# Patient Record
Sex: Female | Born: 1950 | Race: White | Hispanic: No | Marital: Married | Smoking: Former smoker
Health system: Southern US, Community
[De-identification: ages and names within clinical notes are randomized; demographics above are authoritative.]

## PROBLEM LIST (undated history)

## (undated) DIAGNOSIS — K219 Gastro-esophageal reflux disease without esophagitis: Secondary | ICD-10-CM

## (undated) DIAGNOSIS — I2510 Atherosclerotic heart disease of native coronary artery without angina pectoris: Secondary | ICD-10-CM

## (undated) DIAGNOSIS — F32A Depression, unspecified: Secondary | ICD-10-CM

## (undated) DIAGNOSIS — M51369 Other intervertebral disc degeneration, lumbar region without mention of lumbar back pain or lower extremity pain: Secondary | ICD-10-CM

## (undated) DIAGNOSIS — I700 Atherosclerosis of aorta: Secondary | ICD-10-CM

## (undated) DIAGNOSIS — F419 Anxiety disorder, unspecified: Secondary | ICD-10-CM

## (undated) DIAGNOSIS — I10 Essential (primary) hypertension: Secondary | ICD-10-CM

## (undated) DIAGNOSIS — E119 Type 2 diabetes mellitus without complications: Secondary | ICD-10-CM

## (undated) DIAGNOSIS — M5136 Other intervertebral disc degeneration, lumbar region: Secondary | ICD-10-CM

## (undated) DIAGNOSIS — R0600 Dyspnea, unspecified: Secondary | ICD-10-CM

## (undated) DIAGNOSIS — E669 Obesity, unspecified: Secondary | ICD-10-CM

## (undated) DIAGNOSIS — J45909 Unspecified asthma, uncomplicated: Secondary | ICD-10-CM

## (undated) DIAGNOSIS — C801 Malignant (primary) neoplasm, unspecified: Secondary | ICD-10-CM

## (undated) DIAGNOSIS — E785 Hyperlipidemia, unspecified: Secondary | ICD-10-CM

## (undated) HISTORY — PX: TONSILLECTOMY: SUR1361

## (undated) HISTORY — PX: BACK SURGERY: SHX140

---

## 2006-04-11 ENCOUNTER — Emergency Department: Payer: Self-pay | Admitting: Emergency Medicine

## 2006-04-13 ENCOUNTER — Ambulatory Visit: Payer: Self-pay | Admitting: Internal Medicine

## 2008-08-15 ENCOUNTER — Ambulatory Visit: Payer: Self-pay | Admitting: Internal Medicine

## 2008-08-26 ENCOUNTER — Ambulatory Visit: Payer: Self-pay | Admitting: Internal Medicine

## 2008-09-13 ENCOUNTER — Ambulatory Visit: Payer: Self-pay | Admitting: Neurology

## 2008-09-16 ENCOUNTER — Encounter: Admission: RE | Admit: 2008-09-16 | Discharge: 2008-09-16 | Payer: Self-pay | Admitting: Neurological Surgery

## 2008-09-19 ENCOUNTER — Ambulatory Visit (HOSPITAL_COMMUNITY): Admission: RE | Admit: 2008-09-19 | Discharge: 2008-09-20 | Payer: Self-pay | Admitting: Neurological Surgery

## 2008-10-08 ENCOUNTER — Encounter: Admission: RE | Admit: 2008-10-08 | Discharge: 2008-10-08 | Payer: Self-pay | Admitting: Neurological Surgery

## 2008-10-29 ENCOUNTER — Encounter: Admission: RE | Admit: 2008-10-29 | Discharge: 2008-10-29 | Payer: Self-pay | Admitting: Neurological Surgery

## 2008-11-26 ENCOUNTER — Encounter: Admission: RE | Admit: 2008-11-26 | Discharge: 2008-11-26 | Payer: Self-pay | Admitting: Neurological Surgery

## 2009-03-11 ENCOUNTER — Encounter: Admission: RE | Admit: 2009-03-11 | Discharge: 2009-03-11 | Payer: Self-pay | Admitting: Neurological Surgery

## 2009-06-23 ENCOUNTER — Encounter: Admission: RE | Admit: 2009-06-23 | Discharge: 2009-06-23 | Payer: Self-pay | Admitting: Neurological Surgery

## 2009-11-10 ENCOUNTER — Encounter: Admission: RE | Admit: 2009-11-10 | Discharge: 2009-11-10 | Payer: Self-pay | Admitting: Neurological Surgery

## 2011-02-16 NOTE — Op Note (Signed)
Olivia Buchanan, Olivia Buchanan                ACCOUNT NO.:  [id number]   MEDICAL RECORD NO.:  [id number]          PATIENT TYPE:  INP   LOCATION:  3101                         FACILITY:  [facility]   PHYSICIAN:  Tia Alert, MD     DATE OF BIRTH:  1951/04/17   DATE OF PROCEDURE:  09/19/2008  DATE OF DISCHARGE:                               OPERATIVE REPORT   PREOPERATIVE DIAGNOSIS:  Cervical spondylosis with cervical spinal  stenosis at C3-4, C4-5, and C5-6 with cord compression and myelopathy.   POSTOPERATIVE DIAGNOSIS:  Cervical spondylosis with cervical spinal  stenosis at C3-4, C4-5, and C5-6 with cord compression and myelopathy.   PROCEDURE:  1. Decompressive anterior cervical diskectomy at C3-4 for central      canal decompression.  2. C5 corpectomy after C4-5 and C5-6 diskectomies for central canal      decompression.  3. Anterior cervical arthrodesis at C3-4 utilizing a 8-mm Peak      interbody cage packed with local autograft and Actifuse putty.  4. Anterior cervical arthrodesis at C4-C6 utilizing a 24-mm carbon      fiber cage packed with local autograft and Actifuse putty.  5. Anterior cervical plating at C3-C6 utilizing an Atlantis      translational plate with anterior cervical screws in C3, C4, and C6  6. Microdissection during the anterior cervical diskectomy and      corpectomy.   SURGEON:  Tia Alert, MD   ASSISTANT:  Reinaldo Meeker, MD   ANESTHESIA:  General endotracheal.   COMPLICATIONS:  None apparent.   INDICATIONS FOR PROCEDURE:  Ms. Olivia Buchanan is a 60 year old female who was  referred with severe myelopathy.  She had a 79-month history of  progressive weakness in her hands with numbness in her hands, pain in  her neck, and difficulty with gait.  She was found to be hyperreflexic  and severely myelopathic with a severe spastic gait.  She had an MRI  which showed a large midline disk herniation at C3-4 with a large disk  herniation at C4-5 and C5-6 with compression  from the C4-5 disk space  all the way down to the C5-6 disk space behind the body of C5.  She had  some subluxed C3 on C4 and kyphotic angulation at those levels.  I  recommended anterior cervical diskectomy, fusion, and plating at C3-4  followed by corpectomy at C5.  She understood the risks, benefits, and  expected outcome and wished to proceed.   DESCRIPTION OF PROCEDURE:  The patient was taken to the operating room  and after induction of adequate general endotracheal anesthesia, she was  placed in the supine position on the operating room table.  Her right  anterior cervical region was prepped with DuraPrep and draped in the  usual sterile fashion.  A 5 mL of local anesthesia was injected and a  transverse incision was made to the right of midline and carried down to  the platysma which was elevated, opened, and undermined with Metzenbaum  scissors.  I then dissected a plane medial to the sternocleidomastoid  muscle and internal  carotid artery and lateral to the trachea and  esophagus to expose C3-4, C4-5, and C5-6.  Intraoperative fluoroscopy  confirmed my levels and the longus colli muscles were taken down and the  shadow line retractors were placed under this to expose C3-4, C4-5, and  C5-6.  I confirmed my levels once again and then incised the disk space  at C3-4 and did the initial diskectomy with pituitary rongeurs and  curved curettes.  I drilled down to the level of the posterior  longitudinal ligament saving the drill shavings and a mucous trap for  later arthrodesis.  The operating microscope was brought to the field  and the posterior longitudinal ligament was opened.  There was a large  midline disk herniation that was removed with a nerve hook and pituitary  rongeurs.  The posterior longitudinal ligament was opened and removed in  a circumferential fashion undercutting the bodies of C3 and C4.  Once  the dura was no longer pushed away from [place], we could see the cord   pulsatile through the dura.  I palpated it in a circumferential fashion  with a nerve hook to assure adequate decompression of the central canal.  The dura was full and capacious all the way across.  Therefore, I used  an 8-mm Peak interbody cage packed local autograft and Actifuse putty  and tapped this into position at C3-4.  I then turned my attention to  the corpectomy.  The disk spaces were incised at C4-5 and C5-6 and the  initial diskectomies done with care of curettes and pituitary rongeurs.  I then drilled the endplates down to the level of the posterior  longitudinal ligament at each level.  I then removed the anterior part  of the vertebral body with a Leksell rongeur and then used the high-  speed drill to completely drill out the remainder of the vertebral body  saving the drillings and a mucous trap for later arthrodesis.  I was  able to open the posterior longitudinal ligament and remove it while  removing the posterior part of the vertebral body of C5.  I was able to  undercut the endplates of C4 and C6.  There was a large disk herniation  at C5-6 with a moderate large disk herniation at C4-5.  I identified the  nerve roots bilaterally.  Bilateral foraminotomies were performed.  The  central canals were well decompressed.  You could see the cord pulsatile  through the dura and the dura was full and capacious all the way across  from the C4 vertebral body all the way down to C6.  I palpated it in a  circumferential fashion utilizing a nerve hook and therefore with both  visualization and palpation we felt like we had a good decompression of  the central canal at C4-5 and C5-6 and behind where the vertebral body  of C5 had been.  Therefore, I irrigated with saline solution and dried  the surgical bed.  I measured the interspace to be 24 mm and used a 24-  mm carbon cage, packed this with local autograft and Actifuse putty and  tapped it in position at C4-C6.  I then used an  Atlantis translational  plate.  I closed the lower 2 parts of the plate 1 mm so we only achieved  1 mm of translation.  I then placed the plate.  I placed two 30-mm  variable angle screws in the bodies of C6 and then the C3 and then the  C4 and then locked these into position with locking mechanism on the  plate.  I then irrigated with saline solution containing bacitracin,  dried all bleeding points with bipolar cautery and with Surgifoam,  irrigated again and once meticulous hemostasis was achieved, I placed a  #7 flat Blake drain through a separate stab incision and then closed the  platysma with 3-0 Vicryl, closed the subcuticular tissue with 3-0  Vicryl, and closed the skin with benzoin and Steri-Strips.  The drapes  were removed.  Sterile dressing was applied.  The patient was awakened  from general anesthesia and transferred to the recovery room in stable  condition.  At the end of the procedure, all sponge, needle, and  instrument counts were correct.      Tia Alert, MD  Electronically Signed     DSJ/MEDQ  D:  09/19/2008  T:  09/20/2008  Job:  [id number]

## 2011-07-09 LAB — URINALYSIS, ROUTINE W REFLEX MICROSCOPIC
Hgb urine dipstick: NEGATIVE
Nitrite: NEGATIVE
Protein, ur: NEGATIVE mg/dL
Specific Gravity, Urine: 1.027 (ref 1.005–1.030)
pH: 5.5 (ref 5.0–8.0)

## 2011-07-09 LAB — URINE CULTURE: Colony Count: >=100000

## 2011-07-09 LAB — URINE MICROSCOPIC-ADD ON

## 2011-07-09 LAB — CBC
Hemoglobin: 15.3 g/dL — ABNORMAL HIGH (ref 12.0–15.0)
MCV: 96.5 fL (ref 78.0–100.0)
Platelets: 186 10*3/uL (ref 150–400)
RBC: 4.71 MIL/uL (ref 3.87–5.11)
RDW: 13.3 % (ref 11.5–15.5)

## 2011-07-09 LAB — BASIC METABOLIC PANEL
CO2: 26 mEq/L (ref 19–32)
Potassium: 4.7 mEq/L (ref 3.5–5.1)
Sodium: 141 mEq/L (ref 135–145)

## 2011-07-09 LAB — APTT: aPTT: 26 seconds (ref 24–37)

## 2011-07-09 LAB — DIFFERENTIAL
Basophils Relative: 1 % (ref 0–1)
Lymphocytes Relative: 32 % (ref 12–46)
Neutro Abs: 5.4 10*3/uL (ref 1.7–7.7)
Neutrophils Relative %: 58 % (ref 43–77)

## 2011-07-09 LAB — PROTIME-INR
INR: 0.9 (ref 0.00–1.49)
Prothrombin Time: 12.2 seconds (ref 11.6–15.2)

## 2014-12-18 DIAGNOSIS — M549 Dorsalgia, unspecified: Secondary | ICD-10-CM | POA: Diagnosis not present

## 2014-12-18 DIAGNOSIS — F419 Anxiety disorder, unspecified: Secondary | ICD-10-CM | POA: Diagnosis not present

## 2014-12-18 DIAGNOSIS — E119 Type 2 diabetes mellitus without complications: Secondary | ICD-10-CM | POA: Diagnosis not present

## 2014-12-18 DIAGNOSIS — Z79899 Other long term (current) drug therapy: Secondary | ICD-10-CM | POA: Diagnosis not present

## 2014-12-18 DIAGNOSIS — F3341 Major depressive disorder, recurrent, in partial remission: Secondary | ICD-10-CM | POA: Diagnosis not present

## 2014-12-18 DIAGNOSIS — J449 Chronic obstructive pulmonary disease, unspecified: Secondary | ICD-10-CM | POA: Diagnosis not present

## 2014-12-18 DIAGNOSIS — E780 Pure hypercholesterolemia: Secondary | ICD-10-CM | POA: Diagnosis not present

## 2014-12-18 DIAGNOSIS — G8929 Other chronic pain: Secondary | ICD-10-CM | POA: Diagnosis not present

## 2015-02-04 DIAGNOSIS — Z01419 Encounter for gynecological examination (general) (routine) without abnormal findings: Secondary | ICD-10-CM | POA: Diagnosis not present

## 2015-02-11 DIAGNOSIS — N63 Unspecified lump in breast: Secondary | ICD-10-CM | POA: Diagnosis not present

## 2015-03-21 DIAGNOSIS — Z79899 Other long term (current) drug therapy: Secondary | ICD-10-CM | POA: Diagnosis not present

## 2015-03-21 DIAGNOSIS — E119 Type 2 diabetes mellitus without complications: Secondary | ICD-10-CM | POA: Diagnosis not present

## 2015-03-21 DIAGNOSIS — E780 Pure hypercholesterolemia: Secondary | ICD-10-CM | POA: Diagnosis not present

## 2015-03-21 DIAGNOSIS — S30861A Insect bite (nonvenomous) of abdominal wall, initial encounter: Secondary | ICD-10-CM | POA: Diagnosis not present

## 2015-03-21 DIAGNOSIS — J449 Chronic obstructive pulmonary disease, unspecified: Secondary | ICD-10-CM | POA: Diagnosis not present

## 2015-07-03 DIAGNOSIS — J431 Panlobular emphysema: Secondary | ICD-10-CM | POA: Diagnosis not present

## 2015-07-03 DIAGNOSIS — M549 Dorsalgia, unspecified: Secondary | ICD-10-CM | POA: Diagnosis not present

## 2015-07-03 DIAGNOSIS — Z1211 Encounter for screening for malignant neoplasm of colon: Secondary | ICD-10-CM | POA: Diagnosis not present

## 2015-07-03 DIAGNOSIS — K219 Gastro-esophageal reflux disease without esophagitis: Secondary | ICD-10-CM | POA: Diagnosis not present

## 2015-07-03 DIAGNOSIS — J209 Acute bronchitis, unspecified: Secondary | ICD-10-CM | POA: Diagnosis not present

## 2015-07-03 DIAGNOSIS — Z79899 Other long term (current) drug therapy: Secondary | ICD-10-CM | POA: Diagnosis not present

## 2015-07-03 DIAGNOSIS — E119 Type 2 diabetes mellitus without complications: Secondary | ICD-10-CM | POA: Diagnosis not present

## 2015-07-03 DIAGNOSIS — E780 Pure hypercholesterolemia: Secondary | ICD-10-CM | POA: Diagnosis not present

## 2015-10-02 DIAGNOSIS — E119 Type 2 diabetes mellitus without complications: Secondary | ICD-10-CM | POA: Diagnosis not present

## 2015-10-02 DIAGNOSIS — Z79899 Other long term (current) drug therapy: Secondary | ICD-10-CM | POA: Diagnosis not present

## 2015-10-02 DIAGNOSIS — M549 Dorsalgia, unspecified: Secondary | ICD-10-CM | POA: Diagnosis not present

## 2015-10-02 DIAGNOSIS — M545 Low back pain: Secondary | ICD-10-CM | POA: Diagnosis not present

## 2015-10-02 DIAGNOSIS — E7800 Pure hypercholesterolemia, unspecified: Secondary | ICD-10-CM | POA: Diagnosis not present

## 2015-10-02 DIAGNOSIS — G8929 Other chronic pain: Secondary | ICD-10-CM | POA: Diagnosis not present

## 2015-10-02 DIAGNOSIS — J431 Panlobular emphysema: Secondary | ICD-10-CM | POA: Diagnosis not present

## 2015-10-02 DIAGNOSIS — F419 Anxiety disorder, unspecified: Secondary | ICD-10-CM | POA: Diagnosis not present

## 2015-12-31 DIAGNOSIS — F419 Anxiety disorder, unspecified: Secondary | ICD-10-CM | POA: Diagnosis not present

## 2015-12-31 DIAGNOSIS — E7800 Pure hypercholesterolemia, unspecified: Secondary | ICD-10-CM | POA: Diagnosis not present

## 2015-12-31 DIAGNOSIS — E119 Type 2 diabetes mellitus without complications: Secondary | ICD-10-CM | POA: Diagnosis not present

## 2015-12-31 DIAGNOSIS — J431 Panlobular emphysema: Secondary | ICD-10-CM | POA: Diagnosis not present

## 2015-12-31 DIAGNOSIS — M545 Low back pain: Secondary | ICD-10-CM | POA: Diagnosis not present

## 2015-12-31 DIAGNOSIS — I10 Essential (primary) hypertension: Secondary | ICD-10-CM | POA: Diagnosis not present

## 2015-12-31 DIAGNOSIS — M549 Dorsalgia, unspecified: Secondary | ICD-10-CM | POA: Diagnosis not present

## 2015-12-31 DIAGNOSIS — Z79899 Other long term (current) drug therapy: Secondary | ICD-10-CM | POA: Diagnosis not present

## 2015-12-31 DIAGNOSIS — G8929 Other chronic pain: Secondary | ICD-10-CM | POA: Diagnosis not present

## 2016-04-05 DIAGNOSIS — G8929 Other chronic pain: Secondary | ICD-10-CM | POA: Diagnosis not present

## 2016-04-05 DIAGNOSIS — F329 Major depressive disorder, single episode, unspecified: Secondary | ICD-10-CM | POA: Diagnosis not present

## 2016-04-05 DIAGNOSIS — J431 Panlobular emphysema: Secondary | ICD-10-CM | POA: Diagnosis not present

## 2016-04-05 DIAGNOSIS — M549 Dorsalgia, unspecified: Secondary | ICD-10-CM | POA: Diagnosis not present

## 2016-04-05 DIAGNOSIS — M545 Low back pain: Secondary | ICD-10-CM | POA: Diagnosis not present

## 2016-04-05 DIAGNOSIS — E119 Type 2 diabetes mellitus without complications: Secondary | ICD-10-CM | POA: Diagnosis not present

## 2016-04-05 DIAGNOSIS — F419 Anxiety disorder, unspecified: Secondary | ICD-10-CM | POA: Diagnosis not present

## 2016-04-05 DIAGNOSIS — E7800 Pure hypercholesterolemia, unspecified: Secondary | ICD-10-CM | POA: Diagnosis not present

## 2016-07-05 DIAGNOSIS — R8290 Unspecified abnormal findings in urine: Secondary | ICD-10-CM | POA: Diagnosis not present

## 2016-07-05 DIAGNOSIS — E119 Type 2 diabetes mellitus without complications: Secondary | ICD-10-CM | POA: Diagnosis not present

## 2016-07-05 DIAGNOSIS — E7800 Pure hypercholesterolemia, unspecified: Secondary | ICD-10-CM | POA: Diagnosis not present

## 2016-07-05 DIAGNOSIS — Z79899 Other long term (current) drug therapy: Secondary | ICD-10-CM | POA: Diagnosis not present

## 2016-07-08 DIAGNOSIS — Z1231 Encounter for screening mammogram for malignant neoplasm of breast: Secondary | ICD-10-CM | POA: Diagnosis not present

## 2016-07-08 DIAGNOSIS — M545 Low back pain: Secondary | ICD-10-CM | POA: Diagnosis not present

## 2016-07-08 DIAGNOSIS — F419 Anxiety disorder, unspecified: Secondary | ICD-10-CM | POA: Diagnosis not present

## 2016-07-08 DIAGNOSIS — M549 Dorsalgia, unspecified: Secondary | ICD-10-CM | POA: Diagnosis not present

## 2016-07-08 DIAGNOSIS — J449 Chronic obstructive pulmonary disease, unspecified: Secondary | ICD-10-CM | POA: Diagnosis not present

## 2016-07-08 DIAGNOSIS — Z0000 Encounter for general adult medical examination without abnormal findings: Secondary | ICD-10-CM | POA: Diagnosis not present

## 2016-07-08 DIAGNOSIS — E7800 Pure hypercholesterolemia, unspecified: Secondary | ICD-10-CM | POA: Diagnosis not present

## 2016-07-08 DIAGNOSIS — E119 Type 2 diabetes mellitus without complications: Secondary | ICD-10-CM | POA: Diagnosis not present

## 2016-07-08 DIAGNOSIS — J209 Acute bronchitis, unspecified: Secondary | ICD-10-CM | POA: Diagnosis not present

## 2016-07-08 DIAGNOSIS — J431 Panlobular emphysema: Secondary | ICD-10-CM | POA: Diagnosis not present

## 2016-07-19 DIAGNOSIS — Z1211 Encounter for screening for malignant neoplasm of colon: Secondary | ICD-10-CM | POA: Diagnosis not present

## 2016-10-06 DIAGNOSIS — E119 Type 2 diabetes mellitus without complications: Secondary | ICD-10-CM | POA: Diagnosis not present

## 2016-10-06 DIAGNOSIS — F3341 Major depressive disorder, recurrent, in partial remission: Secondary | ICD-10-CM | POA: Diagnosis not present

## 2016-10-06 DIAGNOSIS — F419 Anxiety disorder, unspecified: Secondary | ICD-10-CM | POA: Diagnosis not present

## 2016-10-06 DIAGNOSIS — G8929 Other chronic pain: Secondary | ICD-10-CM | POA: Diagnosis not present

## 2016-10-06 DIAGNOSIS — J431 Panlobular emphysema: Secondary | ICD-10-CM | POA: Diagnosis not present

## 2016-10-06 DIAGNOSIS — E7800 Pure hypercholesterolemia, unspecified: Secondary | ICD-10-CM | POA: Diagnosis not present

## 2016-10-06 DIAGNOSIS — F418 Other specified anxiety disorders: Secondary | ICD-10-CM | POA: Diagnosis not present

## 2016-10-06 DIAGNOSIS — M545 Low back pain: Secondary | ICD-10-CM | POA: Diagnosis not present

## 2016-10-06 DIAGNOSIS — M549 Dorsalgia, unspecified: Secondary | ICD-10-CM | POA: Diagnosis not present

## 2017-01-20 DIAGNOSIS — G8929 Other chronic pain: Secondary | ICD-10-CM | POA: Diagnosis not present

## 2017-01-20 DIAGNOSIS — E7800 Pure hypercholesterolemia, unspecified: Secondary | ICD-10-CM | POA: Diagnosis not present

## 2017-01-20 DIAGNOSIS — M545 Low back pain: Secondary | ICD-10-CM | POA: Diagnosis not present

## 2017-01-20 DIAGNOSIS — J431 Panlobular emphysema: Secondary | ICD-10-CM | POA: Diagnosis not present

## 2017-01-20 DIAGNOSIS — E119 Type 2 diabetes mellitus without complications: Secondary | ICD-10-CM | POA: Diagnosis not present

## 2017-01-20 DIAGNOSIS — M549 Dorsalgia, unspecified: Secondary | ICD-10-CM | POA: Diagnosis not present

## 2017-01-20 DIAGNOSIS — Z79899 Other long term (current) drug therapy: Secondary | ICD-10-CM | POA: Diagnosis not present

## 2017-04-21 DIAGNOSIS — F419 Anxiety disorder, unspecified: Secondary | ICD-10-CM | POA: Diagnosis not present

## 2017-04-21 DIAGNOSIS — J431 Panlobular emphysema: Secondary | ICD-10-CM | POA: Diagnosis not present

## 2017-04-21 DIAGNOSIS — E7800 Pure hypercholesterolemia, unspecified: Secondary | ICD-10-CM | POA: Diagnosis not present

## 2017-04-21 DIAGNOSIS — E119 Type 2 diabetes mellitus without complications: Secondary | ICD-10-CM | POA: Diagnosis not present

## 2017-04-21 DIAGNOSIS — G8929 Other chronic pain: Secondary | ICD-10-CM | POA: Diagnosis not present

## 2017-04-21 DIAGNOSIS — Z0000 Encounter for general adult medical examination without abnormal findings: Secondary | ICD-10-CM | POA: Diagnosis not present

## 2017-04-21 DIAGNOSIS — M545 Low back pain: Secondary | ICD-10-CM | POA: Diagnosis not present

## 2017-04-21 DIAGNOSIS — F3341 Major depressive disorder, recurrent, in partial remission: Secondary | ICD-10-CM | POA: Diagnosis not present

## 2017-05-31 DIAGNOSIS — J431 Panlobular emphysema: Secondary | ICD-10-CM | POA: Diagnosis not present

## 2017-05-31 DIAGNOSIS — G8929 Other chronic pain: Secondary | ICD-10-CM | POA: Diagnosis not present

## 2017-05-31 DIAGNOSIS — F3341 Major depressive disorder, recurrent, in partial remission: Secondary | ICD-10-CM | POA: Diagnosis not present

## 2017-05-31 DIAGNOSIS — E7800 Pure hypercholesterolemia, unspecified: Secondary | ICD-10-CM | POA: Diagnosis not present

## 2017-05-31 DIAGNOSIS — M545 Low back pain: Secondary | ICD-10-CM | POA: Diagnosis not present

## 2017-05-31 DIAGNOSIS — E119 Type 2 diabetes mellitus without complications: Secondary | ICD-10-CM | POA: Diagnosis not present

## 2017-05-31 DIAGNOSIS — Z79899 Other long term (current) drug therapy: Secondary | ICD-10-CM | POA: Diagnosis not present

## 2017-05-31 DIAGNOSIS — Z1231 Encounter for screening mammogram for malignant neoplasm of breast: Secondary | ICD-10-CM | POA: Diagnosis not present

## 2017-05-31 DIAGNOSIS — F419 Anxiety disorder, unspecified: Secondary | ICD-10-CM | POA: Diagnosis not present

## 2017-07-27 DIAGNOSIS — Z1231 Encounter for screening mammogram for malignant neoplasm of breast: Secondary | ICD-10-CM | POA: Diagnosis not present

## 2017-08-29 DIAGNOSIS — E7800 Pure hypercholesterolemia, unspecified: Secondary | ICD-10-CM | POA: Diagnosis not present

## 2017-08-29 DIAGNOSIS — Z79899 Other long term (current) drug therapy: Secondary | ICD-10-CM | POA: Diagnosis not present

## 2017-08-29 DIAGNOSIS — E119 Type 2 diabetes mellitus without complications: Secondary | ICD-10-CM | POA: Diagnosis not present

## 2017-08-31 DIAGNOSIS — M545 Low back pain: Secondary | ICD-10-CM | POA: Diagnosis not present

## 2017-08-31 DIAGNOSIS — E119 Type 2 diabetes mellitus without complications: Secondary | ICD-10-CM | POA: Diagnosis not present

## 2017-08-31 DIAGNOSIS — G8929 Other chronic pain: Secondary | ICD-10-CM | POA: Diagnosis not present

## 2017-08-31 DIAGNOSIS — J431 Panlobular emphysema: Secondary | ICD-10-CM | POA: Diagnosis not present

## 2017-08-31 DIAGNOSIS — E7800 Pure hypercholesterolemia, unspecified: Secondary | ICD-10-CM | POA: Diagnosis not present

## 2017-12-01 DIAGNOSIS — I10 Essential (primary) hypertension: Secondary | ICD-10-CM | POA: Diagnosis not present

## 2017-12-01 DIAGNOSIS — E119 Type 2 diabetes mellitus without complications: Secondary | ICD-10-CM | POA: Diagnosis not present

## 2017-12-01 DIAGNOSIS — M545 Low back pain: Secondary | ICD-10-CM | POA: Diagnosis not present

## 2017-12-01 DIAGNOSIS — K219 Gastro-esophageal reflux disease without esophagitis: Secondary | ICD-10-CM | POA: Diagnosis not present

## 2017-12-01 DIAGNOSIS — F419 Anxiety disorder, unspecified: Secondary | ICD-10-CM | POA: Diagnosis not present

## 2017-12-01 DIAGNOSIS — Z0000 Encounter for general adult medical examination without abnormal findings: Secondary | ICD-10-CM | POA: Diagnosis not present

## 2017-12-01 DIAGNOSIS — M549 Dorsalgia, unspecified: Secondary | ICD-10-CM | POA: Diagnosis not present

## 2017-12-01 DIAGNOSIS — J431 Panlobular emphysema: Secondary | ICD-10-CM | POA: Diagnosis not present

## 2017-12-01 DIAGNOSIS — E7800 Pure hypercholesterolemia, unspecified: Secondary | ICD-10-CM | POA: Diagnosis not present

## 2018-02-28 DIAGNOSIS — M549 Dorsalgia, unspecified: Secondary | ICD-10-CM | POA: Diagnosis not present

## 2018-02-28 DIAGNOSIS — E7800 Pure hypercholesterolemia, unspecified: Secondary | ICD-10-CM | POA: Diagnosis not present

## 2018-02-28 DIAGNOSIS — G8929 Other chronic pain: Secondary | ICD-10-CM | POA: Diagnosis not present

## 2018-02-28 DIAGNOSIS — Z79899 Other long term (current) drug therapy: Secondary | ICD-10-CM | POA: Diagnosis not present

## 2018-02-28 DIAGNOSIS — M545 Low back pain: Secondary | ICD-10-CM | POA: Diagnosis not present

## 2018-02-28 DIAGNOSIS — E119 Type 2 diabetes mellitus without complications: Secondary | ICD-10-CM | POA: Diagnosis not present

## 2018-02-28 DIAGNOSIS — Z1211 Encounter for screening for malignant neoplasm of colon: Secondary | ICD-10-CM | POA: Diagnosis not present

## 2018-02-28 DIAGNOSIS — J449 Chronic obstructive pulmonary disease, unspecified: Secondary | ICD-10-CM | POA: Diagnosis not present

## 2018-02-28 DIAGNOSIS — J431 Panlobular emphysema: Secondary | ICD-10-CM | POA: Diagnosis not present

## 2018-05-31 DIAGNOSIS — J431 Panlobular emphysema: Secondary | ICD-10-CM | POA: Diagnosis not present

## 2018-05-31 DIAGNOSIS — Z79899 Other long term (current) drug therapy: Secondary | ICD-10-CM | POA: Diagnosis not present

## 2018-05-31 DIAGNOSIS — G8929 Other chronic pain: Secondary | ICD-10-CM | POA: Diagnosis not present

## 2018-05-31 DIAGNOSIS — E119 Type 2 diabetes mellitus without complications: Secondary | ICD-10-CM | POA: Diagnosis not present

## 2018-05-31 DIAGNOSIS — M545 Low back pain: Secondary | ICD-10-CM | POA: Diagnosis not present

## 2018-05-31 DIAGNOSIS — E7800 Pure hypercholesterolemia, unspecified: Secondary | ICD-10-CM | POA: Diagnosis not present

## 2018-08-30 DIAGNOSIS — Z79899 Other long term (current) drug therapy: Secondary | ICD-10-CM | POA: Diagnosis not present

## 2018-08-30 DIAGNOSIS — E119 Type 2 diabetes mellitus without complications: Secondary | ICD-10-CM | POA: Diagnosis not present

## 2018-08-30 DIAGNOSIS — G8929 Other chronic pain: Secondary | ICD-10-CM | POA: Diagnosis not present

## 2018-08-30 DIAGNOSIS — E7800 Pure hypercholesterolemia, unspecified: Secondary | ICD-10-CM | POA: Diagnosis not present

## 2018-08-30 DIAGNOSIS — M545 Low back pain: Secondary | ICD-10-CM | POA: Diagnosis not present

## 2018-08-30 DIAGNOSIS — J431 Panlobular emphysema: Secondary | ICD-10-CM | POA: Diagnosis not present

## 2019-03-16 DIAGNOSIS — Z0000 Encounter for general adult medical examination without abnormal findings: Secondary | ICD-10-CM | POA: Diagnosis not present

## 2019-03-16 DIAGNOSIS — I10 Essential (primary) hypertension: Secondary | ICD-10-CM | POA: Diagnosis not present

## 2019-03-16 DIAGNOSIS — E1165 Type 2 diabetes mellitus with hyperglycemia: Secondary | ICD-10-CM | POA: Diagnosis not present

## 2019-03-16 DIAGNOSIS — F419 Anxiety disorder, unspecified: Secondary | ICD-10-CM | POA: Diagnosis not present

## 2019-03-16 DIAGNOSIS — J431 Panlobular emphysema: Secondary | ICD-10-CM | POA: Diagnosis not present

## 2019-03-16 DIAGNOSIS — Z79899 Other long term (current) drug therapy: Secondary | ICD-10-CM | POA: Diagnosis not present

## 2019-03-16 DIAGNOSIS — E7800 Pure hypercholesterolemia, unspecified: Secondary | ICD-10-CM | POA: Diagnosis not present

## 2019-03-16 DIAGNOSIS — M549 Dorsalgia, unspecified: Secondary | ICD-10-CM | POA: Diagnosis not present

## 2019-03-16 DIAGNOSIS — Z1211 Encounter for screening for malignant neoplasm of colon: Secondary | ICD-10-CM | POA: Diagnosis not present

## 2019-07-05 DIAGNOSIS — E119 Type 2 diabetes mellitus without complications: Secondary | ICD-10-CM | POA: Diagnosis not present

## 2019-07-05 DIAGNOSIS — Z135 Encounter for screening for eye and ear disorders: Secondary | ICD-10-CM | POA: Diagnosis not present

## 2019-07-05 DIAGNOSIS — Z79899 Other long term (current) drug therapy: Secondary | ICD-10-CM | POA: Diagnosis not present

## 2019-07-05 DIAGNOSIS — I10 Essential (primary) hypertension: Secondary | ICD-10-CM | POA: Diagnosis not present

## 2019-07-05 DIAGNOSIS — E785 Hyperlipidemia, unspecified: Secondary | ICD-10-CM | POA: Diagnosis not present

## 2019-07-05 DIAGNOSIS — R413 Other amnesia: Secondary | ICD-10-CM | POA: Diagnosis not present

## 2019-07-05 DIAGNOSIS — Z0000 Encounter for general adult medical examination without abnormal findings: Secondary | ICD-10-CM | POA: Diagnosis not present

## 2019-10-19 DIAGNOSIS — J449 Chronic obstructive pulmonary disease, unspecified: Secondary | ICD-10-CM | POA: Diagnosis not present

## 2019-10-19 DIAGNOSIS — I10 Essential (primary) hypertension: Secondary | ICD-10-CM | POA: Diagnosis not present

## 2019-10-19 DIAGNOSIS — Z0000 Encounter for general adult medical examination without abnormal findings: Secondary | ICD-10-CM | POA: Diagnosis not present

## 2019-10-19 DIAGNOSIS — E119 Type 2 diabetes mellitus without complications: Secondary | ICD-10-CM | POA: Diagnosis not present

## 2019-10-19 DIAGNOSIS — E785 Hyperlipidemia, unspecified: Secondary | ICD-10-CM | POA: Diagnosis not present

## 2019-10-19 DIAGNOSIS — Z79899 Other long term (current) drug therapy: Secondary | ICD-10-CM | POA: Diagnosis not present

## 2019-12-07 ENCOUNTER — Other Ambulatory Visit: Payer: Self-pay

## 2019-12-07 ENCOUNTER — Observation Stay
Admission: EM | Admit: 2019-12-07 | Discharge: 2019-12-08 | Disposition: A | Discharge status: Home / Self Care | Payer: Medicare HMO | Attending: Internal Medicine | Admitting: Internal Medicine

## 2019-12-07 ENCOUNTER — Emergency Department: Payer: Medicare HMO

## 2019-12-07 DIAGNOSIS — X58XXXA Exposure to other specified factors, initial encounter: Secondary | ICD-10-CM | POA: Diagnosis not present

## 2019-12-07 DIAGNOSIS — M542 Cervicalgia: Secondary | ICD-10-CM | POA: Diagnosis not present

## 2019-12-07 DIAGNOSIS — Z20822 Contact with and (suspected) exposure to covid-19: Secondary | ICD-10-CM | POA: Insufficient documentation

## 2019-12-07 DIAGNOSIS — J449 Chronic obstructive pulmonary disease, unspecified: Secondary | ICD-10-CM | POA: Insufficient documentation

## 2019-12-07 DIAGNOSIS — Z87891 Personal history of nicotine dependence: Secondary | ICD-10-CM | POA: Insufficient documentation

## 2019-12-07 DIAGNOSIS — T782XXA Anaphylactic shock, unspecified, initial encounter: Secondary | ICD-10-CM | POA: Diagnosis not present

## 2019-12-07 DIAGNOSIS — Z79899 Other long term (current) drug therapy: Secondary | ICD-10-CM | POA: Diagnosis not present

## 2019-12-07 DIAGNOSIS — R0902 Hypoxemia: Secondary | ICD-10-CM | POA: Diagnosis not present

## 2019-12-07 DIAGNOSIS — I10 Essential (primary) hypertension: Secondary | ICD-10-CM | POA: Diagnosis not present

## 2019-12-07 DIAGNOSIS — G8929 Other chronic pain: Secondary | ICD-10-CM | POA: Diagnosis not present

## 2019-12-07 DIAGNOSIS — M5136 Other intervertebral disc degeneration, lumbar region: Secondary | ICD-10-CM | POA: Insufficient documentation

## 2019-12-07 DIAGNOSIS — R609 Edema, unspecified: Secondary | ICD-10-CM | POA: Diagnosis not present

## 2019-12-07 DIAGNOSIS — E119 Type 2 diabetes mellitus without complications: Secondary | ICD-10-CM | POA: Diagnosis not present

## 2019-12-07 DIAGNOSIS — R1310 Dysphagia, unspecified: Secondary | ICD-10-CM | POA: Diagnosis not present

## 2019-12-07 DIAGNOSIS — T783XXA Angioneurotic edema, initial encounter: Secondary | ICD-10-CM | POA: Diagnosis not present

## 2019-12-07 DIAGNOSIS — R519 Headache, unspecified: Secondary | ICD-10-CM | POA: Diagnosis not present

## 2019-12-07 HISTORY — DX: Other intervertebral disc degeneration, lumbar region without mention of lumbar back pain or lower extremity pain: M51.369

## 2019-12-07 HISTORY — DX: Essential (primary) hypertension: I10

## 2019-12-07 HISTORY — DX: Other intervertebral disc degeneration, lumbar region: M51.36

## 2019-12-07 HISTORY — DX: Type 2 diabetes mellitus without complications: E11.9

## 2019-12-07 MED ORDER — EPINEPHRINE 0.3 MG/0.3ML IJ SOAJ
0.3000 mg | Freq: Once | INTRAMUSCULAR | Status: AC
  Administered 2019-12-07: 0.3 mg via INTRAMUSCULAR
  Filled 2019-12-07: qty 0.3

## 2019-12-07 MED ORDER — MORPHINE SULFATE (PF) 2 MG/ML IV SOLN
2.0000 mg | Freq: Once | INTRAVENOUS | Status: AC
  Administered 2019-12-07: 2 mg via INTRAVENOUS
  Filled 2019-12-07: qty 1

## 2019-12-07 NOTE — ED Triage Notes (Signed)
PT to ED via EMS from [place]. While eating pt had an allergic reaction to unknown  Allergen. PT had neck swelling and trouble breathing. PT was given 0.3mg  EPI 50mg  benadryl 125 solumedrol 20mg  famatodine  0.5mg  epi PT states her lips are "still a little bit" swollen, tongue "has gone down", but still a little trouble swallowing. PT talkingin full sentences.

## 2019-12-07 NOTE — ED Provider Notes (Signed)
[facility] Emergency Department Provider Note   ____________________________________________   First MD Initiated Contact with Patient 12/07/19 2116     (approximate)  I have reviewed the triage vital signs and the nursing notes.   HISTORY  Chief Complaint Allergic Reaction    HPI Olivia Buchanan is a 69 y.o. female presents for evaluation for concerning allergic reaction  Patient reports just prior to arrival she was eating at a [place], she suddenly began to experience swelling and difficulty swallowing.  She reports her face and tongue became very swollen.  They called EMS, she was administered 2 doses of epinephrine, IV steroid, IV Benadryl, and IV famotidine  She reports the swelling has improved, her breathing has improved, but she still feels like her tongue is somewhat swollen and it still difficult for her to swallow her face still does feel somewhat swollen as well  She denies trouble breathing at this time.  Reports she had significant trouble breathing when this occurred  She is not aware of any food allergies, but reports the same thing happened to her once before while actually dieting and eating at a [employer]  She does not take any "Prill" medications   Past Medical History:  Diagnosis Date  . DDD (degenerative disc disease), lumbar   . Diabetes mellitus without complication ([place])   . Hypertension     There are no problems to display for this patient.   Past Surgical History:  Procedure Laterality Date  . BACK SURGERY      Prior to Admission medications   Not on File    Allergies Macrobid [nitrofurantoin] and Penicillins  No family history on file.  Social History Social History   Tobacco Use  . Smoking status: Former Smoker    Years: 50.00    Quit date: 2019    Years since quitting: 2.1  . Smokeless tobacco: Never Used  Substance Use Topics  . Alcohol use: Never  . Drug use: Never     Review of Systems Constitutional: No fever/chills Eyes: No visual changes. ENT: See HPI Cardiovascular: Denies chest pain. Respiratory: Denies shortness of breath. Gastrointestinal: No abdominal pain.   Genitourinary: Negative for dysuria. Musculoskeletal: Negative for back pain. Skin: Negative for rash. Neurological: Negative for areas of focal weakness or numbness.  Reports that about the time the swelling started or after she got treatment by EMS, not quite sure she started having a severe throbbing headache.  Reports its not the worst headache she has ever had but it is pretty severe.  Bifrontal throbbing in nature.  No weakness, no visual changes.  ____________________________________________   PHYSICAL EXAM:  VITAL SIGNS: ED Triage Vitals  Enc Vitals Group     BP 12/07/19 2104 136/65     Pulse Rate 12/07/19 2104 83     Resp 12/07/19 2104 17     Temp 12/07/19 2104 97.9 F (36.6 C)     Temp Source 12/07/19 2104 Oral     SpO2 12/07/19 2051 95 %     Weight 12/07/19 2057 188 lb (85.3 kg)     Height 12/07/19 2057 5' (1.524 m)     Head Circumference --      Peak Flow --      Pain Score 12/07/19 2056 5     Pain Loc --      Pain Edu? --      Excl. in [place]? --     Constitutional: Alert and oriented. Well  appearing and in no acute distress. Eyes: Conjunctivae are normal. Head: Atraumatic but she does exhibit mild periorbital and perioral edema, some edema over the cheeks, her tongue appears just slightly edematous, she is handling her secretions and able to swallow but her voice does seem somewhat raspy.  She does not exhibit any obvious edema in the posterior oropharynx.. Nose: No congestion/rhinnorhea. Mouth/Throat: Mucous membranes are moist. Neck: No stridor.  Cardiovascular: Normal rate, regular rhythm. Grossly normal heart sounds.  Good peripheral circulation. Respiratory: Normal respiratory effort.  No retractions. Lungs CTAB. Gastrointestinal: Soft and nontender.  No distention. Musculoskeletal: No lower extremity tenderness nor edema. Neurologic:  Normal speech and language. No gross focal neurologic deficits are appreciated.  She reports a headache. Skin:  Skin is warm, dry and intact. No rash noted. Psychiatric: Mood and affect are normal. Speech and behavior are normal.  ____________________________________________   LABS (all labs ordered are listed, but only abnormal results are displayed)  Labs Reviewed  CBG MONITORING, ED   ____________________________________________  EKG  Reviewed entered by me at 2100 Heart rate 90 QRS 90 QTc 450 Normal sinus rhythm, mild artifact.  No evidence of acute ischemia ____________________________________________  RADIOLOGY  CT Head Wo Contrast  Result Date: 12/07/2019 CLINICAL DATA:  Headache. Headache, tension-type severe headache after epinephrine administration Allergic reaction with neck swelling and difficulty breathing. EXAM: CT HEAD WITHOUT CONTRAST TECHNIQUE: Contiguous axial images were obtained from the base of the skull through the vertex without intravenous contrast. COMPARISON:  None. FINDINGS: Brain: No intracranial hemorrhage, mass effect, or midline shift. Brain volume is normal for age. No hydrocephalus. The basilar cisterns are patent. Mild periventricular and deep white matter hypodensity most consistent with chronic small vessel ischemia. Remote lacunar infarct versus prominent perivascular space in the left basal ganglia. No evidence of territorial infarct or acute ischemia. No extra-axial or intracranial fluid collection. Vascular: Atherosclerosis of skullbase vasculature without hyperdense vessel or abnormal calcification. Skull: No fracture or focal lesion. Sinuses/Orbits: Paranasal sinuses and mastoid air cells are clear. The visualized orbits are unremarkable. Other: None. IMPRESSION: 1. No acute intracranial abnormality. 2. Mild chronic small vessel ischemia. Electronically Signed    By: Keith Rake M.D.   On: 12/07/2019 22:27     Reviewed CT of the head, negative for acute.  No hemorrhage ____________________________________________   PROCEDURES  Procedure(s) performed: None  Procedures  Critical Care performed: Yes, see critical care note(s)  CRITICAL CARE Performed by: Delman Kitten   Total critical care time: 30 minutes  Critical care time was exclusive of separately billable procedures and treating other patients.  Critical care was necessary to treat or prevent imminent or life-threatening deterioration.  Critical care was time spent personally by me on the following activities: development of treatment plan with patient and/or surrogate as well as nursing, discussions with consultants, evaluation of patient's response to treatment, examination of patient, obtaining history from patient or surrogate, ordering and performing treatments and interventions, ordering and review of laboratory studies, ordering and review of radiographic studies, pulse oximetry and re-evaluation of patient's condition.  ____________________________________________   INITIAL IMPRESSION / ASSESSMENT AND PLAN / ED COURSE  Pertinent labs & imaging results that were available during my care of the patient were reviewed by me and considered in my medical decision making (see chart for details).       Patient presents for severe angioedema following what appears to be likely an allergic reaction.  Associated with a fairly severe headache, I suspect somehow related to  treatment with epinephrine though she is not quite sure if the headache preceded EMS arrival or not.  She reports similar symptoms occurring once before having eaten [place] food as well.  She appears to have had a pretty severe case of angioedema, that is now improving but still showing evidence of angioedema of the tongue, some difficulty with raspiness of voice and she reports a feeling of fullness in the throat.   Also swelling over the front of the face is mild at this time.  However, I am concerned about pretty severe ongoing reaction, and will trial additional dose of epinephrine intramuscular as we monitor her very closely.  Regarding her headache will obtain a CT of the head to evaluate for acute cause such as ruptured aneurysm, though I doubt this but air on the side of safety given the report of pretty severe headache as well, likely associated with some of the medications or treatment she was given I suspect but not quite sure  No associated cardiac or pulmonary symptoms at this time.  ----------------------------------------- 11:29 PM on 12/07/2019 -----------------------------------------  Patient does report her swallowing has improved.  Her breathing has improved.  She does feel better, she does remain swollen around her lips and face, but this appears to be stable from her initial exam and she reports less pharyngeal symptoms or tongue swelling.  Given the need for 3 epinephrines, and full treatment with steroids and antihistamines and still ongoing swelling I discussed with the patient we will admit her for observation to the hospitalist service.  ____________________________________________   FINAL CLINICAL IMPRESSION(S) / ED DIAGNOSES  Final diagnoses:  Anaphylaxis, initial encounter        Note:  This document was prepared using Dragon voice recognition software and may include unintentional dictation errors       Delman Kitten, MD 12/07/19 2330

## 2019-12-08 ENCOUNTER — Encounter: Payer: Self-pay | Admitting: Obstetrics and Gynecology

## 2019-12-08 DIAGNOSIS — I10 Essential (primary) hypertension: Secondary | ICD-10-CM | POA: Diagnosis not present

## 2019-12-08 DIAGNOSIS — E119 Type 2 diabetes mellitus without complications: Secondary | ICD-10-CM

## 2019-12-08 DIAGNOSIS — T783XXD Angioneurotic edema, subsequent encounter: Secondary | ICD-10-CM

## 2019-12-08 DIAGNOSIS — J449 Chronic obstructive pulmonary disease, unspecified: Secondary | ICD-10-CM

## 2019-12-08 DIAGNOSIS — T783XXA Angioneurotic edema, initial encounter: Secondary | ICD-10-CM | POA: Diagnosis not present

## 2019-12-08 HISTORY — DX: Essential (primary) hypertension: I10

## 2019-12-08 HISTORY — DX: Angioneurotic edema, initial encounter: T78.3XXA

## 2019-12-08 HISTORY — DX: Type 2 diabetes mellitus without complications: E11.9

## 2019-12-08 HISTORY — DX: Chronic obstructive pulmonary disease, unspecified: J44.9

## 2019-12-08 LAB — GLUCOSE, CAPILLARY: Glucose-Capillary: 235 mg/dL — ABNORMAL HIGH (ref 70–99)

## 2019-12-08 LAB — HIV ANTIBODY (ROUTINE TESTING W REFLEX): HIV Screen 4th Generation wRfx: NONREACTIVE

## 2019-12-08 LAB — SARS CORONAVIRUS 2 (TAT 6-24 HRS): SARS Coronavirus 2: NEGATIVE

## 2019-12-08 MED ORDER — PREDNISONE 20 MG PO TABS
40.0000 mg | ORAL_TABLET | Freq: Every day | ORAL | Status: DC
  Administered 2019-12-08: 40 mg via ORAL
  Filled 2019-12-08: qty 2

## 2019-12-08 MED ORDER — LORATADINE 10 MG PO TABS
10.0000 mg | ORAL_TABLET | Freq: Every day | ORAL | Status: DC
  Administered 2019-12-08: 10 mg via ORAL
  Filled 2019-12-08: qty 1

## 2019-12-08 MED ORDER — GABAPENTIN 300 MG PO CAPS
300.0000 mg | ORAL_CAPSULE | Freq: Three times a day (TID) | ORAL | Status: DC
  Administered 2019-12-08: 300 mg via ORAL
  Filled 2019-12-08: qty 1

## 2019-12-08 MED ORDER — SODIUM CHLORIDE 0.9% FLUSH: 3.0000 mL | INTRAVENOUS | Status: DC | PRN

## 2019-12-08 MED ORDER — ASPIRIN EC 81 MG PO TBEC: 81.0000 mg | DELAYED_RELEASE_TABLET | Freq: Every day | ORAL | Status: DC

## 2019-12-08 MED ORDER — EPINEPHRINE 0.3 MG/0.3ML IJ SOAJ: 0.3000 mg | INTRAMUSCULAR | 0 refills | Status: DC | PRN

## 2019-12-08 MED ORDER — TIZANIDINE HCL 4 MG PO TABS
4.0000 mg | ORAL_TABLET | Freq: Three times a day (TID) | ORAL | Status: DC
  Administered 2019-12-08: 4 mg via ORAL
  Filled 2019-12-08 (×3): qty 1

## 2019-12-08 MED ORDER — SODIUM CHLORIDE 0.9 % IV SOLN: 250.0000 mL | INTRAVENOUS | Status: DC | PRN

## 2019-12-08 MED ORDER — EPINEPHRINE 0.3 MG/0.3ML IJ SOAJ: 0.3000 mg | INTRAMUSCULAR | 0 refills | Status: AC | PRN

## 2019-12-08 MED ORDER — KETOROLAC TROMETHAMINE 30 MG/ML IJ SOLN
15.0000 mg | Freq: Once | INTRAMUSCULAR | Status: AC
  Administered 2019-12-08: 15 mg via INTRAVENOUS
  Filled 2019-12-08: qty 1

## 2019-12-08 MED ORDER — CLONAZEPAM 0.5 MG PO TABS
0.5000 mg | ORAL_TABLET | Freq: Once | ORAL | Status: AC
  Administered 2019-12-08: 0.5 mg via ORAL
  Filled 2019-12-08: qty 1

## 2019-12-08 MED ORDER — GABAPENTIN 300 MG PO CAPS
300.0000 mg | ORAL_CAPSULE | Freq: Once | ORAL | Status: AC
  Administered 2019-12-08: 01:00:00 300 mg via ORAL
  Filled 2019-12-08: qty 1

## 2019-12-08 MED ORDER — VENLAFAXINE HCL ER 75 MG PO CP24
75.0000 mg | ORAL_CAPSULE | Freq: Every day | ORAL | Status: DC
  Administered 2019-12-08: 75 mg via ORAL
  Filled 2019-12-08: qty 1

## 2019-12-08 MED ORDER — PANTOPRAZOLE SODIUM 40 MG PO TBEC
40.0000 mg | DELAYED_RELEASE_TABLET | Freq: Every day | ORAL | Status: DC
  Administered 2019-12-08: 40 mg via ORAL
  Filled 2019-12-08: qty 1

## 2019-12-08 MED ORDER — HYDROCODONE-ACETAMINOPHEN 10-325 MG PO TABS
1.0000 | ORAL_TABLET | Freq: Every day | ORAL | Status: DC
  Administered 2019-12-08 (×2): 1 via ORAL
  Filled 2019-12-08 (×2): qty 1

## 2019-12-08 MED ORDER — BUPROPION HCL ER (SR) 150 MG PO TB12
150.0000 mg | ORAL_TABLET | Freq: Two times a day (BID) | ORAL | Status: DC
  Administered 2019-12-08: 150 mg via ORAL
  Filled 2019-12-08 (×2): qty 1

## 2019-12-08 MED ORDER — MELATONIN 5 MG PO TABS: 10.0000 mg | ORAL_TABLET | Freq: Every day | ORAL | Status: DC

## 2019-12-08 MED ORDER — EPINEPHRINE 1 MG/10ML IJ SOSY: 0.3000 mg | PREFILLED_SYRINGE | Freq: Once | INTRAMUSCULAR | Status: DC | PRN

## 2019-12-08 MED ORDER — ALBUTEROL SULFATE HFA 108 (90 BASE) MCG/ACT IN AERS
1.0000 | INHALATION_SPRAY | Freq: Four times a day (QID) | RESPIRATORY_TRACT | Status: DC | PRN
  Filled 2019-12-08: qty 6.7

## 2019-12-08 MED ORDER — CLONAZEPAM 0.5 MG PO TABS
0.5000 mg | ORAL_TABLET | Freq: Three times a day (TID) | ORAL | Status: DC
  Administered 2019-12-08: 0.5 mg via ORAL
  Filled 2019-12-08: qty 1

## 2019-12-08 MED ORDER — SODIUM CHLORIDE 0.9% FLUSH
3.0000 mL | Freq: Two times a day (BID) | INTRAVENOUS | Status: DC
  Administered 2019-12-08: 3 mL via INTRAVENOUS

## 2019-12-08 MED ORDER — FAMOTIDINE IN NACL 20-0.9 MG/50ML-% IV SOLN
20.0000 mg | Freq: Once | INTRAVENOUS | Status: AC
  Administered 2019-12-08: 20 mg via INTRAVENOUS
  Filled 2019-12-08: qty 50

## 2019-12-08 MED ORDER — HYDROCODONE-ACETAMINOPHEN 10-325 MG PO TABS
1.0000 | ORAL_TABLET | Freq: Once | ORAL | Status: AC
  Administered 2019-12-08: 1 via ORAL
  Filled 2019-12-08: qty 1

## 2019-12-08 MED ORDER — MORPHINE SULFATE (PF) 2 MG/ML IV SOLN: 2.0000 mg | Freq: Once | INTRAVENOUS | Status: DC

## 2019-12-08 NOTE — Discharge Instructions (Signed)
Anaphylactic Reaction, Adult An anaphylactic reaction (anaphylaxis) is a sudden, severe allergic reaction by the body's disease-fighting system (immune system). Anaphylaxis can be life-threatening. This condition must be treated right away. Sometimes a person may need to be treated in the hospital. What are the causes? This condition is caused by exposure to a substance that you are allergic to (allergen). In response to this exposure, the body releases proteins (antibodies) and other compounds, such as histamine, into the bloodstream. This causes swelling in certain tissues and loss of blood pressure to important areas, such as the heart and lungs. Common allergens that can cause anaphylaxis include:  Foods, especially peanuts, wheat, shellfish, milk, and eggs.  Medicines.  Insect bites or stings.  Blood or parts of blood received for treatment (transfusions).  Chemicals, such as dyes, latex, and contrast material that is used for medical tests. What increases the risk? This condition is more likely to occur in people who:  Have allergies.  Have had anaphylaxis before.  Have a family history of anaphylaxis.  Have certain medical conditions, including asthma and eczema. What are the signs or symptoms? Symptoms of anaphylaxis may include:  Feeling warm in the face (flushed). This may include redness.  Itchy, red, swollen areas of skin (hives).  Swelling of the eyes, lips, face, mouth, tongue, or throat.  Difficulty breathing, speaking, or swallowing.  Noisy breathing (wheezing).  Dizziness or light-headedness.  Fainting.  Pain or cramping in the abdomen.  Vomiting.  Diarrhea. How is this diagnosed? This condition is diagnosed based on:  Your symptoms.  A physical exam.  Blood tests.  Recent history of exposure to allergens. How is this treated? If you think you are having an anaphylactic reaction, you should do the following right away:  Give yourself an  epinephrine injection using what is commonly called an auto-injector "pen" (pre-filled automatic epinephrine injection device). Your health care provider will teach you how to use an auto-injector pen.  Call for emergency help. If you use a pen, you must still get emergency medical treatment in the hospital. Treatment in the hospital may include: ? Medicines to help:  Tighten your blood vessels (epinephrine).  Relieve itching and hives (antihistamines).  Reduce swelling (corticosteroids). ? Oxygen therapy to help you breathe. ? IV fluids to keep you hydrated. Follow these instructions at home: Safety  Always keep an auto-injector pen near you. This can be lifesaving if you have a severe anaphylactic reaction. Use your auto-injector pen as told by your health care provider.  Do not drive after an anaphylactic reaction until your health care provider approves.  Make sure that you, the members of your household, and your employer know: ? What you are allergic to, so it can be avoided. ? How to use an auto-injector pen to give you an epinephrine injection.  Replace your epinephrine immediately after you use your auto-injector pen. This is important if you have another reaction.  If told by your health care provider, wear a medical alert bracelet or necklace that states your allergy.  Learn the signs of anaphylaxis so that you can recognize and treat it right away.  Work with your health care providers to make an anaphylaxis plan. Preparation is important. General instructions  If you have hives or rash: ? Use an over-the-counter antihistamine as told by your health care provider. ? Apply cold, wet cloths (cold compresses) to your skin or take baths or showers in cool water. Avoid hot water.  Take over-the-counter and prescription medicines only  as told by your health care provider.  Tell all your health care providers that you have an allergy.  Keep all follow-up visits as told by  your health care provider. This is important. How is this prevented?  Avoid allergens that have caused an anaphylactic reaction in the past.  When you are at a restaurant, tell your server that you have an allergy. If you are not sure whether a menu item contains an ingredient that you are allergic to, ask your server. Where to find more information  American Academy of Allergy, Asthma and Immunology: aaaai.org  American Academy of Pediatrics: healthychildren.org Get help right away if:  You develop symptoms of an allergic reaction. You may notice them soon after you are exposed to a substance. Symptoms may include: ? Flushed skin. ? Hives. ? Swelling of the eyes, lips, face, mouth, tongue, or throat. ? Difficulty breathing, speaking, or swallowing. ? Wheezing. ? Dizziness or light-headedness. ? Fainting. ? Pain or cramping in the abdomen. ? Vomiting. ? Diarrhea.  You used epinephrine. You need more medical care even if the medicine seems to be working. This is important because anaphylaxis may happen again within 72 hours (rebound anaphylaxis). You may need more doses of epinephrine. These symptoms may represent a serious problem that is an emergency. Do not wait to see if the symptoms will go away. Do the following right away:  Use the auto-injector pen as you have been instructed.  Get medical help. Call your local emergency services (911 in the [place] Do not drive yourself to the hospital. Summary  An anaphylactic reaction (anaphylaxis) is a sudden, severe allergic reaction by the body's disease-fighting system (immune system).  This condition can be life-threatening. If you have an anaphylactic reaction, get medical help right away.  Your health care provider may teach you how to use an auto-injector "pen" (pre-filled automatic epinephrine injection device) to give yourself a shot.  Always keep an auto-injector pen with you. This could save your life. Use it as told by  your health care provider.  If you use epinephrine, you must still get emergency medical treatment, even if the medicine seems to be working. This information is not intended to replace advice given to you by your health care provider. Make sure you discuss any questions you have with your health care provider. Document Revised: 01/12/2018 Document Reviewed: 01/12/2018 Elsevier Patient Education  [place].

## 2019-12-08 NOTE — Progress Notes (Signed)
Discharge instructions reviewed  With the patient. Dr Mal Misty changed rx to go to [facility] in [place] per the patients request. Patient sent out to her car via wheelchair.

## 2019-12-08 NOTE — ED Notes (Signed)
Sent admitting MD message regarding pt's continued headache. Awaiting response.

## 2019-12-08 NOTE — Discharge Summary (Signed)
Physician Discharge Summary  Olivia Buchanan [id number]:[id number] DOB: Feb 25, 1951 DOA: 12/07/2019  PCP: Olivia Crouch, MD  Admit date: 12/07/2019 Discharge date: 12/08/2019  Discharge disposition: Home   Recommendations for Outpatient Follow-Up:   Outpatient follow-up with allergist as soon as possible    Discharge Diagnosis:   Principal Problem:   Angioedema Active Problems:   HTN (hypertension)   T2DM (type 2 diabetes mellitus) ([place])   COPD (chronic obstructive pulmonary disease) ([place])    Discharge Condition: Stable.  Diet recommendation: Heart healthy and low sugar diet  Code status: Full code.    Hospital Course:   Olivia Buchanan is a 69 year old woman with medical history significant for diabetes, hypertension, COPD, chronic neck pain, who presented to the hospital after she developed facial, tongue and lip swelling after eating at a [employer].  She said when EMS arrived, she was given epinephrine x2, IV Solu-Medrol and IV Benadryl and IV Pepcid.  However, symptoms did not resolve so she was brought to the hospital for further evaluation.  She said she developed similar symptoms in January or February 2020 after she presented the same [place] but at a different location.  She was admitted to the hospital for observation.  She received another dose of epinephrine and a dose of prednisone in the hospital.  All her symptoms have resolved.  She has been advised not to visit that [place] again.  She has been educated on the use of EpiPen when she developed similar symptoms in future.  She has also been advised to see an allergist as soon as possible for further evaluation.      Discharge Exam:   Vitals:   12/08/19 0632 12/08/19 0633  BP: (!) 117/55   Pulse: 85 84  Resp: 20   Temp: 98.4 F (36.9 C)   SpO2: 92% 94%   Vitals:   12/08/19 0330 12/08/19 0558 12/08/19 0632 12/08/19 0633  BP: 123/60  (!) 117/55   Pulse: 93  85 84  Resp: 20   20   Temp:   98.4 F (36.9 C)   TempSrc:   Oral   SpO2: 94%  92% 94%  Weight:  85.1 kg    Height:  5' (1.524 m)       GEN: NAD SKIN: No rash EYES: EOMI ENT: MMM, no facial, tongue or lip swelling CV: RRR PULM: CTA B ABD: soft, ND, NT, +BS CNS: AAO x 3, non focal EXT: No edema or tenderness   The results of significant diagnostics from this hospitalization (including imaging, microbiology, ancillary and laboratory) are listed below for reference.     Procedures and Diagnostic Studies:   CT Head Wo Contrast  Result Date: 12/07/2019 CLINICAL DATA:  Headache. Headache, tension-type severe headache after epinephrine administration Allergic reaction with neck swelling and difficulty breathing. EXAM: CT HEAD WITHOUT CONTRAST TECHNIQUE: Contiguous axial images were obtained from the base of the skull through the vertex without intravenous contrast. COMPARISON:  None. FINDINGS: Brain: No intracranial hemorrhage, mass effect, or midline shift. Brain volume is normal for age. No hydrocephalus. The basilar cisterns are patent. Mild periventricular and deep white matter hypodensity most consistent with chronic small vessel ischemia. Remote lacunar infarct versus prominent perivascular space in the left basal ganglia. No evidence of territorial infarct or acute ischemia. No extra-axial or intracranial fluid collection. Vascular: Atherosclerosis of skullbase vasculature without hyperdense vessel or abnormal calcification. Skull: No fracture or focal lesion. Sinuses/Orbits: Paranasal sinuses and mastoid air cells are  clear. The visualized orbits are unremarkable. Other: None. IMPRESSION: 1. No acute intracranial abnormality. 2. Mild chronic small vessel ischemia. Electronically Signed   By: Olivia Buchanan M.D.   On: 12/07/2019 22:27     Labs:   Basic Metabolic Panel: No results for input(s): NA, K, CL, CO2, GLUCOSE, BUN, CREATININE, CALCIUM, MG, PHOS in the last 168 hours. GFR CrCl cannot be  calculated (Patient's most recent lab result is older than the maximum 21 days allowed.). Liver Function Tests: No results for input(s): AST, ALT, ALKPHOS, BILITOT, PROT, ALBUMIN in the last 168 hours. No results for input(s): LIPASE, AMYLASE in the last 168 hours. No results for input(s): AMMONIA in the last 168 hours. Coagulation profile No results for input(s): INR, PROTIME in the last 168 hours.  CBC: No results for input(s): WBC, NEUTROABS, HGB, HCT, MCV, PLT in the last 168 hours. Cardiac Enzymes: No results for input(s): CKTOTAL, CKMB, CKMBINDEX, TROPONINI in the last 168 hours. BNP: Invalid input(s): POCBNP CBG: Recent Labs  Lab 12/08/19 0113  GLUCAP 235*   D-Dimer No results for input(s): DDIMER in the last 72 hours. Hgb A1c No results for input(s): HGBA1C in the last 72 hours. Lipid Profile No results for input(s): CHOL, HDL, LDLCALC, TRIG, CHOLHDL, LDLDIRECT in the last 72 hours. Thyroid function studies No results for input(s): TSH, T4TOTAL, T3FREE, THYROIDAB in the last 72 hours.  Invalid input(s): FREET3 Anemia work up No results for input(s): VITAMINB12, FOLATE, FERRITIN, TIBC, IRON, RETICCTPCT in the last 72 hours. Microbiology Recent Results (from the past 240 hour(s))  SARS CORONAVIRUS 2 (TAT 6-24 HRS) Nasopharyngeal Nasopharyngeal Swab     Status: None   Collection Time: 12/08/19  1:07 AM   Specimen: Nasopharyngeal Swab  Result Value Ref Range Status   SARS Coronavirus 2 NEGATIVE NEGATIVE Final    Comment: (NOTE) SARS-CoV-2 target nucleic acids are NOT DETECTED. The SARS-CoV-2 RNA is generally detectable in upper and lower respiratory specimens during the acute phase of infection. Negative results do not preclude SARS-CoV-2 infection, do not rule out co-infections with other pathogens, and should not be used as the sole basis for treatment or other patient management decisions. Negative results must be combined with clinical observations, patient  history, and epidemiological information. The expected result is Negative. Fact Sheet for Patients: SugarRoll.be Fact Sheet for Healthcare Providers: https://www.woods-mathews.com/ This test is not yet approved or cleared by the [place] FDA and  has been authorized for detection and/or diagnosis of SARS-CoV-2 by FDA under an Emergency Use Authorization (EUA). This EUA will remain  in effect (meaning this test can be used) for the duration of the COVID-19 declaration under Section 56 4(b)(1) of the Act, 21 U.S.C. section 360bbb-3(b)(1), unless the authorization is terminated or revoked sooner. Performed at [facility], [place] [address] [place], [place] [address]      Discharge Instructions:   Discharge Instructions    Diet - low sodium heart healthy   Complete by: As directed    Increase activity slowly   Complete by: As directed    Schedule appointment   Complete by: As directed    Follow up with an allergist as soon as possible     Allergies as of 12/08/2019      Reactions   Penicillins Hives   Did it involve swelling of the face/tongue/throat, SOB, or low BP? No Did it involve sudden or severe rash/hives, skin peeling, or any reaction on the inside of your mouth or nose? No  Did you need to seek medical attention at a hospital or doctor's office? No When did it last happen?40 years ago If all above answers are "NO", may proceed with cephalosporin use.   Macrobid [nitrofurantoin] Rash      Medication List    TAKE these medications   albuterol 108 (90 Base) MCG/ACT inhaler Commonly known as: VENTOLIN HFA Inhale 1 puff into the lungs every 6 (six) hours as needed for wheezing.   aspirin EC 81 MG tablet Take 81 mg by mouth at bedtime.   buPROPion 150 MG 12 hr tablet Commonly known as: WELLBUTRIN SR Take 150 mg by mouth 2 (two) times daily.   clonazePAM 0.5 MG tablet Commonly known as: KLONOPIN Take 0.5 mg by  mouth 3 (three) times daily.   EPINEPHrine 0.3 mg/0.3 mL Soaj injection Commonly known as: EPI-PEN Inject 0.3 mLs (0.3 mg total) into the muscle as needed for anaphylaxis.   gabapentin 300 MG capsule Commonly known as: NEURONTIN Take 300 mg by mouth 3 (three) times daily.   hydrochlorothiazide 25 MG tablet Commonly known as: HYDRODIURIL Take 25 mg by mouth daily.   HYDROcodone-acetaminophen 10-325 MG tablet Commonly known as: NORCO Take 1 tablet by mouth 5 (five) times daily.   Melatonin 10 MG Tabs Take 10 mg by mouth at bedtime.   Multi-Vitamin tablet Take 1 tablet by mouth daily.   pantoprazole 40 MG tablet Commonly known as: PROTONIX Take 40 mg by mouth at bedtime.   potassium chloride SA 20 MEQ tablet Commonly known as: KLOR-CON Take 20 mEq by mouth 2 (two) times daily.   pravastatin 80 MG tablet Commonly known as: PRAVACHOL Take 80 mg by mouth at bedtime.   tiZANidine 4 MG tablet Commonly known as: ZANAFLEX Take 4 mg by mouth 3 (three) times daily.   venlafaxine XR 75 MG 24 hr capsule Commonly known as: EFFEXOR-XR Take 75 mg by mouth daily.      Follow-up Information    [facility]. Schedule an appointment as soon as possible for a visit in 1 week(s).   Contact information: [address] [place] [address]           Time coordinating discharge: 24 MINUTES  Signed:  Jennye Boroughs  [facility] 12/08/2019, 1:09 PM

## 2019-12-08 NOTE — H&P (Addendum)
History and Physical    Olivia Buchanan [id number]:[id number] DOB: 1951/04/02 DOA: 12/07/2019  PCP: Idelle Crouch, MD  Patient coming from: a restaurant by way of EMS   Chief Complaint: facial swelling  HPI: Olivia Buchanan Olivia Buchanan is a 69 y.o. female with medical history significant for dm, htn, copd, chronic neck pain, who presents with above.  A few months ago, while eating at the same restaurant, the patient abruptly developed swelling of the tongue, lips, face, and tingling in her throat. She immediately purchased and took benadryl, and the symptoms resolved.  Tonight (her birthday), eating at the same restaurant, she abruptly began to again feel lip and tongue swelling. Progressed over course of few minutes to facial swelling and then throat swelling. Felt faint. EMS was called.   She did take an ibuprofen last night, doesn't usually take nsaids.  Known allergies are only to macrobid and penicillins.   Denies dyspnea, denies GI upset. No hives or rash.  ED Course: Via ems received epinephrine x2, solumedrol. In the ED received 3rd dose of epinephrine.   Review of Systems: As per HPI otherwise 10 point review of systems negative.    Past Medical History:  Diagnosis Date  . Angioedema 12/08/2019  . COPD (chronic obstructive pulmonary disease) ([place]) 12/08/2019  . DDD (degenerative disc disease), lumbar   . Diabetes mellitus without complication ([place])   . HTN (hypertension) 12/08/2019  . Hypertension   . T2DM (type 2 diabetes mellitus) ([place]) 12/08/2019    Past Surgical History:  Procedure Laterality Date  . BACK SURGERY       reports that she quit smoking about 2 years ago. She quit after 50.00 years of use. She has never used smokeless tobacco. She reports that she does not drink alcohol or use drugs.  Allergies  Allergen Reactions  . Penicillins Hives    Did it involve swelling of the face/tongue/throat, SOB, or low BP? No Did it involve sudden or severe rash/hives, skin  peeling, or any reaction on the inside of your mouth or nose? No Did you need to seek medical attention at a hospital or doctor's office? No When did it last happen?40 years ago If all above answers are "NO", may proceed with cephalosporin use.   [place] [Nitrofurantoin] Rash    No family history on file.  Prior to Admission medications   Medication Sig Start Date End Date Taking? Authorizing Provider  albuterol (VENTOLIN HFA) 108 (90 Base) MCG/ACT inhaler Inhale 1 puff into the lungs every 6 (six) hours as needed for wheezing. 09/27/19   [provider]  buPROPion (WELLBUTRIN SR) 150 MG 12 hr tablet Take 150 mg by mouth 2 (two) times daily. 11/14/19   [provider]  clonazePAM (KLONOPIN) 0.5 MG tablet Take 0.5 mg by mouth 3 (three) times daily as needed for anxiety. 11/14/19   [provider]  gabapentin (NEURONTIN) 300 MG capsule Take 300 mg by mouth 3 (three) times daily. 11/14/19   [provider]  hydrochlorothiazide (HYDRODIURIL) 25 MG tablet Take 25 mg by mouth daily. 11/14/19   [provider]  HYDROcodone-acetaminophen (NORCO) 10-325 MG tablet Take 1 tablet by mouth 5 (five) times daily as needed for pain. 11/19/19   [provider]  pantoprazole (PROTONIX) 40 MG tablet Take 40 mg by mouth daily. 11/14/19   [provider]  potassium chloride SA (KLOR-CON) 20 MEQ tablet Take 20 mEq by mouth 2 (two) times daily. 11/14/19   [provider]  pravastatin (PRAVACHOL) 80 MG tablet Take 80 mg by mouth at bedtime.  11/14/19   [provider]  tiZANidine (ZANAFLEX) 4 MG tablet Take 4 mg by mouth 3 (three) times daily. 11/14/19   [provider]  venlafaxine XR (EFFEXOR-XR) 75 MG 24 hr capsule Take 75 mg by mouth daily. 11/14/19   [provider]    Physical Exam: Vitals:   12/07/19 2051 12/07/19 2055 12/07/19 2057 12/07/19 2104  BP:    136/65  Pulse:    83  Resp:    17  Temp:    97.9 F (36.6  C)  TempSrc:    Oral  SpO2: 95% 95%  96%  Weight:   85.3 kg   Height:   5' (1.524 m)     Constitutional: No acute distress Head: Atraumatic, some swelling of lower face/neck Eyes: Conjunctiva clear ENM: Moist mucous membranes. Normal dentition. No tongue or palate swelling Neck: Supple Respiratory: Clear to auscultation bilaterally, no wheezing/rales/rhonchi. Normal respiratory effort. No accessory muscle use. . Cardiovascular: Regular rate and rhythm. No murmurs/rubs/gallops. Abdomen: Non-tender, non-distended. No masses. No rebound or guarding. Positive bowel sounds. Musculoskeletal: No joint deformity upper and lower extremities. Normal ROM, no contractures. Normal muscle tone.  Skin: No rashes, lesions, or ulcers.  Extremities: No peripheral edema. Palpable peripheral pulses. Neurologic: Alert, moving all 4 extremities. Psychiatric: Normal insight and judgement.   Labs on Admission: I have personally reviewed following labs and imaging studies  CBC: No results for input(s): WBC, NEUTROABS, HGB, HCT, MCV, PLT in the last 168 hours. Basic Metabolic Panel: No results for input(s): NA, K, CL, CO2, GLUCOSE, BUN, CREATININE, CALCIUM, MG, PHOS in the last 168 hours. GFR: CrCl cannot be calculated (Patient's most recent lab result is older than the maximum 21 days allowed.). Liver Function Tests: No results for input(s): AST, ALT, ALKPHOS, BILITOT, PROT, ALBUMIN in the last 168 hours. No results for input(s): LIPASE, AMYLASE in the last 168 hours. No results for input(s): AMMONIA in the last 168 hours. Coagulation Profile: No results for input(s): INR, PROTIME in the last 168 hours. Cardiac Enzymes: No results for input(s): CKTOTAL, CKMB, CKMBINDEX, TROPONINI in the last 168 hours. BNP (last 3 results) No results for input(s): PROBNP in the last 8760 hours. HbA1C: No results for input(s): HGBA1C in the last 72 hours. CBG: No results for input(s): GLUCAP in the last 168  hours. Lipid Profile: No results for input(s): CHOL, HDL, LDLCALC, TRIG, CHOLHDL, LDLDIRECT in the last 72 hours. Thyroid Function Tests: No results for input(s): TSH, T4TOTAL, FREET4, T3FREE, THYROIDAB in the last 72 hours. Anemia Panel: No results for input(s): VITAMINB12, FOLATE, FERRITIN, TIBC, IRON, RETICCTPCT in the last 72 hours. Urine analysis:    Component Value Date/Time   COLORURINE YELLOW 09/18/2008 1446   APPEARANCEUR HAZY (A) 09/18/2008 1446   LABSPEC 1.027 09/18/2008 1446   PHURINE 5.5 09/18/2008 1446   GLUCOSEU NEGATIVE 09/18/2008 1446   HGBUR NEGATIVE 09/18/2008 1446   BILIRUBINUR SMALL (A) 09/18/2008 1446   KETONESUR NEGATIVE 09/18/2008 1446   PROTEINUR NEGATIVE 09/18/2008 1446   UROBILINOGEN 1.0 09/18/2008 1446   NITRITE NEGATIVE 09/18/2008 1446   LEUKOCYTESUR MODERATE (A) 09/18/2008 1446    Radiological Exams on Admission: CT Head Wo Contrast  Result Date: 12/07/2019 CLINICAL DATA:  Headache. Headache, tension-type severe headache after epinephrine administration Allergic reaction with neck swelling and difficulty breathing. EXAM: CT HEAD WITHOUT CONTRAST TECHNIQUE: Contiguous axial images were obtained from the base of the skull through  the vertex without intravenous contrast. COMPARISON:  None. FINDINGS: Brain: No intracranial hemorrhage, mass effect, or midline shift. Brain volume is normal for age. No hydrocephalus. The basilar cisterns are patent. Mild periventricular and deep white matter hypodensity most consistent with chronic small vessel ischemia. Remote lacunar infarct versus prominent perivascular space in the left basal ganglia. No evidence of territorial infarct or acute ischemia. No extra-axial or intracranial fluid collection. Vascular: Atherosclerosis of skullbase vasculature without hyperdense vessel or abnormal calcification. Skull: No fracture or focal lesion. Sinuses/Orbits: Paranasal sinuses and mastoid air cells are clear. The visualized orbits are  unremarkable. Other: None. IMPRESSION: 1. No acute intracranial abnormality. 2. Mild chronic small vessel ischemia. Electronically Signed   By: Keith Rake M.D.   On: 12/07/2019 22:27     Assessment/Plan Principal Problem:   Angioedema Active Problems:   HTN (hypertension)   T2DM (type 2 diabetes mellitus) ([place])   COPD (chronic obstructive pulmonary disease) ([place])   # Angioedema - of face and oropharynx. Unclear if true anaphylaxis: no documented hypotension or GI upset, no report of flushing or urticaria or pruritus; is hard to tell from history whether had just swelling of airway or actual respiratory compromise. Onset suggests mast cell mediation, as does fact that happened few months ago with similar food trigger. No acei use; did recently use nsaid and says doesn't normally. Otherwise no new medications. Symptoms much improved after epinephrine x3, solumedrol.  - admit for observation - will have epinephrine on hand - prednisone 40 for tomorrow, zyrtec daily - f/u cbc, cmp, c4, crp - likely outpt allergy f/u  # htn - here bp wnl - given concern for possible anaphylaxis, will hold on home hctz for now, and monitor - Cont home asa, pravastatin  # diet-controlled dm - a1c 6.3 10/2019 - outpt f/u  # Chronic pain - cont home gabapentin, norco, xanaflex  # Anxiety - cont home wellbutrin, clonazapam., effexor  # copd - mild, asymptomatic - home advair prn  DVT prophylaxis: SCDs, early ambulation Code Status: full  Family Communication: daniel (daughter) on speakerphone during my eval Disposition Plan: tbd  Consults called: none  Admission status: obs    Desma Maxim MD [facility] Pager [phone]  If 7PM-7AM, please contact night-coverage [email or web address] Password [email or web address]  12/08/2019, 12:40 AM

## 2020-01-01 ENCOUNTER — Ambulatory Visit: Payer: Medicare HMO | Admitting: Allergy & Immunology

## 2020-03-05 DIAGNOSIS — I10 Essential (primary) hypertension: Secondary | ICD-10-CM | POA: Diagnosis not present

## 2020-03-05 DIAGNOSIS — Z79899 Other long term (current) drug therapy: Secondary | ICD-10-CM | POA: Diagnosis not present

## 2020-03-05 DIAGNOSIS — G8929 Other chronic pain: Secondary | ICD-10-CM | POA: Diagnosis not present

## 2020-03-05 DIAGNOSIS — J431 Panlobular emphysema: Secondary | ICD-10-CM | POA: Diagnosis not present

## 2020-03-05 DIAGNOSIS — J449 Chronic obstructive pulmonary disease, unspecified: Secondary | ICD-10-CM | POA: Diagnosis not present

## 2020-03-05 DIAGNOSIS — F329 Major depressive disorder, single episode, unspecified: Secondary | ICD-10-CM | POA: Diagnosis not present

## 2020-03-05 DIAGNOSIS — M545 Low back pain: Secondary | ICD-10-CM | POA: Diagnosis not present

## 2020-03-05 DIAGNOSIS — Z87891 Personal history of nicotine dependence: Secondary | ICD-10-CM | POA: Diagnosis not present

## 2020-03-05 DIAGNOSIS — E7800 Pure hypercholesterolemia, unspecified: Secondary | ICD-10-CM | POA: Diagnosis not present

## 2020-03-05 DIAGNOSIS — E1165 Type 2 diabetes mellitus with hyperglycemia: Secondary | ICD-10-CM | POA: Diagnosis not present

## 2020-06-18 DIAGNOSIS — E7800 Pure hypercholesterolemia, unspecified: Secondary | ICD-10-CM | POA: Diagnosis not present

## 2020-06-18 DIAGNOSIS — Z79899 Other long term (current) drug therapy: Secondary | ICD-10-CM | POA: Diagnosis not present

## 2020-06-18 DIAGNOSIS — Z1211 Encounter for screening for malignant neoplasm of colon: Secondary | ICD-10-CM | POA: Diagnosis not present

## 2020-06-18 DIAGNOSIS — E1165 Type 2 diabetes mellitus with hyperglycemia: Secondary | ICD-10-CM | POA: Diagnosis not present

## 2020-06-18 DIAGNOSIS — Z1231 Encounter for screening mammogram for malignant neoplasm of breast: Secondary | ICD-10-CM | POA: Diagnosis not present

## 2020-06-18 DIAGNOSIS — I10 Essential (primary) hypertension: Secondary | ICD-10-CM | POA: Diagnosis not present

## 2020-06-19 ENCOUNTER — Other Ambulatory Visit: Payer: Self-pay | Admitting: Internal Medicine

## 2020-06-19 DIAGNOSIS — Z1231 Encounter for screening mammogram for malignant neoplasm of breast: Secondary | ICD-10-CM

## 2020-07-22 DIAGNOSIS — Z1211 Encounter for screening for malignant neoplasm of colon: Secondary | ICD-10-CM | POA: Diagnosis not present

## 2020-08-11 DIAGNOSIS — K921 Melena: Secondary | ICD-10-CM | POA: Diagnosis not present

## 2020-10-09 DIAGNOSIS — I10 Essential (primary) hypertension: Secondary | ICD-10-CM | POA: Diagnosis not present

## 2020-10-09 DIAGNOSIS — E7800 Pure hypercholesterolemia, unspecified: Secondary | ICD-10-CM | POA: Diagnosis not present

## 2020-10-09 DIAGNOSIS — Z1231 Encounter for screening mammogram for malignant neoplasm of breast: Secondary | ICD-10-CM | POA: Diagnosis not present

## 2020-10-09 DIAGNOSIS — G8929 Other chronic pain: Secondary | ICD-10-CM | POA: Diagnosis not present

## 2020-10-09 DIAGNOSIS — Z79899 Other long term (current) drug therapy: Secondary | ICD-10-CM | POA: Diagnosis not present

## 2020-10-09 DIAGNOSIS — J431 Panlobular emphysema: Secondary | ICD-10-CM | POA: Diagnosis not present

## 2020-10-09 DIAGNOSIS — E1165 Type 2 diabetes mellitus with hyperglycemia: Secondary | ICD-10-CM | POA: Diagnosis not present

## 2021-03-20 DIAGNOSIS — I10 Essential (primary) hypertension: Secondary | ICD-10-CM | POA: Diagnosis not present

## 2021-03-20 DIAGNOSIS — E119 Type 2 diabetes mellitus without complications: Secondary | ICD-10-CM | POA: Diagnosis not present

## 2021-03-20 DIAGNOSIS — Z0000 Encounter for general adult medical examination without abnormal findings: Secondary | ICD-10-CM | POA: Diagnosis not present

## 2021-03-20 DIAGNOSIS — J449 Chronic obstructive pulmonary disease, unspecified: Secondary | ICD-10-CM | POA: Diagnosis not present

## 2021-03-20 DIAGNOSIS — Z79899 Other long term (current) drug therapy: Secondary | ICD-10-CM | POA: Diagnosis not present

## 2021-03-20 DIAGNOSIS — E785 Hyperlipidemia, unspecified: Secondary | ICD-10-CM | POA: Diagnosis not present

## 2021-03-20 DIAGNOSIS — R0602 Shortness of breath: Secondary | ICD-10-CM | POA: Diagnosis not present

## 2021-03-24 ENCOUNTER — Other Ambulatory Visit: Payer: Self-pay | Admitting: Internal Medicine

## 2021-03-24 DIAGNOSIS — R0602 Shortness of breath: Secondary | ICD-10-CM

## 2021-03-24 DIAGNOSIS — R9389 Abnormal findings on diagnostic imaging of other specified body structures: Secondary | ICD-10-CM

## 2021-03-24 DIAGNOSIS — R911 Solitary pulmonary nodule: Secondary | ICD-10-CM

## 2021-04-02 ENCOUNTER — Ambulatory Visit
Admission: RE | Admit: 2021-04-02 | Discharge: 2021-04-02 | Disposition: A | Discharge status: Home / Self Care | Payer: Medicare HMO | Source: Ambulatory Visit | Attending: Internal Medicine | Admitting: Internal Medicine

## 2021-04-02 ENCOUNTER — Other Ambulatory Visit (HOSPITAL_COMMUNITY): Payer: Self-pay | Admitting: Internal Medicine

## 2021-04-02 ENCOUNTER — Other Ambulatory Visit: Payer: Self-pay

## 2021-04-02 ENCOUNTER — Other Ambulatory Visit: Payer: Self-pay | Admitting: Internal Medicine

## 2021-04-02 DIAGNOSIS — I700 Atherosclerosis of aorta: Secondary | ICD-10-CM | POA: Diagnosis not present

## 2021-04-02 DIAGNOSIS — R911 Solitary pulmonary nodule: Secondary | ICD-10-CM

## 2021-04-02 DIAGNOSIS — J431 Panlobular emphysema: Secondary | ICD-10-CM | POA: Diagnosis not present

## 2021-04-02 DIAGNOSIS — R9389 Abnormal findings on diagnostic imaging of other specified body structures: Secondary | ICD-10-CM | POA: Diagnosis not present

## 2021-04-02 DIAGNOSIS — I10 Essential (primary) hypertension: Secondary | ICD-10-CM | POA: Diagnosis not present

## 2021-04-02 DIAGNOSIS — R0602 Shortness of breath: Secondary | ICD-10-CM

## 2021-04-02 DIAGNOSIS — R918 Other nonspecific abnormal finding of lung field: Secondary | ICD-10-CM

## 2021-04-02 HISTORY — DX: Other nonspecific abnormal finding of lung field: R91.8

## 2021-04-02 MED ORDER — IOHEXOL 300 MG/ML  SOLN
75.0000 mL | Freq: Once | INTRAMUSCULAR | Status: AC | PRN
  Administered 2021-04-02: 75 mL via INTRAVENOUS

## 2021-04-07 DIAGNOSIS — J984 Other disorders of lung: Secondary | ICD-10-CM | POA: Diagnosis not present

## 2021-04-08 ENCOUNTER — Ambulatory Visit: Payer: Medicare HMO

## 2021-04-09 DIAGNOSIS — Z1231 Encounter for screening mammogram for malignant neoplasm of breast: Secondary | ICD-10-CM | POA: Diagnosis not present

## 2021-04-13 ENCOUNTER — Other Ambulatory Visit: Payer: Self-pay

## 2021-04-13 ENCOUNTER — Encounter
Admission: RE | Admit: 2021-04-13 | Discharge: 2021-04-13 | Disposition: A | Discharge status: Home / Self Care | Payer: Medicare HMO | Source: Ambulatory Visit | Attending: Pulmonary Disease | Admitting: Pulmonary Disease

## 2021-04-13 HISTORY — DX: Depression, unspecified: F32.A

## 2021-04-13 HISTORY — DX: Hyperlipidemia, unspecified: E78.5

## 2021-04-13 HISTORY — DX: Anxiety disorder, unspecified: F41.9

## 2021-04-13 HISTORY — DX: Obesity, unspecified: E66.9

## 2021-04-13 HISTORY — DX: Gastro-esophageal reflux disease without esophagitis: K21.9

## 2021-04-13 NOTE — Patient Instructions (Addendum)
Your procedure is scheduled on: Wednesday, July 13 Report to the Registration Desk on the 1st floor of the [facility]. To find out your arrival time, please call [phone] between 1PM - 3PM on: Tuesday, July 12  REMEMBER: Instructions that are not followed completely may result in serious medical risk, up to and including death; or upon the discretion of your surgeon and anesthesiologist your surgery may need to be rescheduled.  Do not eat food after midnight the night before surgery.  No gum chewing, lozengers or hard candies.  You may however, drink water up to 2 hours before you are scheduled to arrive for your surgery. Do not drink anything within 2 hours of your scheduled arrival time.  TAKE THESE MEDICATIONS THE MORNING OF SURGERY WITH A SIP OF WATER:  Albuterol inhaler Gabapentin Hydrocodone if needed for pain Duoneb nebulizer Pantoprazole - (take one the night before and one on the morning of surgery - helps to prevent nausea after surgery.) Advair inhaler  Use inhalers on the day of surgery and bring to the hospital.  Follow recommendations from Cardiologist, Pulmonologist or PCP regarding stopping Aspirin. Stop aspirin 5 days prior to surgery per Dr. Lanney Gins instructions. Resume AFTER surgery per surgeon's instruction.  One week prior to surgery:  Stop Anti-inflammatories (NSAIDS) such as Advil, Aleve, Ibuprofen, Motrin, Naproxen, Naprosyn and Aspirin based products such as Excedrin, Goodys Powder, BC Powder. Stop ANY OVER THE COUNTER supplements until after surgery. You may however, continue to take Tylenol if needed for pain up until the day of surgery.  No Alcohol for 24 hours before or after surgery.  No Smoking including e-cigarettes for 24 hours prior to surgery.  No chewable tobacco products for at least 6 hours prior to surgery.  No nicotine patches on the day of surgery.  Do not use any "recreational" drugs for at least a week prior to your surgery.   Please be advised that the combination of cocaine and anesthesia may have negative outcomes, up to and including death. If you test positive for cocaine, your surgery will be cancelled.  On the morning of surgery brush your teeth with toothpaste and water, you may rinse your mouth with mouthwash if you wish. Do not swallow any toothpaste or mouthwash.  Do not wear jewelry, make-up, hairpins, clips or nail polish.  Do not wear lotions, powders, or perfumes.   Contact lenses, hearing aids and dentures may not be worn into surgery.  Do not bring valuables to the hospital. [facility] is not responsible for any missing/lost belongings or valuables.   Notify your doctor if there is any change in your medical condition (cold, fever, infection).  Wear comfortable clothing (specific to your surgery type) to the hospital.  If you are being discharged the day of surgery, you will not be allowed to drive home. You will need a responsible adult (18 years or older) to drive you home and stay with you that night.   If you are taking public transportation, you will need to have a responsible adult (18 years or older) with you. Please confirm with your physician that it is acceptable to use public transportation.   Please call the [facility] at [phone] if you have any questions about these instructions.  Surgery Visitation Policy:  Patients undergoing a surgery or procedure may have one family member or support person with them as long as that person is not COVID-19 positive or experiencing its symptoms.  That person may remain  in the waiting area during the procedure.

## 2021-04-14 ENCOUNTER — Other Ambulatory Visit: Admission: RE | Admit: 2021-04-14 | Payer: Medicare HMO | Source: Ambulatory Visit

## 2021-04-14 ENCOUNTER — Other Ambulatory Visit: Payer: Medicare HMO

## 2021-04-14 ENCOUNTER — Encounter
Admission: RE | Admit: 2021-04-14 | Discharge: 2021-04-14 | Disposition: A | Discharge status: Home / Self Care | Payer: Medicare HMO | Source: Ambulatory Visit | Attending: Pulmonary Disease | Admitting: Pulmonary Disease

## 2021-04-14 ENCOUNTER — Encounter: Payer: Self-pay | Admitting: Urgent Care

## 2021-04-14 DIAGNOSIS — E119 Type 2 diabetes mellitus without complications: Secondary | ICD-10-CM | POA: Diagnosis not present

## 2021-04-14 DIAGNOSIS — Z01812 Encounter for preprocedural laboratory examination: Secondary | ICD-10-CM | POA: Diagnosis not present

## 2021-04-14 DIAGNOSIS — I10 Essential (primary) hypertension: Secondary | ICD-10-CM | POA: Diagnosis not present

## 2021-04-14 DIAGNOSIS — Z20822 Contact with and (suspected) exposure to covid-19: Secondary | ICD-10-CM | POA: Insufficient documentation

## 2021-04-14 LAB — SARS CORONAVIRUS 2 (TAT 6-24 HRS): SARS Coronavirus 2: NEGATIVE

## 2021-04-14 LAB — BASIC METABOLIC PANEL
Anion gap: 12 (ref 5–15)
BUN: 8 mg/dL (ref 8–23)
CO2: 26 mmol/L (ref 22–32)
Calcium: 8.7 mg/dL — ABNORMAL LOW (ref 8.9–10.3)
Chloride: 99 mmol/L (ref 98–111)
Creatinine, Ser: 0.57 mg/dL (ref 0.44–1.00)
GFR, Estimated: >60 mL/min (ref >60)
Glucose, Bld: 159 mg/dL — ABNORMAL HIGH (ref 70–99)
Potassium: 3.4 mmol/L — ABNORMAL LOW (ref 3.5–5.1)
Sodium: 137 mmol/L (ref 135–145)

## 2021-04-15 ENCOUNTER — Other Ambulatory Visit: Payer: Self-pay

## 2021-04-15 ENCOUNTER — Ambulatory Visit: Payer: Medicare HMO | Admitting: Urgent Care

## 2021-04-15 ENCOUNTER — Ambulatory Visit
Admission: RE | Admit: 2021-04-15 | Discharge: 2021-04-15 | Disposition: A | Discharge status: Home / Self Care | Payer: Medicare HMO | Source: Ambulatory Visit | Attending: Pulmonary Disease | Admitting: Pulmonary Disease

## 2021-04-15 ENCOUNTER — Ambulatory Visit
Admission: RE | Admit: 2021-04-15 | Discharge: 2021-04-15 | Disposition: A | Discharge status: Home / Self Care | Payer: Medicare HMO | Attending: Pulmonary Disease | Admitting: Pulmonary Disease

## 2021-04-15 ENCOUNTER — Ambulatory Visit: Payer: Medicare HMO

## 2021-04-15 ENCOUNTER — Encounter
Admission: RE | Disposition: A | Discharge status: Home / Self Care | Payer: Self-pay | Source: Home / Self Care | Attending: Pulmonary Disease

## 2021-04-15 ENCOUNTER — Other Ambulatory Visit: Payer: Self-pay | Admitting: Pulmonary Disease

## 2021-04-15 ENCOUNTER — Encounter: Payer: Self-pay | Admitting: *Deleted

## 2021-04-15 DIAGNOSIS — J449 Chronic obstructive pulmonary disease, unspecified: Secondary | ICD-10-CM | POA: Insufficient documentation

## 2021-04-15 DIAGNOSIS — R918 Other nonspecific abnormal finding of lung field: Secondary | ICD-10-CM | POA: Diagnosis not present

## 2021-04-15 DIAGNOSIS — R911 Solitary pulmonary nodule: Secondary | ICD-10-CM

## 2021-04-15 DIAGNOSIS — Z881 Allergy status to other antibiotic agents status: Secondary | ICD-10-CM | POA: Diagnosis not present

## 2021-04-15 DIAGNOSIS — Z886 Allergy status to analgesic agent status: Secondary | ICD-10-CM | POA: Insufficient documentation

## 2021-04-15 DIAGNOSIS — E785 Hyperlipidemia, unspecified: Secondary | ICD-10-CM | POA: Diagnosis not present

## 2021-04-15 DIAGNOSIS — I10 Essential (primary) hypertension: Secondary | ICD-10-CM | POA: Diagnosis not present

## 2021-04-15 DIAGNOSIS — R590 Localized enlarged lymph nodes: Secondary | ICD-10-CM | POA: Diagnosis not present

## 2021-04-15 DIAGNOSIS — Z79899 Other long term (current) drug therapy: Secondary | ICD-10-CM | POA: Insufficient documentation

## 2021-04-15 DIAGNOSIS — C3411 Malignant neoplasm of upper lobe, right bronchus or lung: Secondary | ICD-10-CM | POA: Insufficient documentation

## 2021-04-15 DIAGNOSIS — E669 Obesity, unspecified: Secondary | ICD-10-CM | POA: Insufficient documentation

## 2021-04-15 DIAGNOSIS — Z7982 Long term (current) use of aspirin: Secondary | ICD-10-CM | POA: Diagnosis not present

## 2021-04-15 DIAGNOSIS — Z6839 Body mass index (BMI) 39.0-39.9, adult: Secondary | ICD-10-CM | POA: Diagnosis not present

## 2021-04-15 DIAGNOSIS — K219 Gastro-esophageal reflux disease without esophagitis: Secondary | ICD-10-CM | POA: Diagnosis not present

## 2021-04-15 DIAGNOSIS — D381 Neoplasm of uncertain behavior of trachea, bronchus and lung: Secondary | ICD-10-CM

## 2021-04-15 DIAGNOSIS — I700 Atherosclerosis of aorta: Secondary | ICD-10-CM | POA: Diagnosis not present

## 2021-04-15 DIAGNOSIS — J9811 Atelectasis: Secondary | ICD-10-CM | POA: Diagnosis not present

## 2021-04-15 DIAGNOSIS — Z87891 Personal history of nicotine dependence: Secondary | ICD-10-CM | POA: Diagnosis not present

## 2021-04-15 DIAGNOSIS — C3490 Malignant neoplasm of unspecified part of unspecified bronchus or lung: Secondary | ICD-10-CM | POA: Diagnosis not present

## 2021-04-15 HISTORY — PX: VIDEO BRONCHOSCOPY WITH ENDOBRONCHIAL NAVIGATION: SHX6175

## 2021-04-15 HISTORY — DX: Atherosclerosis of aorta: I70.0

## 2021-04-15 HISTORY — DX: Atherosclerotic heart disease of native coronary artery without angina pectoris: I25.10

## 2021-04-15 HISTORY — PX: VIDEO BRONCHOSCOPY WITH ENDOBRONCHIAL ULTRASOUND: SHX6177

## 2021-04-15 LAB — GLUCOSE, CAPILLARY
Glucose-Capillary: 142 mg/dL — ABNORMAL HIGH (ref 70–99)
Glucose-Capillary: 158 mg/dL — ABNORMAL HIGH (ref 70–99)

## 2021-04-15 SURGERY — BRONCHOSCOPY, WITH EBUS
Anesthesia: General

## 2021-04-15 MED ORDER — MIDAZOLAM HCL 2 MG/2ML IJ SOLN
INTRAMUSCULAR | Status: DC | PRN
  Administered 2021-04-15: 2 mg via INTRAVENOUS

## 2021-04-15 MED ORDER — IPRATROPIUM-ALBUTEROL 0.5-2.5 (3) MG/3ML IN SOLN: 3.0000 mL | Freq: Once | RESPIRATORY_TRACT | Status: AC

## 2021-04-15 MED ORDER — ROCURONIUM BROMIDE 10 MG/ML (PF) SYRINGE
PREFILLED_SYRINGE | INTRAVENOUS | Status: AC
  Filled 2021-04-15: qty 10

## 2021-04-15 MED ORDER — MEPERIDINE HCL 25 MG/ML IJ SOLN: 6.2500 mg | INTRAMUSCULAR | Status: DC | PRN

## 2021-04-15 MED ORDER — LIDOCAINE HCL URETHRAL/MUCOSAL 2 % EX GEL
1.0000 "application " | Freq: Once | CUTANEOUS | Status: DC
  Filled 2021-04-15: qty 5

## 2021-04-15 MED ORDER — PROPOFOL 10 MG/ML IV BOLUS
INTRAVENOUS | Status: DC | PRN
  Administered 2021-04-15: 150 mg via INTRAVENOUS

## 2021-04-15 MED ORDER — FENTANYL CITRATE (PF) 100 MCG/2ML IJ SOLN: 25.0000 ug | INTRAMUSCULAR | Status: DC | PRN

## 2021-04-15 MED ORDER — LIDOCAINE HCL (PF) 2 % IJ SOLN
INTRAMUSCULAR | Status: AC
  Filled 2021-04-15: qty 5

## 2021-04-15 MED ORDER — SUCCINYLCHOLINE CHLORIDE 20 MG/ML IJ SOLN
INTRAMUSCULAR | Status: DC | PRN
  Administered 2021-04-15: 100 mg via INTRAVENOUS

## 2021-04-15 MED ORDER — ONDANSETRON HCL 4 MG/2ML IJ SOLN
INTRAMUSCULAR | Status: AC
  Filled 2021-04-15: qty 2

## 2021-04-15 MED ORDER — SUCCINYLCHOLINE CHLORIDE 200 MG/10ML IV SOSY
PREFILLED_SYRINGE | INTRAVENOUS | Status: AC
  Filled 2021-04-15: qty 10

## 2021-04-15 MED ORDER — PHENYLEPHRINE HCL (PRESSORS) 10 MG/ML IV SOLN
INTRAVENOUS | Status: DC | PRN
  Administered 2021-04-15 (×3): 100 ug via INTRAVENOUS

## 2021-04-15 MED ORDER — ROCURONIUM BROMIDE 100 MG/10ML IV SOLN
INTRAVENOUS | Status: DC | PRN
  Administered 2021-04-15: 10 mg via INTRAVENOUS
  Administered 2021-04-15: 30 mg via INTRAVENOUS
  Administered 2021-04-15: 20 mg via INTRAVENOUS

## 2021-04-15 MED ORDER — MIDAZOLAM HCL 2 MG/2ML IJ SOLN
INTRAMUSCULAR | Status: AC
  Filled 2021-04-15: qty 2

## 2021-04-15 MED ORDER — ONDANSETRON HCL 4 MG/2ML IJ SOLN: 4.0000 mg | Freq: Once | INTRAMUSCULAR | Status: DC | PRN

## 2021-04-15 MED ORDER — ONDANSETRON HCL 4 MG/2ML IJ SOLN
INTRAMUSCULAR | Status: DC | PRN
  Administered 2021-04-15: 4 mg via INTRAVENOUS

## 2021-04-15 MED ORDER — BUTAMBEN-TETRACAINE-BENZOCAINE 2-2-14 % EX AERO
1.0000 | INHALATION_SPRAY | Freq: Once | CUTANEOUS | Status: DC
  Filled 2021-04-15: qty 20

## 2021-04-15 MED ORDER — PROPOFOL 10 MG/ML IV BOLUS
INTRAVENOUS | Status: AC
  Filled 2021-04-15: qty 20

## 2021-04-15 MED ORDER — ORAL CARE MOUTH RINSE: 15.0000 mL | Freq: Once | OROMUCOSAL | Status: DC

## 2021-04-15 MED ORDER — PHENYLEPHRINE HCL 0.25 % NA SOLN
1.0000 | Freq: Four times a day (QID) | NASAL | Status: DC | PRN
  Filled 2021-04-15: qty 15

## 2021-04-15 MED ORDER — SEVOFLURANE IN SOLN
RESPIRATORY_TRACT | Status: AC
  Filled 2021-04-15: qty 250

## 2021-04-15 MED ORDER — LIDOCAINE HCL (PF) 1 % IJ SOLN
30.0000 mL | Freq: Once | INTRAMUSCULAR | Status: DC
  Filled 2021-04-15: qty 30

## 2021-04-15 MED ORDER — IPRATROPIUM-ALBUTEROL 0.5-2.5 (3) MG/3ML IN SOLN
RESPIRATORY_TRACT | Status: AC
  Administered 2021-04-15: 3 mL via RESPIRATORY_TRACT
  Filled 2021-04-15: qty 3

## 2021-04-15 MED ORDER — DEXAMETHASONE SODIUM PHOSPHATE 10 MG/ML IJ SOLN
INTRAMUSCULAR | Status: AC
  Filled 2021-04-15: qty 1

## 2021-04-15 MED ORDER — DEXAMETHASONE SODIUM PHOSPHATE 10 MG/ML IJ SOLN
INTRAMUSCULAR | Status: DC | PRN
  Administered 2021-04-15: 8 mg via INTRAVENOUS

## 2021-04-15 MED ORDER — CHLORHEXIDINE GLUCONATE 0.12 % MT SOLN: 15.0000 mL | Freq: Once | OROMUCOSAL | Status: DC

## 2021-04-15 MED ORDER — SUGAMMADEX SODIUM 200 MG/2ML IV SOLN
INTRAVENOUS | Status: DC | PRN
  Administered 2021-04-15: 200 mg via INTRAVENOUS

## 2021-04-15 MED ORDER — SODIUM CHLORIDE 0.9 % IV SOLN: INTRAVENOUS | Status: DC

## 2021-04-15 MED ORDER — LIDOCAINE HCL 4 % MT SOLN
OROMUCOSAL | Status: DC | PRN
  Administered 2021-04-15: 4 mL via TOPICAL

## 2021-04-15 MED ORDER — FENTANYL CITRATE (PF) 100 MCG/2ML IJ SOLN
INTRAMUSCULAR | Status: DC | PRN
  Administered 2021-04-15 (×2): 50 ug via INTRAVENOUS

## 2021-04-15 MED ORDER — DEXMEDETOMIDINE (PRECEDEX) IN NS 20 MCG/5ML (4 MCG/ML) IV SYRINGE
PREFILLED_SYRINGE | INTRAVENOUS | Status: AC
  Filled 2021-04-15: qty 5

## 2021-04-15 MED ORDER — CHLORHEXIDINE GLUCONATE 0.12 % MT SOLN
OROMUCOSAL | Status: AC
  Filled 2021-04-15: qty 15

## 2021-04-15 MED ORDER — LIDOCAINE HCL (CARDIAC) PF 100 MG/5ML IV SOSY
PREFILLED_SYRINGE | INTRAVENOUS | Status: DC | PRN
  Administered 2021-04-15: 100 mg via INTRAVENOUS

## 2021-04-15 MED ORDER — BISOPROLOL FUMARATE 5 MG PO TABS
5.0000 mg | ORAL_TABLET | Freq: Once | ORAL | Status: DC
  Filled 2021-04-15: qty 1

## 2021-04-15 MED ORDER — FENTANYL CITRATE (PF) 100 MCG/2ML IJ SOLN
INTRAMUSCULAR | Status: AC
  Filled 2021-04-15: qty 2

## 2021-04-15 NOTE — Procedures (Signed)
ELECTROMAGNETIC NAVIGATIONAL BRONCHOSCOPY PROCEDURE NOTE  FIBEROPTIC BRONCHOSCOPY WITH ASPIRATION OF TRACHEOBRONCHIAL TREE AND BRONCHOALVEOLAR LAVAGE PROCEDURE NOTE  ENDOBRONCHIAL ULTRASOUND PROCEDURE NOTE    Flexible bronchoscopy was performed  by : Lanney Gins MD  assistance by : 1)Repiratory therapist  and 2)[employer] staff and 3) Anesthesia team and 4) Flouroscopy team and 5) [employer] supporting staff   Indication for the procedure was :  Pre-procedural H&P. The following assessment was performed on the day of the procedure prior to initiating sedation History:  Chest pain n Dyspnea y Hemoptysis n Cough y Fever n Other pertinent items n  Examination Vital signs -reviewed as per nursing documentation today Cardiac    Murmurs: n  Rubs : n  Gallop: n Lungs Wheezing: n Rales : n Rhonchi :y  Other pertinent findings: SOB/hypoxemia due to chronic lung disease   Pre-procedural assessment for Procedural Sedation included: Depth of sedation: As per anesthesia team  ASA Classification:  2 Mallampati airway assessment: 3    Medication list reviewed: y  The patient's interval history was taken and revealed: no new complaints The pre- procedure physical examination revealed: No new findings Refer to prior clinic note for details.  Informed Consent: Informed consent was obtained from:  patient after explanation of procedure and risks, benefits, as well as alternative procedures available.  Explanation of level of sedation and possible transfusion was also provided.    Procedural Preparation: Time out was performed and patient was identified by name and birthdate and procedure to be performed and side for sampling, if any, was specified. Pt was intubated by anesthesia.  The patient was appropriately draped.   Fiberoptic bronchoscopy with airway inspection and BAL Procedure findings:  Bronchoscope was inserted via ETT  without difficulty.  Posterior oropharynx,  epiglottis, arytenoids, false cords and vocal cords were not visualized as these were bypassed by endotracheal tube. The distal trachea was normal in circumference and appearance without mucosal, cartilaginous or branching abnormalities.  The main carina was mildly splayed . All right and left lobar airways were visualized to the Subsegmental level.  Sub- sub segmental carinae were identified in all the distal airways.   Secretions were visible in the following airways and appeared to be clear.  The mucosa was : friable at RUL  Airways were notable for:        exophytic lesions :n       extrinsic compression in the following distributions: n.       Friable mucosa: y       Neurosurgeon /pigmentation: n    Media Information         Document Information  Photos    04/15/2021 13:17  Attached To:  Hospital Encounter on 04/15/21   Media Information         Document Information  Photos    04/15/2021 13:17  Attached To:  Hospital Encounter on 04/15/21   Source Information  Ottie Glazier, MD  [facility]   Source Information  Ottie Glazier, MD  [facility]    Post procedure Diagnosis:     Multiple segments of mucous plugging as noted on CT chest with pictorial documentation included below. Right upper lobe is bifurcated instead of the normal trifurcation there is edema with significant narrowing of both segments in the right upper lobe     Electromagnetic Navigational Bronchoscopy Procedure Findings:  After appropriate CT-guided planning ENB scope was advanced via endotracheal tube and LG was advanced for registration.  Post appropriate planning and registration peripheral navigation was used  to visualize target lesion.    Post procedure diagnosis:   After target lesion was localized and confirmed location with fluoroscopy Cytobrush was done x6 followed by surgical forceps biopsy x5       Endobronchial ultrasound assisted hilar and  mediastinal lymph node biopsies procedure findings: The fiberoptic bronchoscope was removed and the EBUS scope was introduced. Examination began to evaluate for pathologically enlarged lymph nodes starting on the left side progressing to the right side.  All lymph node biopsies performed with 21-gauge needle. Lymph node biopsies were sent in cytolite for all stations.  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   Total dosage of Lidocaine was NONE mg Total fluoroscopy time was 1.6 minutes   Estimated Blood loss: 10cc.  Complications included: None  Preliminary CXR findings :  Performed  Disposition: I reviewed findings with Mr. Olivia Buchanan patient's spouse and patient will have follow-up in [facility] pulmonary clinic within 1 week.  Follow up with Dr. Lanney Gins in 5 days for result discussion.     Ottie Glazier MD  [facility]

## 2021-04-15 NOTE — Transfer of Care (Signed)
Immediate Anesthesia Transfer of Care Note  Patient: Seona D Beam [name]  Procedure(s) Performed: VIDEO BRONCHOSCOPY WITH ENDOBRONCHIAL ULTRASOUND VIDEO BRONCHOSCOPY WITH ENDOBRONCHIAL NAVIGATION  Patient Location: PACU  Anesthesia Type:General  Level of Consciousness: awake, alert  and oriented  Airway & Oxygen Therapy: Patient Spontanous Breathing and Patient connected to face mask oxygen  Post-op Assessment: Report given to RN and Post -op Vital signs reviewed and stable  Post vital signs: Reviewed and stable  Last Vitals:  Vitals Value Taken Time  BP 152/77 04/15/21 1504  Temp    Pulse 94 04/15/21 1504  Resp 16 04/15/21 1504  SpO2 98 % 04/15/21 1504  Vitals shown include unvalidated device data.  Last Pain:  Vitals:   04/15/21 1504  TempSrc:   PainSc: 0-No pain         Complications: No notable events documented.

## 2021-04-15 NOTE — H&P (Signed)
Pulmonary Medicine          Date: 04/15/2021,   MRN# [id number] Olivia Buchanan Olivia Buchanan 1951-06-12     Admission                  Current     Referring physician: Dr. Doy Hutching  CHIEF COMPLAINT:   Right upper lobe mass with hilar and mediastinal lymphadenopathy   HISTORY OF PRESENT ILLNESS   This is a pleasant 70 year old female with a history of advanced COPD, depression, GERD, hypertension, dyslipidemia, obesity, anxiety disorder who was evaluated for shortness of breath which was progressive unresponsive to typical COPD care path.  Additionally had imaging of the chest which showed a nodule of the right upper lobe.  Patient subsequently had CT chest which showed 3 cm right upper lobe mass with hilar and mediastinal lymphadenopathy.  We reviewed this together and planned on obtaining tissue biopsy for definitive diagnosis.  Patient is coming in today for same-day surgery to have bronchoscopic evaluation with navigational bronchoscopy and endobronchial ultrasound assisted lymph node biopsies.   PAST MEDICAL HISTORY   Past Medical History:  Diagnosis Date   Angioedema 12/08/2019   Anxiety    Aortic atherosclerosis (HCC)    CAD (coronary artery disease)    3 vessel   COPD (chronic obstructive pulmonary disease) ([place]) 12/08/2019   DDD (degenerative disc disease), lumbar    Depression    Diabetes mellitus without complication (HCC)    GERD (gastroesophageal reflux disease)    HTN (hypertension) 12/08/2019   Hyperlipidemia    Hypertension    Mass of upper lobe of right lung 04/02/2021   spiculated mass RIGHT posterior pulmonary apex with associated hilar and pretracheal LAD; measured 3.2 x 2.8 cm   Obesity (BMI 30-39.9)    T2DM (type 2 diabetes mellitus) ([place]) 12/08/2019     SURGICAL HISTORY   Past Surgical History:  Procedure Laterality Date   BACK SURGERY     lumbar L5-S1 ruptured disc x 3 surgeries   CERVICAL FUSION  2009   LAPAROSCOPIC TOTAL HYSTERECTOMY   1998   with oophorectomy   TONSILLECTOMY       FAMILY HISTORY   No family history on file.   SOCIAL HISTORY   Social History   Tobacco Use   Smoking status: Former    Years: 50.00    Pack years: 0.00    Types: Cigarettes    Quit date: 2019    Years since quitting: 3.5   Smokeless tobacco: Never  Vaping Use   Vaping Use: Never used  Substance Use Topics   Alcohol use: Not Currently   Drug use: Never     MEDICATIONS    Home Medication:  Current Outpatient Rx   Order #: [id number] Class: Historical Med   Order #: [id number] Class: Historical Med   Order #: [id number] Class: Historical Med   Order #: [id number] Class: Historical Med   Order #: [id number] Class: Historical Med   Order #: [id number] Class: Normal   Order #: [id number] Class: Historical Med   Order #: [id number] Class: Historical Med   Order #: [id number] Class: Historical Med   Order #: [id number] Class: Historical Med   Order #: [id number] Class: Historical Med   Order #: [id number] Class: Historical Med   Order #: [id number] Class: Historical Med   Order #: [id number] Class: Historical Med   Order #: [id number] Class: Historical Med   Order #: [id number] Class: Historical Med   Order #: [id number] Class: Historical Med   Order #: [id number] Class: Historical Med   Order #:  [id number] Class: Historical Med   Order #: [id number] Class: Historical Med   Order #: [id number] Class: Historical Med    Current Medication:  Current Facility-Administered Medications:    0.9 %  sodium chloride infusion, , Intravenous, Continuous, Adams, Alvina Filbert, MD   chlorhexidine (PERIDEX) 0.12 % solution 15 mL, 15 mL, Mouth/Throat, Once **OR** MEDLINE mouth rinse, 15 mL, Mouth Rinse, Once, Molli Barrows, MD  Current Outpatient Medications:    albuterol (VENTOLIN HFA) 108 (90 Base) MCG/ACT inhaler, Inhale 1 puff into the lungs every 6 (six) hours as needed for wheezing., Disp: , Rfl:    Ascorbic Acid (VITAMIN C) 1000 MG tablet, Take 1,000 mg by mouth in  the morning and at bedtime., Disp: , Rfl:    aspirin EC 81 MG tablet, Take 81 mg by mouth at bedtime., Disp: , Rfl:    bisoprolol-hydrochlorothiazide (ZIAC) 5-6.25 MG tablet, Take 1 tablet by mouth daily., Disp: , Rfl:    clonazePAM (KLONOPIN) 0.5 MG tablet, Take 0.5 mg by mouth 3 (three) times daily as needed for anxiety., Disp: , Rfl:    EPINEPHrine 0.3 mg/0.3 mL IJ SOAJ injection, Inject 0.3 mLs (0.3 mg total) into the muscle as needed for anaphylaxis., Disp: 2 each, Rfl: 0   gabapentin (NEURONTIN) 300 MG capsule, Take 300 mg by mouth 3 (three) times daily., Disp: , Rfl:    hydrochlorothiazide (HYDRODIURIL) 25 MG tablet, Take 25 mg by mouth daily., Disp: , Rfl:    HYDROcodone-acetaminophen (NORCO) 10-325 MG tablet, Take 1 tablet by mouth 5 (five) times daily., Disp: , Rfl:    ipratropium-albuterol (DUONEB) 0.5-2.5 (3) MG/3ML SOLN, Take 3 mLs by nebulization in the morning, at noon, in the evening, and at bedtime., Disp: , Rfl:    loratadine-pseudoephedrine (CLARITIN-D 24-HOUR) 10-240 MG 24 hr tablet, Take 1 tablet by mouth daily., Disp: , Rfl:    Melatonin 10 MG TABS, Take 10 mg by mouth at bedtime., Disp: , Rfl:    Multiple Vitamin (MULTI-VITAMIN) tablet, Take 1 tablet by mouth daily., Disp: , Rfl:    nystatin cream (MYCOSTATIN), Apply 1 application topically 2 (two) times daily as needed for dry skin (around lips)., Disp: , Rfl:    pantoprazole (PROTONIX) 40 MG tablet, Take 40 mg by mouth at bedtime. , Disp: , Rfl:    potassium chloride SA (KLOR-CON) 20 MEQ tablet, Take 20 mEq by mouth 2 (two) times daily., Disp: , Rfl:    pravastatin (PRAVACHOL) 80 MG tablet, Take 80 mg by mouth at bedtime. , Disp: , Rfl:    Probiotic Product (PROBIOTIC DAILY PO), Take 1 capsule by mouth daily., Disp: , Rfl:    tiZANidine (ZANAFLEX) 4 MG tablet, Take 4 mg by mouth 3 (three) times daily., Disp: , Rfl:    venlafaxine XR (EFFEXOR-XR) 75 MG 24 hr capsule, Take 150 mg by mouth at bedtime., Disp: , Rfl:     fluticasone-salmeterol (ADVAIR) 250-50 MCG/ACT AEPB, Inhale 1 puff into the lungs in the morning and at bedtime., Disp: , Rfl:     ALLERGIES   Ibuprofen and Macrobid [nitrofurantoin]     REVIEW OF SYSTEMS    Review of Systems:  Gen:  Denies  fever, sweats, chills weigh loss  HEENT: Denies blurred vision, double vision, ear pain, eye pain, hearing loss, nose bleeds, sore throat Cardiac:  No dizziness, chest pain or heaviness, chest tightness,edema Resp:   Denies cough or sputum porduction, shortness of breath,wheezing, hemoptysis,  Gi: Denies swallowing difficulty, stomach pain, nausea or vomiting, diarrhea, constipation,  bowel incontinence Gu:  Denies bladder incontinence, burning urine Ext:   Denies Joint pain, stiffness or swelling Skin: Denies  skin rash, easy bruising or bleeding or hives Endoc:  Denies polyuria, polydipsia , polyphagia or weight change Psych:   Denies depression, insomnia or hallucinations   Other:  All other systems negative   VS: There were no vitals taken for this visit.     PHYSICAL EXAM    GENERAL:NAD, no fevers, chills, no weakness no fatigue HEAD: Normocephalic, atraumatic.  EYES: Pupils equal, round, reactive to light. Extraocular muscles intact. No scleral icterus.  MOUTH: Moist mucosal membrane. Dentition intact. No abscess noted.  EAR, NOSE, THROAT: Clear without exudates. No external lesions.  NECK: Supple. No thyromegaly. No nodules. No JVD.  PULMONARY: decreased breath sounds bilaterally.  CARDIOVASCULAR: S1 and S2. Regular rate and rhythm. No murmurs, rubs, or gallops. No edema. Pedal pulses 2+ bilaterally.  GASTROINTESTINAL: Soft, nontender, nondistended. No masses. Positive bowel sounds. No hepatosplenomegaly.  MUSCULOSKELETAL: No swelling, clubbing, or edema. Range of motion full in all extremities.  NEUROLOGIC: Cranial nerves II through XII are intact. No gross focal neurological deficits. Sensation intact. Reflexes intact.   SKIN: No ulceration, lesions, rashes, or cyanosis. Skin warm and dry. Turgor intact.  PSYCHIATRIC: Mood, affect within normal limits. The patient is awake, alert and oriented x 3. Insight, judgment intact.       IMAGING    CT CHEST W CONTRAST  Result Date: 04/02/2021 CLINICAL DATA:  Right apical pulmonary nodule, shortness of breath EXAM: CT CHEST WITH CONTRAST TECHNIQUE: Multidetector CT imaging of the chest was performed during intravenous contrast administration. CONTRAST:  66mL OMNIPAQUE IOHEXOL 300 MG/ML  SOLN COMPARISON:  None. FINDINGS: Cardiovascular: Aortic atherosclerosis. Normal heart size. Three-vessel coronary artery calcifications. No pericardial effusion. Mediastinum/Nodes: Enlarged right hilar and pretracheal lymph nodes measuring up to 1.6 x 1.5 cm (series 2, image 49). Thyroid gland, trachea, and esophagus demonstrate no significant findings. Lungs/Pleura: There is a spiculated mass of the posterior right pulmonary apex measuring 3.2 x 2.8 cm (series 3, image 26). Background of very fine centrilobular nodularity throughout the lungs, most concentrated at the apices. Scattered bronchiolar plugging. Bandlike scarring and or atelectasis of the bilateral lung bases. No pleural effusion or pneumothorax. Upper Abdomen: No acute abnormality. Coarse, nodular contour of the liver in the included upper abdomen. Musculoskeletal: No chest wall mass or suspicious bone lesions identified. IMPRESSION: 1. Spiculated mass of the posterior right pulmonary apex measuring 3.2 x 2.8 cm, consistent with primary lung malignancy. 2. Enlarged right hilar and pretracheal lymph nodes measuring up to 1.6 x 1.5 cm, suspicious for nodal metastatic disease. 3. Recommend multidisciplinary thoracic referral for consideration of PET-CT metabolic characterization and tissue sampling. 4. Background of very fine centrilobular nodularity throughout the lungs, most concentrated at the apices. Scattered bronchiolar plugging.  Findings are most consistent with smoking-related respiratory bronchiolitis. 5. Coarse, nodular contour of the liver in the included upper abdomen, suggestive of cirrhosis. 6. Coronary artery disease. These results will be called to the ordering clinician or representative by the Radiologist Assistant, and communication documented in the PACS or [employer]. Aortic Atherosclerosis (ICD10-I70.0). Electronically Signed   By: Eddie Candle M.D.   On: 04/02/2021 15:43      ASSESSMENT/PLAN   Right upper lobe lung mass with mediastinal/hilar lymphadenopathy -Patient comes in to have navigational bronchoscopy and endobronchial ultrasound assisted lymph node biopsies for suspicion of bronchogenic carcinoma. -Reviewed procedure in detail patient understands, questions have been answered plan to  proceed with scheduled surgery   -Reviewed risks/complications and benefits with patient, risks include infection, pneumothorax/pneumomediastinum which may require chest tube placement as well as overnight/prolonged hospitalization and possible mechanical ventilation. Other risks include bleeding and very rarely death.  Patient understands risks and wishes to proceed.  Additional questions were answered, and patient is aware that post procedure patient will be going home with family and may experience cough with possible clots on expectoration as well as phlegm which may last few days as well as hoarseness of voice post intubation and mechanical ventilation.     Thank you for allowing me to participate in the care of this patient.   Total face to face encounter time for this patient visit was 45 min. >50% of the time was  spent in counseling and coordination of care.    Patient/Family are satisfied with care plan and all questions have been answered.  This document was prepared using Dragon voice recognition software and may include unintentional dictation errors.     Ottie Glazier, M.D.  Division of  [place]

## 2021-04-15 NOTE — Anesthesia Preprocedure Evaluation (Signed)
Anesthesia Evaluation  Patient identified by MRN, date of birth, ID band Patient awake    Reviewed: Allergy & Precautions, NPO status , Patient's Chart, lab work & pertinent test results  Airway Mallampati: II  TM Distance: >3 FB Neck ROM: Full    Dental  (+) Edentulous Upper, Edentulous Lower   Pulmonary COPD,  COPD inhaler, former smoker,    Pulmonary exam normal        Cardiovascular hypertension, Pt. on medications + CAD  Normal cardiovascular exam     Neuro/Psych PSYCHIATRIC DISORDERS Anxiety Depression negative neurological ROS     GI/Hepatic Neg liver ROS, GERD  Medicated,  Endo/Other  diabetesMorbid obesity  Renal/GU negative Renal ROS  negative genitourinary   Musculoskeletal  (+) Arthritis , Osteoarthritis,    Abdominal   Peds negative pediatric ROS (+)  Hematology negative hematology ROS (+)   Anesthesia Other Findings Angioedema 12/08/2019   Anxiety    COPD (chronic obstructive pulmonary disease) ([place]) 12/08/2019  DDD (degenerative disc disease), lumbar Depression    Diabetes mellitus without complication (HCC)    GERD (gastroesophageal reflux disease) HTN (hypertension) 12/08/2019  Hyperlipidemia    Hypertension    Obesity (BMI 30-39.9)    T2DM (type 2 diabetes mellitus) ([place]) 12/08/2019      Reproductive/Obstetrics negative OB ROS                            Anesthesia Physical Anesthesia Plan  ASA: 3  Anesthesia Plan: General   Post-op Pain Management:    Induction: Intravenous  PONV Risk Score and Plan: 2 and Propofol infusion and Midazolam  Airway Management Planned: Oral ETT  Additional Equipment:   Intra-op Plan:   Post-operative Plan: Extubation in OR  Informed Consent: I have reviewed the patients History and Physical, chart, labs and discussed the procedure including the risks, benefits and alternatives for the proposed anesthesia with the  patient or authorized representative who has indicated his/her understanding and acceptance.       Plan Discussed with: CRNA, Anesthesiologist and Surgeon  Anesthesia Plan Comments:         Anesthesia Quick Evaluation

## 2021-04-15 NOTE — Progress Notes (Signed)
Dr. Bertell Maria and Dr. Lanney Gins notified of sats 87-88% on RA. Per both providers ok to go home, duoneb prior to discharge. Patient awake/alert x4, no c/o's

## 2021-04-15 NOTE — Discharge Instructions (Addendum)
Per Dr. Lanney Gins, post bronch chest film completed, OK to be discharged to home when ready. May eat tonight, restart medications in am, may restart aspirin in am.  AMBULATORY SURGERY  DISCHARGE INSTRUCTIONS   The drugs that you were given will stay in your system until tomorrow so for the next 24 hours you should not:  Drive an automobile Make any legal decisions Drink any alcoholic beverage   You may resume regular meals tomorrow.  Today it is better to start with liquids and gradually work up to solid foods.  You may eat anything you prefer, but it is better to start with liquids, then soup and crackers, and gradually work up to solid foods.   Please notify your doctor immediately if you have any unusual bleeding, trouble breathing, redness and pain at the surgery site, drainage, fever, or pain not relieved by medication.    Additional Instructions: Please contact your physician with any problems or Same Day Surgery at [phone], Monday through Friday 6 am to 4 pm, or [place] at [facility] number at [phone].

## 2021-04-15 NOTE — Anesthesia Procedure Notes (Signed)
Procedure Name: Intubation Date/Time: 04/15/2021 1:07 PM Performed by: Hedda Slade, CRNA Pre-anesthesia Checklist: Patient identified, Patient being monitored, Timeout performed, Emergency Drugs available and Suction available Patient Re-evaluated:Patient Re-evaluated prior to induction Oxygen Delivery Method: Circle system utilized Preoxygenation: Pre-oxygenation with 100% oxygen Induction Type: IV induction Ventilation: Mask ventilation without difficulty and Oral airway inserted - appropriate to patient size Laryngoscope Size: 3 and McGraph Grade View: Grade I Tube type: Oral Tube size: 9.0 mm Number of attempts: 1 Airway Equipment and Method: Stylet and Video-laryngoscopy Placement Confirmation: ETT inserted through vocal cords under direct vision, positive ETCO2 and breath sounds checked- equal and bilateral Secured at: 21 cm Tube secured with: Tape Dental Injury: Teeth and Oropharynx as per pre-operative assessment

## 2021-04-16 ENCOUNTER — Encounter: Payer: Self-pay | Admitting: Pulmonary Disease

## 2021-04-16 NOTE — Anesthesia Postprocedure Evaluation (Signed)
Anesthesia Post Note  Patient: Donna D Beam Jones  Procedure(s) Performed: VIDEO BRONCHOSCOPY WITH ENDOBRONCHIAL ULTRASOUND VIDEO BRONCHOSCOPY WITH ENDOBRONCHIAL NAVIGATION  Patient location during evaluation: PACU Anesthesia Type: General Level of consciousness: awake and alert, awake and oriented Pain management: pain level controlled Vital Signs Assessment: post-procedure vital signs reviewed and stable Respiratory status: spontaneous breathing, nonlabored ventilation and respiratory function stable Cardiovascular status: blood pressure returned to baseline and stable Postop Assessment: no apparent nausea or vomiting Anesthetic complications: no   No notable events documented.   Last Vitals:  Vitals:   04/15/21 1645 04/15/21 1650  BP: (!) 126/41   Pulse: 100   Resp: 18   Temp: (!) 36.1 C   SpO2:  95%    Last Pain:  Vitals:   04/15/21 1645  TempSrc: Temporal  PainSc: 0-No pain                 Phill Mutter

## 2021-04-17 ENCOUNTER — Encounter: Payer: Self-pay | Admitting: *Deleted

## 2021-04-17 ENCOUNTER — Other Ambulatory Visit: Payer: Self-pay | Admitting: Pathology

## 2021-04-17 DIAGNOSIS — C3490 Malignant neoplasm of unspecified part of unspecified bronchus or lung: Secondary | ICD-10-CM

## 2021-04-17 LAB — CYTOLOGY - NON PAP

## 2021-04-17 LAB — SURGICAL PATHOLOGY

## 2021-04-21 ENCOUNTER — Encounter: Payer: Self-pay | Admitting: *Deleted

## 2021-04-21 NOTE — Progress Notes (Signed)
Phone call made to patient to introduce to navigator services and rewview upcoming appts. Pt made aware of PET scan appt on 7/21 at 930am. Instructed to arrive at 9am at the [facility] and remain NPO 6 hours before except water only. Contact info given and instructed to call with any further questions or needs. Pt verbalized understanding. Nothing further needed at this time.

## 2021-04-23 ENCOUNTER — Encounter: Admission: RE | Admit: 2021-04-23 | Payer: Medicare HMO | Source: Ambulatory Visit

## 2021-04-24 ENCOUNTER — Inpatient Hospital Stay: Payer: Medicare HMO

## 2021-04-24 ENCOUNTER — Encounter: Payer: Self-pay | Admitting: Internal Medicine

## 2021-04-24 ENCOUNTER — Inpatient Hospital Stay: Payer: Medicare HMO | Attending: Internal Medicine | Admitting: Internal Medicine

## 2021-04-24 ENCOUNTER — Encounter: Payer: Self-pay | Admitting: *Deleted

## 2021-04-24 ENCOUNTER — Other Ambulatory Visit: Payer: Self-pay

## 2021-04-24 VITALS — BP 136/69 | HR 73 | Temp 98.6°F | Resp 22 | Ht 59.0 in | Wt 186.0 lb

## 2021-04-24 DIAGNOSIS — Z79899 Other long term (current) drug therapy: Secondary | ICD-10-CM | POA: Diagnosis not present

## 2021-04-24 DIAGNOSIS — G8929 Other chronic pain: Secondary | ICD-10-CM | POA: Insufficient documentation

## 2021-04-24 DIAGNOSIS — C3411 Malignant neoplasm of upper lobe, right bronchus or lung: Secondary | ICD-10-CM

## 2021-04-24 DIAGNOSIS — K219 Gastro-esophageal reflux disease without esophagitis: Secondary | ICD-10-CM | POA: Diagnosis not present

## 2021-04-24 DIAGNOSIS — M549 Dorsalgia, unspecified: Secondary | ICD-10-CM | POA: Diagnosis not present

## 2021-04-24 DIAGNOSIS — I2510 Atherosclerotic heart disease of native coronary artery without angina pectoris: Secondary | ICD-10-CM | POA: Diagnosis not present

## 2021-04-24 DIAGNOSIS — R202 Paresthesia of skin: Secondary | ICD-10-CM | POA: Insufficient documentation

## 2021-04-24 DIAGNOSIS — R0602 Shortness of breath: Secondary | ICD-10-CM | POA: Diagnosis not present

## 2021-04-24 DIAGNOSIS — R200 Anesthesia of skin: Secondary | ICD-10-CM | POA: Diagnosis not present

## 2021-04-24 DIAGNOSIS — E669 Obesity, unspecified: Secondary | ICD-10-CM | POA: Insufficient documentation

## 2021-04-24 DIAGNOSIS — M7989 Other specified soft tissue disorders: Secondary | ICD-10-CM | POA: Insufficient documentation

## 2021-04-24 DIAGNOSIS — J449 Chronic obstructive pulmonary disease, unspecified: Secondary | ICD-10-CM | POA: Insufficient documentation

## 2021-04-24 DIAGNOSIS — E119 Type 2 diabetes mellitus without complications: Secondary | ICD-10-CM | POA: Insufficient documentation

## 2021-04-24 DIAGNOSIS — C3490 Malignant neoplasm of unspecified part of unspecified bronchus or lung: Secondary | ICD-10-CM

## 2021-04-24 LAB — CBC WITH DIFFERENTIAL/PLATELET
Abs Immature Granulocytes: 0.04 10*3/uL (ref 0.00–0.07)
Basophils Absolute: 0.1 10*3/uL (ref 0.0–0.1)
Basophils Relative: 1 %
Eosinophils Absolute: 0.3 10*3/uL (ref 0.0–0.5)
Eosinophils Relative: 3 %
HCT: 39.1 % (ref 36.0–46.0)
Hemoglobin: 12.8 g/dL (ref 12.0–15.0)
Immature Granulocytes: 0 %
Lymphocytes Relative: 28 %
Lymphs Abs: 2.6 10*3/uL (ref 0.7–4.0)
MCH: 30 pg (ref 26.0–34.0)
MCHC: 32.7 g/dL (ref 30.0–36.0)
MCV: 91.6 fL (ref 80.0–100.0)
Monocytes Absolute: 0.8 10*3/uL (ref 0.1–1.0)
Monocytes Relative: 8 %
Neutro Abs: 5.6 10*3/uL (ref 1.7–7.7)
Neutrophils Relative %: 60 %
Platelets: 271 10*3/uL (ref 150–400)
RBC: 4.27 MIL/uL (ref 3.87–5.11)
RDW: 12.7 % (ref 11.5–15.5)
WBC: 9.3 10*3/uL (ref 4.0–10.5)
nRBC: 0 % (ref 0.0–0.2)

## 2021-04-24 LAB — COMPREHENSIVE METABOLIC PANEL
ALT: 19 U/L (ref 0–44)
AST: 42 U/L — ABNORMAL HIGH (ref 15–41)
Albumin: 3.7 g/dL (ref 3.5–5.0)
Alkaline Phosphatase: 56 U/L (ref 38–126)
Anion gap: 10 (ref 5–15)
BUN: 8 mg/dL (ref 8–23)
CO2: 28 mmol/L (ref 22–32)
Calcium: 8.6 mg/dL — ABNORMAL LOW (ref 8.9–10.3)
Chloride: 98 mmol/L (ref 98–111)
Creatinine, Ser: 0.59 mg/dL (ref 0.44–1.00)
GFR, Estimated: >60 mL/min (ref >60)
Glucose, Bld: 132 mg/dL — ABNORMAL HIGH (ref 70–99)
Potassium: 3.5 mmol/L (ref 3.5–5.1)
Sodium: 136 mmol/L (ref 135–145)
Total Bilirubin: 0.5 mg/dL (ref 0.3–1.2)
Total Protein: 8.3 g/dL — ABNORMAL HIGH (ref 6.5–8.1)

## 2021-04-24 MED ORDER — ONDANSETRON HCL 8 MG PO TABS: 8.0000 mg | ORAL_TABLET | Freq: Three times a day (TID) | ORAL | 1 refills | Status: DC | PRN

## 2021-04-24 NOTE — Progress Notes (Signed)
[facility] NOTE  Patient Care Team: Idelle Crouch, MD as PCP - General (Internal Medicine) Telford Nab, RN as Oncology Nurse Navigator  CHIEF COMPLAINTS/PURPOSE OF CONSULTATION: lung cancer  #  Oncology History Overview Note   1. Spiculated mass of the posterior right pulmonary apex measuring 3.2 x 2.8 cm, consistent with primary lung malignancy. 2. Enlarged right hilar and pretracheal lymph nodes measuring up to 1.6 x 1.5 cm, suspicious for nodal metastatic disease. 3. Recommend multidisciplinary thoracic referral for consideration of PET-CT metabolic characterization and tissue sampling. 4. Background of very fine centrilobular nodularity throughout the lungs, most concentrated at the apices. Scattered bronchiolar plugging. Findings are most consistent with smoking-related respiratory bronchiolitis. 5. Coarse, nodular contour of the liver in the included upper abdomen, suggestive of cirrhosis. 6. Coronary artery disease.  # COPD-  # DM- diet controlled; chronic back pain- hydrocodone 5 day.   # NGS/MOLECULAR TESTS:    # PALLIATIVE CARE EVALUATION:  # PAIN MANAGEMENT:    DIAGNOSIS:   STAGE:         ;  GOALS:  CURRENT/MOST RECENT THERAPY :     Malignant neoplasm of upper lobe of right lung ([place])  04/24/2021 Initial Diagnosis   Malignant neoplasm of upper lobe of right lung (HCC)      HISTORY OF PRESENTING ILLNESS:  Olivia Buchanan 70 y.o.  female history of smoking [currently quit] is here for further evaluation and recommendations for newly diagnosed lung cancer.  Patient noted to have worsening shortness of breath and cough for the last few months.  Which led to further imaging with a CT scan that showed a right upper lobe lung mass/and mediastinal adenopathy.  This led to a evaluation with pulmonary/bronchoscopy showed positive for malignancy.  She complains of weight loss.  Complains of nausea with intermittent headaches.  No  falls.  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 Negative for chills, diaphoresis and fever.  HENT:  Negative for nosebleeds and sore throat.   Eyes:  Negative for double vision.  Respiratory:  Positive for cough, sputum production and shortness of breath. Negative for hemoptysis and wheezing.   Cardiovascular:  Negative for chest pain, palpitations, orthopnea and leg swelling.  Gastrointestinal:  Positive for nausea. Negative for abdominal pain, blood in stool, constipation, diarrhea, heartburn, melena and vomiting.  Genitourinary:  Negative for dysuria, frequency and urgency.  Musculoskeletal:  Positive for back pain, joint pain and neck pain.  Skin: Negative.  Negative for itching and rash.  Neurological:  Positive for dizziness and headaches. Negative for tingling, focal weakness and weakness.  Endo/Heme/Allergies:  Does not bruise/bleed easily.  Psychiatric/Behavioral:  Negative for depression. The patient is not nervous/anxious and does not have insomnia.     MEDICAL HISTORY:  Past Medical History:  Diagnosis Date   Angioedema 12/08/2019   Anxiety    Aortic atherosclerosis (HCC)    CAD (coronary artery disease)    3 vessel   COPD (chronic obstructive pulmonary disease) ([place]) 12/08/2019   DDD (degenerative disc disease), lumbar    Depression    Diabetes mellitus without complication (HCC)    GERD (gastroesophageal reflux disease)    HTN (hypertension) 12/08/2019   Hyperlipidemia    Hypertension    Mass of upper lobe of right lung 04/02/2021   spiculated  mass RIGHT posterior pulmonary apex with associated hilar and pretracheal LAD; measured 3.2 x 2.8 cm   Obesity (BMI 30-39.9)    T2DM (type 2 diabetes mellitus) ([place]) 12/08/2019    SURGICAL HISTORY: Past Surgical History:  Procedure  Laterality Date   BACK SURGERY     lumbar L5-S1 ruptured disc x 3 surgeries   CERVICAL FUSION  2009   LAPAROSCOPIC TOTAL HYSTERECTOMY  1998   with oophorectomy   TONSILLECTOMY     VIDEO BRONCHOSCOPY WITH ENDOBRONCHIAL NAVIGATION N/A 04/15/2021   Procedure: VIDEO BRONCHOSCOPY WITH ENDOBRONCHIAL NAVIGATION;  Surgeon: Ottie Glazier, MD;  Location: [facility] ORS;  Service: Thoracic;  Laterality: N/A;   VIDEO BRONCHOSCOPY WITH ENDOBRONCHIAL ULTRASOUND N/A 04/15/2021   Procedure: VIDEO BRONCHOSCOPY WITH ENDOBRONCHIAL ULTRASOUND;  Surgeon: Ottie Glazier, MD;  Location: [facility] ORS;  Service: Thoracic;  Laterality: N/A;    SOCIAL HISTORY: Social History   Socioeconomic History   Marital status: Married    Spouse name: Dwight   Number of children: 3   Years of education: Not on file   Highest education level: Not on file  Occupational History   Not on file  Tobacco Use   Smoking status: Former    Years: 50.00    Types: Cigarettes    Quit date: 2019    Years since quitting: 3.5   Smokeless tobacco: Never  Vaping Use   Vaping Use: Never used  Substance and Sexual Activity   Alcohol use: Not Currently   Drug use: Never   Sexual activity: Not on file  Other Topics Concern   Not on file  Social History Narrative   Lives in home; with husband; lives in [place]; never smoking 2019; no alcohol. 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    Social Determinants of Health   Financial Resource Strain: Not on file  Food Insecurity: Not on file  Transportation Needs: Not on file  Physical Activity: Not on file  Stress: Not on file  Social Connections: Not on file  Intimate Partner Violence: Not on file    FAMILY HISTORY: Family History  Problem Relation Age of Onset   Bone cancer Maternal Uncle     ALLERGIES:  is allergic to ibuprofen and macrobid [nitrofurantoin].  MEDICATIONS:  Current Outpatient Medications  Medication Sig Dispense Refill   albuterol  (VENTOLIN HFA) 108 (90 Base) MCG/ACT inhaler Inhale 1 puff into the lungs every 6 (six) hours as needed for wheezing.     Ascorbic Acid (VITAMIN C) 1000 MG tablet Take 1,000 mg by mouth in the morning and at bedtime.     aspirin EC 81 MG tablet Take 81 mg by mouth at bedtime.     bisoprolol-hydrochlorothiazide (ZIAC) 5-6.25 MG tablet Take 1 tablet by mouth daily.     clonazePAM (KLONOPIN) 0.5 MG tablet Take 0.5 mg by mouth 3 (three) times daily as needed for anxiety.     gabapentin (NEURONTIN) 300 MG capsule Take 300 mg by mouth 3 (three) times daily.     hydrochlorothiazide (HYDRODIURIL) 25 MG tablet Take 25 mg by mouth daily.     HYDROcodone-acetaminophen (NORCO) 10-325 MG tablet Take 1 tablet by mouth 5 (five) times daily.     ipratropium-albuterol (DUONEB) 0.5-2.5 (3) MG/3ML SOLN Take 3 mLs by nebulization in the morning, at noon, in the evening, and at bedtime.     loratadine-pseudoephedrine (CLARITIN-D 24-HOUR) 10-240 MG 24 hr tablet Take 1 tablet by mouth daily.     Melatonin 10 MG TABS Take 10  mg by mouth at bedtime.     Multiple Vitamin (MULTI-VITAMIN) tablet Take 1 tablet by mouth daily.     nystatin cream (MYCOSTATIN) Apply 1 application topically 2 (two) times daily as needed for dry skin (around lips).     pantoprazole (PROTONIX) 40 MG tablet Take 40 mg by mouth at bedtime.      potassium chloride SA (KLOR-CON) 20 MEQ tablet Take 20 mEq by mouth 2 (two) times daily.     pravastatin (PRAVACHOL) 80 MG tablet Take 80 mg by mouth at bedtime.      Probiotic Product (PROBIOTIC DAILY PO) Take 1 capsule by mouth daily.     tiZANidine (ZANAFLEX) 4 MG tablet Take 4 mg by mouth 3 (three) times daily.     venlafaxine XR (EFFEXOR-XR) 75 MG 24 hr capsule Take 150 mg by mouth at bedtime.     EPINEPHrine 0.3 mg/0.3 mL IJ SOAJ injection Inject 0.3 mLs (0.3 mg total) into the muscle as needed for anaphylaxis. (Patient not taking: Reported on 04/24/2021) 2 each 0   fluticasone-salmeterol (ADVAIR) 250-50  MCG/ACT AEPB Inhale 1 puff into the lungs in the morning and at bedtime.     No current facility-administered medications for this visit.      Marland Kitchen  PHYSICAL EXAMINATION: ECOG PERFORMANCE STATUS: 1 - Symptomatic but completely ambulatory  Vitals:   04/24/21 1111  BP: 136/69  Pulse: 73  Resp: (!) 22  Temp: 98.6 F (37 C)   Filed Weights   04/24/21 1111  Weight: 186 lb (84.4 kg)    Physical Exam Vitals and nursing note reviewed.  Constitutional:      Comments: Patient is accompanied by daughter.  She is a wheelchair. Marland Kitchen   HENT:     Head: Normocephalic and atraumatic.     Mouth/Throat:     Mouth: Mucous membranes are moist.     Pharynx: Oropharynx is clear. No oropharyngeal exudate.  Eyes:     Extraocular Movements: Extraocular movements intact.     Pupils: Pupils are equal, round, and reactive to light.  Cardiovascular:     Rate and Rhythm: Normal rate and regular rhythm.  Pulmonary:     Effort: No respiratory distress.     Breath sounds: No wheezing.     Comments: Decreased breath sounds bilaterally at bases.  No wheeze or crackles Abdominal:     General: Bowel sounds are normal. There is no distension.     Palpations: Abdomen is soft. There is no mass.     Tenderness: There is no abdominal tenderness. There is no guarding or rebound.  Musculoskeletal:        General: No tenderness. Normal range of motion.     Cervical back: Normal range of motion and neck supple.  Skin:    General: Skin is warm.  Neurological:     General: No focal deficit present.     Mental Status: She is alert and oriented to person, place, and time.     Comments: 4-5 strength in the left lower extremity/chronic.  Psychiatric:        Mood and Affect: Affect normal.        Behavior: Behavior normal.        Judgment: Judgment normal.     LABORATORY DATA:  I have reviewed the data as listed Lab Results  Component Value Date   WBC 9.4 09/18/2008   HGB 15.3 (H) 09/18/2008   HCT 45.5  09/18/2008   MCV 96.5 09/18/2008   PLT  186 09/18/2008   Recent Labs    04/14/21 0910  NA 137  K 3.4*  CL 99  CO2 26  GLUCOSE 159*  BUN 8  CREATININE 0.57  CALCIUM 8.7*  GFRNONAA >60    RADIOGRAPHIC STUDIES: I have personally reviewed the radiological images as listed and agreed with the findings in the report. CT CHEST W CONTRAST  Result Date: 04/02/2021 CLINICAL DATA:  Right apical pulmonary nodule, shortness of breath EXAM: CT CHEST WITH CONTRAST TECHNIQUE: Multidetector CT imaging of the chest was performed during intravenous contrast administration. CONTRAST:  54mL OMNIPAQUE IOHEXOL 300 MG/ML  SOLN COMPARISON:  None. FINDINGS: Cardiovascular: Aortic atherosclerosis. Normal heart size. Three-vessel coronary artery calcifications. No pericardial effusion. Mediastinum/Nodes: Enlarged right hilar and pretracheal lymph nodes measuring up to 1.6 x 1.5 cm (series 2, image 49). Thyroid gland, trachea, and esophagus demonstrate no significant findings. Lungs/Pleura: There is a spiculated mass of the posterior right pulmonary apex measuring 3.2 x 2.8 cm (series 3, image 26). Background of very fine centrilobular nodularity throughout the lungs, most concentrated at the apices. Scattered bronchiolar plugging. Bandlike scarring and or atelectasis of the bilateral lung bases. No pleural effusion or pneumothorax. Upper Abdomen: No acute abnormality. Coarse, nodular contour of the liver in the included upper abdomen. Musculoskeletal: No chest wall mass or suspicious bone lesions identified. IMPRESSION: 1. Spiculated mass of the posterior right pulmonary apex measuring 3.2 x 2.8 cm, consistent with primary lung malignancy. 2. Enlarged right hilar and pretracheal lymph nodes measuring up to 1.6 x 1.5 cm, suspicious for nodal metastatic disease. 3. Recommend multidisciplinary thoracic referral for consideration of PET-CT metabolic characterization and tissue sampling. 4. Background of very fine centrilobular  nodularity throughout the lungs, most concentrated at the apices. Scattered bronchiolar plugging. Findings are most consistent with smoking-related respiratory bronchiolitis. 5. Coarse, nodular contour of the liver in the included upper abdomen, suggestive of cirrhosis. 6. Coronary artery disease. These results will be called to the ordering clinician or representative by the Radiologist Assistant, and communication documented in the PACS or [employer]. Aortic Atherosclerosis (ICD10-I70.0). Electronically Signed   By: Eddie Candle M.D.   On: 04/02/2021 15:43   DG Chest Port 1 View  Result Date: 04/15/2021 CLINICAL DATA:  Status post bronchoscopy.  RIGHT upper lobe mass. EXAM: PORTABLE CHEST 1 VIEW COMPARISON:  CT 04/16/2019 FINDINGS: The RIGHT upper lobe density is increased compared to prior measuring 5.2 x 4.4 cm compared to 2.2 by 2.9 cm on CT. Findings suggest hematoma related to the bronchoscopy. No evidence of pneumothorax. No atelectasis. Normal cardiac silhouette. Mild venous congestion. Anterior cervical fusion IMPRESSION: 1. Expansion of the RIGHT upper lobe nodule/mass following procedure consistent with small volume post procedural hematoma. Recommend follow-up radiograph. 2. No pneumothorax.  Atelectasis. Electronically Signed   By: Suzy Bouchard M.D.   On: 04/15/2021 16:36   DG C-Arm 1-60 Min-No Report  Result Date: 04/15/2021 Fluoroscopy was utilized by the requesting physician.  No radiographic interpretation.   CT Super D Chest Wo Contrast  Result Date: 04/15/2021 CLINICAL DATA:  Lung cancer, preoperative planning for bronchoscopy. EXAM: CT CHEST WITHOUT CONTRAST TECHNIQUE: Multidetector CT imaging of the chest was performed using thin slice collimation for electromagnetic bronchoscopy planning purposes, without intravenous contrast. COMPARISON:  04/02/2021 FINDINGS: Cardiovascular: Coronary, aortic arch, and branch vessel atherosclerotic vascular disease. Mediastinum/Nodes:  Lower paratracheal node anterior to the carina 1.4 cm in short axis on image 22 series 2, previously measured at 1.5 cm. Lungs/Pleura: 2.9 by 3.1 cm right  upper lobe mass with adjacent atelectasis extending to the pleural surface. Several small scattered new ground-glass density lesions in the lungs, favoring the upper lobes, not present on 04/02/2021. Index peripheral ground-glass density lesion 1.3 by 1.0 cm on image 19 series 3. Although similar findings can be seen in the setting of COVID, a SARS coronavirus 2 test on 04/14/2021 was negative. Upper Abdomen: Nodularity of the liver contour suggests cirrhosis. Abdominal aortic atherosclerotic calcification. Musculoskeletal: Lower cervical plate and screw fixator. Mild thoracic spondylosis. IMPRESSION: 1. Scattered new (since 04/02/2021) small ground-glass density nodules in the lungs favoring the upper lobes. Appearance suggests atypical infectious process although a coronavirus test yesterday was negative. 2. Stable 3.1 cm right upper lobe mass. 3. Hepatic cirrhosis. 4.  Aortic Atherosclerosis (ICD10-I70.0).  Coronary atherosclerosis. Electronically Signed   By: Van Clines M.D.   On: 04/15/2021 12:00    ASSESSMENT & PLAN:   Malignant neoplasm of upper lobe of right lung ([place]) # T2 N2- [paratracheal lymph node based on CT scan].  Awaiting staging PET scan.   #Assuming disease stage III disease/based on current imaging-I had Long discussion with the patient regarding the goal of treatment of stage III lung cancer-goal is cure; however ~ 20% of the patients are cured.  Stage III disease/involvement of the mediastinal lymph nodes-not a candidate for surgery.  #Discussed the role of concurrent chemoradiation [weekly carbotaxol with the daily radiation/Monday through Friday ~6 weeks].  Reviewed with the patient the potential side effects of radiation include skin rash; radiation esophagitis/pneumonitis.  Also discussed the potential risk of long-term  malignancies with radiation as per the husband's question for radiation-induced malignancies.  However the risk is quite small; that happens many years later.  Benefits of the treatment outweigh the risk at this time.  # Post chemoradiation-consolidation immunotherapy with durvalumab every 2 weeks is recommended based on data from [facility] clinical trial.  Discussed the potential side effects including but not limited to-increasing fatigue, nausea vomiting, diarrhea, hair loss, sores in the mouth, increase risk of infection etc.   #Nausea/headaches-question include intra-cranial pressure.  Recommend stat CT brain with and without contrast [claustrophobia to MRI]  # chronic back pain/chronic left LE/Bil PN-history of cervical spine injury. unclear if any worsening from underlying metastatic disease.  Continue hydrocodone [for now as per Dr. Doy Hutching.   #Diabetes diet controlled-needs to be monitored on chemotherapy/steroids  # # Chemotherapy education; port placement. Pt WILL Need Antiemetics-Zofran and Compazine; EMLA cream. Order next.    Thank you Dr.Aleskerov for allowing me to participate in the care of your pleasant patient. Please do not hesitate to contact me with questions or concerns in the interim.  Discussed with [facility].  # DISPOSITION: # labs- cbc/cmp/CEA # CT with and without contrast- brain STAT # port referral # chemo education # follow up in 1 week- MD;no labs- Dr.B  Cc: Dr.A/Dr.Sparks    All questions were answered. The patient knows to call the clinic with any problems, questions or concerns.       Cammie Sickle, MD 04/24/2021 12:30 PM
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
## 2021-04-24 NOTE — Assessment & Plan Note (Addendum)
#   T2 N2- [paratracheal lymph node based on CT scan].  Awaiting staging PET scan.   #Assuming disease stage III disease/based on current imaging-I had Long discussion with the patient regarding the goal of treatment of stage III lung cancer-goal is cure; however ~ 20% of the patients are cured.  Stage III disease/involvement of the mediastinal lymph nodes-not a candidate for surgery.  #Discussed the role of concurrent chemoradiation [weekly carbotaxol with the daily radiation/Monday through Friday ~6 weeks].  Reviewed with the patient the potential side effects of radiation include skin rash; radiation esophagitis/pneumonitis.  Also discussed the potential risk of long-term malignancies with radiation as per the husband's question for radiation-induced malignancies.  However the risk is quite small; that happens many years later.  Benefits of the treatment outweigh the risk at this time.  # Post chemoradiation-consolidation immunotherapy with durvalumab every 2 weeks is recommended based on data from [facility] clinical trial.  Discussed the potential side effects including but not limited to-increasing fatigue, nausea vomiting, diarrhea, hair loss, sores in the mouth, increase risk of infection etc.   #Nausea/headaches-question include intra-cranial pressure.  Recommend stat CT brain with and without contrast [claustrophobia to MRI]  # chronic back pain/chronic left LE/Bil PN-history of cervical spine injury. unclear if any worsening from underlying metastatic disease.  Continue hydrocodone [for now as per Dr. Doy Hutching.   #Diabetes diet controlled-needs to be monitored on chemotherapy/steroids  # # Chemotherapy education; port placement. Pt WILL Need Antiemetics-Zofran and Compazine; EMLA cream. Order next.    Thank you Dr.Aleskerov for allowing me to participate in the care of your pleasant patient. Please do not hesitate to contact me with questions or concerns in the interim.  Discussed with [facility].  # DISPOSITION: # labs- cbc/cmp/CEA # CT with and without contrast- brain STAT # port referral # chemo education # follow up in 1 week- MD;no labs- Dr.B  Cc: Dr.A/Dr.Sparks

## 2021-04-24 NOTE — Progress Notes (Signed)
Met with patient and her daughter during initial visit with Dr. Rogue Bussing to discuss biopsy results and treatment options. All questions answered during visit. Reviewed upcoming appts. Contact info given and instructed to call with any questions or needs. Pt verbalized understanding.

## 2021-04-24 NOTE — Addendum Note (Signed)
Addended by: Telford Nab on: 04/24/2021 01:13 PM   Modules accepted: Orders

## 2021-04-24 NOTE — Research (Signed)
[id number] - Customer service manager for the Discovery and Validation of Biomarkers for the Prediction, Diagnosis, and Management of Disease  This Nurse has reviewed this patient's inclusion and exclusion criteria as a second review and confirms Olivia Buchanan is eligible for study participation.  Patient may continue with enrollment.  Wells Guiles '[place], RN, BSN Clinical Research Nurse I 04/24/21 2:40 PM

## 2021-04-24 NOTE — Research (Addendum)
Trial: [id number]  Patient Olivia Buchanan was identified by Jeral Fruit, RN as a potential candidate for the above listed study.  This Clinical Research Nurse met with Dariya Gainer, [id number], on 04/24/21 in a manner and location that ensures patient privacy to discuss participation in the above listed research study.  Patient is Accompanied by her daughter, husband via telephone .  A copy of the informed consent document and separate HIPAA Authorization was provided to the patient.  Patient reads, speaks, and understands [place].    Patient was provided with the business card of this Nurse and encouraged to contact the research team with any questions.  Patient was provided the option of taking informed consent documents home to review and was encouraged to review at their convenience with their support network, including other care providers. Patient is comfortable with making a decision regarding study participation today.  As outlined in the informed consent form, this Nurse and Cotina D Beam Jones discussed the purpose of the research study, the investigational nature of the study, study procedures and requirements for study participation, potential risks and benefits of study participation, as well as alternatives to participation. This study is not blinded. The patient understands participation is voluntary and they may withdraw from study participation at any time.  This study does not involve randomization.  This study does not involve an investigational drug or device. This study does not involve a placebo. Patient understands enrollment is pending full eligibility review.   Confidentiality and how the patient's information will be used as part of study participation were discussed.  Patient was informed there is reimbursement provided for their time and effort spent on trial participation.  The patient is encouraged to discuss research study participation with their insurance  provider to determine what costs they may incur as part of study participation, including research related injury.    All questions were answered to patient's satisfaction.  The informed consent and separate HIPAA Authorization was reviewed page by page.  The patient's mental and emotional status is appropriate to provide informed consent, and the patient verbalizes an understanding of study participation.  Patient has agreed to participate in the above listed research study and has voluntarily signed the informed consent version 1 dated 02/18/2021 and separate HIPAA Authorization, version 5 dated 12/16/ 2020 on 04/24/21 at 1215PM.  The patient was provided with a copy of the signed informed consent form and separate HIPAA Authorization for their reference.  No study specific procedures were obtained prior to the signing of the informed consent document.  Approximately 30 minutes were spent with the patient reviewing the informed consent documents.  After obtaining informed consent patient, voluntarily signed the optional Release of Information form for use throughout trial participation.  Eligibility for participation in the study was reviewed by Ruben Im, RN and this nurse for two person review. The patient was deemed eligible.   The patient was escorted to the lab for her blood draw and study labs by Mauricio Po, [place] and Hildred Alamin, Therapist, sports. Study blood was obtained after other ordered labs for the provider.  The patient was then provided with her $39 [employer] gift card and instructions for it's use by [employer], [place]. The patient signed the confirmation of receipt for her gift card.  The patient was thanked for her participation in clinical trials and encouraged to call the research office if she has any questions or concerns in the future.  Jeral Fruit, RN 04/24/21 12:58 PM

## 2021-04-25 LAB — CEA: CEA: 2.7 ng/mL (ref 0.0–4.7)

## 2021-04-27 ENCOUNTER — Telehealth: Payer: Self-pay | Admitting: *Deleted

## 2021-04-27 ENCOUNTER — Ambulatory Visit
Admission: RE | Admit: 2021-04-27 | Discharge: 2021-04-27 | Disposition: A | Discharge status: Home / Self Care | Payer: Medicare HMO | Source: Ambulatory Visit | Attending: Radiation Oncology | Admitting: Radiation Oncology

## 2021-04-27 ENCOUNTER — Encounter: Payer: Self-pay | Admitting: *Deleted

## 2021-04-27 ENCOUNTER — Other Ambulatory Visit: Payer: Self-pay

## 2021-04-27 ENCOUNTER — Inpatient Hospital Stay: Payer: Medicare HMO

## 2021-04-27 VITALS — BP 125/65 | HR 79 | Temp 98.0°F | Resp 20 | Wt 195.6 lb

## 2021-04-27 DIAGNOSIS — M5136 Other intervertebral disc degeneration, lumbar region: Secondary | ICD-10-CM | POA: Diagnosis not present

## 2021-04-27 DIAGNOSIS — Z79899 Other long term (current) drug therapy: Secondary | ICD-10-CM | POA: Diagnosis not present

## 2021-04-27 DIAGNOSIS — J449 Chronic obstructive pulmonary disease, unspecified: Secondary | ICD-10-CM | POA: Insufficient documentation

## 2021-04-27 DIAGNOSIS — R222 Localized swelling, mass and lump, trunk: Secondary | ICD-10-CM | POA: Insufficient documentation

## 2021-04-27 DIAGNOSIS — C3411 Malignant neoplasm of upper lobe, right bronchus or lung: Secondary | ICD-10-CM | POA: Insufficient documentation

## 2021-04-27 DIAGNOSIS — Z87891 Personal history of nicotine dependence: Secondary | ICD-10-CM | POA: Diagnosis not present

## 2021-04-27 DIAGNOSIS — I2510 Atherosclerotic heart disease of native coronary artery without angina pectoris: Secondary | ICD-10-CM | POA: Insufficient documentation

## 2021-04-27 DIAGNOSIS — I700 Atherosclerosis of aorta: Secondary | ICD-10-CM | POA: Insufficient documentation

## 2021-04-27 DIAGNOSIS — I10 Essential (primary) hypertension: Secondary | ICD-10-CM | POA: Insufficient documentation

## 2021-04-27 DIAGNOSIS — E785 Hyperlipidemia, unspecified: Secondary | ICD-10-CM | POA: Insufficient documentation

## 2021-04-27 DIAGNOSIS — R609 Edema, unspecified: Secondary | ICD-10-CM | POA: Insufficient documentation

## 2021-04-27 DIAGNOSIS — Z7982 Long term (current) use of aspirin: Secondary | ICD-10-CM | POA: Insufficient documentation

## 2021-04-27 DIAGNOSIS — R0602 Shortness of breath: Secondary | ICD-10-CM | POA: Diagnosis not present

## 2021-04-27 DIAGNOSIS — E119 Type 2 diabetes mellitus without complications: Secondary | ICD-10-CM | POA: Diagnosis not present

## 2021-04-27 DIAGNOSIS — K219 Gastro-esophageal reflux disease without esophagitis: Secondary | ICD-10-CM | POA: Diagnosis not present

## 2021-04-27 NOTE — Telephone Encounter (Signed)
Omnise[employer] form faxed to pathology for send out.

## 2021-04-27 NOTE — Progress Notes (Signed)
Met with patient during initial consult with Dr. Baruch Gouty. All questions answered during visit. Reviewed upcoming appts. Pt given resources regarding financial assistance through [employer]. Pt informed that if needs help with transportation to let me know to get her scheduled for pickup when treatments start. No further needed at this time. Instructed to call with any further questions or needs. Pt verbalized understanding.

## 2021-04-27 NOTE — Consult Note (Signed)
NEW PATIENT EVALUATION  Name: Olivia Buchanan  MRN: [id number]  Date:   04/27/2021     DOB: 1951-03-29   This 70 y.o. 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  REFERRING PHYSICIAN: Idelle Crouch, MD  CHIEF COMPLAINT:  Chief Complaint  Patient presents with   Lung Cancer    DIAGNOSIS: The encounter diagnosis was Malignant neoplasm of upper lobe of right lung ([place]).   PREVIOUS INVESTIGATIONS:  CT scans reviewed PET CT scan ordered Pathology report reviewed Clinical notes reviewed  HPI: Patient is a 70 year old female who presented with increasing shortness of breath cough and bilateral lower extremity edema.  Initial chest x-ray showed a right apical mass measuring 3.2 x 2.8 cm.  She had CT scan of the chest again demonstrating 3.2 cm mass consistent with primary lung cancer she also had enlarged right hilar and paratracheal lymph nodes measuring up to 1.6 cm suspicious for nodal metastatic disease.  She underwent bronchoscopy with pathology positive for non-small cell cancer favoring squamous cell carcinoma.  She was scheduled for PET CT scan later this week.  She been seen by medical oncology with recommendation if this is stage III disease for concurrent chemoradiation.  She is doing fairly well still has lower extremity edema.  No cough hemoptysis or chest tightness at this time.  PLANNED TREATMENT REGIMEN: Concurrent chemoradiation  PAST MEDICAL HISTORY:  has a past medical history of Angioedema (12/08/2019), Anxiety, Aortic atherosclerosis ([place]), CAD (coronary artery disease), COPD (chronic obstructive pulmonary disease) ([place]) (12/08/2019), DDD (degenerative disc disease), lumbar, Depression, Diabetes mellitus without complication ([place]), GERD (gastroesophageal reflux disease), HTN (hypertension) (12/08/2019), Hyperlipidemia, Hypertension,  Mass of upper lobe of right lung (04/02/2021), Obesity (BMI 30-39.9), and T2DM (type 2 diabetes mellitus) ([place]) (12/08/2019).    PAST SURGICAL HISTORY:  Past Surgical History:  Procedure Laterality Date   BACK SURGERY     lumbar L5-S1 ruptured disc x 3 surgeries   CERVICAL FUSION  2009   LAPAROSCOPIC TOTAL HYSTERECTOMY  1998   with oophorectomy   TONSILLECTOMY     VIDEO BRONCHOSCOPY WITH ENDOBRONCHIAL NAVIGATION N/A 04/15/2021   Procedure: VIDEO BRONCHOSCOPY WITH ENDOBRONCHIAL NAVIGATION;  Surgeon: Ottie Glazier, MD;  Location: [facility] ORS;  Service: Thoracic;  Laterality: N/A;   VIDEO BRONCHOSCOPY WITH ENDOBRONCHIAL ULTRASOUND N/A 04/15/2021   Procedure: VIDEO BRONCHOSCOPY WITH ENDOBRONCHIAL ULTRASOUND;  Surgeon: Ottie Glazier, MD;  Location: [facility] ORS;  Service: Thoracic;  Laterality: N/A;    FAMILY HISTORY: family history includes Bone cancer in her maternal uncle.  SOCIAL HISTORY:  reports that she quit smoking about 3 years ago. Her smoking use included cigarettes. She has never used smokeless tobacco. She reports previous alcohol use. She reports that she does not use drugs.  ALLERGIES: Ibuprofen and Macrobid [nitrofurantoin]  MEDICATIONS:  Current Outpatient Medications  Medication Sig Dispense Refill   albuterol (VENTOLIN HFA) 108 (90 Base) MCG/ACT inhaler Inhale 1 puff into the lungs every 6 (six) hours as needed for wheezing.     Ascorbic Acid (VITAMIN C) 1000 MG tablet Take 1,000 mg by mouth in the morning and at bedtime.     aspirin EC 81 MG tablet Take 81 mg by mouth at bedtime.     bisoprolol-hydrochlorothiazide (ZIAC) 5-6.25 MG tablet Take 1 tablet by mouth daily.     clonazePAM (KLONOPIN) 0.5 MG tablet Take 0.5 mg by mouth 3 (three)  times daily as needed for anxiety.     fluticasone-salmeterol (ADVAIR) 250-50 MCG/ACT AEPB Inhale 1 puff into the lungs in the morning and at bedtime.     gabapentin (NEURONTIN) 300 MG capsule Take 300 mg by mouth 3 (three) times daily.      hydrochlorothiazide (HYDRODIURIL) 25 MG tablet Take 25 mg by mouth daily.     HYDROcodone-acetaminophen (NORCO) 10-325 MG tablet Take 1 tablet by mouth 5 (five) times daily.     ipratropium-albuterol (DUONEB) 0.5-2.5 (3) MG/3ML SOLN Take 3 mLs by nebulization in the morning, at noon, in the evening, and at bedtime.     loratadine-pseudoephedrine (CLARITIN-D 24-HOUR) 10-240 MG 24 hr tablet Take 1 tablet by mouth daily.     Melatonin 10 MG TABS Take 10 mg by mouth at bedtime.     Multiple Vitamin (MULTI-VITAMIN) tablet Take 1 tablet by mouth daily.     nystatin cream (MYCOSTATIN) Apply 1 application topically 2 (two) times daily as needed for dry skin (around lips).     ondansetron (ZOFRAN) 8 MG tablet Take 1 tablet (8 mg total) by mouth every 8 (eight) hours as needed for nausea or vomiting. 20 tablet 1   pantoprazole (PROTONIX) 40 MG tablet Take 40 mg by mouth at bedtime.      potassium chloride SA (KLOR-CON) 20 MEQ tablet Take 20 mEq by mouth 2 (two) times daily.     pravastatin (PRAVACHOL) 80 MG tablet Take 80 mg by mouth at bedtime.      Probiotic Product (PROBIOTIC DAILY PO) Take 1 capsule by mouth daily.     tiZANidine (ZANAFLEX) 4 MG tablet Take 4 mg by mouth 3 (three) times daily.     venlafaxine XR (EFFEXOR-XR) 75 MG 24 hr capsule Take 150 mg by mouth at bedtime.     EPINEPHrine 0.3 mg/0.3 mL IJ SOAJ injection Inject 0.3 mLs (0.3 mg total) into the muscle as needed for anaphylaxis. (Patient not taking: No sig reported) 2 each 0   No current facility-administered medications for this encounter.    ECOG PERFORMANCE STATUS:  1 - Symptomatic but completely ambulatory  REVIEW OF SYSTEMS: Patient denies any weight loss, fatigue, weakness, fever, chills or night sweats. Patient denies any loss of vision, blurred vision. Patient denies any ringing  of the ears or hearing loss. No irregular heartbeat. Patient denies heart murmur or history of fainting. Patient denies any chest pain or pain  radiating to her upper extremities. Patient denies any shortness of breath, difficulty breathing at night, cough or hemoptysis. Patient denies any swelling in the lower legs. Patient denies any nausea vomiting, vomiting of blood, or coffee ground material in the vomitus. Patient denies any stomach pain. Patient states has had normal bowel movements no significant constipation or diarrhea. Patient denies any dysuria, hematuria or significant nocturia. Patient denies any problems walking, swelling in the joints or loss of balance. Patient denies any skin changes, loss of hair or loss of weight. Patient denies any excessive worrying or anxiety or significant depression. Patient denies any problems with insomnia. Patient denies excessive thirst, polyuria, polydipsia. Patient denies any swollen glands, patient denies easy bruising or easy bleeding. Patient denies any recent infections, allergies or URI. Patient "s visual fields have not changed significantly in recent time.   PHYSICAL EXAM: BP 125/65   Pulse 79   Temp 98 F (36.7 C) (Temporal)   Resp 20   Wt 195 lb 9.6 oz (88.7 kg)   BMI 39.51 kg/m  Patient does  have bilateral lower extremity edema.  Well-developed well-nourished patient in NAD. HEENT reveals PERLA, EOMI, discs not visualized.  Oral cavity is clear. No oral mucosal lesions are identified. Neck is clear without evidence of cervical or supraclavicular adenopathy. Lungs are clear to A&P. Cardiac examination is essentially unremarkable with regular rate and rhythm without murmur rub or thrill. Abdomen is benign with no organomegaly or masses noted. Motor sensory and DTR levels are equal and symmetric in the upper and lower extremities. Cranial nerves II through XII are grossly intact. Proprioception is intact. No peripheral adenopathy or edema is identified. No motor or sensory levels are noted. Crude visual fields are within normal range.  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  Will review PET/CT prior to treatment planning.  Would recommend 46 Gray over 70 weeks with concurrent chemotherapy to her right upper lobe lesion as well as her mediastinal nodes.  Risks and benefits of treatment occluding increased possible shortness of breath dysphagia alteration of blood counts skin reaction all were reviewed in detail with the patient and her husband.  They both seem to comprehend my treatment plan well.  I have set up treatment planning for the afternoon after her PET CT scan so I will have that to review prior to treatment planning.  There will be extra effort by both professional staff as well as technical staff to coordinate and manage concurrent chemoradiation and ensuing side effects during her treatments.  I have personally set up and ordered CT simulation as above.  I would like to take this opportunity to thank you for allowing me to participate in the care of your patient.Noreene Filbert, MD

## 2021-04-28 ENCOUNTER — Telehealth: Payer: Self-pay | Admitting: *Deleted

## 2021-04-28 ENCOUNTER — Ambulatory Visit
Admission: RE | Admit: 2021-04-28 | Discharge: 2021-04-28 | Disposition: A | Discharge status: Home / Self Care | Payer: Medicare HMO | Source: Ambulatory Visit | Attending: Internal Medicine | Admitting: Internal Medicine

## 2021-04-28 ENCOUNTER — Telehealth (INDEPENDENT_AMBULATORY_CARE_PROVIDER_SITE_OTHER): Payer: Self-pay

## 2021-04-28 ENCOUNTER — Other Ambulatory Visit: Payer: Self-pay

## 2021-04-28 ENCOUNTER — Inpatient Hospital Stay: Payer: Medicare HMO

## 2021-04-28 DIAGNOSIS — Z8673 Personal history of transient ischemic attack (TIA), and cerebral infarction without residual deficits: Secondary | ICD-10-CM | POA: Diagnosis not present

## 2021-04-28 DIAGNOSIS — R519 Headache, unspecified: Secondary | ICD-10-CM | POA: Diagnosis not present

## 2021-04-28 DIAGNOSIS — C3490 Malignant neoplasm of unspecified part of unspecified bronchus or lung: Secondary | ICD-10-CM | POA: Diagnosis not present

## 2021-04-28 HISTORY — DX: Unspecified asthma, uncomplicated: J45.909

## 2021-04-28 HISTORY — DX: Malignant (primary) neoplasm, unspecified: C80.1

## 2021-04-28 MED ORDER — IOHEXOL 350 MG/ML SOLN
75.0000 mL | Freq: Once | INTRAVENOUS | Status: AC | PRN
  Administered 2021-04-28: 75 mL via INTRAVENOUS

## 2021-04-28 NOTE — Telephone Encounter (Signed)
Patient returned my call and is scheduled with Dr. Lucky Cowboy for a port placement on 05/04/21 with a 10:45 am arrival time to the MM. Pre-procedure instructions were discussed and will be mailed.

## 2021-04-28 NOTE — Telephone Encounter (Signed)
Called report  CLINICAL DATA:  Non-small cell lung carcinoma. Staging. Left-sided headache.   EXAM: CT HEAD WITHOUT AND WITH CONTRAST   TECHNIQUE: Contiguous axial images were obtained from the base of the skull through the vertex without and with intravenous contrast   CONTRAST:  34mL OMNIPAQUE IOHEXOL 350 MG/ML SOLN   COMPARISON:  12/07/2019   FINDINGS: Brain: Mild age related volume loss. No focal abnormality seen affecting the brainstem or cerebellum. Cerebral hemispheres are normal except for an old lacunar infarction in the left basal ganglia. No sign of mass lesion, hemorrhage, hydrocephalus or extra-axial collection no abnormal contrast enhancement occurs.   Vascular: There is atherosclerotic calcification of the major vessels at the base of the brain.   Skull: Negative   Sinuses/Orbits: Clear/normal   Other: None   IMPRESSION: No evidence of metastatic disease. No acute finding. 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   These results will be called to the ordering clinician or representative by the Radiologist Assistant, and communication documented in the PACS or [employer].     Electronically Signed   By: Nelson Chimes M.D.   On: 04/28/2021 13:32

## 2021-04-28 NOTE — Telephone Encounter (Signed)
I attempted to contact the patient to schedule her for a port placement. A message was left for a return call.

## 2021-04-30 ENCOUNTER — Ambulatory Visit
Admission: RE | Admit: 2021-04-30 | Discharge: 2021-04-30 | Disposition: A | Discharge status: Home / Self Care | Payer: Medicare HMO | Source: Ambulatory Visit | Attending: Internal Medicine | Admitting: Internal Medicine

## 2021-04-30 ENCOUNTER — Ambulatory Visit
Admission: RE | Admit: 2021-04-30 | Discharge: 2021-04-30 | Disposition: A | Discharge status: Home / Self Care | Payer: Medicare HMO | Source: Ambulatory Visit | Attending: Radiation Oncology | Admitting: Radiation Oncology

## 2021-04-30 ENCOUNTER — Other Ambulatory Visit: Payer: Self-pay

## 2021-04-30 DIAGNOSIS — C3411 Malignant neoplasm of upper lobe, right bronchus or lung: Secondary | ICD-10-CM | POA: Diagnosis not present

## 2021-04-30 DIAGNOSIS — I714 Abdominal aortic aneurysm, without rupture: Secondary | ICD-10-CM | POA: Diagnosis not present

## 2021-04-30 DIAGNOSIS — C3490 Malignant neoplasm of unspecified part of unspecified bronchus or lung: Secondary | ICD-10-CM | POA: Diagnosis not present

## 2021-04-30 DIAGNOSIS — Z87891 Personal history of nicotine dependence: Secondary | ICD-10-CM | POA: Diagnosis not present

## 2021-04-30 LAB — GLUCOSE, CAPILLARY: Glucose-Capillary: 149 mg/dL — ABNORMAL HIGH (ref 70–99)

## 2021-04-30 MED ORDER — FLUDEOXYGLUCOSE F - 18 (FDG) INJECTION
10.1000 | Freq: Once | INTRAVENOUS | Status: AC | PRN
  Administered 2021-04-30: 10.52 via INTRAVENOUS

## 2021-05-01 ENCOUNTER — Inpatient Hospital Stay: Payer: Medicare HMO

## 2021-05-01 ENCOUNTER — Encounter: Payer: Self-pay | Admitting: *Deleted

## 2021-05-01 ENCOUNTER — Encounter: Payer: Self-pay | Admitting: Internal Medicine

## 2021-05-01 ENCOUNTER — Inpatient Hospital Stay (HOSPITAL_BASED_OUTPATIENT_CLINIC_OR_DEPARTMENT_OTHER): Payer: Medicare HMO | Admitting: Internal Medicine

## 2021-05-01 VITALS — BP 123/67 | HR 68 | Temp 98.8°F | Resp 16 | Ht 59.0 in | Wt 194.6 lb

## 2021-05-01 DIAGNOSIS — C3411 Malignant neoplasm of upper lobe, right bronchus or lung: Secondary | ICD-10-CM

## 2021-05-01 DIAGNOSIS — R202 Paresthesia of skin: Secondary | ICD-10-CM | POA: Diagnosis not present

## 2021-05-01 DIAGNOSIS — E119 Type 2 diabetes mellitus without complications: Secondary | ICD-10-CM | POA: Diagnosis not present

## 2021-05-01 DIAGNOSIS — Z79899 Other long term (current) drug therapy: Secondary | ICD-10-CM | POA: Diagnosis not present

## 2021-05-01 DIAGNOSIS — R2243 Localized swelling, mass and lump, lower limb, bilateral: Secondary | ICD-10-CM

## 2021-05-01 DIAGNOSIS — M7989 Other specified soft tissue disorders: Secondary | ICD-10-CM | POA: Diagnosis not present

## 2021-05-01 DIAGNOSIS — R0602 Shortness of breath: Secondary | ICD-10-CM | POA: Diagnosis not present

## 2021-05-01 DIAGNOSIS — G8929 Other chronic pain: Secondary | ICD-10-CM | POA: Diagnosis not present

## 2021-05-01 DIAGNOSIS — C3490 Malignant neoplasm of unspecified part of unspecified bronchus or lung: Secondary | ICD-10-CM

## 2021-05-01 DIAGNOSIS — I2510 Atherosclerotic heart disease of native coronary artery without angina pectoris: Secondary | ICD-10-CM | POA: Diagnosis not present

## 2021-05-01 DIAGNOSIS — R200 Anesthesia of skin: Secondary | ICD-10-CM | POA: Diagnosis not present

## 2021-05-01 LAB — BRAIN NATRIURETIC PEPTIDE: B Natriuretic Peptide: 24.8 pg/mL (ref 0.0–100.0)

## 2021-05-01 NOTE — Progress Notes (Signed)
Feeling very tired and has bilateral feet swelling.

## 2021-05-01 NOTE — Progress Notes (Signed)
Met with patient during follow up visit to discuss PET scan results with Dr. Rogue Bussing. All questions answered during visit. Reviewed upcoming appts. Instructed to call with any questions or needs. Pt verbalized understanding.

## 2021-05-01 NOTE — Assessment & Plan Note (Addendum)
#   T2 ? N2- [paratracheal lymph node based on CT scan; however PET scan no evidence of mediastinal uptake; biopsy negative for malignancy].    #I discussed with the patient the surprising results of the PET scan [absence of any metastatic adenopathy]; and also given negative results on the mediastinal lymph node biopsy-this could be downstage to potentially stage I-II.  Milligrams review at the tumor conference.  #Given significant downstaging-question option for surgery.  We will check PFT/2D echo [ See below].  If patient is not a surgical candidate-would recommend SBRT.  We will hold off port placement at this time.  #Leg swelling/currently resolved with diuretics question CHF-check 2D echo stat.  Will check BNP.  # chronic back pain/chronic left LE/Bil PN-history of cervical spine injury-no evidence of metastatic disease.  Continue hydrocodone [for now as per Dr. Doy Hutching.   #History of diabetes-diet controlled.  #Incidental asymptomatic left lacunar stroke noted-July 2022 CT scan-negative for any metastatic disease.  Reviewed imaging findings.  # DISPOSITION: # HOLD port placement # echo STAT- acute CHF # PFT ASAP # lab- BNP # follow up in 1 week; MD; no labs-- Dr.B  # I reviewed the blood work- with the patient in detail; also reviewed the imaging independently [as summarized above]; and with the patient in detail.  Discussed with Trinda Pascal.  Cc: Dr.A/Dr.Sparks

## 2021-05-01 NOTE — Progress Notes (Signed)
Phone call made to patient to inform of PET scan results as well as upcoming appt for ECHO on 8/1 at 11am. PFT's have previously been ordered by Dr. Lanney Gins and copy of those results have been received. Pt made aware that does not need repeat PFTs at this time. Pt verbalized understanding. Nothing further needed at this time.

## 2021-05-02 NOTE — Progress Notes (Signed)
[facility] NOTE  Patient Care Team: Idelle Crouch, MD as PCP - General (Internal Medicine) Telford Nab, RN as Oncology Nurse Navigator  CHIEF COMPLAINTS/PURPOSE OF CONSULTATION: lung cancer  #  Oncology History Overview Note  # RUL-squamous cell carcinoma [s/p bronchoscopy;Dr.A] 1. Spiculated mass of the posterior right pulmonary apex measuring 3.2 x 2.8 cm, consistent with primary lung malignancy. 2. Enlarged right hilar and pretracheal lymph nodes measuring up to 1.6 x 1.5 cm, suspicious for nodal metastatic disease. 3. Recommend multidisciplinary thoracic referral for consideration of PET-CT metabolic characterization and tissue sampling. 4. Background of very fine centrilobular nodularity throughout the lungs, most concentrated at the apices. Scattered bronchiolar plugging. Findings are most consistent with smoking-related respiratory bronchiolitis. 5. Coarse, nodular contour of the liver in the included upper abdomen, suggestive of cirrhosis. 6. Coronary artery disease.  # COPD-  # DM- diet controlled; chronic back pain- hydrocodone 5 day; chronic left lower extremity weakness.  CT brain July 2022-no evidence of metastatic disease; old lacunar stroke on the left side  # NGS/MOLECULAR TESTS:    # PALLIATIVE CARE EVALUATION:  # PAIN MANAGEMENT:    DIAGNOSIS:   STAGE:         ;  GOALS:  CURRENT/MOST RECENT THERAPY :     Malignant neoplasm of upper lobe of right lung ([place])  04/24/2021 Initial Diagnosis   Malignant neoplasm of upper lobe of right lung ([place])   05/02/2021 Cancer Staging   Staging form: Lung, AJCC 8th Edition - Clinical: Stage IB (cT2a, cN0, cM0) - Signed by Cammie Sickle, MD on 05/02/2021  Stage prefix: Initial diagnosis       HISTORY OF PRESENTING ILLNESS:  Emanie D Beam [name] 70 y.o.  female history of smoking [currently quit] [place] diagnosed squamous cell lung cancer is here to review results of her PET  scan/CT scan brain.  Patient continues to have shortness of breath on exertion.  No worsening cough or hemoptysis.  Continues to have intermittent swelling in the legs.  Denies any nausea vomiting abdominal pain.  Chronic tingling and numbness in extremities because of cervical neck surgeries/back surgeries   Review of Systems  Constitutional:  Positive for malaise/fatigue and weight loss. Negative for chills, diaphoresis and fever.  HENT:  Negative for nosebleeds and sore throat.   Eyes:  Negative for double vision.  Respiratory:  Positive for cough, sputum production and shortness of breath. Negative for hemoptysis and wheezing.   Cardiovascular:  Negative for chest pain, palpitations, orthopnea and leg swelling.  Gastrointestinal:  Positive for nausea. Negative for abdominal pain, blood in stool, constipation, diarrhea, heartburn, melena and vomiting.  Genitourinary:  Negative for dysuria, frequency and urgency.  Musculoskeletal:  Positive for back pain, joint pain and neck pain.  Skin: Negative.  Negative for itching and rash.  Neurological:  Positive for dizziness and headaches. Negative for tingling, focal weakness and weakness.  Endo/Heme/Allergies:  Does not bruise/bleed easily.  Psychiatric/Behavioral:  Negative for depression. The patient is not nervous/anxious and does not have insomnia.     MEDICAL HISTORY:  Past Medical History:  Diagnosis Date  . Angioedema 12/08/2019  . Anxiety   . Aortic atherosclerosis ([place])   . Asthma   . CAD (coronary artery disease)    3 vessel  . Cancer ([place])   . COPD (chronic obstructive pulmonary disease) ([place]) 12/08/2019  . DDD (degenerative disc disease), lumbar   . Depression   . Diabetes mellitus without complication ([place])   .  GERD (gastroesophageal reflux disease)   . HTN (hypertension) 12/08/2019  . Hyperlipidemia   . Hypertension   . Mass of upper lobe of right lung 04/02/2021   spiculated mass RIGHT posterior pulmonary apex  with associated hilar and pretracheal LAD; measured 3.2 x 2.8 cm  . Obesity (BMI 30-39.9)   . T2DM (type 2 diabetes mellitus) ([place]) 12/08/2019    SURGICAL HISTORY: Past Surgical History:  Procedure Laterality Date  . BACK SURGERY     lumbar L5-S1 ruptured disc x 3 surgeries  . CERVICAL FUSION  2009  . LAPAROSCOPIC TOTAL HYSTERECTOMY  1998   with oophorectomy  . TONSILLECTOMY    . VIDEO BRONCHOSCOPY WITH ENDOBRONCHIAL NAVIGATION N/A 04/15/2021   Procedure: VIDEO BRONCHOSCOPY WITH ENDOBRONCHIAL NAVIGATION;  Surgeon: Ottie Glazier, MD;  Location: [facility] ORS;  Service: Thoracic;  Laterality: N/A;  . VIDEO BRONCHOSCOPY WITH ENDOBRONCHIAL ULTRASOUND N/A 04/15/2021   Procedure: VIDEO BRONCHOSCOPY WITH ENDOBRONCHIAL ULTRASOUND;  Surgeon: Ottie Glazier, MD;  Location: [facility] ORS;  Service: Thoracic;  Laterality: N/A;    SOCIAL HISTORY: Social History   Socioeconomic History  . Marital status: Married    Spouse name: Orpah Greek  . Number of children: 3  . Years of education: Not on file  . Highest education level: Not on file  Occupational History  . Not on file  Tobacco Use  . Smoking status: Former    Years: 50.00    Types: Cigarettes    Quit date: 2019    Years since quitting: 3.5  . Smokeless tobacco: Never  Vaping Use  . Vaping Use: Never used  Substance and Sexual Activity  . Alcohol use: Not Currently  . Drug use: Never  . Sexual activity: Not on file  Other Topics Concern  . Not on file  Social History Narrative   Lives in home; with husband; lives in [place]; never smoking 2019; no alcohol. 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    Social Determinants of Health   Financial Resource Strain: Not on file  Food Insecurity: Not on file  Transportation Needs: Not on file  Physical Activity: Not on file  Stress: Not on file  Social Connections: Not on file  Intimate Partner Violence: Not on file    FAMILY HISTORY: Family History  Problem  Relation Age of Onset  . Bone cancer Maternal Uncle     ALLERGIES:  is allergic to ibuprofen and macrobid [nitrofurantoin].  MEDICATIONS:  Current Outpatient Medications  Medication Sig Dispense Refill  . albuterol (VENTOLIN HFA) 108 (90 Base) MCG/ACT inhaler Inhale 1 puff into the lungs every 6 (six) hours as needed for wheezing.    . Ascorbic Acid (VITAMIN C) 1000 MG tablet Take 1,000 mg by mouth in the morning and at bedtime.    Marland Kitchen aspirin EC 81 MG tablet Take 81 mg by mouth at bedtime.    . bisoprolol-hydrochlorothiazide (ZIAC) 5-6.25 MG tablet Take 1 tablet by mouth daily.    . clonazePAM (KLONOPIN) 0.5 MG tablet Take 0.5 mg by mouth 3 (three) times daily as needed for anxiety.    . fluticasone-salmeterol (ADVAIR) 250-50 MCG/ACT AEPB Inhale 1 puff into the lungs in the morning and at bedtime.    . gabapentin (NEURONTIN) 300 MG capsule Take 300 mg by mouth 3 (three) times daily.    . hydrochlorothiazide (HYDRODIURIL) 25 MG tablet Take 25 mg by mouth daily.    Marland Kitchen HYDROcodone-acetaminophen (NORCO) 10-325 MG tablet Take 1 tablet by mouth 5 (five) times daily.    Marland Kitchen  ipratropium-albuterol (DUONEB) 0.5-2.5 (3) MG/3ML SOLN Take 3 mLs by nebulization in the morning, at noon, in the evening, and at bedtime.    Marland Kitchen loratadine-pseudoephedrine (CLARITIN-D 24-HOUR) 10-240 MG 24 hr tablet Take 1 tablet by mouth daily.    . Melatonin 10 MG TABS Take 10 mg by mouth at bedtime.    . Multiple Vitamin (MULTI-VITAMIN) tablet Take 1 tablet by mouth daily.    Marland Kitchen nystatin cream (MYCOSTATIN) Apply 1 application topically 2 (two) times daily as needed for dry skin (around lips).    . ondansetron (ZOFRAN) 8 MG tablet Take 1 tablet (8 mg total) by mouth every 8 (eight) hours as needed for nausea or vomiting. 20 tablet 1  . pantoprazole (PROTONIX) 40 MG tablet Take 40 mg by mouth at bedtime.     . potassium chloride SA (KLOR-CON) 20 MEQ tablet Take 20 mEq by mouth 2 (two) times daily.    . pravastatin (PRAVACHOL) 80 MG  tablet Take 80 mg by mouth at bedtime.     . Probiotic Product (PROBIOTIC DAILY PO) Take 1 capsule by mouth daily.    Marland Kitchen tiZANidine (ZANAFLEX) 4 MG tablet Take 4 mg by mouth 3 (three) times daily.    Marland Kitchen venlafaxine XR (EFFEXOR-XR) 75 MG 24 hr capsule Take 150 mg by mouth at bedtime.    Marland Kitchen EPINEPHrine 0.3 mg/0.3 mL IJ SOAJ injection Inject 0.3 mLs (0.3 mg total) into the muscle as needed for anaphylaxis. (Patient not taking: No sig reported) 2 each 0   No current facility-administered medications for this visit.      Marland Kitchen  PHYSICAL EXAMINATION: ECOG PERFORMANCE STATUS: 1 - Symptomatic but completely ambulatory  Vitals:   05/01/21 1123  BP: 123/67  Pulse: 68  Resp: 16  Temp: 98.8 F (37.1 C)  SpO2: 98%   Filed Weights   05/01/21 1123  Weight: 194 lb 9.6 oz (88.3 kg)    Physical Exam Vitals and nursing note reviewed.  Constitutional:      Comments: Patient is accompanied by daughter.  Ambulating independently.  HENT:     Head: Normocephalic and atraumatic.     Mouth/Throat:     Mouth: Mucous membranes are moist.     Pharynx: Oropharynx is clear. No oropharyngeal exudate.  Eyes:     Extraocular Movements: Extraocular movements intact.     Pupils: Pupils are equal, round, and reactive to light.  Cardiovascular:     Rate and Rhythm: Normal rate and regular rhythm.  Pulmonary:     Effort: No respiratory distress.     Breath sounds: No wheezing.     Comments: Decreased breath sounds bilaterally at bases.  No wheeze or crackles Abdominal:     General: Bowel sounds are normal. There is no distension.     Palpations: Abdomen is soft. There is no mass.     Tenderness: There is no abdominal tenderness. There is no guarding or rebound.  Musculoskeletal:        General: No tenderness. Normal range of motion.     Cervical back: Normal range of motion and neck supple.  Skin:    General: Skin is warm.  Neurological:     General: No focal deficit present.     Mental Status: She is  alert and oriented to person, place, and time.     Comments: 4-5 strength in the left lower extremity/chronic.  Psychiatric:        Mood and Affect: Affect normal.  Behavior: Behavior normal.        Judgment: Judgment normal.     LABORATORY DATA:  I have reviewed the data as listed Lab Results  Component Value Date   WBC 9.3 04/24/2021   HGB 12.8 04/24/2021   HCT 39.1 04/24/2021   MCV 91.6 04/24/2021   PLT 271 04/24/2021   Recent Labs    04/14/21 0910 04/24/21 1224  NA 137 136  K 3.4* 3.5  CL 99 98  CO2 26 28  GLUCOSE 159* 132*  BUN 8 8  CREATININE 0.57 0.59  CALCIUM 8.7* 8.6*  GFRNONAA >60 >60  PROT  --  8.3*  ALBUMIN  --  3.7  AST  --  42*  ALT  --  19  ALKPHOS  --  56  BILITOT  --  0.5    RADIOGRAPHIC STUDIES: I have personally reviewed the radiological images as listed and agreed with the findings in the report. CT Head W Wo Contrast  Result Date: 04/28/2021 CLINICAL DATA:  Non-small cell lung carcinoma. Staging. Left-sided headache. EXAM: CT HEAD WITHOUT AND WITH CONTRAST TECHNIQUE: Contiguous axial images were obtained from the base of the skull through the vertex without and with intravenous contrast CONTRAST:  71mL OMNIPAQUE IOHEXOL 350 MG/ML SOLN COMPARISON:  12/07/2019 FINDINGS: Brain: Mild age related volume loss. No focal abnormality seen affecting the brainstem or cerebellum. Cerebral hemispheres are normal except for an old lacunar infarction in the left basal ganglia. No sign of mass lesion, hemorrhage, hydrocephalus or extra-axial collection no abnormal contrast enhancement occurs. Vascular: There is atherosclerotic calcification of the major vessels at the base of the brain. Skull: Negative Sinuses/Orbits: Clear/normal Other: None IMPRESSION: No evidence of metastatic disease. No acute finding. 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 These results will be called to the ordering clinician or representative by the Radiologist Assistant, and  communication documented in the PACS or [employer]. Electronically Signed   By: Nelson Chimes M.D.   On: 04/28/2021 13:32   CT CHEST W CONTRAST  Result Date: 04/02/2021 CLINICAL DATA:  Right apical pulmonary nodule, shortness of breath EXAM: CT CHEST WITH CONTRAST TECHNIQUE: Multidetector CT imaging of the chest was performed during intravenous contrast administration. CONTRAST:  45mL OMNIPAQUE IOHEXOL 300 MG/ML  SOLN COMPARISON:  None. FINDINGS: Cardiovascular: Aortic atherosclerosis. Normal heart size. Three-vessel coronary artery calcifications. No pericardial effusion. Mediastinum/Nodes: Enlarged right hilar and pretracheal lymph nodes measuring up to 1.6 x 1.5 cm (series 2, image 49). Thyroid gland, trachea, and esophagus demonstrate no significant findings. Lungs/Pleura: There is a spiculated mass of the posterior right pulmonary apex measuring 3.2 x 2.8 cm (series 3, image 26). Background of very fine centrilobular nodularity throughout the lungs, most concentrated at the apices. Scattered bronchiolar plugging. Bandlike scarring and or atelectasis of the bilateral lung bases. No pleural effusion or pneumothorax. Upper Abdomen: No acute abnormality. Coarse, nodular contour of the liver in the included upper abdomen. Musculoskeletal: No chest wall mass or suspicious bone lesions identified. IMPRESSION: 1. Spiculated mass of the posterior right pulmonary apex measuring 3.2 x 2.8 cm, consistent with primary lung malignancy. 2. Enlarged right hilar and pretracheal lymph nodes measuring up to 1.6 x 1.5 cm, suspicious for nodal metastatic disease. 3. Recommend multidisciplinary thoracic referral for consideration of PET-CT metabolic characterization and tissue sampling. 4. Background of very fine centrilobular nodularity throughout the lungs, most concentrated at the apices. Scattered bronchiolar plugging. Findings are most consistent with smoking-related respiratory bronchiolitis. 5. Coarse, nodular  contour of the liver  in the included upper abdomen, suggestive of cirrhosis. 6. Coronary artery disease. These results will be called to the ordering clinician or representative by the Radiologist Assistant, and communication documented in the PACS or [employer]. Aortic Atherosclerosis (ICD10-I70.0). Electronically Signed   By: Eddie Candle M.D.   On: 04/02/2021 15:43   NM PET Image Initial (PI) Skull Base To Thigh  Result Date: 05/01/2021 CLINICAL DATA:  Initial treatment strategy for non-small cell lung cancer. EXAM: NUCLEAR MEDICINE PET SKULL BASE TO THIGH TECHNIQUE: 10.52 mCi F-18 FDG was injected intravenously. Full-ring PET imaging was performed from the skull base to thigh after the radiotracer. CT data was obtained and used for attenuation correction and anatomic localization. Fasting blood glucose: 149 mg/dl COMPARISON:  Chest CT 04/15/2021 FINDINGS: Mediastinal blood pool activity: SUV max 2.43 Liver activity: SUV max NA NECK: No hypermetabolic lymph nodes in the neck. Incidental CT findings: none CHEST: The 3 cm right upper lobe lung mass is hypermetabolic with SUV max of 32.44 and consistent with known neoplasm. It abuts and likely involves the pleura. No obvious chest wall invasion. No findings suspicious for bone involvement. No hypermetabolic right hilar or mediastinal nodes to suggest metastatic adenopathy. The 12 mm pre carinal lymph node not show any hypermetabolism. SUV max is 2.45, similar to mediastinal background activity. No other hypermetabolic pulmonary lesions are identified. There are stable small ground-glass nodules in both lungs which will need close follow-up. No breast masses, supraclavicular or axillary adenopathy. Incidental CT findings: Stable atherosclerotic calcifications involving the aorta and coronary arteries. Stable borderline cardiac enlargement. ABDOMEN/PELVIS: No abnormal hypermetabolic activity within the liver, pancreas, adrenal glands, or spleen. No  hypermetabolic lymph nodes in the abdomen or pelvis. Incidental CT findings: Atherosclerotic calcifications involving the aorta and branch vessels. 3.2 cm abdominal aortic aneurysm just above the bifurcation. Recommend follow-up ultrasound every 3 years. This recommendation follows ACR consensus guidelines: White Paper of the ACR Incidental Findings Committee II on Vascular Findings. J Am Coll Radiol 2013; 10:789-794. SKELETON: No findings suspicious for osseous metastatic disease. Incidental CT findings: none IMPRESSION: 1. Hypermetabolic 3 cm right upper lobe lung mass consistent with known primary neoplasm. This lesion abuts the pleura and there is likely pleural involvement but no definite chest wall invasion. 2. No hypermetabolic mediastinal or hilar adenopathy and no findings for metastatic disease involving the neck, abdomen/pelvis or bony structures. 3. Persistent ground-glass nodules in both lungs which will require continued surveillance. 4. 3.2 cm abdominal aortic aneurysm as detailed above. Electronically Signed   By: Marijo Sanes M.D.   On: 05/01/2021 14:43   DG Chest Port 1 View  Result Date: 04/15/2021 CLINICAL DATA:  Status post bronchoscopy.  RIGHT upper lobe mass. EXAM: PORTABLE CHEST 1 VIEW COMPARISON:  CT 04/16/2019 FINDINGS: The RIGHT upper lobe density is increased compared to prior measuring 5.2 x 4.4 cm compared to 2.2 by 2.9 cm on CT. Findings suggest hematoma related to the bronchoscopy. No evidence of pneumothorax. No atelectasis. Normal cardiac silhouette. Mild venous congestion. Anterior cervical fusion IMPRESSION: 1. Expansion of the RIGHT upper lobe nodule/mass following procedure consistent with small volume post procedural hematoma. Recommend follow-up radiograph. 2. No pneumothorax.  Atelectasis. Electronically Signed   By: Suzy Bouchard M.D.   On: 04/15/2021 16:36   DG C-Arm 1-60 Min-No Report  Result Date: 04/15/2021 Fluoroscopy was utilized by the requesting  physician.  No radiographic interpretation.   CT Super D Chest Wo Contrast  Result Date: 04/15/2021 CLINICAL DATA:  Lung cancer, preoperative  planning for bronchoscopy. EXAM: CT CHEST WITHOUT CONTRAST TECHNIQUE: Multidetector CT imaging of the chest was performed using thin slice collimation for electromagnetic bronchoscopy planning purposes, without intravenous contrast. COMPARISON:  04/02/2021 FINDINGS: Cardiovascular: Coronary, aortic arch, and branch vessel atherosclerotic vascular disease. Mediastinum/Nodes: Lower paratracheal node anterior to the carina 1.4 cm in short axis on image 22 series 2, previously measured at 1.5 cm. Lungs/Pleura: 2.9 by 3.1 cm right upper lobe mass with adjacent atelectasis extending to the pleural surface. Several small scattered new ground-glass density lesions in the lungs, favoring the upper lobes, not present on 04/02/2021. Index peripheral ground-glass density lesion 1.3 by 1.0 cm on image 19 series 3. Although similar findings can be seen in the setting of COVID, a SARS coronavirus 2 test on 04/14/2021 was negative. Upper Abdomen: Nodularity of the liver contour suggests cirrhosis. Abdominal aortic atherosclerotic calcification. Musculoskeletal: Lower cervical plate and screw fixator. Mild thoracic spondylosis. IMPRESSION: 1. Scattered new (since 04/02/2021) small ground-glass density nodules in the lungs favoring the upper lobes. Appearance suggests atypical infectious process although a coronavirus test yesterday was negative. 2. Stable 3.1 cm right upper lobe mass. 3. Hepatic cirrhosis. 4.  Aortic Atherosclerosis (ICD10-I70.0).  Coronary atherosclerosis. Electronically Signed   By: Van Clines M.D.   On: 04/15/2021 12:00    ASSESSMENT & PLAN:   Malignant neoplasm of upper lobe of right lung ([place]) # T2 ? N2- [paratracheal lymph node based on CT scan; however PET scan no evidence of mediastinal uptake; biopsy negative for malignancy].    #I discussed with  the patient the surprising results of the PET scan [absence of any metastatic adenopathy]; and also given negative results on the mediastinal lymph node biopsy-this could be downstage to potentially stage I-II.  Milligrams review at the tumor conference.  #Given significant downstaging-question option for surgery.  We will check PFT/2D echo [ See below].  If patient is not a surgical candidate-would recommend SBRT.  We will hold off port placement at this time.  #Leg swelling/currently resolved with diuretics question CHF-check 2D echo stat.  Will check BNP.  # chronic back pain/chronic left LE/Bil PN-history of cervical spine injury-no evidence of metastatic disease.  Continue hydrocodone [for now as per Dr. Doy Hutching.   #History of diabetes-diet controlled.  #Incidental asymptomatic left lacunar stroke noted-July 2022 CT scan-negative for any metastatic disease.  Reviewed imaging findings.  # DISPOSITION: # HOLD port placement # echo STAT- acute CHF # PFT ASAP # lab- BNP # follow up in 1 week; MD; no labs-- Dr.B  # I reviewed the blood work- with the patient in detail; also reviewed the imaging independently [as summarized above]; and with the patient in detail.  Discussed with Trinda Pascal.  Cc: Dr.A/Dr.Sparks     All questions were answered. The patient knows to call the clinic with any problems, questions or concerns.       Cammie Sickle, MD 05/02/2021 11:52 AM

## 2021-05-04 ENCOUNTER — Ambulatory Visit
Admission: RE | Admit: 2021-05-04 | Discharge: 2021-05-04 | Disposition: A | Discharge status: Home / Self Care | Payer: Medicare HMO | Source: Ambulatory Visit | Attending: Internal Medicine | Admitting: Internal Medicine

## 2021-05-04 ENCOUNTER — Ambulatory Visit: Admission: RE | Admit: 2021-05-04 | Payer: Medicare HMO | Source: Home / Self Care | Admitting: Vascular Surgery

## 2021-05-04 ENCOUNTER — Other Ambulatory Visit: Payer: Self-pay

## 2021-05-04 ENCOUNTER — Encounter: Admission: RE | Payer: Self-pay | Source: Home / Self Care

## 2021-05-04 DIAGNOSIS — R2243 Localized swelling, mass and lump, lower limb, bilateral: Secondary | ICD-10-CM | POA: Diagnosis not present

## 2021-05-04 DIAGNOSIS — I10 Essential (primary) hypertension: Secondary | ICD-10-CM | POA: Insufficient documentation

## 2021-05-04 DIAGNOSIS — E119 Type 2 diabetes mellitus without complications: Secondary | ICD-10-CM | POA: Insufficient documentation

## 2021-05-04 DIAGNOSIS — R0602 Shortness of breath: Secondary | ICD-10-CM | POA: Insufficient documentation

## 2021-05-04 DIAGNOSIS — J449 Chronic obstructive pulmonary disease, unspecified: Secondary | ICD-10-CM | POA: Diagnosis not present

## 2021-05-04 DIAGNOSIS — E785 Hyperlipidemia, unspecified: Secondary | ICD-10-CM | POA: Insufficient documentation

## 2021-05-04 DIAGNOSIS — I2510 Atherosclerotic heart disease of native coronary artery without angina pectoris: Secondary | ICD-10-CM | POA: Diagnosis not present

## 2021-05-04 DIAGNOSIS — C3490 Malignant neoplasm of unspecified part of unspecified bronchus or lung: Secondary | ICD-10-CM

## 2021-05-04 LAB — ECHOCARDIOGRAM COMPLETE
AR max vel: 2.12 cm2
AV Area VTI: 2.04 cm2
AV Area mean vel: 2.27 cm2
AV Mean grad: 3 mmHg
AV Peak grad: 6.5 mmHg
Ao pk vel: 1.27 m/s
Area-P 1/2: 3.21 cm2
Calc EF: 67.1 %
MV VTI: 2.05 cm2
S' Lateral: 3.4 cm
Single Plane A2C EF: 65.1 %
Single Plane A4C EF: 68 %

## 2021-05-04 SURGERY — PORTA CATH INSERTION
Anesthesia: Moderate Sedation

## 2021-05-04 NOTE — Progress Notes (Signed)
*  PRELIMINARY RESULTS* Echocardiogram 2D Echocardiogram has been performed.  Olivia Buchanan 05/04/2021, 11:59 AM

## 2021-05-06 ENCOUNTER — Encounter: Payer: Self-pay | Admitting: Pulmonary Disease

## 2021-05-06 ENCOUNTER — Inpatient Hospital Stay: Payer: Medicare HMO | Attending: Internal Medicine | Admitting: *Deleted

## 2021-05-06 ENCOUNTER — Other Ambulatory Visit: Payer: Self-pay

## 2021-05-06 DIAGNOSIS — C3490 Malignant neoplasm of unspecified part of unspecified bronchus or lung: Secondary | ICD-10-CM

## 2021-05-06 LAB — PULMONARY FUNCTION TEST
FEV1/FVC: 81 %
FEV1: 1.86 L
FVC: 2.32 L

## 2021-05-06 NOTE — Progress Notes (Signed)
Multidisciplinary Oncology Council Documentation  [name] Olivia Buchanan was presented by our [facility] on 05/06/2021, which included representatives from:  Palliative Care Dietitian  Physical/Occupational Therapist Nurse Navigator Genetics Speech Therapist Social work Research RN  Viola currently presents with history of lung cancer.  We reviewed previous medical and familial history, history of present illness, and recent lab results along with all available histopathologic and imaging studies. The [place] considered available treatment options and made the following recommendations/referrals:  No recommendations at this time.  The MOC is a meeting of clinicians from various specialty areas who evaluate and discuss patients for whom a multidisciplinary approach is being considered. Final determinations in the plan of care are those of the provider(s).   Today's extended care, comprehensive team conference, [place] was not present for the discussion and was not examined.

## 2021-05-06 NOTE — Addendum Note (Signed)
Addended by: Telford Nab on: 05/06/2021 11:35 AM   Modules accepted: Orders

## 2021-05-08 ENCOUNTER — Inpatient Hospital Stay: Payer: Medicare HMO | Admitting: Internal Medicine

## 2021-05-11 ENCOUNTER — Ambulatory Visit: Payer: Medicare HMO

## 2021-05-11 ENCOUNTER — Ambulatory Visit: Payer: Medicare HMO | Admitting: Internal Medicine

## 2021-05-11 ENCOUNTER — Telehealth: Payer: Self-pay | Admitting: Pharmacy Technician

## 2021-05-11 NOTE — Telephone Encounter (Signed)
I let patient know we were haveing some issues with our gift cards. Requested once she activates if she could let the research department know if it was successful or if she needed replacment card.

## 2021-05-12 ENCOUNTER — Ambulatory Visit: Payer: Medicare HMO

## 2021-05-13 ENCOUNTER — Ambulatory Visit: Payer: Medicare HMO

## 2021-05-14 ENCOUNTER — Ambulatory Visit: Payer: Medicare HMO

## 2021-05-15 ENCOUNTER — Ambulatory Visit: Payer: Medicare HMO

## 2021-05-18 ENCOUNTER — Ambulatory Visit: Payer: Medicare HMO

## 2021-05-18 ENCOUNTER — Encounter: Payer: Self-pay | Admitting: Internal Medicine

## 2021-05-19 ENCOUNTER — Encounter: Payer: Self-pay | Admitting: Internal Medicine

## 2021-05-19 ENCOUNTER — Ambulatory Visit: Payer: Medicare HMO

## 2021-05-20 ENCOUNTER — Ambulatory Visit: Payer: Medicare HMO

## 2021-05-20 ENCOUNTER — Encounter: Payer: Self-pay | Admitting: Internal Medicine

## 2021-05-20 NOTE — Progress Notes (Signed)
[address]       Olivia [place]anan,[place] [address]             [phone]                    Olivia Buchanan Buchanan [facility] Medical Record #[id number] Date of Birth: 10/06/1950  Referring: Cammie Sickle, * Primary Care: Idelle Crouch, MD Primary Cardiologist: None  Chief Complaint:    Chief Complaint  Patient presents with   Lung Lesion    Surgical consult, Pet Scan 04/30/21, Chest CT 04/15/21, PFT's 05/06/21    History of Present Illness:    Olivia Buchanan [name] 70 y.o. female referred for surgical evaluation of a right upper lobe pulmonary mass.  It is biopsy-proven to be a non-small cell lung cancer.  She also has enlarged tracheal, and subcarinal lymph nodes as well as a groundglass opacity with a mixed density in the left upper lobe.  She was originally thought to be a locally advanced lung cancer referred for chemotherapy and radiation but this was all done before the PET/CT was performed.  The PET/CT does show avidity within the right upper lobe mass but no significant avidity within the pretracheal lymph nodes or the left upper lobe groundglass opacity.  She did undergo biopsy of the right upper lobe mass EBUS with lymph node biopsy.  The right upper lobe biopsy was positive, and the lymph node biopsies were negative.    She stopped smoking about 3 years ago.  She has significant anxiety.  She does endorse some headaches.  She was unable to undergo an MRI brain due to claustrophobia, but the CT head that was performed was negative for metastatic disease.       Zubrod Score: At the time of surgery this patient's most appropriate activity status/level should be described as: []     0    Normal activity, no symptoms [x]     1    Restricted in physical strenuous activity but ambulatory, able to do out light work []     2    Ambulatory and capable of self care, unable to do work activities, up and about               >50 % of waking hours                               []     3    Only limited self care, in bed greater than 50% of waking hours []     4    Completely disabled, no self care, confined to bed or chair []     5    Moribund   Past Medical History:  Diagnosis Date   Angioedema 12/08/2019   Anxiety    Aortic atherosclerosis (HCC)    Asthma    CAD (coronary artery disease)    3 vessel   Cancer ([place])    COPD (chronic obstructive pulmonary disease) ([place]) 12/08/2019   DDD (degenerative disc disease), lumbar    Depression    Diabetes mellitus without complication (HCC)    GERD (gastroesophageal reflux disease)    HTN (hypertension) 12/08/2019   Hyperlipidemia    Hypertension    Mass of upper lobe of right lung 04/02/2021   spiculated mass RIGHT posterior pulmonary apex with associated hilar and pretracheal LAD; measured 3.2 x 2.8 cm   Obesity (BMI 30-39.9)  T2DM (type 2 diabetes mellitus) ([place]) 12/08/2019    Past Surgical History:  Procedure Laterality Date   BACK SURGERY     lumbar L5-S1 ruptured disc x 3 surgeries   CERVICAL FUSION  2009   LAPAROSCOPIC TOTAL HYSTERECTOMY  1998   with oophorectomy   TONSILLECTOMY     VIDEO BRONCHOSCOPY WITH ENDOBRONCHIAL NAVIGATION N/A 04/15/2021   Procedure: VIDEO BRONCHOSCOPY WITH ENDOBRONCHIAL NAVIGATION;  Surgeon: Ottie Glazier, MD;  Location: [facility] ORS;  Service: Thoracic;  Laterality: N/A;   VIDEO BRONCHOSCOPY WITH ENDOBRONCHIAL ULTRASOUND N/A 04/15/2021   Procedure: VIDEO BRONCHOSCOPY WITH ENDOBRONCHIAL ULTRASOUND;  Surgeon: Ottie Glazier, MD;  Location: [facility] OR[facility];  Service: Thoracic;  Laterality: N/A;    Family History  Problem Relation Age of Onset   Bone cancer Maternal Uncle      Social History   Tobacco Use  Smoking Status Former   Years: 50.00   Types: Cigarettes   Quit date: 2019   Years since quitting: 3.6  Smokeless Tobacco Never    Social History   Substance and Sexual Activity  Alcohol Use Not Currently     Allergies  Allergen Reactions   Ibuprofen Hives    Macrobid [Nitrofurantoin] Rash    Current Outpatient Medications  Medication Sig Dispense Refill   albuterol (VENTOLIN HFA) 108 (90 Base) MCG/ACT inhaler Inhale 1 puff into the lungs every 6 (six) hours as needed for wheezing.     Ascorbic Acid (VITAMIN C) 1000 MG tablet Take 1,000 mg by mouth in the morning and at bedtime.     aspirin EC 81 MG tablet Take 81 mg by mouth at bedtime.     bisoprolol-hydrochlorothiazide (ZIAC) 5-6.25 MG tablet Take 1 tablet by mouth daily.     clonazePAM (KLONOPIN) 0.5 MG tablet Take 0.5 mg by mouth 3 (three) times daily as needed for anxiety.     EPINEPHrine 0.3 mg/0.3 mL IJ SOAJ injection Inject 0.3 mLs (0.3 mg total) into the muscle as needed for anaphylaxis. 2 each 0   fluticasone-salmeterol (ADVAIR) 250-50 MCG/ACT AEPB Inhale 1 puff into the lungs in the morning and at bedtime.     gabapentin (NEURONTIN) 300 MG capsule Take 300 mg by mouth 3 (three) times daily.     hydrochlorothiazide (HYDRODIURIL) 25 MG tablet Take 25 mg by mouth daily.     HYDROcodone-acetaminophen (NORCO) 10-325 MG tablet Take 1 tablet by mouth 5 (five) times daily.     ipratropium-albuterol (DUONEB) 0.5-2.5 (3) MG/3ML SOLN Take 3 mLs by nebulization in the morning, at noon, in the evening, and at bedtime.     loratadine-pseudoephedrine (CLARITIN-D 24-HOUR) 10-240 MG 24 hr tablet Take 1 tablet by mouth daily.     Melatonin 10 MG TABS Take 10 mg by mouth at bedtime.     Multiple Vitamin (MULTI-VITAMIN) tablet Take 1 tablet by mouth daily.     nystatin cream (MYCOSTATIN) Apply 1 application topically 2 (two) times daily as needed for dry skin (around lips).     ondansetron (ZOFRAN) 8 MG tablet Take 1 tablet (8 mg total) by mouth every 8 (eight) hours as needed for nausea or vomiting. 20 tablet 1   pantoprazole (PROTONIX) 40 MG tablet Take 40 mg by mouth at bedtime.      potassium chloride SA (KLOR-CON) 20 MEQ tablet Take 20 mEq by mouth 2 (two) times daily.     pravastatin (PRAVACHOL) 80  MG tablet Take 80 mg by mouth at bedtime.      Probiotic Product (PROBIOTIC DAILY  PO) Take 1 capsule by mouth daily.     tiZANidine (ZANAFLEX) 4 MG tablet Take 4 mg by mouth 3 (three) times daily.     venlafaxine XR (EFFEXOR-XR) 75 MG 24 hr capsule Take 150 mg by mouth at bedtime.     No current facility-administered medications for this visit.    Review of Systems  Constitutional:  Positive for malaise/fatigue.  Respiratory:  Positive for cough and shortness of breath.   Musculoskeletal:  Positive for myalgias.  Neurological:  Positive for weakness and headaches.  Psychiatric/Behavioral:  The patient is nervous/anxious.     PHYSICAL EXAMINATION: BP 133/74   Pulse 78   Resp 20   Ht 4\' 11"  (1.499 m)   Wt 194 lb (88 kg)   SpO2 92% Comment: RA  BMI 39.18 kg/m  Physical Exam Constitutional:      Appearance: Normal appearance. She is obese.  HENT:     Head: Normocephalic and atraumatic.  Eyes:     Extraocular Movements: Extraocular movements intact.  Cardiovascular:     Rate and Rhythm: Normal rate.  Pulmonary:     Effort: Pulmonary effort is normal. No respiratory distress.  Abdominal:     General: There is no distension.  Musculoskeletal:        General: Normal range of motion.     Cervical back: Normal range of motion.  Skin:    General: Skin is warm and dry.  Neurological:     General: No focal deficit present.     Mental Status: She is alert and oriented to person, place, and time.    Diagnostic Studies & Laboratory data:     Recent Radiology Findings:   CT Head W Wo Contrast  Result Date: 04/28/2021 CLINICAL DATA:  Non-small cell lung carcinoma. Staging. Left-sided headache. EXAM: CT HEAD WITHOUT AND WITH CONTRAST TECHNIQUE: Contiguous axial images were obtained from the base of the skull through the vertex without and with intravenous contrast CONTRAST:  34mL OMNIPAQUE IOHEXOL 350 MG/ML SOLN COMPARISON:  12/07/2019 FINDINGS: Brain: Mild age related volume loss.  No focal abnormality seen affecting the brainstem or cerebellum. Cerebral hemispheres are normal except for an old lacunar infarction in the left basal ganglia. No sign of mass lesion, hemorrhage, hydrocephalus or extra-axial collection no abnormal contrast enhancement occurs. Vascular: There is atherosclerotic calcification of the major vessels at the base of the brain. Skull: Negative Sinuses/Orbits: Clear/normal Other: None IMPRESSION: No evidence of metastatic disease. No acute finding. 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 These results will be called to the ordering clinician or representative by the Radiologist Assistant, and communication documented in the PACS or [employer]. Electronically Signed   By: Nelson Chimes M.D.   On: 04/28/2021 13:32   NM PET Image Initial (PI) Skull Base To Thigh  Result Date: 05/01/2021 CLINICAL DATA:  Initial treatment strategy for non-small cell lung cancer. EXAM: NUCLEAR MEDICINE PET SKULL BASE TO THIGH TECHNIQUE: 10.52 mCi F-18 FDG was injected intravenously. Full-ring PET imaging was performed from the skull base to thigh after the radiotracer. CT data was obtained and used for attenuation correction and anatomic localization. Fasting blood glucose: 149 mg/dl COMPARISON:  Chest CT 04/15/2021 FINDINGS: Mediastinal blood pool activity: SUV max 2.43 Liver activity: SUV max NA NECK: No hypermetabolic lymph nodes in the neck. Incidental CT findings: none CHEST: The 3 cm right upper lobe lung mass is hypermetabolic with SUV max of 32.99 and consistent with known neoplasm. It abuts and likely involves the pleura. No  obvious chest wall invasion. No findings suspicious for bone involvement. No hypermetabolic right hilar or mediastinal nodes to suggest metastatic adenopathy. The 12 mm pre carinal lymph node not show any hypermetabolism. SUV max is 2.45, similar to mediastinal background activity. No other hypermetabolic pulmonary lesions are identified. There are stable  small ground-glass nodules in both lungs which will need close follow-up. No breast masses, supraclavicular or axillary adenopathy. Incidental CT findings: Stable atherosclerotic calcifications involving the aorta and coronary arteries. Stable borderline cardiac enlargement. ABDOMEN/PELVIS: No abnormal hypermetabolic activity within the liver, pancreas, adrenal glands, or spleen. No hypermetabolic lymph nodes in the abdomen or pelvis. Incidental CT findings: Atherosclerotic calcifications involving the aorta and branch vessels. 3.2 cm abdominal aortic aneurysm just above the bifurcation. Recommend follow-up ultrasound every 3 years. This recommendation follows ACR consensus guidelines: White Paper of the ACR Incidental Findings Committee II on Vascular Findings. J Am Coll Radiol 2013; 10:789-794. SKELETON: No findings suspicious for osseous metastatic disease. Incidental CT findings: none IMPRESSION: 1. Hypermetabolic 3 cm right upper lobe lung mass consistent with known primary neoplasm. This lesion abuts the pleura and there is likely pleural involvement but no definite chest wall invasion. 2. No hypermetabolic mediastinal or hilar adenopathy and no findings for metastatic disease involving the neck, abdomen/pelvis or bony structures. 3. Persistent ground-glass nodules in both lungs which will require continued surveillance. 4. 3.2 cm abdominal aortic aneurysm as detailed above. Electronically Signed   By: Marijo Sanes M.D.   On: 05/01/2021 14:43   ECHOCARDIOGRAM COMPLETE  Result Date: 05/04/2021    ECHOCARDIOGRAM REPORT   Patient Name:   DYANN GOODSPEED Buchanan Buchanan Date of Exam: 05/04/2021 Medical Rec #:  [id number]          Height:       59.0 in Accession #:    [id number]         Weight:       194.6 lb Date of Birth:  04/20/1951           BSA:          1.823 m Patient Age:    70 years           BP:           123/67 mmHg Patient Gender: F                  HR:           75 bpm. Exam Location:  [facility] Procedure: 2D Echo,  Color Doppler, Cardiac Doppler and Strain Analysis Indications:     Shortness of breath on exertion R06.02; R22.43 Localized                  swelling of both lower legs  History:         Patient has no prior history of Echocardiogram examinations.                  CAD, COPD; Risk Factors:Diabetes, Dyslipidemia and                  Hypertension. Asthma.  Sonographer:     Charmayne Sheer RDCS (AE) Referring Phys:  [id number] Cammie Sickle Diagnosing Phys: Isaias Cowman MD  Sonographer Comments: Suboptimal parasternal window. Image acquisition challenging due to COPD. Global longitudinal strain was attempted. IMPRESSIONS  1. Left ventricular ejection fraction, by estimation, is 60 to 65%. The left ventricle has normal function. The left ventricle has no regional wall motion abnormalities. Left ventricular diastolic  parameters are consistent with Grade I diastolic dysfunction (impaired relaxation).  2. Right ventricular systolic function is normal. The right ventricular size is normal.  3. The mitral valve is normal in structure. Mild mitral valve regurgitation. No evidence of mitral stenosis.  4. The aortic valve is normal in structure. Aortic valve regurgitation is not visualized. No aortic stenosis is present.  5. The inferior vena cava is normal in size with greater than 50% respiratory variability, suggesting right atrial pressure of 3 mmHg. FINDINGS  Left Ventricle: Left ventricular ejection fraction, by estimation, is 60 to 65%. The left ventricle has normal function. The left ventricle has no regional wall motion abnormalities. The left ventricular internal cavity size was normal in size. There is  no left ventricular hypertrophy. Left ventricular diastolic parameters are consistent with Grade I diastolic dysfunction (impaired relaxation). Right Ventricle: The right ventricular size is normal. No increase in right ventricular wall thickness. Right ventricular systolic function is normal. Left Atrium: Left  atrial size was normal in size. Right Atrium: Right atrial size was normal in size. Pericardium: There is no evidence of pericardial effusion. Mitral Valve: The mitral valve is normal in structure. Mild mitral valve regurgitation. No evidence of mitral valve stenosis. MV peak gradient, 3.7 mmHg. The mean mitral valve gradient is 1.0 mmHg. Tricuspid Valve: The tricuspid valve is normal in structure. Tricuspid valve regurgitation is mild . No evidence of tricuspid stenosis. Aortic Valve: The aortic valve is normal in structure. Aortic valve regurgitation is not visualized. No aortic stenosis is present. Aortic valve mean gradient measures 3.0 mmHg. Aortic valve peak gradient measures 6.5 mmHg. Aortic valve area, by VTI measures 2.04 cm. Pulmonic Valve: The pulmonic valve was normal in structure. Pulmonic valve regurgitation is not visualized. No evidence of pulmonic stenosis. Aorta: The aortic root is normal in size and structure. Venous: The inferior vena cava is normal in size with greater than 50% respiratory variability, suggesting right atrial pressure of 3 mmHg. IAS/Shunts: No atrial level shunt detected by color flow Doppler.  LEFT VENTRICLE PLAX 2D LVIDd:         3.90 cm     Diastology LVIDs:         3.40 cm     LV e' medial:    5.77 cm/s LV PW:         0.90 cm     LV E/e' medial:  11.2 LV IVS:        1.00 cm     LV e' lateral:   7.07 cm/s LVOT diam:     2.00 cm     LV E/e' lateral: 9.2 LV SV:         56 LV SV Index:   31 LVOT Area:     3.14 cm  LV Volumes (MOD) LV vol d, MOD A2C: 39.0 ml LV vol d, MOD A4C: 65.0 ml LV vol s, MOD A2C: 13.6 ml LV vol s, MOD A4C: 20.8 ml LV SV MOD A2C:     25.4 ml LV SV MOD A4C:     65.0 ml LV SV MOD BP:      34.6 ml RIGHT VENTRICLE RV Basal diam:  2.70 cm LEFT ATRIUM           Index       RIGHT ATRIUM           Index LA diam:      3.50 cm 1.92 cm/m  RA Area:     11.10 cm LA  Vol [facility]): 22.0 ml 12.07 ml/m RA Volume:   23.90 ml  13.11 ml/m LA Vol (A4C): 36.4 ml 19.97 ml/m   AORTIC VALVE                   PULMONIC VALVE AV Area (Vmax):    2.12 cm    PV Vmax:       1.03 m/s AV Area (Vmean):   2.27 cm    PV Vmean:      79.200 cm/s AV Area (VTI):     2.04 cm    PV VTI:        0.270 m AV Vmax:           127.00 cm/s PV Peak grad:  4.2 mmHg AV Vmean:          81.500 cm/s PV Mean grad:  3.0 mmHg AV VTI:            0.275 m AV Peak Grad:      6.5 mmHg AV Mean Grad:      3.0 mmHg LVOT Vmax:         85.50 cm/s LVOT Vmean:        58.800 cm/s LVOT VTI:          0.179 m LVOT/AV VTI ratio: 0.65  AORTA Ao Root diam: 2.90 cm MITRAL VALVE               TRICUSPID VALVE MV Area (PHT): 3.21 cm    TR Peak grad:   28.7 mmHg MV Area VTI:   2.05 cm    TR Vmax:        268.00 cm/s MV Peak grad:  3.7 mmHg MV Mean grad:  1.0 mmHg    SHUNTS MV Vmax:       0.97 m/s    Systemic VTI:  0.18 m MV Vmean:      55.2 cm/s   Systemic Diam: 2.00 cm MV Decel Time: 236 msec MV E velocity: 64.70 cm/s MV A velocity: 77.10 cm/s MV E/A ratio:  0.84 Isaias Cowman MD Electronically signed by Isaias Cowman MD Signature Date/Time: 05/04/2021/1:48:55 PM    Final        I have independently reviewed the above radiology studies  and reviewed the findings with the patient.   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  Negative EBUS with FNA Bilateral groundglass opacities.  Assessment / Plan:   70 year old female with biopsy-proven non-small cell lung cancer of the right upper lobe.  She also has  enlarged subcarinal and pretracheal lymph nodes.  As well as bilateral groundglass opacities.  The lymph nodes do not appear avid on PET/CT but I am concerned that this may be metastatic disease.  Additionally she does have a concerning groundglass opacity in the left upper lobe.  I will refer this patient to Dr.  Icard for reevaluation and potential robotic bronchoscopy and EBUS.  I explained to her and her husband that it is key to properly delineate whether or not she has N2 disease, because if she does she would not be a surgical candidate.  There is also concern that she may have synchronous primaries in the left upper lobe versus metastatic disease.  I have ordered pulmonary function testing since a DLCO was not performed and the studies earlier this month.     I  spent 40 minutes with  the patient face to face in counseling and coordination of care.    Lajuana Matte 05/22/2021 9:28 AM

## 2021-05-21 ENCOUNTER — Ambulatory Visit: Payer: Medicare HMO

## 2021-05-22 ENCOUNTER — Institutional Professional Consult (permissible substitution): Payer: Medicare HMO | Admitting: Thoracic Surgery (Cardiothoracic Vascular Surgery)

## 2021-05-22 ENCOUNTER — Other Ambulatory Visit: Payer: Self-pay | Admitting: Thoracic Surgery (Cardiothoracic Vascular Surgery)

## 2021-05-22 ENCOUNTER — Other Ambulatory Visit: Payer: Self-pay

## 2021-05-22 ENCOUNTER — Ambulatory Visit: Payer: Medicare HMO

## 2021-05-22 VITALS — BP 133/74 | HR 78 | Resp 20 | Ht 59.0 in | Wt 194.0 lb

## 2021-05-22 DIAGNOSIS — R911 Solitary pulmonary nodule: Secondary | ICD-10-CM | POA: Diagnosis not present

## 2021-05-25 ENCOUNTER — Ambulatory Visit: Payer: Medicare HMO

## 2021-05-25 DIAGNOSIS — Z20822 Contact with and (suspected) exposure to covid-19: Secondary | ICD-10-CM | POA: Diagnosis not present

## 2021-05-26 ENCOUNTER — Ambulatory Visit: Payer: Medicare HMO

## 2021-05-27 ENCOUNTER — Ambulatory Visit: Payer: Medicare HMO

## 2021-05-28 ENCOUNTER — Other Ambulatory Visit: Payer: Self-pay

## 2021-05-28 ENCOUNTER — Ambulatory Visit: Payer: Medicare HMO

## 2021-05-28 ENCOUNTER — Encounter: Payer: Medicare HMO | Admitting: Thoracic Surgery (Cardiothoracic Vascular Surgery)

## 2021-05-28 ENCOUNTER — Ambulatory Visit (HOSPITAL_COMMUNITY)
Admission: RE | Admit: 2021-05-28 | Discharge: 2021-05-28 | Disposition: A | Discharge status: Home / Self Care | Payer: Medicare HMO | Source: Ambulatory Visit | Attending: Thoracic Surgery (Cardiothoracic Vascular Surgery) | Admitting: Thoracic Surgery (Cardiothoracic Vascular Surgery)

## 2021-05-28 DIAGNOSIS — R911 Solitary pulmonary nodule: Secondary | ICD-10-CM | POA: Diagnosis not present

## 2021-05-28 DIAGNOSIS — R062 Wheezing: Secondary | ICD-10-CM | POA: Diagnosis not present

## 2021-05-28 DIAGNOSIS — R0609 Other forms of dyspnea: Secondary | ICD-10-CM | POA: Diagnosis not present

## 2021-05-28 DIAGNOSIS — Z87891 Personal history of nicotine dependence: Secondary | ICD-10-CM | POA: Insufficient documentation

## 2021-05-28 LAB — PULMONARY FUNCTION TEST
DL/VA % pred: 105 %
DL/VA: 4.54 ml/min/mmHg/L
DLCO unc % pred: 98 %
DLCO unc: 16.25 ml/min/mmHg
FEF 25-75 Post: 1.67 L/sec
FEF 25-75 Pre: 1.47 L/sec
FEF2575-%Change-Post: 13 %
FEF2575-%Pred-Post: 97 %
FEF2575-%Pred-Pre: 85 %
FEV1-%Change-Post: 1 %
FEV1-%Pred-Post: 81 %
FEV1-%Pred-Pre: 79 %
FEV1-Post: 1.52 L
FEV1-Pre: 1.49 L
FEV1FVC-%Change-Post: 1 %
FEV1FVC-%Pred-Pre: 105 %
FEV6-%Change-Post: 0 %
FEV6-%Pred-Post: 78 %
FEV6-%Pred-Pre: 78 %
FEV6-Post: 1.86 L
FEV6-Pre: 1.85 L
FEV6FVC-%Pred-Post: 104 %
FEV6FVC-%Pred-Pre: 104 %
FVC-%Change-Post: 0 %
FVC-%Pred-Post: 75 %
FVC-%Pred-Pre: 74 %
FVC-Post: 1.86 L
FVC-Pre: 1.85 L
Post FEV1/FVC ratio: 82 %
Post FEV6/FVC ratio: 100 %
Pre FEV1/FVC ratio: 81 %
Pre FEV6/FVC Ratio: 100 %
RV % pred: 113 %
RV: 2.15 L
TLC % pred: 97 %
TLC: 4.18 L

## 2021-05-28 MED ORDER — ALBUTEROL SULFATE (2.5 MG/3ML) 0.083% IN NEBU
2.5000 mg | INHALATION_SOLUTION | Freq: Once | RESPIRATORY_TRACT | Status: AC
  Administered 2021-05-28: 2.5 mg via RESPIRATORY_TRACT

## 2021-05-29 ENCOUNTER — Ambulatory Visit: Payer: Medicare HMO

## 2021-06-01 ENCOUNTER — Ambulatory Visit: Payer: Medicare HMO

## 2021-06-02 ENCOUNTER — Ambulatory Visit: Payer: Medicare HMO

## 2021-06-03 ENCOUNTER — Ambulatory Visit: Payer: Medicare HMO

## 2021-06-04 ENCOUNTER — Ambulatory Visit: Payer: Medicare HMO

## 2021-06-04 ENCOUNTER — Telehealth: Payer: Self-pay

## 2021-06-04 NOTE — Telephone Encounter (Signed)
Patient contacted the office requesting PFT results. Per Dr. Kipp Brood, PFTs were great. Advised patient that she would have a referral appointment scheduled with Dr. Valeta Harms, Pulmonology soon. She acknowledged receipt.

## 2021-06-05 ENCOUNTER — Ambulatory Visit: Payer: Medicare HMO

## 2021-06-05 ENCOUNTER — Telehealth: Payer: Self-pay

## 2021-06-05 NOTE — Telephone Encounter (Signed)
-----   Message from Garner Nash, DO sent at 06/03/2021 12:50 PM EDT ----- Regarding: needs appt Please call patient and get scheduled with me on 9/7 at 10:45AM  Thanks Brad   ----- Message ----- From: Lajuana Matte, MD Sent: 05/22/2021   4:04 PM EDT To: Garner Nash, DO  Hey, I am sending you this lady.  She was worked up at [facility] and its complete should show.  They biopsy some lymph nodes which were all negative.  This is surprising given the size however they are not avid on PET/CT.  She also has his left upper lobe GGO that is concerning.  In any case let me know if you think she would be a candidate for robotic bronc and repeat EBUS.  Thanks,  H

## 2021-06-05 NOTE — Telephone Encounter (Signed)
Called patient but she did not answer. Left message for her to call us back on Tuesday.

## 2021-06-09 ENCOUNTER — Ambulatory Visit: Payer: Medicare HMO

## 2021-06-09 NOTE — Telephone Encounter (Signed)
Appt scheduled for 06/11/2021 at 2:30. No availability on 06/10/2021 and only one 25min slot on 06/11/2021.  Patient is aware of date/time of appt. Address and phone number provided.   Routing to BI as an [place].  Nothing further needed.

## 2021-06-10 ENCOUNTER — Ambulatory Visit: Payer: Medicare HMO

## 2021-06-11 ENCOUNTER — Telehealth: Payer: Self-pay | Admitting: Pulmonary Disease

## 2021-06-11 ENCOUNTER — Ambulatory Visit: Payer: Medicare HMO | Admitting: Pulmonary Disease

## 2021-06-11 ENCOUNTER — Encounter: Payer: Self-pay | Admitting: Pulmonary Disease

## 2021-06-11 ENCOUNTER — Ambulatory Visit: Payer: Medicare HMO

## 2021-06-11 ENCOUNTER — Other Ambulatory Visit: Payer: Self-pay

## 2021-06-11 VITALS — BP 138/70 | HR 87 | Temp 98.2°F | Ht 59.0 in | Wt 193.4 lb

## 2021-06-11 DIAGNOSIS — R918 Other nonspecific abnormal finding of lung field: Secondary | ICD-10-CM

## 2021-06-11 DIAGNOSIS — R599 Enlarged lymph nodes, unspecified: Secondary | ICD-10-CM

## 2021-06-11 DIAGNOSIS — C3411 Malignant neoplasm of upper lobe, right bronchus or lung: Secondary | ICD-10-CM | POA: Diagnosis not present

## 2021-06-11 NOTE — H&P (View-Only) (Signed)
Synopsis: Referred in September 2022 for lung mass, adenopathy by Idelle Crouch, MD  Subjective:   PATIENT ID: Olivia Buchanan: female DOB: 11/24/1950, MRN: [id number]  Chief Complaint  Patient presents with   Consult    Pt is here for a consult due to lung nodule seen. Pt states that she was diagnosed with lung cancer June 2022. States that she is SOB when she exerts herself. Denies any complaints of coughing.    This is a 70 year old female, Past medical history of coronary disease, COPD, hypertension, hyperlipidemia.  Patient was found to have a mass of the right upper lobe of the lung 3.2 cm x 2.8 cm.  Patient underwent navigational bronchoscopy tissue sampling and a new diagnosis of non-small cell lung cancer.  At that time had lymph node in the peritracheal region also sampled which was negative for malignancy.  Patient was referred to Dr. Kipp Brood and saw him on 05/22/2021.  Dr. Kipp Brood felt that there was concern for potential N2 disease.  Even though the EBUS was completed and negative.  Also has a left upper lobe lesion for possible synchronous primaries or metastasis.  Patient is overall very anxious about everything that has transpired over the past several weeks.   Past Medical History:  Diagnosis Date   Angioedema 12/08/2019   Anxiety    Aortic atherosclerosis (HCC)    Asthma    CAD (coronary artery disease)    3 vessel   Cancer (HCC)    COPD (chronic obstructive pulmonary disease) ([place]) 12/08/2019   DDD (degenerative disc disease), lumbar    Depression    Diabetes mellitus without complication (HCC)    GERD (gastroesophageal reflux disease)    HTN (hypertension) 12/08/2019   Hyperlipidemia    Hypertension    Mass of upper lobe of right lung 04/02/2021   spiculated mass RIGHT posterior pulmonary apex with associated hilar and pretracheal LAD; measured 3.2 x 2.8 cm   Obesity (BMI 30-39.9)    T2DM (type 2 diabetes mellitus) ([place]) 12/08/2019      Family History  Problem Relation Age of Onset   Bone cancer Maternal Uncle      Past Surgical History:  Procedure Laterality Date   BACK SURGERY     lumbar L5-S1 ruptured disc x 3 surgeries   CERVICAL FUSION  2009   LAPAROSCOPIC TOTAL HYSTERECTOMY  1998   with oophorectomy   TONSILLECTOMY     VIDEO BRONCHOSCOPY WITH ENDOBRONCHIAL NAVIGATION N/A 04/15/2021   Procedure: VIDEO BRONCHOSCOPY WITH ENDOBRONCHIAL NAVIGATION;  Surgeon: Ottie Glazier, MD;  Location: [facility] ORS;  Service: Thoracic;  Laterality: N/A;   VIDEO BRONCHOSCOPY WITH ENDOBRONCHIAL ULTRASOUND N/A 04/15/2021   Procedure: VIDEO BRONCHOSCOPY WITH ENDOBRONCHIAL ULTRASOUND;  Surgeon: Ottie Glazier, MD;  Location: [facility] OR[facility];  Service: Thoracic;  Laterality: N/A;    Social History   Socioeconomic History   Marital status: Married    Spouse name: Dwight   Number of children: 3   Years of education: Not on file   Highest education level: Not on file  Occupational History   Not on file  Tobacco Use   Smoking status: Former    Packs/day: 3.00    Years: 50.00    Pack years: 150.00    Types: Cigarettes    Quit date: 2019    Years since quitting: 3.6   Smokeless tobacco: Never  Vaping Use   Vaping Use: Never used  Substance and Sexual Activity   Alcohol use: Not Currently  Drug use: Never   Sexual activity: Not on file  Other Topics Concern   Not on file  Social History Narrative   Lives in home; with husband; lives in [place]; never smoking 2019; no alcohol. 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    Social Determinants of Health   Financial Resource Strain: Not on file  Food Insecurity: Not on file  Transportation Needs: Not on file  Physical Activity: Not on file  Stress: Not on file  Social Connections: Not on file  Intimate Partner Violence: Not on file     Allergies  Allergen Reactions   Ibuprofen Hives   Macrobid [Nitrofurantoin] Rash     Outpatient Medications Prior  to Visit  Medication Sig Dispense Refill   albuterol (VENTOLIN HFA) 108 (90 Base) MCG/ACT inhaler Inhale 1 puff into the lungs every 6 (six) hours as needed for wheezing.     Ascorbic Acid (VITAMIN C) 1000 MG tablet Take 1,000 mg by mouth in the morning and at bedtime.     aspirin EC 81 MG tablet Take 81 mg by mouth at bedtime.     bisoprolol-hydrochlorothiazide (ZIAC) 5-6.25 MG tablet Take 1 tablet by mouth daily.     clonazePAM (KLONOPIN) 0.5 MG tablet Take 0.5 mg by mouth 3 (three) times daily as needed for anxiety.     EPINEPHrine 0.3 mg/0.3 mL IJ SOAJ injection Inject 0.3 mLs (0.3 mg total) into the muscle as needed for anaphylaxis. 2 each 0   fluticasone-salmeterol (ADVAIR) 250-50 MCG/ACT AEPB Inhale 1 puff into the lungs in the morning and at bedtime.     gabapentin (NEURONTIN) 300 MG capsule Take 300 mg by mouth 3 (three) times daily.     hydrochlorothiazide (HYDRODIURIL) 25 MG tablet Take 25 mg by mouth daily.     HYDROcodone-acetaminophen (NORCO) 10-325 MG tablet Take 1 tablet by mouth 5 (five) times daily.     ipratropium-albuterol (DUONEB) 0.5-2.5 (3) MG/3ML SOLN Take 3 mLs by nebulization in the morning, at noon, in the evening, and at bedtime.     loratadine-pseudoephedrine (CLARITIN-D 24-HOUR) 10-240 MG 24 hr tablet Take 1 tablet by mouth daily.     Melatonin 10 MG TABS Take 10 mg by mouth at bedtime.     Multiple Vitamin (MULTI-VITAMIN) tablet Take 1 tablet by mouth daily.     nystatin cream (MYCOSTATIN) Apply 1 application topically 2 (two) times daily as needed for dry skin (around lips).     ondansetron (ZOFRAN) 8 MG tablet Take 1 tablet (8 mg total) by mouth every 8 (eight) hours as needed for nausea or vomiting. 20 tablet 1   pantoprazole (PROTONIX) 40 MG tablet Take 40 mg by mouth at bedtime.      potassium chloride SA (KLOR-CON) 20 MEQ tablet Take 20 mEq by mouth 2 (two) times daily.     pravastatin (PRAVACHOL) 80 MG tablet Take 80 mg by mouth at bedtime.      Probiotic  Product (PROBIOTIC DAILY PO) Take 1 capsule by mouth daily.     tiZANidine (ZANAFLEX) 4 MG tablet Take 4 mg by mouth 3 (three) times daily.     venlafaxine XR (EFFEXOR-XR) 75 MG 24 hr capsule Take 150 mg by mouth at bedtime.     No facility-administered medications prior to visit.    Review of Systems  Constitutional:  Negative for chills, fever, malaise/fatigue and weight loss.  HENT:  Negative for hearing loss, sore throat and tinnitus.   Eyes:  Negative for blurred  vision and double vision.  Respiratory:  Negative for cough, hemoptysis, sputum production, shortness of breath, wheezing and stridor.   Cardiovascular:  Negative for chest pain, palpitations, orthopnea, leg swelling and PND.  Gastrointestinal:  Negative for abdominal pain, constipation, diarrhea, heartburn, nausea and vomiting.  Genitourinary:  Negative for dysuria, hematuria and urgency.  Musculoskeletal:  Negative for joint pain and myalgias.  Skin:  Negative for itching and rash.  Neurological:  Negative for dizziness, tingling, weakness and headaches.  Endo/Heme/Allergies:  Negative for environmental allergies. Does not bruise/bleed easily.  Psychiatric/Behavioral:  Negative for depression. The patient is not nervous/anxious and does not have insomnia.   All other systems reviewed and are negative.   Objective:  Physical Exam Vitals reviewed.  Constitutional:      General: She is not in acute distress.    Appearance: She is well-developed.  HENT:     Head: Normocephalic and atraumatic.  Eyes:     General: No scleral icterus.    Conjunctiva/sclera: Conjunctivae normal.     Pupils: Pupils are equal, round, and reactive to light.  Neck:     Vascular: No JVD.     Trachea: No tracheal deviation.  Cardiovascular:     Rate and Rhythm: Normal rate and regular rhythm.     Heart sounds: Normal heart sounds. No murmur heard. Pulmonary:     Effort: Pulmonary effort is normal. No tachypnea, accessory muscle usage or  respiratory distress.     Breath sounds: No stridor. No wheezing, rhonchi or rales.  Abdominal:     General: Bowel sounds are normal. There is no distension.     Palpations: Abdomen is soft.     Tenderness: There is no abdominal tenderness.  Musculoskeletal:        General: No tenderness.     Cervical back: Neck supple.  Lymphadenopathy:     Cervical: No cervical adenopathy.  Skin:    General: Skin is warm and dry.     Capillary Refill: Capillary refill takes less than 2 seconds.     Findings: No rash.  Neurological:     Mental Status: She is alert and oriented to person, place, and time.  Psychiatric:        Behavior: Behavior normal.     Vitals:   06/11/21 1446  BP: 138/70  Pulse: 87  Temp: 98.2 F (36.8 C)  TempSrc: Oral  SpO2: 97%  Weight: 193 lb 6.4 oz (87.7 kg)  Height: 4\' 11"  (1.499 m)   97% on  RA BMI Readings from Last 3 Encounters:  06/11/21 39.06 kg/m  05/22/21 39.18 kg/m  05/01/21 39.30 kg/m   Wt Readings from Last 3 Encounters:  06/11/21 193 lb 6.4 oz (87.7 kg)  05/22/21 194 lb (88 kg)  05/01/21 194 lb 9.6 oz (88.3 kg)     CBC    Component Value Date/Time   WBC 9.3 04/24/2021 1224   RBC 4.27 04/24/2021 1224   HGB 12.8 04/24/2021 1224   HCT 39.1 04/24/2021 1224   PLT 271 04/24/2021 1224   MCV 91.6 04/24/2021 1224   MCH 30.0 04/24/2021 1224   MCHC 32.7 04/24/2021 1224   RDW 12.7 04/24/2021 1224   LYMPHSABS 2.6 04/24/2021 1224   MONOABS 0.8 04/24/2021 1224   EOSABS 0.3 04/24/2021 1224   BASOSABS 0.1 04/24/2021 1224    Chest Imaging: 04/30/2021 nuclear medicine PET scan: PET avid upper lobe lesion on the right consistent with malignancy.  Has a enlarged lymph node within the mediastinum.  Has no significant PET avidity. The patient's images have been independently reviewed by me.    Pulmonary Functions Testing Results: PFT Results Latest Ref Rng & Units 05/28/2021  FVC-Pre L 1.85  FVC-Predicted Pre % 74  FVC-Post L 1.86   FVC-Predicted Post % 75  Pre FEV1/FVC % % 81  Post FEV1/FCV % % 82  FEV1-Pre L 1.49  FEV1-Predicted Pre % 79  FEV1-Post L 1.52  DLCO uncorrected ml/min/mmHg 16.25  DLCO UNC% % 98  DLVA Predicted % 105  TLC L 4.18  TLC % Predicted % 97  RV % Predicted % 113    FeNO:   Pathology:   Echocardiogram:   Heart Catheterization:     Assessment & Plan:     ICD-10-CM   1. Lung nodules  R91.8 Procedural/ Surgical Case Request: VIDEO BRONCHOSCOPY WITH ENDOBRONCHIAL NAVIGATION, VIDEO BRONCHOSCOPY WITH ENDOBRONCHIAL ULTRASOUND    Ambulatory referral to Pulmonology    CT Super D Chest Wo Contrast    2. Adenopathy  R59.9 Procedural/ Surgical Case Request: VIDEO BRONCHOSCOPY WITH ENDOBRONCHIAL NAVIGATION, VIDEO BRONCHOSCOPY WITH ENDOBRONCHIAL ULTRASOUND    Ambulatory referral to Pulmonology    CT Super D Chest Wo Contrast    3. Malignant neoplasm of upper lobe of right lung (HCC)  C34.11       Discussion: This is a 70 year old female with a new diagnosis of a right upper lobe lung cancer.  Has underwent bronchoscopy already as well as endobronchial ultrasound with biopsy of station 4R.  Has been seen by thoracic surgery.  Thoracic surgery is still concerned that the 4R node due to its size is potentially malignant.  And they would like this resampled before consideration for surgery.  Therefore patient was referred to me for repeat biopsy.  Plan: I have discussed case with Dr. Kipp Brood. He is also concerned about the contralateral nodules within the chest. We will plan for a combined procedure again to go after the small nodules within the left chest as well as the node within the mediastinum. I believe if these are negative then they are going to consider moving forward with surgery. We will get her scheduled as soon as possible. I called endoscopy with an attempt to try to get her scheduled for next week.  There is no additional available dates due to the limited amount of endoscopy  staffing at the moment. Our next available date to schedule bronchoscopy is going to be on 06/23/2021.    Current Outpatient Medications:    albuterol (VENTOLIN HFA) 108 (90 Base) MCG/ACT inhaler, Inhale 1 puff into the lungs every 6 (six) hours as needed for wheezing., Disp: , Rfl:    Ascorbic Acid (VITAMIN C) 1000 MG tablet, Take 1,000 mg by mouth in the morning and at bedtime., Disp: , Rfl:    aspirin EC 81 MG tablet, Take 81 mg by mouth at bedtime., Disp: , Rfl:    bisoprolol-hydrochlorothiazide (ZIAC) 5-6.25 MG tablet, Take 1 tablet by mouth daily., Disp: , Rfl:    clonazePAM (KLONOPIN) 0.5 MG tablet, Take 0.5 mg by mouth 3 (three) times daily as needed for anxiety., Disp: , Rfl:    EPINEPHrine 0.3 mg/0.3 mL IJ SOAJ injection, Inject 0.3 mLs (0.3 mg total) into the muscle as needed for anaphylaxis., Disp: 2 each, Rfl: 0   fluticasone-salmeterol (ADVAIR) 250-50 MCG/ACT AEPB, Inhale 1 puff into the lungs in the morning and at bedtime., Disp: , Rfl:    gabapentin (NEURONTIN) 300 MG capsule, Take 300 mg by mouth  3 (three) times daily., Disp: , Rfl:    hydrochlorothiazide (HYDRODIURIL) 25 MG tablet, Take 25 mg by mouth daily., Disp: , Rfl:    HYDROcodone-acetaminophen (NORCO) 10-325 MG tablet, Take 1 tablet by mouth 5 (five) times daily., Disp: , Rfl:    ipratropium-albuterol (DUONEB) 0.5-2.5 (3) MG/3ML SOLN, Take 3 mLs by nebulization in the morning, at noon, in the evening, and at bedtime., Disp: , Rfl:    loratadine-pseudoephedrine (CLARITIN-D 24-HOUR) 10-240 MG 24 hr tablet, Take 1 tablet by mouth daily., Disp: , Rfl:    Melatonin 10 MG TABS, Take 10 mg by mouth at bedtime., Disp: , Rfl:    Multiple Vitamin (MULTI-VITAMIN) tablet, Take 1 tablet by mouth daily., Disp: , Rfl:    nystatin cream (MYCOSTATIN), Apply 1 application topically 2 (two) times daily as needed for dry skin (around lips)., Disp: , Rfl:    ondansetron (ZOFRAN) 8 MG tablet, Take 1 tablet (8 mg total) by mouth every 8 (eight)  hours as needed for nausea or vomiting., Disp: 20 tablet, Rfl: 1   pantoprazole (PROTONIX) 40 MG tablet, Take 40 mg by mouth at bedtime. , Disp: , Rfl:    potassium chloride SA (KLOR-CON) 20 MEQ tablet, Take 20 mEq by mouth 2 (two) times daily., Disp: , Rfl:    pravastatin (PRAVACHOL) 80 MG tablet, Take 80 mg by mouth at bedtime. , Disp: , Rfl:    Probiotic Product (PROBIOTIC DAILY PO), Take 1 capsule by mouth daily., Disp: , Rfl:    tiZANidine (ZANAFLEX) 4 MG tablet, Take 4 mg by mouth 3 (three) times daily., Disp: , Rfl:    venlafaxine XR (EFFEXOR-XR) 75 MG 24 hr capsule, Take 150 mg by mouth at bedtime., Disp: , Rfl:   I spent 63 minutes dedicated to the care of this patient on the date of this encounter to include pre-visit review of records, face-to-face time with the patient discussing conditions above, post visit ordering of testing, clinical documentation with the electronic health record, making appropriate referrals as documented, and communicating necessary findings to members of the patients care team.   Garner Nash, DO [facility] 06/11/2021 3:00 PM

## 2021-06-11 NOTE — Progress Notes (Signed)
Synopsis: Referred in September 2022 for lung mass, adenopathy by Idelle Crouch, MD  Subjective:   PATIENT ID: Jeanella Cara: female DOB: 1950/11/30, MRN: [id number]  Chief Complaint  Patient presents with   Consult    Pt is here for a consult due to lung nodule seen. Pt states that she was diagnosed with lung cancer June 2022. States that she is SOB when she exerts herself. Denies any complaints of coughing.    This is a 70 year old female, Past medical history of coronary disease, COPD, hypertension, hyperlipidemia.  Patient was found to have a mass of the right upper lobe of the lung 3.2 cm x 2.8 cm.  Patient underwent navigational bronchoscopy tissue sampling and a new diagnosis of non-small cell lung cancer.  At that time had lymph node in the peritracheal region also sampled which was negative for malignancy.  Patient was referred to Dr. Kipp Brood and saw him on 05/22/2021.  Dr. Kipp Brood felt that there was concern for potential N2 disease.  Even though the EBUS was completed and negative.  Also has a left upper lobe lesion for possible synchronous primaries or metastasis.  Patient is overall very anxious about everything that has transpired over the past several weeks.   Past Medical History:  Diagnosis Date   Angioedema 12/08/2019   Anxiety    Aortic atherosclerosis (HCC)    Asthma    CAD (coronary artery disease)    3 vessel   Cancer (HCC)    COPD (chronic obstructive pulmonary disease) ([place]) 12/08/2019   DDD (degenerative disc disease), lumbar    Depression    Diabetes mellitus without complication (HCC)    GERD (gastroesophageal reflux disease)    HTN (hypertension) 12/08/2019   Hyperlipidemia    Hypertension    Mass of upper lobe of right lung 04/02/2021   spiculated mass RIGHT posterior pulmonary apex with associated hilar and pretracheal LAD; measured 3.2 x 2.8 cm   Obesity (BMI 30-39.9)    T2DM (type 2 diabetes mellitus) ([place]) 12/08/2019      Family History  Problem Relation Age of Onset   Bone cancer Maternal Uncle      Past Surgical History:  Procedure Laterality Date   BACK SURGERY     lumbar L5-S1 ruptured disc x 3 surgeries   CERVICAL FUSION  2009   LAPAROSCOPIC TOTAL HYSTERECTOMY  1998   with oophorectomy   TONSILLECTOMY     VIDEO BRONCHOSCOPY WITH ENDOBRONCHIAL NAVIGATION N/A 04/15/2021   Procedure: VIDEO BRONCHOSCOPY WITH ENDOBRONCHIAL NAVIGATION;  Surgeon: Ottie Glazier, MD;  Location: [facility] ORS;  Service: Thoracic;  Laterality: N/A;   VIDEO BRONCHOSCOPY WITH ENDOBRONCHIAL ULTRASOUND N/A 04/15/2021   Procedure: VIDEO BRONCHOSCOPY WITH ENDOBRONCHIAL ULTRASOUND;  Surgeon: Ottie Glazier, MD;  Location: [facility] ORS;  Service: Thoracic;  Laterality: N/A;    Social History   Socioeconomic History   Marital status: Married    Spouse name: Dwight   Number of children: 3   Years of education: Not on file   Highest education level: Not on file  Occupational History   Not on file  Tobacco Use   Smoking status: Former    Packs/day: 3.00    Years: 50.00    Pack years: 150.00    Types: Cigarettes    Quit date: 2019    Years since quitting: 3.6   Smokeless tobacco: Never  Vaping Use   Vaping Use: Never used  Substance and Sexual Activity   Alcohol use: Not Currently  Drug use: Never   Sexual activity: Not on file  Other Topics Concern   Not on file  Social History Narrative   Lives in home; with husband; lives in [place]; never smoking 2019; no alcohol. 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    Social Determinants of Health   Financial Resource Strain: Not on file  Food Insecurity: Not on file  Transportation Needs: Not on file  Physical Activity: Not on file  Stress: Not on file  Social Connections: Not on file  Intimate Partner Violence: Not on file     Allergies  Allergen Reactions   Ibuprofen Hives   Macrobid [Nitrofurantoin] Rash     Outpatient Medications Prior  to Visit  Medication Sig Dispense Refill   albuterol (VENTOLIN HFA) 108 (90 Base) MCG/ACT inhaler Inhale 1 puff into the lungs every 6 (six) hours as needed for wheezing.     Ascorbic Acid (VITAMIN C) 1000 MG tablet Take 1,000 mg by mouth in the morning and at bedtime.     aspirin EC 81 MG tablet Take 81 mg by mouth at bedtime.     bisoprolol-hydrochlorothiazide (ZIAC) 5-6.25 MG tablet Take 1 tablet by mouth daily.     clonazePAM (KLONOPIN) 0.5 MG tablet Take 0.5 mg by mouth 3 (three) times daily as needed for anxiety.     EPINEPHrine 0.3 mg/0.3 mL IJ SOAJ injection Inject 0.3 mLs (0.3 mg total) into the muscle as needed for anaphylaxis. 2 each 0   fluticasone-salmeterol (ADVAIR) 250-50 MCG/ACT AEPB Inhale 1 puff into the lungs in the morning and at bedtime.     gabapentin (NEURONTIN) 300 MG capsule Take 300 mg by mouth 3 (three) times daily.     hydrochlorothiazide (HYDRODIURIL) 25 MG tablet Take 25 mg by mouth daily.     HYDROcodone-acetaminophen (NORCO) 10-325 MG tablet Take 1 tablet by mouth 5 (five) times daily.     ipratropium-albuterol (DUONEB) 0.5-2.5 (3) MG/3ML SOLN Take 3 mLs by nebulization in the morning, at noon, in the evening, and at bedtime.     loratadine-pseudoephedrine (CLARITIN-D 24-HOUR) 10-240 MG 24 hr tablet Take 1 tablet by mouth daily.     Melatonin 10 MG TABS Take 10 mg by mouth at bedtime.     Multiple Vitamin (MULTI-VITAMIN) tablet Take 1 tablet by mouth daily.     nystatin cream (MYCOSTATIN) Apply 1 application topically 2 (two) times daily as needed for dry skin (around lips).     ondansetron (ZOFRAN) 8 MG tablet Take 1 tablet (8 mg total) by mouth every 8 (eight) hours as needed for nausea or vomiting. 20 tablet 1   pantoprazole (PROTONIX) 40 MG tablet Take 40 mg by mouth at bedtime.      potassium chloride SA (KLOR-CON) 20 MEQ tablet Take 20 mEq by mouth 2 (two) times daily.     pravastatin (PRAVACHOL) 80 MG tablet Take 80 mg by mouth at bedtime.      Probiotic  Product (PROBIOTIC DAILY PO) Take 1 capsule by mouth daily.     tiZANidine (ZANAFLEX) 4 MG tablet Take 4 mg by mouth 3 (three) times daily.     venlafaxine XR (EFFEXOR-XR) 75 MG 24 hr capsule Take 150 mg by mouth at bedtime.     No facility-administered medications prior to visit.    Review of Systems  Constitutional:  Negative for chills, fever, malaise/fatigue and weight loss.  HENT:  Negative for hearing loss, sore throat and tinnitus.   Eyes:  Negative for blurred  vision and double vision.  Respiratory:  Negative for cough, hemoptysis, sputum production, shortness of breath, wheezing and stridor.   Cardiovascular:  Negative for chest pain, palpitations, orthopnea, leg swelling and PND.  Gastrointestinal:  Negative for abdominal pain, constipation, diarrhea, heartburn, nausea and vomiting.  Genitourinary:  Negative for dysuria, hematuria and urgency.  Musculoskeletal:  Negative for joint pain and myalgias.  Skin:  Negative for itching and rash.  Neurological:  Negative for dizziness, tingling, weakness and headaches.  Endo/Heme/Allergies:  Negative for environmental allergies. Does not bruise/bleed easily.  Psychiatric/Behavioral:  Negative for depression. The patient is not nervous/anxious and does not have insomnia.   All other systems reviewed and are negative.   Objective:  Physical Exam Vitals reviewed.  Constitutional:      General: She is not in acute distress.    Appearance: She is well-developed.  HENT:     Head: Normocephalic and atraumatic.  Eyes:     General: No scleral icterus.    Conjunctiva/sclera: Conjunctivae normal.     Pupils: Pupils are equal, round, and reactive to light.  Neck:     Vascular: No JVD.     Trachea: No tracheal deviation.  Cardiovascular:     Rate and Rhythm: Normal rate and regular rhythm.     Heart sounds: Normal heart sounds. No murmur heard. Pulmonary:     Effort: Pulmonary effort is normal. No tachypnea, accessory muscle usage or  respiratory distress.     Breath sounds: No stridor. No wheezing, rhonchi or rales.  Abdominal:     General: Bowel sounds are normal. There is no distension.     Palpations: Abdomen is soft.     Tenderness: There is no abdominal tenderness.  Musculoskeletal:        General: No tenderness.     Cervical back: Neck supple.  Lymphadenopathy:     Cervical: No cervical adenopathy.  Skin:    General: Skin is warm and dry.     Capillary Refill: Capillary refill takes less than 2 seconds.     Findings: No rash.  Neurological:     Mental Status: She is alert and oriented to person, place, and time.  Psychiatric:        Behavior: Behavior normal.     Vitals:   06/11/21 1446  BP: 138/70  Pulse: 87  Temp: 98.2 F (36.8 C)  TempSrc: Oral  SpO2: 97%  Weight: 193 lb 6.4 oz (87.7 kg)  Height: 4\' 11"  (1.499 m)   97% on  RA BMI Readings from Last 3 Encounters:  06/11/21 39.06 kg/m  05/22/21 39.18 kg/m  05/01/21 39.30 kg/m   Wt Readings from Last 3 Encounters:  06/11/21 193 lb 6.4 oz (87.7 kg)  05/22/21 194 lb (88 kg)  05/01/21 194 lb 9.6 oz (88.3 kg)     CBC    Component Value Date/Time   WBC 9.3 04/24/2021 1224   RBC 4.27 04/24/2021 1224   HGB 12.8 04/24/2021 1224   HCT 39.1 04/24/2021 1224   PLT 271 04/24/2021 1224   MCV 91.6 04/24/2021 1224   MCH 30.0 04/24/2021 1224   MCHC 32.7 04/24/2021 1224   RDW 12.7 04/24/2021 1224   LYMPHSABS 2.6 04/24/2021 1224   MONOABS 0.8 04/24/2021 1224   EOSABS 0.3 04/24/2021 1224   BASOSABS 0.1 04/24/2021 1224    Chest Imaging: 04/30/2021 nuclear medicine PET scan: PET avid upper lobe lesion on the right consistent with malignancy.  Has a enlarged lymph node within the mediastinum.  Has no significant PET avidity. The patient's images have been independently reviewed by me.    Pulmonary Functions Testing Results: PFT Results Latest Ref Rng & Units 05/28/2021  FVC-Pre L 1.85  FVC-Predicted Pre % 74  FVC-Post L 1.86   FVC-Predicted Post % 75  Pre FEV1/FVC % % 81  Post FEV1/FCV % % 82  FEV1-Pre L 1.49  FEV1-Predicted Pre % 79  FEV1-Post L 1.52  DLCO uncorrected ml/min/mmHg 16.25  DLCO UNC% % 98  DLVA Predicted % 105  TLC L 4.18  TLC % Predicted % 97  RV % Predicted % 113    FeNO:   Pathology:   Echocardiogram:   Heart Catheterization:     Assessment & Plan:     ICD-10-CM   1. Lung nodules  R91.8 Procedural/ Surgical Case Request: VIDEO BRONCHOSCOPY WITH ENDOBRONCHIAL NAVIGATION, VIDEO BRONCHOSCOPY WITH ENDOBRONCHIAL ULTRASOUND    Ambulatory referral to Pulmonology    CT Super D Chest Wo Contrast    2. Adenopathy  R59.9 Procedural/ Surgical Case Request: VIDEO BRONCHOSCOPY WITH ENDOBRONCHIAL NAVIGATION, VIDEO BRONCHOSCOPY WITH ENDOBRONCHIAL ULTRASOUND    Ambulatory referral to Pulmonology    CT Super D Chest Wo Contrast    3. Malignant neoplasm of upper lobe of right lung (HCC)  C34.11       Discussion: This is a 70 year old female with a new diagnosis of a right upper lobe lung cancer.  Has underwent bronchoscopy already as well as endobronchial ultrasound with biopsy of station 4R.  Has been seen by thoracic surgery.  Thoracic surgery is still concerned that the 4R node due to its size is potentially malignant.  And they would like this resampled before consideration for surgery.  Therefore patient was referred to me for repeat biopsy.  Plan: I have discussed case with Dr. Kipp Brood. He is also concerned about the contralateral nodules within the chest. We will plan for a combined procedure again to go after the small nodules within the left chest as well as the node within the mediastinum. I believe if these are negative then they are going to consider moving forward with surgery. We will get her scheduled as soon as possible. I called endoscopy with an attempt to try to get her scheduled for next week.  There is no additional available dates due to the limited amount of endoscopy  staffing at the moment. Our next available date to schedule bronchoscopy is going to be on 06/23/2021.    Current Outpatient Medications:    albuterol (VENTOLIN HFA) 108 (90 Base) MCG/ACT inhaler, Inhale 1 puff into the lungs every 6 (six) hours as needed for wheezing., Disp: , Rfl:    Ascorbic Acid (VITAMIN C) 1000 MG tablet, Take 1,000 mg by mouth in the morning and at bedtime., Disp: , Rfl:    aspirin EC 81 MG tablet, Take 81 mg by mouth at bedtime., Disp: , Rfl:    bisoprolol-hydrochlorothiazide (ZIAC) 5-6.25 MG tablet, Take 1 tablet by mouth daily., Disp: , Rfl:    clonazePAM (KLONOPIN) 0.5 MG tablet, Take 0.5 mg by mouth 3 (three) times daily as needed for anxiety., Disp: , Rfl:    EPINEPHrine 0.3 mg/0.3 mL IJ SOAJ injection, Inject 0.3 mLs (0.3 mg total) into the muscle as needed for anaphylaxis., Disp: 2 each, Rfl: 0   fluticasone-salmeterol (ADVAIR) 250-50 MCG/ACT AEPB, Inhale 1 puff into the lungs in the morning and at bedtime., Disp: , Rfl:    gabapentin (NEURONTIN) 300 MG capsule, Take 300 mg by mouth  3 (three) times daily., Disp: , Rfl:    hydrochlorothiazide (HYDRODIURIL) 25 MG tablet, Take 25 mg by mouth daily., Disp: , Rfl:    HYDROcodone-acetaminophen (NORCO) 10-325 MG tablet, Take 1 tablet by mouth 5 (five) times daily., Disp: , Rfl:    ipratropium-albuterol (DUONEB) 0.5-2.5 (3) MG/3ML SOLN, Take 3 mLs by nebulization in the morning, at noon, in the evening, and at bedtime., Disp: , Rfl:    loratadine-pseudoephedrine (CLARITIN-D 24-HOUR) 10-240 MG 24 hr tablet, Take 1 tablet by mouth daily., Disp: , Rfl:    Melatonin 10 MG TABS, Take 10 mg by mouth at bedtime., Disp: , Rfl:    Multiple Vitamin (MULTI-VITAMIN) tablet, Take 1 tablet by mouth daily., Disp: , Rfl:    nystatin cream (MYCOSTATIN), Apply 1 application topically 2 (two) times daily as needed for dry skin (around lips)., Disp: , Rfl:    ondansetron (ZOFRAN) 8 MG tablet, Take 1 tablet (8 mg total) by mouth every 8 (eight)  hours as needed for nausea or vomiting., Disp: 20 tablet, Rfl: 1   pantoprazole (PROTONIX) 40 MG tablet, Take 40 mg by mouth at bedtime. , Disp: , Rfl:    potassium chloride SA (KLOR-CON) 20 MEQ tablet, Take 20 mEq by mouth 2 (two) times daily., Disp: , Rfl:    pravastatin (PRAVACHOL) 80 MG tablet, Take 80 mg by mouth at bedtime. , Disp: , Rfl:    Probiotic Product (PROBIOTIC DAILY PO), Take 1 capsule by mouth daily., Disp: , Rfl:    tiZANidine (ZANAFLEX) 4 MG tablet, Take 4 mg by mouth 3 (three) times daily., Disp: , Rfl:    venlafaxine XR (EFFEXOR-XR) 75 MG 24 hr capsule, Take 150 mg by mouth at bedtime., Disp: , Rfl:   I spent 63 minutes dedicated to the care of this patient on the date of this encounter to include pre-visit review of records, face-to-face time with the patient discussing conditions above, post visit ordering of testing, clinical documentation with the electronic health record, making appropriate referrals as documented, and communicating necessary findings to members of the patients care team.   Garner Nash, DO [facility] 06/11/2021 3:00 PM

## 2021-06-11 NOTE — Telephone Encounter (Signed)
LVM for pt to call back regarding the following info:   CT 9/16 at 2:00pm; 1:45pm arrival time at [facility]. (Sending Super D Disk to [facility] END[facility])  Covid test 9/16 between 8-3pm  ENB/EBUS 9/20 at 1:00pm; 10:30am arrival at [facility]  Also mailed letter.

## 2021-06-12 ENCOUNTER — Ambulatory Visit: Payer: Medicare HMO

## 2021-06-12 NOTE — Telephone Encounter (Signed)
I spoke to pt & gave her appt info.

## 2021-06-15 ENCOUNTER — Ambulatory Visit: Payer: Medicare HMO

## 2021-06-15 ENCOUNTER — Other Ambulatory Visit: Payer: Self-pay | Admitting: Internal Medicine

## 2021-06-15 DIAGNOSIS — Z1231 Encounter for screening mammogram for malignant neoplasm of breast: Secondary | ICD-10-CM

## 2021-06-16 ENCOUNTER — Ambulatory Visit: Payer: Medicare HMO

## 2021-06-17 ENCOUNTER — Ambulatory Visit: Payer: Medicare HMO

## 2021-06-18 ENCOUNTER — Ambulatory Visit: Payer: Medicare HMO

## 2021-06-18 ENCOUNTER — Other Ambulatory Visit: Payer: Self-pay | Admitting: Internal Medicine

## 2021-06-19 ENCOUNTER — Other Ambulatory Visit: Payer: Self-pay

## 2021-06-19 ENCOUNTER — Ambulatory Visit: Payer: Medicare HMO

## 2021-06-19 ENCOUNTER — Ambulatory Visit (INDEPENDENT_AMBULATORY_CARE_PROVIDER_SITE_OTHER)
Admission: RE | Admit: 2021-06-19 | Discharge: 2021-06-19 | Disposition: A | Discharge status: Home / Self Care | Payer: Medicare HMO | Source: Ambulatory Visit | Attending: Pulmonary Disease | Admitting: Pulmonary Disease

## 2021-06-19 ENCOUNTER — Encounter (HOSPITAL_COMMUNITY): Payer: Self-pay | Admitting: Pulmonary Disease

## 2021-06-19 ENCOUNTER — Other Ambulatory Visit: Payer: Self-pay | Admitting: Pulmonary Disease

## 2021-06-19 DIAGNOSIS — R918 Other nonspecific abnormal finding of lung field: Secondary | ICD-10-CM

## 2021-06-19 DIAGNOSIS — R599 Enlarged lymph nodes, unspecified: Secondary | ICD-10-CM | POA: Diagnosis not present

## 2021-06-19 DIAGNOSIS — I700 Atherosclerosis of aorta: Secondary | ICD-10-CM | POA: Diagnosis not present

## 2021-06-19 DIAGNOSIS — J439 Emphysema, unspecified: Secondary | ICD-10-CM | POA: Diagnosis not present

## 2021-06-19 NOTE — Progress Notes (Signed)
Anesthesia Chart Review: SAME DAY WORK-UP  Case: [id number] Date/Time: 06/23/21 1300   Procedures:      VIDEO BRONCHOSCOPY WITH ENDOBRONCHIAL NAVIGATION (Left) - ION     VIDEO BRONCHOSCOPY WITH ENDOBRONCHIAL ULTRASOUND   Anesthesia type: General   Diagnosis:      Lung nodules [R91.8]     Adenopathy [R59.9]   Pre-op diagnosis: lung nodule, adenopathy   Location: [facility] RO[facility] 3 / [place]   Surgeons: Garner Nash, DO       DISCUSSION: Patient is a 70 year old female scheduled for the above procedure. She is s/p fiberoptic bronchoscopy/ENB by Ottie Glazier, MD on 04/15/21. RUL biopsy pathology + non-small cell carcinoma, favor SCC with negative peritracheal region LN. She was referred to CT surgeon Dr. Kipp Brood. Although LN biopsy negative, she had enlarged subcarinal and paratracheal LN on imaging and was concerned these could represent metastatic disease. He was also concerned about contralateral nodules. He referred the patient to Dr. Valeta Harms for re-evaluation and potential robotic bronchoscopy and EBUS.   History includes former smoker (quit 10/04/17), HTN, DM2, asthma, COPD, GERD, HLD, angioedema (12/2019, after eating at a [place]), neck surgery (C5 corpectomy, ACDF C3-6 09/19/08). She has 3V coronary artery calcifications and aortic atherosclerosis by 04/02/21 CT. 3.2 cm AAA on 04/30/21 CT and evidence of an old left basal ganglia infarct on 04/28/21 head CT.  As of 05/22/21, Dr. Kipp Brood classified her Zubrod Score as 1:  Restricted in physical strenuous activity but ambulatory, able to do out light work.  04/14/21 EKG showed NSR. Echo 05/04/21 showed LVEF 60-65%, no regional wall motion abnormalities, grade 1 DD, normal RVSF, mild MR. Known history of DOE. BNP normal 05/01/21.   She is a same-day work-up, but tolerated bronchoscopy under GETA on 04/15/21.  Anesthesia team to evaluate on the day of surgery.  On 1622 super D chest CT report is in process.   VS:  BP  Readings from Last 3 Encounters:  06/11/21 138/70  05/22/21 133/74  05/01/21 123/67   Pulse Readings from Last 3 Encounters:  06/11/21 87  05/22/21 78  05/01/21 68     PROVIDERS: Idelle Crouch, MD is PCP [facility], [facility]) Ottie Glazier, MD is pulmonologist [facility], [facility]) Melodie Bouillon, MD is CT surgeon Charlaine Dalton, MD is [facility]   LABS: Currently last lab results include: Lab Results  Component Value Date   WBC 9.3 04/24/2021   HGB 12.8 04/24/2021   HCT 39.1 04/24/2021   PLT 271 04/24/2021   GLUCOSE 132 (H) 04/24/2021   ALT 19 04/24/2021   AST 42 (H) 04/24/2021   NA 136 04/24/2021   K 3.5 04/24/2021   CL 98 04/24/2021   CREATININE 0.59 04/24/2021   BUN 8 04/24/2021   CO2 28 04/24/2021  WBC 9.2, H/H 14.0/41.6, PLT 205, A1c 7.3% on 03/20/21 ([facility] CE). BNP 24.8 on 05/01/21.    PFTs 05/28/21: FVC 1.85 (74%), post 1.86 (75%). FEV11.49 (79%), post 1.52 (81%). DLCO unc 16.25 (98%).   IMAGES: CT Super D Chest 06/19/21: In process.  PET Scan 04/30/21: IMPRESSION: 1. Hypermetabolic 3 cm right upper lobe lung mass consistent with known primary neoplasm. This lesion abuts the pleura and there is likely pleural involvement but no definite chest wall invasion. 2. No hypermetabolic mediastinal or hilar adenopathy and no findings for metastatic disease involving the neck, abdomen/pelvis or bony structures. 3. Persistent ground-glass nodules in both lungs which will require continued surveillance. 4. 3.2 cm abdominal aortic aneurysm as  detailed above.   CT Head 04/28/21: IMPRESSION: No evidence of metastatic disease. No acute finding. 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   EKG: 04/14/21: NSR   CV: Echo 05/04/21: IMPRESSIONS   1. Left ventricular ejection fraction, by estimation, is 60 to 65%. The  left ventricle has normal function. The left ventricle has no regional  wall motion abnormalities. Left ventricular diastolic parameters  are  consistent with Grade I diastolic  dysfunction (impaired relaxation).   2. Right ventricular systolic function is normal. The right ventricular  size is normal.   3. The mitral valve is normal in structure. Mild mitral valve  regurgitation. No evidence of mitral stenosis.   4. The aortic valve is normal in structure. Aortic valve regurgitation is  not visualized. No aortic stenosis is present.   5. The inferior vena cava is normal in size with greater than 50%  respiratory variability, suggesting right atrial pressure of 3 mmHg.    Past Medical History:  Diagnosis Date   Angioedema 12/08/2019   Anxiety    Aortic atherosclerosis (HCC)    Asthma    CAD (coronary artery disease)    3 vessel   Cancer (HCC)    COPD (chronic obstructive pulmonary disease) ([place]) 12/08/2019   DDD (degenerative disc disease), lumbar    Depression    Diabetes mellitus without complication (HCC)    no meds, diet controlled   GERD (gastroesophageal reflux disease)    HTN (hypertension) 12/08/2019   Hyperlipidemia    Hypertension    Mass of upper lobe of right lung 04/02/2021   spiculated mass RIGHT posterior pulmonary apex with associated hilar and pretracheal LAD; measured 3.2 x 2.8 cm   Obesity (BMI 30-39.9)    T2DM (type 2 diabetes mellitus) ([place]) 12/08/2019    Past Surgical History:  Procedure Laterality Date   BACK SURGERY     lumbar L5-S1 ruptured disc x 3 surgeries   CERVICAL FUSION  2009   LAPAROSCOPIC TOTAL HYSTERECTOMY  1998   with oophorectomy   TONSILLECTOMY     VIDEO BRONCHOSCOPY WITH ENDOBRONCHIAL NAVIGATION N/A 04/15/2021   Procedure: VIDEO BRONCHOSCOPY WITH ENDOBRONCHIAL NAVIGATION;  Surgeon: Ottie Glazier, MD;  Location: [facility] ORS;  Service: Thoracic;  Laterality: N/A;   VIDEO BRONCHOSCOPY WITH ENDOBRONCHIAL ULTRASOUND N/A 04/15/2021   Procedure: VIDEO BRONCHOSCOPY WITH ENDOBRONCHIAL ULTRASOUND;  Surgeon: Ottie Glazier, MD;  Location: [facility] ORS;  Service: Thoracic;   Laterality: N/A;    MEDICATIONS: No current facility-administered medications for this encounter.    albuterol (VENTOLIN HFA) 108 (90 Base) MCG/ACT inhaler   Ascorbic Acid (VITAMIN C) 1000 MG tablet   aspirin EC 81 MG tablet   bisoprolol-hydrochlorothiazide (ZIAC) 5-6.25 MG tablet   clonazePAM (KLONOPIN) 0.5 MG tablet   EPINEPHrine 0.3 mg/0.3 mL IJ SOAJ injection   fluticasone-salmeterol (ADVAIR) 250-50 MCG/ACT AEPB   gabapentin (NEURONTIN) 300 MG capsule   hydrochlorothiazide (HYDRODIURIL) 25 MG tablet   HYDROcodone-acetaminophen (NORCO) 10-325 MG tablet   ipratropium-albuterol (DUONEB) 0.5-2.5 (3) MG/3ML SOLN   loratadine-pseudoephedrine (CLARITIN-D 24-HOUR) 10-240 MG 24 hr tablet   Melatonin 10 MG TABS   Multiple Vitamin (MULTI-VITAMIN) tablet   nystatin cream (MYCOSTATIN)   ondansetron (ZOFRAN) 8 MG tablet   pantoprazole (PROTONIX) 40 MG tablet   potassium chloride SA (KLOR-CON) 20 MEQ tablet   pravastatin (PRAVACHOL) 80 MG tablet   Probiotic Product (PROBIOTIC DAILY PO)   tiZANidine (ZANAFLEX) 4 MG tablet   venlafaxine XR (EFFEXOR-XR) 75 MG 24 hr capsule    Myra Gianotti, PA-C  Surgical Short Stay/Anesthesiology [facility] Phone [phone] [facility] Phone [phone] 06/19/2021 2:05 PM

## 2021-06-19 NOTE — Anesthesia Preprocedure Evaluation (Addendum)
Anesthesia Evaluation  Patient identified by MRN, date of birth, ID band Patient awake    Reviewed: Allergy & Precautions, NPO status , Patient's Chart, lab work & pertinent test results  Airway Mallampati: III  TM Distance: >3 FB Neck ROM: Full  Mouth opening: Limited Mouth Opening  Dental  (+) Dental Advisory Given, Edentulous Upper, Edentulous Lower   Pulmonary asthma , COPD, former smoker,    Pulmonary exam normal breath sounds clear to auscultation       Cardiovascular hypertension, Pt. on medications and Pt. on home beta blockers + CAD  Normal cardiovascular exam Rhythm:Regular Rate:Normal  Echo 05/04/2021 1. Left ventricular ejection fraction, by estimation, is 60 to 65%. The left ventricle has normal function. The left ventricle has no regional wall motion abnormalities. Left ventricular diastolic parameters are consistent with Grade I diastolic dysfunction (impaired relaxation).  2. Right ventricular systolic function is normal. The right ventricular size is normal.  3. The mitral valve is normal in structure. Mild mitral valve  regurgitation. No evidence of mitral stenosis.  4. The aortic valve is normal in structure. Aortic valve regurgitation is not visualized. No aortic stenosis is present.  5. The inferior vena cava is normal in size with greater than 50% respiratory variability, suggesting right atrial pressure of 3 mmHg.    Neuro/Psych PSYCHIATRIC DISORDERS Anxiety Depression negative neurological ROS     GI/Hepatic Neg liver ROS, GERD  ,  Endo/Other  diabetes  Renal/GU negative Renal ROS     Musculoskeletal  (+) Arthritis ,   Abdominal (+) + obese,   Peds  Hematology negative hematology ROS (+)   Anesthesia Other Findings   Reproductive/Obstetrics                                                            Anesthesia Evaluation  Patient identified by MRN, date of birth, ID  band Patient awake    Reviewed: Allergy & Precautions, NPO status , Patient's Chart, lab work & pertinent test results  Airway Mallampati: II  TM Distance: >3 FB Neck ROM: Full    Dental  (+) Edentulous Upper, Edentulous Lower   Pulmonary COPD,  COPD inhaler, former smoker,    Pulmonary exam normal        Cardiovascular hypertension, Pt. on medications + CAD  Normal cardiovascular exam     Neuro/Psych PSYCHIATRIC DISORDERS Anxiety Depression negative neurological ROS     GI/Hepatic Neg liver ROS, GERD  Medicated,  Endo/Other  diabetesMorbid obesity  Renal/GU negative Renal ROS  negative genitourinary   Musculoskeletal  (+) Arthritis , Osteoarthritis,    Abdominal   Peds negative pediatric ROS (+)  Hematology negative hematology ROS (+)   Anesthesia Other Findings Angioedema 12/08/2019   Anxiety    COPD (chronic obstructive pulmonary disease) ([place]) 12/08/2019  DDD (degenerative disc disease), lumbar Depression    Diabetes mellitus without complication (HCC)    GERD (gastroesophageal reflux disease) HTN (hypertension) 12/08/2019  Hyperlipidemia    Hypertension    Obesity (BMI 30-39.9)    T2DM (type 2 diabetes mellitus) ([place]) 12/08/2019      Reproductive/Obstetrics negative OB ROS                            Anesthesia Physical Anesthesia  Plan  ASA: 3  Anesthesia Plan: General   Post-op Pain Management:    Induction: Intravenous  PONV Risk Score and Plan: 2 and Propofol infusion and Midazolam  Airway Management Planned: Oral ETT  Additional Equipment:   Intra-op Plan:   Post-operative Plan: Extubation in OR  Informed Consent: I have reviewed the patients History and Physical, chart, labs and discussed the procedure including the risks, benefits and alternatives for the proposed anesthesia with the patient or authorized representative who has indicated his/her understanding and acceptance.        Plan Discussed with: CRNA, Anesthesiologist and Surgeon  Anesthesia Plan Comments:         Anesthesia Quick Evaluation  Anesthesia Physical Anesthesia Plan  ASA: 3  Anesthesia Plan: General   Post-op Pain Management:    Induction: Intravenous  PONV Risk Score and Plan: 3 and Ondansetron, Dexamethasone, Treatment may vary due to age or medical condition and Midazolam  Airway Management Planned: Oral ETT  Additional Equipment: None  Intra-op Plan:   Post-operative Plan: Extubation in OR  Informed Consent: I have reviewed the patients History and Physical, chart, labs and discussed the procedure including the risks, benefits and alternatives for the proposed anesthesia with the patient or authorized representative who has indicated his/her understanding and acceptance.     Dental advisory given  Plan Discussed with: CRNA  Anesthesia Plan Comments: (See PAT note written 06/19/2021 by Myra Gianotti, PA-C. )      Anesthesia Quick Evaluation

## 2021-06-19 NOTE — Progress Notes (Signed)
DUE TO COVID-19 ONLY ONE VISITOR IS ALLOWED TO COME WITH YOU AND STAY IN THE WAITING ROOM ONLY DURING PRE OP AND PROCEDURE DAY OF SURGERY.   PCP - Dr Fulton Reek Cardiologist - n/a  Chest x-ray - 04/15/21 (1V) EKG - 04/22/21 Stress Test - n/a ECHO - 05/04/21 Cardiac Cath - n/a  ICD Pacemaker/Loop - n/a  Sleep Study -  n/a CPAP - none  Aspirin Instructions: Follow your surgeon's instructions on when to stop aspirin prior to surgery,  If no instructions were given by your surgeon then you will need to call the office for those instructions.  Anesthesia review: Yes  STOP now taking any Aspirin (unless otherwise instructed by your surgeon), Aleve, Naproxen, Ibuprofen, Motrin, Advil, Goody's, BC's, all herbal medications, fish oil, and all vitamins.   Coronavirus Screening Covid test is scheduled on 06/19/21 Do you have any of the following symptoms:  Cough yes/no: No Fever (>100.18F)  yes/no: No Runny nose yes/no: No Sore throat yes/no: No Difficulty breathing/shortness of breath  yes/no: No  Have you traveled in the last 14 days and where? yes/no: No  Patient verbalized understanding of instructions that were given via phone.

## 2021-06-20 LAB — SARS CORONAVIRUS 2 (TAT 6-24 HRS): SARS Coronavirus 2: NEGATIVE

## 2021-06-22 ENCOUNTER — Ambulatory Visit: Payer: Medicare HMO

## 2021-06-23 ENCOUNTER — Encounter (HOSPITAL_COMMUNITY)
Admission: RE | Disposition: A | Discharge status: Home / Self Care | Payer: Self-pay | Source: Ambulatory Visit | Attending: Pulmonary Disease

## 2021-06-23 ENCOUNTER — Ambulatory Visit (HOSPITAL_COMMUNITY): Payer: Medicare HMO | Admitting: Vascular Surgery

## 2021-06-23 ENCOUNTER — Ambulatory Visit: Payer: Medicare HMO

## 2021-06-23 ENCOUNTER — Encounter (HOSPITAL_COMMUNITY): Payer: Self-pay | Admitting: Pulmonary Disease

## 2021-06-23 ENCOUNTER — Other Ambulatory Visit: Payer: Self-pay

## 2021-06-23 ENCOUNTER — Ambulatory Visit (HOSPITAL_COMMUNITY)
Admission: RE | Admit: 2021-06-23 | Discharge: 2021-06-23 | Disposition: A | Discharge status: Home / Self Care | Payer: Medicare HMO | Source: Ambulatory Visit | Attending: Pulmonary Disease | Admitting: Pulmonary Disease

## 2021-06-23 DIAGNOSIS — J449 Chronic obstructive pulmonary disease, unspecified: Secondary | ICD-10-CM | POA: Diagnosis not present

## 2021-06-23 DIAGNOSIS — I700 Atherosclerosis of aorta: Secondary | ICD-10-CM | POA: Diagnosis not present

## 2021-06-23 DIAGNOSIS — R590 Localized enlarged lymph nodes: Secondary | ICD-10-CM | POA: Diagnosis not present

## 2021-06-23 DIAGNOSIS — R918 Other nonspecific abnormal finding of lung field: Secondary | ICD-10-CM | POA: Diagnosis not present

## 2021-06-23 DIAGNOSIS — Z7982 Long term (current) use of aspirin: Secondary | ICD-10-CM | POA: Diagnosis not present

## 2021-06-23 DIAGNOSIS — Z79899 Other long term (current) drug therapy: Secondary | ICD-10-CM | POA: Insufficient documentation

## 2021-06-23 DIAGNOSIS — R599 Enlarged lymph nodes, unspecified: Secondary | ICD-10-CM

## 2021-06-23 DIAGNOSIS — I10 Essential (primary) hypertension: Secondary | ICD-10-CM | POA: Insufficient documentation

## 2021-06-23 DIAGNOSIS — Z87891 Personal history of nicotine dependence: Secondary | ICD-10-CM | POA: Diagnosis not present

## 2021-06-23 DIAGNOSIS — C3490 Malignant neoplasm of unspecified part of unspecified bronchus or lung: Secondary | ICD-10-CM | POA: Diagnosis present

## 2021-06-23 DIAGNOSIS — E785 Hyperlipidemia, unspecified: Secondary | ICD-10-CM | POA: Diagnosis not present

## 2021-06-23 DIAGNOSIS — Z886 Allergy status to analgesic agent status: Secondary | ICD-10-CM | POA: Diagnosis not present

## 2021-06-23 DIAGNOSIS — Z888 Allergy status to other drugs, medicaments and biological substances status: Secondary | ICD-10-CM | POA: Diagnosis not present

## 2021-06-23 DIAGNOSIS — C3411 Malignant neoplasm of upper lobe, right bronchus or lung: Secondary | ICD-10-CM | POA: Diagnosis not present

## 2021-06-23 HISTORY — PX: FINE NEEDLE ASPIRATION: SHX5430

## 2021-06-23 HISTORY — PX: VIDEO BRONCHOSCOPY WITH ENDOBRONCHIAL ULTRASOUND: SHX6177

## 2021-06-23 LAB — CBC
HCT: 38.5 % (ref 36.0–46.0)
Hemoglobin: 12.3 g/dL (ref 12.0–15.0)
MCH: 29.4 pg (ref 26.0–34.0)
MCHC: 31.9 g/dL (ref 30.0–36.0)
MCV: 91.9 fL (ref 80.0–100.0)
Platelets: 214 10*3/uL (ref 150–400)
RBC: 4.19 MIL/uL (ref 3.87–5.11)
RDW: 13.2 % (ref 11.5–15.5)
WBC: 8.1 10*3/uL (ref 4.0–10.5)
nRBC: 0 % (ref 0.0–0.2)

## 2021-06-23 LAB — GLUCOSE, CAPILLARY: Glucose-Capillary: 185 mg/dL — ABNORMAL HIGH (ref 70–99)

## 2021-06-23 LAB — BASIC METABOLIC PANEL
Anion gap: 12 (ref 5–15)
BUN: 11 mg/dL (ref 8–23)
CO2: 25 mmol/L (ref 22–32)
Calcium: 9.1 mg/dL (ref 8.9–10.3)
Chloride: 100 mmol/L (ref 98–111)
Creatinine, Ser: 0.78 mg/dL (ref 0.44–1.00)
GFR, Estimated: >60 mL/min (ref >60)
Glucose, Bld: 181 mg/dL — ABNORMAL HIGH (ref 70–99)
Potassium: 3.6 mmol/L (ref 3.5–5.1)
Sodium: 137 mmol/L (ref 135–145)

## 2021-06-23 SURGERY — BRONCHOSCOPY, WITH EBUS
Anesthesia: General

## 2021-06-23 MED ORDER — LIDOCAINE 2% (20 MG/ML) 5 ML SYRINGE
INTRAMUSCULAR | Status: DC | PRN
  Administered 2021-06-23: 60 mg via INTRAVENOUS

## 2021-06-23 MED ORDER — CHLORHEXIDINE GLUCONATE 0.12 % MT SOLN
15.0000 mL | Freq: Once | OROMUCOSAL | Status: AC
  Filled 2021-06-23: qty 15

## 2021-06-23 MED ORDER — ONDANSETRON HCL 4 MG/2ML IJ SOLN
INTRAMUSCULAR | Status: DC | PRN
  Administered 2021-06-23: 4 mg via INTRAVENOUS

## 2021-06-23 MED ORDER — ROCURONIUM BROMIDE 10 MG/ML (PF) SYRINGE
PREFILLED_SYRINGE | INTRAVENOUS | Status: DC | PRN
  Administered 2021-06-23: 30 mg via INTRAVENOUS
  Administered 2021-06-23: 10 mg via INTRAVENOUS

## 2021-06-23 MED ORDER — DEXAMETHASONE SODIUM PHOSPHATE 10 MG/ML IJ SOLN
INTRAMUSCULAR | Status: DC | PRN
  Administered 2021-06-23: 4 mg via INTRAVENOUS

## 2021-06-23 MED ORDER — CHLORHEXIDINE GLUCONATE 0.12 % MT SOLN
OROMUCOSAL | Status: AC
  Administered 2021-06-23: 15 mL via OROMUCOSAL
  Filled 2021-06-23: qty 15

## 2021-06-23 MED ORDER — SUGAMMADEX SODIUM 200 MG/2ML IV SOLN
INTRAVENOUS | Status: DC | PRN
  Administered 2021-06-23: 200 mg via INTRAVENOUS

## 2021-06-23 MED ORDER — PROPOFOL 10 MG/ML IV BOLUS
INTRAVENOUS | Status: DC | PRN
  Administered 2021-06-23: 120 mg via INTRAVENOUS

## 2021-06-23 MED ORDER — PHENYLEPHRINE 40 MCG/ML (10ML) SYRINGE FOR IV PUSH (FOR BLOOD PRESSURE SUPPORT)
PREFILLED_SYRINGE | INTRAVENOUS | Status: DC | PRN
  Administered 2021-06-23: 80 ug via INTRAVENOUS
  Administered 2021-06-23: 40 ug via INTRAVENOUS

## 2021-06-23 MED ORDER — LACTATED RINGERS IV SOLN: INTRAVENOUS | Status: DC

## 2021-06-23 MED ORDER — FENTANYL CITRATE (PF) 250 MCG/5ML IJ SOLN
INTRAMUSCULAR | Status: DC | PRN
  Administered 2021-06-23: 50 ug via INTRAVENOUS

## 2021-06-23 SURGICAL SUPPLY — 29 items

## 2021-06-23 NOTE — Anesthesia Procedure Notes (Signed)
Procedure Name: Intubation Date/Time: 06/23/2021 12:44 PM Performed by: Jenne Campus, CRNA Pre-anesthesia Checklist: Patient identified, Emergency Drugs available, Suction available and Patient being monitored Patient Re-evaluated:Patient Re-evaluated prior to induction Oxygen Delivery Method: Circle System Utilized Preoxygenation: Pre-oxygenation with 100% oxygen Induction Type: IV induction Ventilation: Mask ventilation without difficulty Laryngoscope Size: McGraph and 3 Grade View: Grade I Tube type: Oral Tube size: 8.5 mm Number of attempts: 1 Airway Equipment and Method: Stylet, Oral airway, Rigid stylet and Video-laryngoscopy Placement Confirmation: ETT inserted through vocal cords under direct vision, positive ETCO2 and breath sounds checked- equal and bilateral Secured at: 21 cm Tube secured with: Tape Dental Injury: Teeth and Oropharynx as per pre-operative assessment

## 2021-06-23 NOTE — Interval H&P Note (Signed)
History and Physical Interval Note:  06/23/2021 11:40 AM  Kanoelani D Beam [name]  has presented today for surgery, with the diagnosis of lung nodule, adenopathy.  The various methods of treatment have been discussed with the patient and family. After consideration of risks, benefits and other options for treatment, the patient has consented to  Procedure(s): [place] (N/A) as a surgical intervention.  The patient's history has been reviewed, patient examined, no change in status, stable for surgery.  I have reviewed the patient's chart and labs.  Questions were answered to the patient's satisfaction.     [place]

## 2021-06-23 NOTE — Op Note (Signed)
Video Bronchoscopy with Endobronchial Ultrasound Procedure Note  Date of Operation: 06/23/2021  Pre-op Diagnosis: Non-small cell lung cancer, adenopathy  Post-op Diagnosis: Non-small cell lung cancer, adenopathy  Surgeon: Garner Nash, DO   Assistants: None   Anesthesia: General endotracheal anesthesia  Operation: Flexible video fiberoptic bronchoscopy with endobronchial ultrasound and biopsies.  Estimated Blood Loss: Minimal  Complications: None   Indications and History: Olivia Buchanan is a 70 y.o. female with non-small cell lung cancer, mediastinal adenopathy.  The risks, benefits, complications, treatment options and expected outcomes were discussed with the patient.  The possibilities of pneumothorax, pneumonia, reaction to medication, pulmonary aspiration, perforation of a viscus, bleeding, failure to diagnose a condition and creating a complication requiring transfusion or operation were discussed with the patient who freely signed the consent.    Description of Procedure: The patient was examined in the preoperative area and history and data from the preprocedure consultation were reviewed. It was deemed appropriate to proceed.  The patient was taken to [facility] endoscopy room 3, identified as Olivia Buchanan and the procedure verified as Flexible Video Fiberoptic Bronchoscopy.  A Time Out was held and the above information confirmed. After being taken to the operating room general anesthesia was initiated and the patient  was orally intubated. The video fiberoptic bronchoscope was introduced via the endotracheal tube and a general inspection was performed which showed normal right and left lung anatomy no evidence of endobronchial lesion. The standard scope was then withdrawn and the endobronchial ultrasound was used to identify and characterize the peritracheal, hilar and bronchial lymph nodes. Inspection showed enlarged station 4R, paratracheal node. Using real-time  ultrasound guidance Wang needle biopsies were take from Station 4R nodes and were sent for cytology. The patient tolerated the procedure well without apparent complications. There was no significant blood loss. The bronchoscope was withdrawn. Anesthesia was reversed and the patient was taken to the PACU for recovery.   Samples: 1. Wang needle biopsies from 4R node  Plans:  The patient will be discharged from the PACU to home when recovered from anesthesia. We will review the cytology, pathology and microbiology results with the patient when they become available. Outpatient followup will be with Dr. Kipp Brood, MD.   Garner Nash, DO [facility] 06/23/2021 2:10 PM

## 2021-06-23 NOTE — Discharge Instructions (Signed)
Flexible Bronchoscopy, Care After This sheet gives you information about how to care for yourself after your test. Your doctor may also give you more specific instructions. If you have problems or questions, contact your doctor. Follow these instructions at home: Eating and drinking Do not eat or drink anything (not even water) for 2 hours after your test, or until your numbing medicine (local anesthetic) wears off. When your numbness is gone and your cough and gag reflexes have come back, you may: Eat only soft foods. Slowly drink liquids. The day after the test, go back to your normal diet. Driving Do not drive for 24 hours if you were given a medicine to help you relax (sedative). Do not drive or use heavy machinery while taking prescription pain medicine. General instructions  Take over-the-counter and prescription medicines only as told by your doctor. Return to your normal activities as told. Ask what activities are safe for you. Do not use any products that have nicotine or tobacco in them. This includes cigarettes and e-cigarettes. If you need help quitting, ask your doctor. Keep all follow-up visits as told by your doctor. This is important. It is very important if you had a tissue sample (biopsy) taken. Get help right away if: You have shortness of breath that gets worse. You get light-headed. You feel like you are going to pass out (faint). You have chest pain. You cough up: More than a little blood. More blood than before. Summary Do not eat or drink anything (not even water) for 2 hours after your test, or until your numbing medicine wears off. Do not use cigarettes. Do not use e-cigarettes. Get help right away if you have chest pain.  This information is not intended to replace advice given to you by your health care provider. Make sure you discuss any questions you have with your health care provider. Document Released: 07/18/2009 Document Revised: 09/02/2017 Document  Reviewed: 10/08/2016 Elsevier Patient Education  2020 [employer].

## 2021-06-23 NOTE — Transfer of Care (Signed)
Immediate Anesthesia Transfer of Care Note  Patient: Olivia Buchanan  Procedure(s) Performed: VIDEO BRONCHOSCOPY WITH ENDOBRONCHIAL ULTRASOUND FINE NEEDLE ASPIRATION (FNA) LINEAR  Patient Location: PACU  Anesthesia Type:General  Level of Consciousness: oriented, drowsy and patient cooperative  Airway & Oxygen Therapy: Patient Spontanous Breathing and Patient connected to nasal cannula oxygen  Post-op Assessment: Report given to RN and Post -op Vital signs reviewed and stable  Post vital signs: Reviewed  Last Vitals:  Vitals Value Taken Time  BP 101/29 06/23/21 1410  Temp 36.8 C 06/23/21 1400  Pulse 77 06/23/21 1417  Resp 13 06/23/21 1417  SpO2 91 % 06/23/21 1417  Vitals shown include unvalidated device data.  Last Pain:  Vitals:   06/23/21 1400  TempSrc: Temporal  PainSc: 0-No pain      Patients Stated Pain Goal: 4 (60/60/04 5997)  Complications: No notable events documented.

## 2021-06-24 ENCOUNTER — Ambulatory Visit: Payer: Medicare HMO

## 2021-06-24 ENCOUNTER — Encounter (HOSPITAL_COMMUNITY): Payer: Self-pay | Admitting: Pulmonary Disease

## 2021-06-24 NOTE — Anesthesia Postprocedure Evaluation (Signed)
Anesthesia Post Note  Patient: Olivia Buchanan  Procedure(s) Performed: VIDEO BRONCHOSCOPY WITH ENDOBRONCHIAL ULTRASOUND FINE NEEDLE ASPIRATION (FNA) LINEAR     Patient location during evaluation: PACU Anesthesia Type: General Level of consciousness: sedated and patient cooperative Pain management: pain level controlled Vital Signs Assessment: post-procedure vital signs reviewed and stable Respiratory status: spontaneous breathing Cardiovascular status: stable Anesthetic complications: no   No notable events documented.  Last Vitals:  Vitals:   06/23/21 1431 06/23/21 1441  BP: (!) 124/50 (!) 123/48  Pulse: 78 81  Resp: 16 15  Temp:    SpO2: 94% 92%    Last Pain:  Vitals:   06/23/21 1441  TempSrc:   PainSc: 0-No pain                 Nolon Nations

## 2021-06-25 ENCOUNTER — Ambulatory Visit: Payer: Medicare HMO

## 2021-06-25 LAB — CYTOLOGY - NON PAP

## 2021-06-26 ENCOUNTER — Ambulatory Visit: Payer: Medicare HMO

## 2021-06-26 ENCOUNTER — Other Ambulatory Visit: Payer: Self-pay | Admitting: *Deleted

## 2021-06-26 ENCOUNTER — Encounter (HOSPITAL_COMMUNITY): Payer: Self-pay | Admitting: Thoracic Surgery (Cardiothoracic Vascular Surgery)

## 2021-06-26 ENCOUNTER — Encounter: Payer: Self-pay | Admitting: *Deleted

## 2021-06-26 ENCOUNTER — Other Ambulatory Visit: Payer: Self-pay | Admitting: Thoracic Surgery (Cardiothoracic Vascular Surgery)

## 2021-06-26 DIAGNOSIS — C3411 Malignant neoplasm of upper lobe, right bronchus or lung: Secondary | ICD-10-CM

## 2021-06-26 DIAGNOSIS — Z01818 Encounter for other preprocedural examination: Secondary | ICD-10-CM

## 2021-06-26 DIAGNOSIS — R911 Solitary pulmonary nodule: Secondary | ICD-10-CM

## 2021-06-26 DIAGNOSIS — R918 Other nonspecific abnormal finding of lung field: Secondary | ICD-10-CM

## 2021-06-29 ENCOUNTER — Telehealth (HOSPITAL_COMMUNITY): Payer: Self-pay | Admitting: *Deleted

## 2021-06-29 ENCOUNTER — Ambulatory Visit: Payer: Medicare HMO

## 2021-06-29 NOTE — Telephone Encounter (Signed)
Left message on voicemail per DPR in reference to upcoming appointment scheduled on 07/01/21 with detailed instructions given per Myocardial Perfusion Study Information Sheet for the test. LM to arrive 15 minutes early, and that it is imperative to arrive on time for appointment to keep from having the test rescheduled. If you need to cancel or reschedule your appointment, please call the office within 24 hours of your appointment. Failure to do so may result in a cancellation of your appointment, and a $50 no show fee. Phone number given for call back for any questions. Kirstie Peri

## 2021-06-30 ENCOUNTER — Ambulatory Visit: Payer: Medicare HMO

## 2021-06-30 DIAGNOSIS — C3490 Malignant neoplasm of unspecified part of unspecified bronchus or lung: Secondary | ICD-10-CM | POA: Diagnosis not present

## 2021-07-01 ENCOUNTER — Ambulatory Visit (HOSPITAL_COMMUNITY): Payer: Medicare HMO | Attending: Cardiovascular Disease

## 2021-07-01 ENCOUNTER — Other Ambulatory Visit: Payer: Self-pay

## 2021-07-01 DIAGNOSIS — E785 Hyperlipidemia, unspecified: Secondary | ICD-10-CM | POA: Diagnosis not present

## 2021-07-01 DIAGNOSIS — Z01818 Encounter for other preprocedural examination: Secondary | ICD-10-CM | POA: Insufficient documentation

## 2021-07-01 DIAGNOSIS — C3411 Malignant neoplasm of upper lobe, right bronchus or lung: Secondary | ICD-10-CM | POA: Insufficient documentation

## 2021-07-01 DIAGNOSIS — Z01810 Encounter for preprocedural cardiovascular examination: Secondary | ICD-10-CM

## 2021-07-01 DIAGNOSIS — I10 Essential (primary) hypertension: Secondary | ICD-10-CM | POA: Diagnosis not present

## 2021-07-01 DIAGNOSIS — R0600 Dyspnea, unspecified: Secondary | ICD-10-CM | POA: Insufficient documentation

## 2021-07-01 LAB — MYOCARDIAL PERFUSION IMAGING
Base ST Depression (mm): 0 mm
LV dias vol: 18 mL (ref 46–106)
LV sys vol: 56 mL
Nuc Stress EF: 68 %
Peak HR: 76 {beats}/min
Rest HR: 67 {beats}/min
Rest Nuclear Isotope Dose: 10.8 mCi
SDS: 2
SRS: 0
SSS: 2
ST Depression (mm): 0 mm
Stress Nuclear Isotope Dose: 30.3 mCi
TID: 0.98

## 2021-07-01 MED ORDER — TECHNETIUM TC 99M TETROFOSMIN IV KIT
10.8000 | PACK | Freq: Once | INTRAVENOUS | Status: AC | PRN
  Administered 2021-07-01: 10.8 via INTRAVENOUS
  Filled 2021-07-01: qty 11

## 2021-07-01 MED ORDER — REGADENOSON 0.4 MG/5ML IV SOLN
0.4000 mg | Freq: Once | INTRAVENOUS | Status: AC
  Administered 2021-07-01: 0.4 mg via INTRAVENOUS

## 2021-07-01 MED ORDER — TECHNETIUM TC 99M TETROFOSMIN IV KIT
30.3000 | PACK | Freq: Once | INTRAVENOUS | Status: AC | PRN
  Administered 2021-07-01: 30.3 via INTRAVENOUS
  Filled 2021-07-01: qty 31

## 2021-07-03 NOTE — Pre-Procedure Instructions (Signed)
Surgical Instructions    Your procedure is scheduled on Wednesday, July 08, 2021 AT 12:31 pm.  Report to [facility] Main Entrance "A" at 10:30 A.M., then check in with the Admitting office.  Call this number if you have problems the morning of surgery:  [phone]   If you have any questions prior to your surgery date call [phone]: Open Monday-Friday 8am-4pm    Remember:  Do not eat or drink after midnight the night before your surgery    Take these medicines the morning of surgery with A SIP OF WATER:  fluticasone-salmeterol (ADVAIR) gabapentin (NEURONTIN) HYDROcodone-acetaminophen (NORCO) ipratropium-albuterol (DUONEB) tiZANidine (ZANAFLEX)  IF NEEDED: albuterol (VENTOLIN HFA) clonazePAM (KLONOPIN) EPINEPHrine ondansetron (ZOFRAN)  Please bring all inhalers with you the day of surgery.   Follow your surgeon's instructions on when to stop Aspirin.  If no instructions were given by your surgeon then you will need to call the office to get those instructions.     As of today, STOP taking any Aleve, Naproxen, Ibuprofen, Motrin, Advil, Goody's, BC's, all herbal medications, fish oil, and all vitamins.   HOW TO MANAGE YOUR DIABETES BEFORE AND AFTER SURGERY  Why is it important to control my blood sugar before and after surgery? Improving blood sugar levels before and after surgery helps healing and can limit problems. A way of improving blood sugar control is eating a healthy diet by:  Eating less sugar and carbohydrates  Increasing activity/exercise  Talking with your doctor about reaching your blood sugar goals High blood sugars (greater than 180 mg/dL) can raise your risk of infections and slow your recovery, so you will need to focus on controlling your diabetes during the weeks before surgery. Make sure that the doctor who takes care of your diabetes knows about your planned surgery including the date and location.  How do I manage my blood sugar before  surgery? Check your blood sugar at least 4 times a day, starting 2 days before surgery, to make sure that the level is not too high or low.  Check your blood sugar the morning of your surgery when you wake up and every 2 hours until you get to the Short Stay unit.  If your blood sugar is less than 70 mg/dL, you will need to treat for low blood sugar: Do not take insulin. Treat a low blood sugar (less than 70 mg/dL) with  cup of clear juice (cranberry or apple), 4 glucose tablets, OR glucose gel. Recheck blood sugar in 15 minutes after treatment (to make sure it is greater than 70 mg/dL). If your blood sugar is not greater than 70 mg/dL on recheck, call [phone] for further instructions. Report your blood sugar to the short stay nurse when you get to Short Stay.  If you are admitted to the hospital after surgery: Your blood sugar will be checked by the staff and you will probably be given insulin after surgery (instead of oral diabetes medicines) to make sure you have good blood sugar levels. The goal for blood sugar control after surgery is 80-180 mg/dL.             Do NOT Smoke (Tobacco/Vaping) or drink Alcohol 24 hours prior to your procedure.  If you use a CPAP at night, you may bring all equipment for your overnight stay.   Contacts, glasses, piercing's, hearing aid's, dentures or partials may not be worn into surgery, please bring cases for these belongings.    For patients admitted to the hospital, discharge  time will be determined by your treatment team.   Patients discharged the day of surgery will not be allowed to drive home, and someone needs to stay with them for 24 hours.  NO VISITORS WILL BE ALLOWED IN PRE-OP WHERE PATIENTS GET READY FOR SURGERY.  ONLY 1 SUPPORT PERSON MAY BE PRESENT IN THE WAITING ROOM WHILE YOU ARE IN SURGERY.  IF YOU ARE TO BE ADMITTED, ONCE YOU ARE IN YOUR ROOM YOU WILL BE ALLOWED TWO (2) VISITORS.  Minor children may have two parents present. Special  consideration for safety and communication needs will be reviewed on a case by case basis.   Special instructions:   [place]- Preparing For Surgery  Before surgery, you can play an important role. Because skin is not sterile, your skin needs to be as free of germs as possible. You can reduce the number of germs on your skin by washing with CHG (chlorahexidine gluconate) Soap before surgery.  CHG is an antiseptic cleaner which kills germs and bonds with the skin to continue killing germs even after washing.    Oral Hygiene is also important to reduce your risk of infection.  Remember - BRUSH YOUR TEETH THE MORNING OF SURGERY WITH YOUR REGULAR TOOTHPASTE  Please do not use if you have an allergy to CHG or antibacterial soaps. If your skin becomes reddened/irritated stop using the CHG.  Do not shave (including legs and underarms) for at least 48 hours prior to first CHG shower. It is OK to shave your face.  Please follow these instructions carefully.   Shower the NIGHT BEFORE SURGERY and the MORNING OF SURGERY  If you chose to wash your hair, wash your hair first as usual with your normal shampoo.  After you shampoo, rinse your hair and body thoroughly to remove the shampoo.  Use CHG Soap as you would any other liquid soap. You can apply CHG directly to the skin and wash gently with a scrungie or a clean washcloth.   Apply the CHG Soap to your body ONLY FROM THE NECK DOWN.  Do not use on open wounds or open sores. Avoid contact with your eyes, ears, mouth and genitals (private parts). Wash Face and genitals (private parts)  with your normal soap.   Wash thoroughly, paying special attention to the area where your surgery will be performed.  Thoroughly rinse your body with warm water from the neck down.  DO NOT shower/wash with your normal soap after using and rinsing off the CHG Soap.  Pat yourself dry with a CLEAN TOWEL.  Wear CLEAN PAJAMAS to bed the night before surgery  Place  CLEAN SHEETS on your bed the night before your surgery  DO NOT SLEEP WITH PETS.   Day of Surgery: Shower with CHG soap. Do not wear jewelry, make up, nail polish, gel polish, artificial nails, or any other type of covering on natural nails including finger and toenails. If patients have artificial nails, gel coating, etc. that need to be removed by a nail salon please have this removed prior to surgery. Surgery may need to be canceled/delayed if the surgeon/ anesthesia feels like the patient is unable to be adequately monitored. Do not wear lotions, powders, perfumes, or deodorant. Do not shave 48 hours prior to surgery.  Do not bring valuables to the hospital. [facility] is not responsible for any belongings or valuables. Wear Clean/Comfortable clothing the morning of surgery Remember to brush your teeth WITH YOUR REGULAR TOOTHPASTE.   Please read  over the following fact sheets that you were given.   3 days prior to your procedure or After your COVID test   You are not required to quarantine however you are required to wear a well-fitting mask when you are out and around people not in your household. If your mask becomes wet or soiled, replace with a new one.   Wash your hands often with soap and water for 20 seconds or clean your hands with an alcohol-based hand sanitizer that contains at least 60% alcohol.   Do not share personal items.   Notify your provider:  o if you are in close contact with someone who has COVID  o or if you develop a fever of 100.4 or greater, sneezing, cough, sore throat, shortness of breath or body aches.

## 2021-07-06 ENCOUNTER — Encounter (HOSPITAL_COMMUNITY): Payer: Self-pay

## 2021-07-06 ENCOUNTER — Encounter (HOSPITAL_COMMUNITY)
Admission: RE | Admit: 2021-07-06 | Discharge: 2021-07-06 | Disposition: A | Discharge status: Home / Self Care | Payer: Medicare HMO | Source: Ambulatory Visit | Attending: Thoracic Surgery (Cardiothoracic Vascular Surgery) | Admitting: Thoracic Surgery (Cardiothoracic Vascular Surgery)

## 2021-07-06 ENCOUNTER — Other Ambulatory Visit: Payer: Self-pay

## 2021-07-06 ENCOUNTER — Ambulatory Visit (HOSPITAL_COMMUNITY)
Admission: RE | Admit: 2021-07-06 | Discharge: 2021-07-06 | Disposition: A | Discharge status: Home / Self Care | Payer: Medicare HMO | Source: Ambulatory Visit | Attending: Thoracic Surgery (Cardiothoracic Vascular Surgery) | Admitting: Thoracic Surgery (Cardiothoracic Vascular Surgery)

## 2021-07-06 DIAGNOSIS — Z7982 Long term (current) use of aspirin: Secondary | ICD-10-CM | POA: Diagnosis not present

## 2021-07-06 DIAGNOSIS — Z20822 Contact with and (suspected) exposure to covid-19: Secondary | ICD-10-CM | POA: Insufficient documentation

## 2021-07-06 DIAGNOSIS — Z7951 Long term (current) use of inhaled steroids: Secondary | ICD-10-CM | POA: Diagnosis not present

## 2021-07-06 DIAGNOSIS — R918 Other nonspecific abnormal finding of lung field: Secondary | ICD-10-CM | POA: Insufficient documentation

## 2021-07-06 DIAGNOSIS — Z01818 Encounter for other preprocedural examination: Secondary | ICD-10-CM | POA: Insufficient documentation

## 2021-07-06 DIAGNOSIS — Z981 Arthrodesis status: Secondary | ICD-10-CM | POA: Diagnosis not present

## 2021-07-06 DIAGNOSIS — Z23 Encounter for immunization: Secondary | ICD-10-CM | POA: Diagnosis not present

## 2021-07-06 DIAGNOSIS — I700 Atherosclerosis of aorta: Secondary | ICD-10-CM | POA: Diagnosis not present

## 2021-07-06 DIAGNOSIS — C3411 Malignant neoplasm of upper lobe, right bronchus or lung: Secondary | ICD-10-CM

## 2021-07-06 DIAGNOSIS — J449 Chronic obstructive pulmonary disease, unspecified: Secondary | ICD-10-CM | POA: Diagnosis not present

## 2021-07-06 DIAGNOSIS — J9382 Other air leak: Secondary | ICD-10-CM | POA: Diagnosis present

## 2021-07-06 DIAGNOSIS — I517 Cardiomegaly: Secondary | ICD-10-CM | POA: Diagnosis not present

## 2021-07-06 DIAGNOSIS — F32A Depression, unspecified: Secondary | ICD-10-CM | POA: Diagnosis not present

## 2021-07-06 DIAGNOSIS — Z6839 Body mass index (BMI) 39.0-39.9, adult: Secondary | ICD-10-CM | POA: Diagnosis not present

## 2021-07-06 DIAGNOSIS — E119 Type 2 diabetes mellitus without complications: Secondary | ICD-10-CM | POA: Diagnosis not present

## 2021-07-06 DIAGNOSIS — M7989 Other specified soft tissue disorders: Secondary | ICD-10-CM | POA: Diagnosis not present

## 2021-07-06 DIAGNOSIS — R079 Chest pain, unspecified: Secondary | ICD-10-CM | POA: Diagnosis not present

## 2021-07-06 DIAGNOSIS — Z87891 Personal history of nicotine dependence: Secondary | ICD-10-CM | POA: Insufficient documentation

## 2021-07-06 DIAGNOSIS — J939 Pneumothorax, unspecified: Secondary | ICD-10-CM | POA: Diagnosis not present

## 2021-07-06 DIAGNOSIS — F418 Other specified anxiety disorders: Secondary | ICD-10-CM | POA: Diagnosis not present

## 2021-07-06 DIAGNOSIS — J9811 Atelectasis: Secondary | ICD-10-CM | POA: Diagnosis not present

## 2021-07-06 DIAGNOSIS — Z883 Allergy status to other anti-infective agents status: Secondary | ICD-10-CM | POA: Diagnosis not present

## 2021-07-06 DIAGNOSIS — E782 Mixed hyperlipidemia: Secondary | ICD-10-CM | POA: Diagnosis not present

## 2021-07-06 DIAGNOSIS — Z9889 Other specified postprocedural states: Secondary | ICD-10-CM | POA: Diagnosis not present

## 2021-07-06 DIAGNOSIS — Z886 Allergy status to analgesic agent status: Secondary | ICD-10-CM | POA: Diagnosis not present

## 2021-07-06 DIAGNOSIS — D62 Acute posthemorrhagic anemia: Secondary | ICD-10-CM | POA: Diagnosis not present

## 2021-07-06 DIAGNOSIS — J90 Pleural effusion, not elsewhere classified: Secondary | ICD-10-CM | POA: Diagnosis not present

## 2021-07-06 DIAGNOSIS — J431 Panlobular emphysema: Secondary | ICD-10-CM | POA: Diagnosis not present

## 2021-07-06 DIAGNOSIS — I10 Essential (primary) hypertension: Secondary | ICD-10-CM | POA: Diagnosis not present

## 2021-07-06 DIAGNOSIS — Z79899 Other long term (current) drug therapy: Secondary | ICD-10-CM | POA: Diagnosis not present

## 2021-07-06 DIAGNOSIS — Z4682 Encounter for fitting and adjustment of non-vascular catheter: Secondary | ICD-10-CM | POA: Diagnosis not present

## 2021-07-06 DIAGNOSIS — E669 Obesity, unspecified: Secondary | ICD-10-CM | POA: Diagnosis not present

## 2021-07-06 DIAGNOSIS — E785 Hyperlipidemia, unspecified: Secondary | ICD-10-CM | POA: Diagnosis present

## 2021-07-06 DIAGNOSIS — K219 Gastro-esophageal reflux disease without esophagitis: Secondary | ICD-10-CM | POA: Diagnosis present

## 2021-07-06 DIAGNOSIS — R197 Diarrhea, unspecified: Secondary | ICD-10-CM | POA: Diagnosis not present

## 2021-07-06 DIAGNOSIS — Z809 Family history of malignant neoplasm, unspecified: Secondary | ICD-10-CM | POA: Diagnosis not present

## 2021-07-06 DIAGNOSIS — I2510 Atherosclerotic heart disease of native coronary artery without angina pectoris: Secondary | ICD-10-CM | POA: Diagnosis present

## 2021-07-06 DIAGNOSIS — F419 Anxiety disorder, unspecified: Secondary | ICD-10-CM | POA: Diagnosis present

## 2021-07-06 HISTORY — DX: Dyspnea, unspecified: R06.00

## 2021-07-06 LAB — COMPREHENSIVE METABOLIC PANEL
ALT: 19 U/L (ref 0–44)
AST: 31 U/L (ref 15–41)
Albumin: 3.5 g/dL (ref 3.5–5.0)
Alkaline Phosphatase: 62 U/L (ref 38–126)
Anion gap: 10 (ref 5–15)
BUN: 8 mg/dL (ref 8–23)
CO2: 24 mmol/L (ref 22–32)
Calcium: 9.1 mg/dL (ref 8.9–10.3)
Chloride: 103 mmol/L (ref 98–111)
Creatinine, Ser: 0.74 mg/dL (ref 0.44–1.00)
GFR, Estimated: >60 mL/min (ref >60)
Glucose, Bld: 151 mg/dL — ABNORMAL HIGH (ref 70–99)
Potassium: 3.4 mmol/L — ABNORMAL LOW (ref 3.5–5.1)
Sodium: 137 mmol/L (ref 135–145)
Total Bilirubin: 0.5 mg/dL (ref 0.3–1.2)
Total Protein: 7.7 g/dL (ref 6.5–8.1)

## 2021-07-06 LAB — URINALYSIS, ROUTINE W REFLEX MICROSCOPIC
Bacteria, UA: NONE SEEN
Bilirubin Urine: NEGATIVE
Glucose, UA: NEGATIVE mg/dL
Hgb urine dipstick: NEGATIVE
Ketones, ur: NEGATIVE mg/dL
Nitrite: NEGATIVE
Protein, ur: NEGATIVE mg/dL
Specific Gravity, Urine: 1.01 (ref 1.005–1.030)
pH: 5 (ref 5.0–8.0)

## 2021-07-06 LAB — SURGICAL PCR SCREEN
MRSA, PCR: NEGATIVE
Staphylococcus aureus: NEGATIVE

## 2021-07-06 LAB — CBC
HCT: 38.2 % (ref 36.0–46.0)
Hemoglobin: 12.5 g/dL (ref 12.0–15.0)
MCH: 29.8 pg (ref 26.0–34.0)
MCHC: 32.7 g/dL (ref 30.0–36.0)
MCV: 91 fL (ref 80.0–100.0)
Platelets: 224 10*3/uL (ref 150–400)
RBC: 4.2 MIL/uL (ref 3.87–5.11)
RDW: 13 % (ref 11.5–15.5)
WBC: 9.1 10*3/uL (ref 4.0–10.5)
nRBC: 0 % (ref 0.0–0.2)

## 2021-07-06 LAB — TYPE AND SCREEN
ABO/RH(D): O POS
Antibody Screen: NEGATIVE

## 2021-07-06 LAB — BLOOD GAS, ARTERIAL
Acid-Base Excess: 2 mmol/L (ref 0.0–2.0)
Bicarbonate: 26.1 mmol/L (ref 20.0–28.0)
Drawn by: 602861
FIO2: 21
O2 Saturation: 95.4 %
Patient temperature: 37
pCO2 arterial: 42 mmHg (ref 32.0–48.0)
pH, Arterial: 7.411 (ref 7.350–7.450)
pO2, Arterial: 76.6 mmHg — ABNORMAL LOW (ref 83.0–108.0)

## 2021-07-06 LAB — HEMOGLOBIN A1C
Hgb A1c MFr Bld: 7.5 % — ABNORMAL HIGH (ref 4.8–5.6)
Mean Plasma Glucose: 168.55 mg/dL

## 2021-07-06 LAB — SARS CORONAVIRUS 2 (TAT 6-24 HRS): SARS Coronavirus 2: NEGATIVE

## 2021-07-06 LAB — PROTIME-INR
INR: 1 (ref 0.8–1.2)
Prothrombin Time: 13.4 seconds (ref 11.4–15.2)

## 2021-07-06 LAB — GLUCOSE, CAPILLARY: Glucose-Capillary: 139 mg/dL — ABNORMAL HIGH (ref 70–99)

## 2021-07-06 LAB — APTT: aPTT: 27 seconds (ref 24–36)

## 2021-07-06 NOTE — Progress Notes (Signed)
PCP - Fulton Reek Cardiologist - denies  Chest x-ray - 07/06/21 EKG - 07/06/21 Stress Test - 07/01/21 ECHO - 05/04/21  DM - Type 2, does not check CBGs at home, per patient   Aspirin Instructions: Follow your surgeon's instructions on when to stop your ASA   COVID TEST- 07/06/21 (surgery admit)   Anesthesia review: n/a  Patient denies shortness of breath, fever, cough and chest pain at PAT appointment   All instructions explained to the patient, with a verbal understanding of the material. Patient agrees to go over the instructions while at home for a better understanding. Patient also instructed to self quarantine after being tested for COVID-19. The opportunity to ask questions was provided.

## 2021-07-07 ENCOUNTER — Encounter (HOSPITAL_COMMUNITY): Payer: Self-pay

## 2021-07-07 ENCOUNTER — Encounter (HOSPITAL_COMMUNITY): Payer: Self-pay | Admitting: Certified Registered Nurse Anesthetist

## 2021-07-08 ENCOUNTER — Inpatient Hospital Stay (HOSPITAL_COMMUNITY)
Admission: RE | Admit: 2021-07-08 | Discharge: 2021-07-14 | DRG: 164 | Disposition: A | Discharge status: Home / Self Care | Payer: Medicare HMO | Attending: Thoracic Surgery (Cardiothoracic Vascular Surgery) | Admitting: Thoracic Surgery (Cardiothoracic Vascular Surgery)

## 2021-07-08 ENCOUNTER — Encounter (HOSPITAL_COMMUNITY): Payer: Self-pay | Admitting: Thoracic Surgery (Cardiothoracic Vascular Surgery)

## 2021-07-08 ENCOUNTER — Encounter (HOSPITAL_COMMUNITY): Payer: Self-pay

## 2021-07-08 DIAGNOSIS — D491 Neoplasm of unspecified behavior of respiratory system: Secondary | ICD-10-CM | POA: Diagnosis present

## 2021-07-08 DIAGNOSIS — Z09 Encounter for follow-up examination after completed treatment for conditions other than malignant neoplasm: Secondary | ICD-10-CM

## 2021-07-08 DIAGNOSIS — R197 Diarrhea, unspecified: Secondary | ICD-10-CM | POA: Diagnosis not present

## 2021-07-08 DIAGNOSIS — F419 Anxiety disorder, unspecified: Secondary | ICD-10-CM | POA: Diagnosis present

## 2021-07-08 DIAGNOSIS — D62 Acute posthemorrhagic anemia: Secondary | ICD-10-CM | POA: Diagnosis present

## 2021-07-08 DIAGNOSIS — Z902 Acquired absence of lung [part of]: Secondary | ICD-10-CM

## 2021-07-08 DIAGNOSIS — Z883 Allergy status to other anti-infective agents status: Secondary | ICD-10-CM

## 2021-07-08 DIAGNOSIS — J90 Pleural effusion, not elsewhere classified: Secondary | ICD-10-CM | POA: Diagnosis not present

## 2021-07-08 DIAGNOSIS — E119 Type 2 diabetes mellitus without complications: Secondary | ICD-10-CM | POA: Diagnosis present

## 2021-07-08 DIAGNOSIS — Z7982 Long term (current) use of aspirin: Secondary | ICD-10-CM

## 2021-07-08 DIAGNOSIS — Z6839 Body mass index (BMI) 39.0-39.9, adult: Secondary | ICD-10-CM | POA: Diagnosis not present

## 2021-07-08 DIAGNOSIS — Z809 Family history of malignant neoplasm, unspecified: Secondary | ICD-10-CM | POA: Diagnosis not present

## 2021-07-08 DIAGNOSIS — F32A Depression, unspecified: Secondary | ICD-10-CM | POA: Diagnosis present

## 2021-07-08 DIAGNOSIS — J939 Pneumothorax, unspecified: Secondary | ICD-10-CM

## 2021-07-08 DIAGNOSIS — R079 Chest pain, unspecified: Secondary | ICD-10-CM | POA: Diagnosis not present

## 2021-07-08 DIAGNOSIS — E669 Obesity, unspecified: Secondary | ICD-10-CM | POA: Diagnosis present

## 2021-07-08 DIAGNOSIS — Z9889 Other specified postprocedural states: Secondary | ICD-10-CM

## 2021-07-08 DIAGNOSIS — I10 Essential (primary) hypertension: Secondary | ICD-10-CM | POA: Diagnosis present

## 2021-07-08 DIAGNOSIS — K219 Gastro-esophageal reflux disease without esophagitis: Secondary | ICD-10-CM | POA: Diagnosis present

## 2021-07-08 DIAGNOSIS — Z20822 Contact with and (suspected) exposure to covid-19: Secondary | ICD-10-CM | POA: Diagnosis present

## 2021-07-08 DIAGNOSIS — Z7951 Long term (current) use of inhaled steroids: Secondary | ICD-10-CM

## 2021-07-08 DIAGNOSIS — C3411 Malignant neoplasm of upper lobe, right bronchus or lung: Principal | ICD-10-CM | POA: Diagnosis present

## 2021-07-08 DIAGNOSIS — Z79899 Other long term (current) drug therapy: Secondary | ICD-10-CM | POA: Diagnosis not present

## 2021-07-08 DIAGNOSIS — E785 Hyperlipidemia, unspecified: Secondary | ICD-10-CM | POA: Diagnosis present

## 2021-07-08 DIAGNOSIS — Z981 Arthrodesis status: Secondary | ICD-10-CM

## 2021-07-08 DIAGNOSIS — I700 Atherosclerosis of aorta: Secondary | ICD-10-CM | POA: Diagnosis not present

## 2021-07-08 DIAGNOSIS — F418 Other specified anxiety disorders: Secondary | ICD-10-CM | POA: Diagnosis not present

## 2021-07-08 DIAGNOSIS — M7989 Other specified soft tissue disorders: Secondary | ICD-10-CM | POA: Diagnosis not present

## 2021-07-08 DIAGNOSIS — Z4682 Encounter for fitting and adjustment of non-vascular catheter: Secondary | ICD-10-CM

## 2021-07-08 DIAGNOSIS — R918 Other nonspecific abnormal finding of lung field: Secondary | ICD-10-CM | POA: Diagnosis not present

## 2021-07-08 DIAGNOSIS — Z87891 Personal history of nicotine dependence: Secondary | ICD-10-CM | POA: Diagnosis not present

## 2021-07-08 DIAGNOSIS — J9382 Other air leak: Secondary | ICD-10-CM | POA: Diagnosis present

## 2021-07-08 DIAGNOSIS — Z886 Allergy status to analgesic agent status: Secondary | ICD-10-CM

## 2021-07-08 DIAGNOSIS — J9811 Atelectasis: Secondary | ICD-10-CM | POA: Diagnosis not present

## 2021-07-08 DIAGNOSIS — I2510 Atherosclerotic heart disease of native coronary artery without angina pectoris: Secondary | ICD-10-CM | POA: Diagnosis present

## 2021-07-08 DIAGNOSIS — I517 Cardiomegaly: Secondary | ICD-10-CM | POA: Diagnosis not present

## 2021-07-08 DIAGNOSIS — C3490 Malignant neoplasm of unspecified part of unspecified bronchus or lung: Secondary | ICD-10-CM | POA: Diagnosis present

## 2021-07-08 DIAGNOSIS — J449 Chronic obstructive pulmonary disease, unspecified: Secondary | ICD-10-CM | POA: Diagnosis present

## 2021-07-08 LAB — CBC
HCT: 35.1 % — ABNORMAL LOW (ref 36.0–46.0)
Hemoglobin: 11.7 g/dL — ABNORMAL LOW (ref 12.0–15.0)
MCH: 29.8 pg (ref 26.0–34.0)
MCHC: 33.3 g/dL (ref 30.0–36.0)
MCV: 89.3 fL (ref 80.0–100.0)
Platelets: 193 10*3/uL (ref 150–400)
RBC: 3.93 MIL/uL (ref 3.87–5.11)
RDW: 13.2 % (ref 11.5–15.5)
WBC: 6.8 10*3/uL (ref 4.0–10.5)
nRBC: 0 % (ref 0.0–0.2)

## 2021-07-08 LAB — GLUCOSE, CAPILLARY: Glucose-Capillary: 186 mg/dL — ABNORMAL HIGH (ref 70–99)

## 2021-07-08 LAB — ABO/RH: ABO/RH(D): O POS

## 2021-07-08 LAB — CREATININE, SERUM
Creatinine, Ser: 0.75 mg/dL (ref 0.44–1.00)
GFR, Estimated: >60 mL/min (ref >60)

## 2021-07-08 MED ORDER — CEFAZOLIN SODIUM-DEXTROSE 2-4 GM/100ML-% IV SOLN
2.0000 g | INTRAVENOUS | Status: DC
  Filled 2021-07-08: qty 100

## 2021-07-08 MED ORDER — HYDRALAZINE HCL 20 MG/ML IJ SOLN: 10.0000 mg | Freq: Four times a day (QID) | INTRAMUSCULAR | Status: DC | PRN

## 2021-07-08 MED ORDER — CHLORHEXIDINE GLUCONATE 0.12 % MT SOLN
15.0000 mL | Freq: Once | OROMUCOSAL | Status: AC
Start: 2021-07-08 — End: 2021-07-08
  Administered 2021-07-08: 15 mL via OROMUCOSAL
  Filled 2021-07-08: qty 15

## 2021-07-08 MED ORDER — MIDAZOLAM HCL 2 MG/2ML IJ SOLN
INTRAMUSCULAR | Status: AC
  Filled 2021-07-08: qty 2

## 2021-07-08 MED ORDER — CLONAZEPAM 0.5 MG PO TABS
0.5000 mg | ORAL_TABLET | Freq: Three times a day (TID) | ORAL | Status: DC | PRN
  Administered 2021-07-08 – 2021-07-12 (×3): 0.5 mg via ORAL
  Filled 2021-07-08 (×3): qty 1

## 2021-07-08 MED ORDER — ENOXAPARIN SODIUM 40 MG/0.4ML IJ SOSY
40.0000 mg | PREFILLED_SYRINGE | INTRAMUSCULAR | Status: DC
  Administered 2021-07-08: 40 mg via SUBCUTANEOUS
  Filled 2021-07-08: qty 0.4

## 2021-07-08 MED ORDER — FENTANYL CITRATE (PF) 250 MCG/5ML IJ SOLN
INTRAMUSCULAR | Status: AC
  Filled 2021-07-08: qty 5

## 2021-07-08 MED ORDER — HYDROCODONE-ACETAMINOPHEN 10-325 MG PO TABS
1.0000 | ORAL_TABLET | Freq: Four times a day (QID) | ORAL | Status: DC | PRN
  Administered 2021-07-08 (×2): 1 via ORAL
  Filled 2021-07-08 (×2): qty 1

## 2021-07-08 MED ORDER — LACTATED RINGERS IV SOLN: INTRAVENOUS | Status: DC

## 2021-07-08 MED ORDER — ACETAMINOPHEN 325 MG PO TABS
650.0000 mg | ORAL_TABLET | Freq: Four times a day (QID) | ORAL | Status: DC | PRN
  Filled 2021-07-08: qty 2

## 2021-07-08 MED ORDER — PROPOFOL 10 MG/ML IV BOLUS
INTRAVENOUS | Status: AC
  Filled 2021-07-08: qty 20

## 2021-07-08 MED ORDER — ORAL CARE MOUTH RINSE: 15.0000 mL | Freq: Once | OROMUCOSAL | Status: AC

## 2021-07-08 NOTE — H&P (Signed)
[address]       ,[place] [address]             [phone]                                                   Khamila D Beam Buchanan [place] Medical Record #[id number] Date of Birth: 1950-10-19   Referring: Cammie Sickle, * Primary Care: Idelle Crouch, MD Primary Cardiologist: None    Brought in today for scheduled surgery, but due to 1st case, will admit patient for surgery tomorrow  Vitals:   07/08/21 1048  BP: 92/65  Pulse: 75  Resp: 17  Temp: 98.3 F (36.8 C)  SpO2: 95%   Alert NAD Sinus EWOB   RUL lung cancer  Olivia Buchanan   Per my last clinic note.   Chief Complaint:        Chief Complaint  Patient presents with   Lung Lesion      Surgical consult, Pet Scan 04/30/21, Chest CT 04/15/21, PFT's 05/06/21      History of Present Illness:    Olivia Buchanan 70 y.o. female referred for surgical evaluation of a right upper lobe pulmonary mass.  It is biopsy-proven to be a non-small cell lung cancer.  She also has enlarged tracheal, and subcarinal lymph nodes as well as a groundglass opacity with a mixed density in the left upper lobe.  She was originally thought to be a locally advanced lung cancer referred for chemotherapy and radiation but this was all done before the PET/CT was performed.  The PET/CT does show avidity within the right upper lobe mass but no significant avidity within the pretracheal lymph nodes or the left upper lobe groundglass opacity.  She did undergo biopsy of the right upper lobe mass EBUS with lymph node biopsy.  The right upper lobe biopsy was positive, and the lymph node biopsies were negative.     She stopped smoking about 3 years ago.  She has significant anxiety.  She does endorse some headaches.  She was unable to undergo an MRI brain due to claustrophobia, but the CT head that was performed was negative for metastatic disease.           Zubrod Score: At the time of surgery this patient's  most appropriate activity status/level should be described as: []     0    Normal activity, no symptoms [x]     1    Restricted in physical strenuous activity but ambulatory, able to do out light work []     2    Ambulatory and capable of self care, unable to do work activities, up and about               >50 % of waking hours                              []     3    Only limited self care, in bed greater than 50% of waking hours []     4    Completely disabled, no self care, confined to bed or chair []     5    Moribund         Past Medical History:  Diagnosis Date  Angioedema 12/08/2019   Anxiety     Aortic atherosclerosis (HCC)     Asthma     CAD (coronary artery disease)      3 vessel   Cancer (HCC)     COPD (chronic obstructive pulmonary disease) ([place]) 12/08/2019   DDD (degenerative disc disease), lumbar     Depression     Diabetes mellitus without complication (HCC)     GERD (gastroesophageal reflux disease)     HTN (hypertension) 12/08/2019   Hyperlipidemia     Hypertension     Mass of upper lobe of right lung 04/02/2021    spiculated mass RIGHT posterior pulmonary apex with associated hilar and pretracheal LAD; measured 3.2 x 2.8 cm   Obesity (BMI 30-39.9)     T2DM (type 2 diabetes mellitus) ([place]) 12/08/2019           Past Surgical History:  Procedure Laterality Date   BACK SURGERY        lumbar L5-S1 ruptured disc x 3 surgeries   CERVICAL FUSION   2009   LAPAROSCOPIC TOTAL HYSTERECTOMY   1998    with oophorectomy   TONSILLECTOMY       VIDEO BRONCHOSCOPY WITH ENDOBRONCHIAL NAVIGATION N/A 04/15/2021    Procedure: VIDEO BRONCHOSCOPY WITH ENDOBRONCHIAL NAVIGATION;  Surgeon: Ottie Glazier, MD;  Location: [facility] ORS;  Service: Thoracic;  Laterality: N/A;   VIDEO BRONCHOSCOPY WITH ENDOBRONCHIAL ULTRASOUND N/A 04/15/2021    Procedure: VIDEO BRONCHOSCOPY WITH ENDOBRONCHIAL ULTRASOUND;  Surgeon: Ottie Glazier, MD;  Location: [facility] ORS;  Service: Thoracic;  Laterality: N/A;            Family History  Problem Relation Age of Onset   Bone cancer Maternal Uncle          Social History        Tobacco Use  Smoking Status Former   Years: 50.00   Types: Cigarettes   Quit date: 2019   Years since quitting: 3.6  Smokeless Tobacco Never    Social History       Substance and Sexual Activity  Alcohol Use Not Currently            Allergies  Allergen Reactions   Ibuprofen Hives   Macrobid [Nitrofurantoin] Rash            Current Outpatient Medications  Medication Sig Dispense Refill   albuterol (VENTOLIN HFA) 108 (90 Base) MCG/ACT inhaler Inhale 1 puff into the lungs every 6 (six) hours as needed for wheezing.       Ascorbic Acid (VITAMIN C) 1000 MG tablet Take 1,000 mg by mouth in the morning and at bedtime.       aspirin EC 81 MG tablet Take 81 mg by mouth at bedtime.       bisoprolol-hydrochlorothiazide (ZIAC) 5-6.25 MG tablet Take 1 tablet by mouth daily.       clonazePAM (KLONOPIN) 0.5 MG tablet Take 0.5 mg by mouth 3 (three) times daily as needed for anxiety.       EPINEPHrine 0.3 mg/0.3 mL IJ SOAJ injection Inject 0.3 mLs (0.3 mg total) into the muscle as needed for anaphylaxis. 2 each 0   fluticasone-salmeterol (ADVAIR) 250-50 MCG/ACT AEPB Inhale 1 puff into the lungs in the morning and at bedtime.       gabapentin (NEURONTIN) 300 MG capsule Take 300 mg by mouth 3 (three) times daily.       hydrochlorothiazide (HYDRODIURIL) 25 MG tablet Take 25 mg by mouth daily.  HYDROcodone-acetaminophen (NORCO) 10-325 MG tablet Take 1 tablet by mouth 5 (five) times daily.       ipratropium-albuterol (DUONEB) 0.5-2.5 (3) MG/3ML SOLN Take 3 mLs by nebulization in the morning, at noon, in the evening, and at bedtime.       loratadine-pseudoephedrine (CLARITIN-D 24-HOUR) 10-240 MG 24 hr tablet Take 1 tablet by mouth daily.       Melatonin 10 MG TABS Take 10 mg by mouth at bedtime.       Multiple Vitamin (MULTI-VITAMIN) tablet Take 1 tablet by mouth daily.        nystatin cream (MYCOSTATIN) Apply 1 application topically 2 (two) times daily as needed for dry skin (around lips).       ondansetron (ZOFRAN) 8 MG tablet Take 1 tablet (8 mg total) by mouth every 8 (eight) hours as needed for nausea or vomiting. 20 tablet 1   pantoprazole (PROTONIX) 40 MG tablet Take 40 mg by mouth at bedtime.        potassium chloride SA (KLOR-CON) 20 MEQ tablet Take 20 mEq by mouth 2 (two) times daily.       pravastatin (PRAVACHOL) 80 MG tablet Take 80 mg by mouth at bedtime.        Probiotic Product (PROBIOTIC DAILY PO) Take 1 capsule by mouth daily.       tiZANidine (ZANAFLEX) 4 MG tablet Take 4 mg by mouth 3 (three) times daily.       venlafaxine XR (EFFEXOR-XR) 75 MG 24 hr capsule Take 150 mg by mouth at bedtime.        No current facility-administered medications for this visit.      Review of Systems  Constitutional:  Positive for malaise/fatigue.  Respiratory:  Positive for cough and shortness of breath.   Musculoskeletal:  Positive for myalgias.  Neurological:  Positive for weakness and headaches.  Psychiatric/Behavioral:  The patient is nervous/anxious.       PHYSICAL EXAMINATION: BP 133/74   Pulse 78   Resp 20   Ht 4\' 11"  (1.499 m)   Wt 194 lb (88 kg)   SpO2 92% Comment: RA  BMI 39.18 kg/m  Physical Exam Constitutional:      Appearance: Normal appearance. She is obese.  HENT:     Head: Normocephalic and atraumatic.  Eyes:     Extraocular Movements: Extraocular movements intact.  Cardiovascular:     Rate and Rhythm: Normal rate.  Pulmonary:     Effort: Pulmonary effort is normal. No respiratory distress.  Abdominal:     General: There is no distension.  Musculoskeletal:        General: Normal range of motion.     Cervical back: Normal range of motion.  Skin:    General: Skin is warm and dry.  Neurological:     General: No focal deficit present.     Mental Status: She is alert and oriented to person, place, and time.      Diagnostic  Studies & Laboratory data:     Recent Radiology Findings:    Imaging Results  CT Head W Wo Contrast   Result Date: 04/28/2021 CLINICAL DATA:  Non-small cell lung carcinoma. Staging. Left-sided headache. EXAM: CT HEAD WITHOUT AND WITH CONTRAST TECHNIQUE: Contiguous axial images were obtained from the base of the skull through the vertex without and with intravenous contrast CONTRAST:  68mL OMNIPAQUE IOHEXOL 350 MG/ML SOLN COMPARISON:  12/07/2019 FINDINGS: Brain: Mild age related volume loss. No focal abnormality seen affecting the brainstem or cerebellum.  Cerebral hemispheres are normal except for an old lacunar infarction in the left basal ganglia. No sign of mass lesion, hemorrhage, hydrocephalus or extra-axial collection no abnormal contrast enhancement occurs. Vascular: There is atherosclerotic calcification of the major vessels at the base of the brain. Skull: Negative Sinuses/Orbits: Clear/normal Other: None IMPRESSION: No evidence of metastatic disease. No acute finding. 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 These results will be called to the ordering clinician or representative by the Radiologist Assistant, and communication documented in the PACS or [employer]. Electronically Signed   By: Nelson Chimes M.D.   On: 04/28/2021 13:32    NM PET Image Initial (PI) Skull Base To Thigh   Result Date: 05/01/2021 CLINICAL DATA:  Initial treatment strategy for non-small cell lung cancer. EXAM: NUCLEAR MEDICINE PET SKULL BASE TO THIGH TECHNIQUE: 10.52 mCi F-18 FDG was injected intravenously. Full-ring PET imaging was performed from the skull base to thigh after the radiotracer. CT data was obtained and used for attenuation correction and anatomic localization. Fasting blood glucose: 149 mg/dl COMPARISON:  Chest CT 04/15/2021 FINDINGS: Mediastinal blood pool activity: SUV max 2.43 Liver activity: SUV max NA NECK: No hypermetabolic lymph nodes in the neck. Incidental CT findings: none CHEST: The 3 cm  right upper lobe lung mass is hypermetabolic with SUV max of 63.78 and consistent with known neoplasm. It abuts and likely involves the pleura. No obvious chest wall invasion. No findings suspicious for bone involvement. No hypermetabolic right hilar or mediastinal nodes to suggest metastatic adenopathy. The 12 mm pre carinal lymph node not show any hypermetabolism. SUV max is 2.45, similar to mediastinal background activity. No other hypermetabolic pulmonary lesions are identified. There are stable small ground-glass nodules in both lungs which will need close follow-up. No breast masses, supraclavicular or axillary adenopathy. Incidental CT findings: Stable atherosclerotic calcifications involving the aorta and coronary arteries. Stable borderline cardiac enlargement. ABDOMEN/PELVIS: No abnormal hypermetabolic activity within the liver, pancreas, adrenal glands, or spleen. No hypermetabolic lymph nodes in the abdomen or pelvis. Incidental CT findings: Atherosclerotic calcifications involving the aorta and branch vessels. 3.2 cm abdominal aortic aneurysm just above the bifurcation. Recommend follow-up ultrasound every 3 years. This recommendation follows ACR consensus guidelines: White Paper of the ACR Incidental Findings Committee II on Vascular Findings. J Am Coll Radiol 2013; 10:789-794. SKELETON: No findings suspicious for osseous metastatic disease. Incidental CT findings: none IMPRESSION: 1. Hypermetabolic 3 cm right upper lobe lung mass consistent with known primary neoplasm. This lesion abuts the pleura and there is likely pleural involvement but no definite chest wall invasion. 2. No hypermetabolic mediastinal or hilar adenopathy and no findings for metastatic disease involving the neck, abdomen/pelvis or bony structures. 3. Persistent ground-glass nodules in both lungs which will require continued surveillance. 4. 3.2 cm abdominal aortic aneurysm as detailed above. Electronically Signed   By: Marijo Sanes M.D.   On: 05/01/2021 14:43    ECHOCARDIOGRAM COMPLETE   Result Date: 05/04/2021    ECHOCARDIOGRAM REPORT   Patient Name:   MARCUS GROLL BEAM Buchanan Date of Exam: 05/04/2021 Medical Rec #:  [id number]          Height:       59.0 in Accession #:    [id number]         Weight:       194.6 lb Date of Birth:  November 03, 1950           BSA:          1.823 m Patient Age:  70 years           BP:           123/67 mmHg Patient Gender: F                  HR:           75 bpm. Exam Location:  [facility] Procedure: 2D Echo, Color Doppler, Cardiac Doppler and Strain Analysis Indications:     Shortness of breath on exertion R06.02; R22.43 Localized                  swelling of both lower legs  History:         Patient has no prior history of Echocardiogram examinations.                  CAD, COPD; Risk Factors:Diabetes, Dyslipidemia and                  Hypertension. Asthma.  Sonographer:     Charmayne Sheer RDCS (AE) Referring Phys:  [id number] Cammie Sickle Diagnosing Phys: Isaias Cowman MD  Sonographer Comments: Suboptimal parasternal window. Image acquisition challenging due to COPD. Global longitudinal strain was attempted. IMPRESSIONS  1. Left ventricular ejection fraction, by estimation, is 60 to 65%. The left ventricle has normal function. The left ventricle has no regional wall motion abnormalities. Left ventricular diastolic parameters are consistent with Grade I diastolic dysfunction (impaired relaxation).  2. Right ventricular systolic function is normal. The right ventricular size is normal.  3. The mitral valve is normal in structure. Mild mitral valve regurgitation. No evidence of mitral stenosis.  4. The aortic valve is normal in structure. Aortic valve regurgitation is not visualized. No aortic stenosis is present.  5. The inferior vena cava is normal in size with greater than 50% respiratory variability, suggesting right atrial pressure of 3 mmHg. FINDINGS  Left Ventricle: Left ventricular ejection fraction, by  estimation, is 60 to 65%. The left ventricle has normal function. The left ventricle has no regional wall motion abnormalities. The left ventricular internal cavity size was normal in size. There is  no left ventricular hypertrophy. Left ventricular diastolic parameters are consistent with Grade I diastolic dysfunction (impaired relaxation). Right Ventricle: The right ventricular size is normal. No increase in right ventricular wall thickness. Right ventricular systolic function is normal. Left Atrium: Left atrial size was normal in size. Right Atrium: Right atrial size was normal in size. Pericardium: There is no evidence of pericardial effusion. Mitral Valve: The mitral valve is normal in structure. Mild mitral valve regurgitation. No evidence of mitral valve stenosis. MV peak gradient, 3.7 mmHg. The mean mitral valve gradient is 1.0 mmHg. Tricuspid Valve: The tricuspid valve is normal in structure. Tricuspid valve regurgitation is mild . No evidence of tricuspid stenosis. Aortic Valve: The aortic valve is normal in structure. Aortic valve regurgitation is not visualized. No aortic stenosis is present. Aortic valve mean gradient measures 3.0 mmHg. Aortic valve peak gradient measures 6.5 mmHg. Aortic valve area, by VTI measures 2.04 cm. Pulmonic Valve: The pulmonic valve was normal in structure. Pulmonic valve regurgitation is not visualized. No evidence of pulmonic stenosis. Aorta: The aortic root is normal in size and structure. Venous: The inferior vena cava is normal in size with greater than 50% respiratory variability, suggesting right atrial pressure of 3 mmHg. IAS/Shunts: No atrial level shunt detected by color flow Doppler.  LEFT VENTRICLE PLAX 2D LVIDd:         3.90  cm     Diastology LVIDs:         3.40 cm     LV e' medial:    5.77 cm/s LV PW:         0.90 cm     LV E/e' medial:  11.2 LV IVS:        1.00 cm     LV e' lateral:   7.07 cm/s LVOT diam:     2.00 cm     LV E/e' lateral: 9.2 LV SV:         56  LV SV Index:   31 LVOT Area:     3.14 cm  LV Volumes (MOD) LV vol d, MOD A2C: 39.0 ml LV vol d, MOD A4C: 65.0 ml LV vol s, MOD A2C: 13.6 ml LV vol s, MOD A4C: 20.8 ml LV SV MOD A2C:     25.4 ml LV SV MOD A4C:     65.0 ml LV SV MOD BP:      34.6 ml RIGHT VENTRICLE RV Basal diam:  2.70 cm LEFT ATRIUM           Index       RIGHT ATRIUM           Index LA diam:      3.50 cm 1.92 cm/m  RA Area:     11.10 cm LA Vol (A2C): 22.0 ml 12.07 ml/m RA Volume:   23.90 ml  13.11 ml/m LA Vol (A4C): 36.4 ml 19.97 ml/m  AORTIC VALVE                   PULMONIC VALVE AV Area (Vmax):    2.12 cm    PV Vmax:       1.03 m/s AV Area (Vmean):   2.27 cm    PV Vmean:      79.200 cm/s AV Area (VTI):     2.04 cm    PV VTI:        0.270 m AV Vmax:           127.00 cm/s PV Peak grad:  4.2 mmHg AV Vmean:          81.500 cm/s PV Mean grad:  3.0 mmHg AV VTI:            0.275 m AV Peak Grad:      6.5 mmHg AV Mean Grad:      3.0 mmHg LVOT Vmax:         85.50 cm/s LVOT Vmean:        58.800 cm/s LVOT VTI:          0.179 m LVOT/AV VTI ratio: 0.65  AORTA Ao Root diam: 2.90 cm MITRAL VALVE               TRICUSPID VALVE MV Area (PHT): 3.21 cm    TR Peak grad:   28.7 mmHg MV Area VTI:   2.05 cm    TR Vmax:        268.00 cm/s MV Peak grad:  3.7 mmHg MV Mean grad:  1.0 mmHg    SHUNTS MV Vmax:       0.97 m/s    Systemic VTI:  0.18 m MV Vmean:      55.2 cm/s   Systemic Diam: 2.00 cm MV Decel Time: 236 msec MV E velocity: 64.70 cm/s MV A velocity: 77.10 cm/s MV E/A ratio:  0.84 Isaias Cowman MD Electronically signed by Isaias Cowman MD Signature  Date/Time: 05/04/2021/1:48:55 PM    Final            I have independently reviewed the above radiology studies  and reviewed the findings with the patient.    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  Negative EBUS with FNA Bilateral groundglass opacities.   Assessment / Plan:   70 year old female with biopsy-proven non-small cell lung cancer of the right upper lobe.  She also has enlarged subcarinal and pretracheal lymph nodes.  As well as bilateral groundglass opacities.  The lymph nodes do not appear avid on PET/CT but I am concerned that this may be metastatic disease.  Additionally she does have a concerning groundglass opacity in the left upper lobe.  I will refer this patient to Dr. Valeta Harms for reevaluation and potential robotic bronchoscopy and EBUS.  I explained to her and her husband that it is key to properly delineate whether or not she has N2 disease, because if she does she would not be a surgical candidate.  There is also concern that she may have synchronous primaries in the left upper lobe versus metastatic disease.  I have ordered pulmonary function testing since a DLCO was not performed and the studies earlier this month.

## 2021-07-08 NOTE — Anesthesia Preprocedure Evaluation (Deleted)
Anesthesia Evaluation    Reviewed: Allergy & Precautions, Patient's Chart, lab work & pertinent test results  Airway        Dental  (+) Edentulous Upper, Edentulous Lower   Pulmonary asthma , COPD,  COPD inhaler, former smoker,           Cardiovascular hypertension, Pt. on medications and Pt. on home beta blockers + CAD    9/28/20222 Impression No impression found.   Narrative . The study is normal. The study is low risk. . No ST deviation was noted. . LV perfusion is normal. There is no evidence of ischemia. There is no  evidence of infarction. . Left ventricular function is normal. Nuclear stress EF: 68 %. The left  ventricular ejection fraction is hyperdynamic (>65%). End diastolic cavity  size is normal. . Prior study not available for comparison.  Normal study without evidence of ischemia or infarction.  Normal LVEF, >65%. This is a low risk study.      Echo 05/04/2021 1. Left ventricular ejection fraction, by estimation, is 60 to 65%. The left ventricle has normal function. The left ventricle has no regional wall motion abnormalities. Left ventricular diastolic parameters are consistent with Grade I diastolic dysfunction (impaired relaxation).  2. Right ventricular systolic function is normal. The right ventricular size is normal.  3. The mitral valve is normal in structure. Mild mitral valve  regurgitation. No evidence of mitral stenosis.  4. The aortic valve is normal in structure. Aortic valve regurgitation is not visualized. No aortic stenosis is present.  5. The inferior vena cava is normal in size with greater than 50% respiratory variability, suggesting right atrial pressure of 3 mmHg.    Neuro/Psych PSYCHIATRIC DISORDERS Anxiety Depression negative neurological ROS     GI/Hepatic Neg liver ROS, GERD  ,  Endo/Other  diabetes  Renal/GU negative Renal ROS     Musculoskeletal  (+) Arthritis  ,   Abdominal (+) - obese,   Peds  Hematology negative hematology ROS (+)   Anesthesia Other Findings   Reproductive/Obstetrics                                                              Anesthesia Evaluation  Patient identified by MRN, date of birth, ID band Patient awake    Reviewed: Allergy & Precautions, NPO status , Patient's Chart, lab work & pertinent test results  Airway Mallampati: II  TM Distance: >3 FB Neck ROM: Full    Dental  (+) Edentulous Upper, Edentulous Lower   Pulmonary COPD,  COPD inhaler, former smoker,    Pulmonary exam normal        Cardiovascular hypertension, Pt. on medications + CAD  Normal cardiovascular exam     Neuro/Psych PSYCHIATRIC DISORDERS Anxiety Depression negative neurological ROS     GI/Hepatic Neg liver ROS, GERD  Medicated,  Endo/Other  diabetesMorbid obesity  Renal/GU negative Renal ROS  negative genitourinary   Musculoskeletal  (+) Arthritis , Osteoarthritis,    Abdominal   Peds negative pediatric ROS (+)  Hematology negative hematology ROS (+)   Anesthesia Other Findings Angioedema 12/08/2019   Anxiety    COPD (chronic obstructive pulmonary disease) ([place]) 12/08/2019  DDD (degenerative disc disease), lumbar Depression    Diabetes mellitus without complication (HCC)    GERD (gastroesophageal  reflux disease) HTN (hypertension) 12/08/2019  Hyperlipidemia    Hypertension    Obesity (BMI 30-39.9)    T2DM (type 2 diabetes mellitus) ([place]) 12/08/2019      Reproductive/Obstetrics negative OB ROS                            Anesthesia Physical Anesthesia Plan  ASA: 3  Anesthesia Plan: General   Post-op Pain Management:    Induction: Intravenous  PONV Risk Score and Plan: 2 and Propofol infusion and Midazolam  Airway Management Planned: Oral ETT  Additional Equipment:   Intra-op Plan:   Post-operative Plan: Extubation in OR  Informed  Consent: I have reviewed the patients History and Physical, chart, labs and discussed the procedure including the risks, benefits and alternatives for the proposed anesthesia with the patient or authorized representative who has indicated his/her understanding and acceptance.       Plan Discussed with: CRNA, Anesthesiologist and Surgeon  Anesthesia Plan Comments:         Anesthesia Quick Evaluation  Anesthesia Physical  Anesthesia Plan  ASA: 3  Anesthesia Plan: General   Post-op Pain Management:    Induction: Intravenous  PONV Risk Score and Plan: 3 and Ondansetron, Dexamethasone, Treatment may vary due to age or medical condition and Midazolam  Airway Management Planned: Double Lumen EBT  Additional Equipment: None  Intra-op Plan:   Post-operative Plan: Extubation in OR  Informed Consent:   Plan Discussed with: Anesthesiologist  Anesthesia Plan Comments:         Anesthesia Quick Evaluation

## 2021-07-08 NOTE — Progress Notes (Signed)
Called report to April RN on 2c.Verbalized understanding. Patient transferred to 2c room 2 in safe and stable condition.

## 2021-07-09 ENCOUNTER — Inpatient Hospital Stay (HOSPITAL_COMMUNITY): Payer: Medicare HMO | Admitting: Certified Registered"

## 2021-07-09 ENCOUNTER — Encounter (HOSPITAL_COMMUNITY): Payer: Self-pay | Admitting: Thoracic Surgery (Cardiothoracic Vascular Surgery)

## 2021-07-09 ENCOUNTER — Inpatient Hospital Stay: Admit: 2021-07-09 | Payer: Medicare HMO | Admitting: Thoracic Surgery (Cardiothoracic Vascular Surgery)

## 2021-07-09 ENCOUNTER — Encounter (HOSPITAL_COMMUNITY)
Admission: RE | Disposition: A | Discharge status: Home / Self Care | Payer: Self-pay | Source: Home / Self Care | Attending: Thoracic Surgery (Cardiothoracic Vascular Surgery)

## 2021-07-09 ENCOUNTER — Inpatient Hospital Stay (HOSPITAL_COMMUNITY): Payer: Medicare HMO

## 2021-07-09 DIAGNOSIS — F418 Other specified anxiety disorders: Secondary | ICD-10-CM | POA: Diagnosis not present

## 2021-07-09 DIAGNOSIS — J939 Pneumothorax, unspecified: Secondary | ICD-10-CM | POA: Diagnosis not present

## 2021-07-09 DIAGNOSIS — E785 Hyperlipidemia, unspecified: Secondary | ICD-10-CM | POA: Diagnosis not present

## 2021-07-09 DIAGNOSIS — I700 Atherosclerosis of aorta: Secondary | ICD-10-CM | POA: Diagnosis not present

## 2021-07-09 DIAGNOSIS — C3411 Malignant neoplasm of upper lobe, right bronchus or lung: Secondary | ICD-10-CM | POA: Diagnosis not present

## 2021-07-09 DIAGNOSIS — D491 Neoplasm of unspecified behavior of respiratory system: Secondary | ICD-10-CM | POA: Diagnosis present

## 2021-07-09 HISTORY — PX: LYMPH NODE DISSECTION: SHX5087

## 2021-07-09 HISTORY — PX: INTERCOSTAL NERVE BLOCK: SHX5021

## 2021-07-09 LAB — GLUCOSE, CAPILLARY
Glucose-Capillary: 158 mg/dL — ABNORMAL HIGH (ref 70–99)
Glucose-Capillary: 231 mg/dL — ABNORMAL HIGH (ref 70–99)
Glucose-Capillary: 253 mg/dL — ABNORMAL HIGH (ref 70–99)
Glucose-Capillary: 310 mg/dL — ABNORMAL HIGH (ref 70–99)

## 2021-07-09 SURGERY — LOBECTOMY, LUNG, ROBOT-ASSISTED, USING VATS
Anesthesia: General | Site: Chest | Laterality: Right

## 2021-07-09 MED ORDER — PROPOFOL 10 MG/ML IV BOLUS
INTRAVENOUS | Status: DC | PRN
  Administered 2021-07-09: 50 mg via INTRAVENOUS
  Administered 2021-07-09: 130 mg via INTRAVENOUS

## 2021-07-09 MED ORDER — 0.9 % SODIUM CHLORIDE (POUR BTL) OPTIME
TOPICAL | Status: DC | PRN
  Administered 2021-07-09: 2000 mL

## 2021-07-09 MED ORDER — SUFENTANIL CITRATE 50 MCG/ML IV SOLN
INTRAVENOUS | Status: DC | PRN
  Administered 2021-07-09 (×5): 10 ug via INTRAVENOUS

## 2021-07-09 MED ORDER — PHENYLEPHRINE HCL-NACL 20-0.9 MG/250ML-% IV SOLN
INTRAVENOUS | Status: DC | PRN
  Administered 2021-07-09: 50 ug/min via INTRAVENOUS

## 2021-07-09 MED ORDER — GABAPENTIN 300 MG PO CAPS
300.0000 mg | ORAL_CAPSULE | Freq: Three times a day (TID) | ORAL | Status: DC
  Administered 2021-07-09 – 2021-07-14 (×14): 300 mg via ORAL
  Filled 2021-07-09 (×4): qty 1
  Filled 2021-07-09: qty 3
  Filled 2021-07-09 (×9): qty 1

## 2021-07-09 MED ORDER — FENTANYL CITRATE (PF) 100 MCG/2ML IJ SOLN: 25.0000 ug | INTRAMUSCULAR | Status: DC | PRN

## 2021-07-09 MED ORDER — OXYCODONE HCL 5 MG PO TABS: 5.0000 mg | ORAL_TABLET | Freq: Once | ORAL | Status: DC | PRN

## 2021-07-09 MED ORDER — BISACODYL 5 MG PO TBEC
10.0000 mg | DELAYED_RELEASE_TABLET | Freq: Every day | ORAL | Status: DC
  Administered 2021-07-10: 10 mg via ORAL
  Filled 2021-07-09: qty 2

## 2021-07-09 MED ORDER — SCOPOLAMINE 1 MG/3DAYS TD PT72: 1.0000 | MEDICATED_PATCH | TRANSDERMAL | Status: DC

## 2021-07-09 MED ORDER — DEXAMETHASONE SODIUM PHOSPHATE 10 MG/ML IJ SOLN
INTRAMUSCULAR | Status: AC
  Filled 2021-07-09: qty 1

## 2021-07-09 MED ORDER — ACETAMINOPHEN 500 MG PO TABS
1000.0000 mg | ORAL_TABLET | Freq: Four times a day (QID) | ORAL | Status: DC
Start: 2021-07-09 — End: 2021-07-14
  Administered 2021-07-09 – 2021-07-14 (×17): 1000 mg via ORAL
  Filled 2021-07-09 (×18): qty 2

## 2021-07-09 MED ORDER — MOMETASONE FURO-FORMOTEROL FUM 200-5 MCG/ACT IN AERO
2.0000 | INHALATION_SPRAY | Freq: Two times a day (BID) | RESPIRATORY_TRACT | Status: DC
  Administered 2021-07-09 – 2021-07-14 (×10): 2 via RESPIRATORY_TRACT
  Filled 2021-07-09: qty 8.8

## 2021-07-09 MED ORDER — DOCUSATE SODIUM 100 MG PO CAPS
200.0000 mg | ORAL_CAPSULE | Freq: Every day | ORAL | Status: DC
  Administered 2021-07-09: 200 mg via ORAL
  Filled 2021-07-09: qty 2

## 2021-07-09 MED ORDER — CHLORHEXIDINE GLUCONATE 0.12 % MT SOLN: 15.0000 mL | Freq: Once | OROMUCOSAL | Status: AC

## 2021-07-09 MED ORDER — MIDAZOLAM HCL 2 MG/2ML IJ SOLN
INTRAMUSCULAR | Status: DC | PRN
Start: 2021-07-09 — End: 2021-07-09
  Administered 2021-07-09: 2 mg via INTRAVENOUS

## 2021-07-09 MED ORDER — ACETAMINOPHEN 160 MG/5ML PO SOLN: 1000.0000 mg | Freq: Four times a day (QID) | ORAL | Status: DC

## 2021-07-09 MED ORDER — PRAVASTATIN SODIUM 40 MG PO TABS
80.0000 mg | ORAL_TABLET | Freq: Every day | ORAL | Status: DC
  Administered 2021-07-09 – 2021-07-13 (×5): 80 mg via ORAL
  Filled 2021-07-09 (×5): qty 2

## 2021-07-09 MED ORDER — SODIUM CHLORIDE (PF) 0.9 % IJ SOLN
INTRAMUSCULAR | Status: AC
  Filled 2021-07-09: qty 10

## 2021-07-09 MED ORDER — KETOROLAC TROMETHAMINE 15 MG/ML IJ SOLN
30.0000 mg | Freq: Four times a day (QID) | INTRAMUSCULAR | Status: AC
  Administered 2021-07-09 – 2021-07-11 (×8): 30 mg via INTRAVENOUS
  Filled 2021-07-09 (×8): qty 2

## 2021-07-09 MED ORDER — PANTOPRAZOLE SODIUM 40 MG PO TBEC
40.0000 mg | DELAYED_RELEASE_TABLET | Freq: Every day | ORAL | Status: DC
  Administered 2021-07-09 – 2021-07-13 (×5): 40 mg via ORAL
  Filled 2021-07-09 (×5): qty 1

## 2021-07-09 MED ORDER — CEFAZOLIN SODIUM-DEXTROSE 2-4 GM/100ML-% IV SOLN: 2.0000 g | INTRAVENOUS | Status: DC

## 2021-07-09 MED ORDER — LIDOCAINE 2% (20 MG/ML) 5 ML SYRINGE
INTRAMUSCULAR | Status: AC
  Filled 2021-07-09: qty 5

## 2021-07-09 MED ORDER — INSULIN ASPART 100 UNIT/ML IJ SOLN
0.0000 [IU] | Freq: Four times a day (QID) | INTRAMUSCULAR | Status: DC
  Administered 2021-07-09: 12 [IU] via SUBCUTANEOUS
  Administered 2021-07-10: 2 [IU] via SUBCUTANEOUS
  Administered 2021-07-10: 8 [IU] via SUBCUTANEOUS
  Administered 2021-07-10: 2 [IU] via SUBCUTANEOUS
  Administered 2021-07-10: 16 [IU] via SUBCUTANEOUS
  Administered 2021-07-10 – 2021-07-11 (×2): 2 [IU] via SUBCUTANEOUS

## 2021-07-09 MED ORDER — PROBIOTIC DAILY PO CAPS: 1.0000 | ORAL_CAPSULE | Freq: Every day | ORAL | Status: DC

## 2021-07-09 MED ORDER — SCOPOLAMINE 1 MG/3DAYS TD PT72
MEDICATED_PATCH | TRANSDERMAL | Status: AC
  Administered 2021-07-09: 1.5 mg via TRANSDERMAL
  Filled 2021-07-09: qty 1

## 2021-07-09 MED ORDER — AMISULPRIDE (ANTIEMETIC) 5 MG/2ML IV SOLN: 10.0000 mg | Freq: Once | INTRAVENOUS | Status: DC | PRN

## 2021-07-09 MED ORDER — IPRATROPIUM-ALBUTEROL 0.5-2.5 (3) MG/3ML IN SOLN
3.0000 mL | Freq: Three times a day (TID) | RESPIRATORY_TRACT | Status: DC
  Administered 2021-07-09 – 2021-07-12 (×8): 3 mL via RESPIRATORY_TRACT
  Filled 2021-07-09 (×8): qty 3

## 2021-07-09 MED ORDER — CEFAZOLIN SODIUM-DEXTROSE 2-4 GM/100ML-% IV SOLN
INTRAVENOUS | Status: AC
  Filled 2021-07-09: qty 100

## 2021-07-09 MED ORDER — BUPIVACAINE LIPOSOME 1.3 % IJ SUSP
INTRAMUSCULAR | Status: AC
  Filled 2021-07-09: qty 20

## 2021-07-09 MED ORDER — LEVALBUTEROL HCL 0.63 MG/3ML IN NEBU: 0.6300 mg | INHALATION_SOLUTION | Freq: Three times a day (TID) | RESPIRATORY_TRACT | Status: DC | PRN

## 2021-07-09 MED ORDER — SUFENTANIL CITRATE 50 MCG/ML IV SOLN
INTRAVENOUS | Status: AC
  Filled 2021-07-09: qty 1

## 2021-07-09 MED ORDER — RISAQUAD PO CAPS
1.0000 | ORAL_CAPSULE | Freq: Every day | ORAL | Status: DC
  Administered 2021-07-10: 1 via ORAL
  Filled 2021-07-09 (×2): qty 1

## 2021-07-09 MED ORDER — ONDANSETRON HCL 4 MG/2ML IJ SOLN
4.0000 mg | Freq: Four times a day (QID) | INTRAMUSCULAR | Status: DC | PRN
  Administered 2021-07-10: 4 mg via INTRAVENOUS
  Filled 2021-07-09: qty 2

## 2021-07-09 MED ORDER — ONDANSETRON HCL 4 MG/2ML IJ SOLN
INTRAMUSCULAR | Status: AC
  Filled 2021-07-09: qty 2

## 2021-07-09 MED ORDER — LIDOCAINE HCL (CARDIAC) PF 100 MG/5ML IV SOSY
PREFILLED_SYRINGE | INTRAVENOUS | Status: DC | PRN
Start: 2021-07-09 — End: 2021-07-09
  Administered 2021-07-09: 80 mg via INTRAVENOUS

## 2021-07-09 MED ORDER — ASPIRIN EC 81 MG PO TBEC
81.0000 mg | DELAYED_RELEASE_TABLET | Freq: Every day | ORAL | Status: DC
  Administered 2021-07-10 – 2021-07-13 (×4): 81 mg via ORAL
  Filled 2021-07-09 (×4): qty 1

## 2021-07-09 MED ORDER — OXYCODONE HCL 5 MG/5ML PO SOLN: 5.0000 mg | Freq: Once | ORAL | Status: DC | PRN

## 2021-07-09 MED ORDER — MELATONIN 5 MG PO TABS
10.0000 mg | ORAL_TABLET | Freq: Every day | ORAL | Status: DC
  Administered 2021-07-09 – 2021-07-13 (×5): 10 mg via ORAL
  Filled 2021-07-09 (×5): qty 2

## 2021-07-09 MED ORDER — ADULT MULTIVITAMIN W/MINERALS CH
1.0000 | ORAL_TABLET | Freq: Every day | ORAL | Status: DC
  Administered 2021-07-09 – 2021-07-13 (×5): 1 via ORAL
  Filled 2021-07-09 (×5): qty 1

## 2021-07-09 MED ORDER — ROCURONIUM 10MG/ML (10ML) SYRINGE FOR MEDFUSION PUMP - OPTIME
INTRAVENOUS | Status: DC | PRN
  Administered 2021-07-09: 100 mg via INTRAVENOUS

## 2021-07-09 MED ORDER — ACETAMINOPHEN 160 MG/5ML PO SOLN: 325.0000 mg | ORAL | Status: DC | PRN

## 2021-07-09 MED ORDER — KETOROLAC TROMETHAMINE 30 MG/ML IJ SOLN
INTRAMUSCULAR | Status: DC | PRN
  Administered 2021-07-09: 30 mg via INTRAVENOUS

## 2021-07-09 MED ORDER — ONDANSETRON HCL 4 MG/2ML IJ SOLN
INTRAMUSCULAR | Status: DC | PRN
Start: 2021-07-09 — End: 2021-07-09
  Administered 2021-07-09: 4 mg via INTRAVENOUS

## 2021-07-09 MED ORDER — INSULIN ASPART 100 UNIT/ML IJ SOLN
5.0000 [IU] | Freq: Once | INTRAMUSCULAR | Status: AC
  Administered 2021-07-09: 5 [IU] via SUBCUTANEOUS

## 2021-07-09 MED ORDER — MIDAZOLAM HCL 2 MG/2ML IJ SOLN
INTRAMUSCULAR | Status: AC
  Filled 2021-07-09: qty 2

## 2021-07-09 MED ORDER — LORATADINE 10 MG PO TABS
10.0000 mg | ORAL_TABLET | Freq: Every day | ORAL | Status: DC
  Administered 2021-07-10 – 2021-07-14 (×5): 10 mg via ORAL
  Filled 2021-07-09 (×5): qty 1

## 2021-07-09 MED ORDER — LACTATED RINGERS IV SOLN: INTRAVENOUS | Status: DC

## 2021-07-09 MED ORDER — CEFAZOLIN SODIUM-DEXTROSE 2-4 GM/100ML-% IV SOLN
2.0000 g | Freq: Three times a day (TID) | INTRAVENOUS | Status: AC
Start: 2021-07-09 — End: 2021-07-10
  Administered 2021-07-09: 2 g via INTRAVENOUS
  Filled 2021-07-09 (×2): qty 100

## 2021-07-09 MED ORDER — SUGAMMADEX SODIUM 200 MG/2ML IV SOLN
INTRAVENOUS | Status: DC | PRN
Start: 2021-07-09 — End: 2021-07-09
  Administered 2021-07-09: 200 mg via INTRAVENOUS

## 2021-07-09 MED ORDER — PROPOFOL 10 MG/ML IV BOLUS
INTRAVENOUS | Status: AC
  Filled 2021-07-09: qty 20

## 2021-07-09 MED ORDER — DEXAMETHASONE SODIUM PHOSPHATE 10 MG/ML IJ SOLN
INTRAMUSCULAR | Status: DC | PRN
  Administered 2021-07-09: 10 mg via INTRAVENOUS

## 2021-07-09 MED ORDER — SODIUM CHLORIDE FLUSH 0.9 % IV SOLN
INTRAVENOUS | Status: DC | PRN
  Administered 2021-07-09: 100 mL

## 2021-07-09 MED ORDER — ACETAMINOPHEN 10 MG/ML IV SOLN: 1000.0000 mg | Freq: Once | INTRAVENOUS | Status: DC | PRN

## 2021-07-09 MED ORDER — ROCURONIUM BROMIDE 10 MG/ML (PF) SYRINGE
PREFILLED_SYRINGE | INTRAVENOUS | Status: AC
  Filled 2021-07-09: qty 10

## 2021-07-09 MED ORDER — INSULIN ASPART 100 UNIT/ML IJ SOLN
INTRAMUSCULAR | Status: AC
  Filled 2021-07-09: qty 1

## 2021-07-09 MED ORDER — LACTATED RINGERS IV SOLN: INTRAVENOUS | Status: DC | PRN

## 2021-07-09 MED ORDER — CHLORHEXIDINE GLUCONATE 0.12 % MT SOLN
OROMUCOSAL | Status: AC
  Administered 2021-07-09: 15 mL via OROMUCOSAL
  Filled 2021-07-09: qty 15

## 2021-07-09 MED ORDER — TIZANIDINE HCL 4 MG PO TABS
4.0000 mg | ORAL_TABLET | Freq: Three times a day (TID) | ORAL | Status: DC
  Administered 2021-07-10 – 2021-07-14 (×13): 4 mg via ORAL
  Filled 2021-07-09 (×13): qty 1

## 2021-07-09 MED ORDER — MORPHINE SULFATE (PF) 2 MG/ML IV SOLN
1.0000 mg | INTRAVENOUS | Status: DC | PRN
  Administered 2021-07-09 – 2021-07-11 (×6): 2 mg via INTRAVENOUS
  Administered 2021-07-11: 1 mg via INTRAVENOUS
  Administered 2021-07-11 – 2021-07-13 (×11): 2 mg via INTRAVENOUS
  Filled 2021-07-09 (×19): qty 1

## 2021-07-09 MED ORDER — ORAL CARE MOUTH RINSE: 15.0000 mL | Freq: Once | OROMUCOSAL | Status: AC

## 2021-07-09 MED ORDER — BUPIVACAINE HCL (PF) 0.5 % IJ SOLN
INTRAMUSCULAR | Status: AC
  Filled 2021-07-09: qty 30

## 2021-07-09 MED ORDER — ENOXAPARIN SODIUM 40 MG/0.4ML IJ SOSY
40.0000 mg | PREFILLED_SYRINGE | INTRAMUSCULAR | Status: DC
  Administered 2021-07-10 – 2021-07-13 (×4): 40 mg via SUBCUTANEOUS
  Filled 2021-07-09 (×4): qty 0.4

## 2021-07-09 MED ORDER — ACETAMINOPHEN 325 MG PO TABS: 325.0000 mg | ORAL_TABLET | ORAL | Status: DC | PRN

## 2021-07-09 MED ORDER — PROMETHAZINE HCL 25 MG/ML IJ SOLN: 6.2500 mg | INTRAMUSCULAR | Status: DC | PRN

## 2021-07-09 MED ORDER — VENLAFAXINE HCL ER 150 MG PO CP24
150.0000 mg | ORAL_CAPSULE | Freq: Every day | ORAL | Status: DC
  Administered 2021-07-09 – 2021-07-13 (×5): 150 mg via ORAL
  Filled 2021-07-09 (×6): qty 1

## 2021-07-09 SURGICAL SUPPLY — 114 items
APPLIER CLIP 5 13 M/L LIGAMAX5 (MISCELLANEOUS) ×2
BAG LAPAROSCOPIC 12 15 PORT 16 (BASKET) ×1 IMPLANT
BAG RETRIEVAL 12/15 (BASKET) ×2
BLADE CLIPPER SURG (BLADE) IMPLANT
BLADE SURG 11 STRL SS (BLADE) ×2 IMPLANT
BNDG COHESIVE 6X5 TAN STRL LF (GAUZE/BANDAGES/DRESSINGS) IMPLANT
CANISTER SUCT 3000ML PPV (MISCELLANEOUS) ×4 IMPLANT
CANNULA REDUC XI 12-8 STAPL (CANNULA) ×3
CANNULA REDUCER 12-8 DVNC XI (CANNULA) ×3 IMPLANT
CATH THORACIC 28FR (CATHETERS) ×2 IMPLANT
CATH TROCAR 20FR (CATHETERS) IMPLANT
CHLORAPREP W/TINT 26 (MISCELLANEOUS) ×2 IMPLANT
CLIP APPLIE 5 13 M/L LIGAMAX5 (MISCELLANEOUS) ×1 IMPLANT
CLIP VESOCCLUDE MED 6/CT (CLIP) IMPLANT
CNTNR URN SCR LID CUP LEK RST (MISCELLANEOUS) ×8 IMPLANT
CONN ST 1/4X3/8  BEN (MISCELLANEOUS)
CONN ST 1/4X3/8 BEN (MISCELLANEOUS) IMPLANT
CONT SPEC 4OZ STRL OR WHT (MISCELLANEOUS) ×8
DEFOGGER SCOPE WARMER CLEARIFY (MISCELLANEOUS) ×2 IMPLANT
DERMABOND ADVANCED (GAUZE/BANDAGES/DRESSINGS) ×1
DERMABOND ADVANCED .7 DNX12 (GAUZE/BANDAGES/DRESSINGS) ×1 IMPLANT
DISSECTOR BLUNT TIP ENDO 5MM (MISCELLANEOUS) IMPLANT
DRAIN CHANNEL 28F RND 3/8 FF (WOUND CARE) IMPLANT
DRAPE ARM DVNC X/XI (DISPOSABLE) ×4 IMPLANT
DRAPE COLUMN DVNC XI (DISPOSABLE) ×1 IMPLANT
DRAPE CV SPLIT W-CLR ANES SCRN (DRAPES) ×2 IMPLANT
DRAPE DA VINCI XI ARM (DISPOSABLE) ×4
DRAPE DA VINCI XI COLUMN (DISPOSABLE) ×1
DRAPE HALF SHEET 40X57 (DRAPES) ×2 IMPLANT
DRAPE ORTHO SPLIT 77X108 STRL (DRAPES) ×1
DRAPE SURG ORHT 6 SPLT 77X108 (DRAPES) ×1 IMPLANT
ELECT BLADE 6.5 EXT (BLADE) IMPLANT
ELECT REM PT RETURN 9FT ADLT (ELECTROSURGICAL) ×2
ELECTRODE REM PT RTRN 9FT ADLT (ELECTROSURGICAL) ×1 IMPLANT
GAUZE KITTNER 4X8 (MISCELLANEOUS) ×4 IMPLANT
GAUZE SPONGE 4X4 12PLY STRL (GAUZE/BANDAGES/DRESSINGS) ×2 IMPLANT
GLOVE SURG ENC MOIS LTX SZ7.5 (GLOVE) ×4 IMPLANT
GLOVE SURG MICRO LTX SZ6 (GLOVE) ×2 IMPLANT
GLOVE SURG POLYISO LF SZ8 (GLOVE) ×2 IMPLANT
GOWN STRL REUS W/ TWL LRG LVL3 (GOWN DISPOSABLE) ×3 IMPLANT
GOWN STRL REUS W/ TWL XL LVL3 (GOWN DISPOSABLE) ×2 IMPLANT
GOWN STRL REUS W/TWL 2XL LVL3 (GOWN DISPOSABLE) ×2 IMPLANT
GOWN STRL REUS W/TWL LRG LVL3 (GOWN DISPOSABLE) ×3
GOWN STRL REUS W/TWL XL LVL3 (GOWN DISPOSABLE) ×2
HEMOSTAT SURGICEL 2X14 (HEMOSTASIS) ×2 IMPLANT
IRRIGATION STRYKERFLOW (MISCELLANEOUS) IMPLANT
IRRIGATOR STRYKERFLOW (MISCELLANEOUS)
IRRIGATOR SUCT 8 DISP DVNC XI (IRRIGATION / IRRIGATOR) ×1 IMPLANT
IRRIGATOR SUCTION 8MM XI DISP (IRRIGATION / IRRIGATOR) ×1
KIT BASIN OR (CUSTOM PROCEDURE TRAY) ×2 IMPLANT
KIT TURNOVER KIT B (KITS) ×2 IMPLANT
NEEDLE 22X1 1/2 (OR ONLY) (NEEDLE) ×2 IMPLANT
NS IRRIG 1000ML POUR BTL (IV SOLUTION) ×4 IMPLANT
PACK CHEST (CUSTOM PROCEDURE TRAY) ×2 IMPLANT
PAD ARMBOARD 7.5X6 YLW CONV (MISCELLANEOUS) ×10 IMPLANT
PORT ACCESS TROCAR AIRSEAL 12 (TROCAR) ×1 IMPLANT
PORT ACCESS TROCAR AIRSEAL 5M (TROCAR) ×1
POUCH ENDO CATCH II 15MM (MISCELLANEOUS) IMPLANT
RELOAD STAPLER 2.5X45 WHT DVNC (STAPLE) ×3 IMPLANT
RELOAD STAPLER 3.5X45 BLU DVNC (STAPLE) ×5 IMPLANT
RELOAD STAPLER 4.3X45 GRN DVNC (STAPLE) ×2 IMPLANT
RETRACTOR WOUND ALXS 19CM XSML (INSTRUMENTS) IMPLANT
RTRCTR WOUND ALEXIS 19CM XSML (INSTRUMENTS)
SCISSORS LAP 5X35 DISP (ENDOMECHANICALS) IMPLANT
SEAL CANN UNIV 5-8 DVNC XI (MISCELLANEOUS) ×2 IMPLANT
SEAL XI 5MM-8MM UNIVERSAL (MISCELLANEOUS) ×2
SEALANT PROGEL (MISCELLANEOUS) IMPLANT
SEALER LIGASURE MARYLAND 30 (ELECTROSURGICAL) IMPLANT
SET TRI-LUMEN FLTR TB AIRSEAL (TUBING) ×2 IMPLANT
SOLUTION ELECTROLUBE (MISCELLANEOUS) ×2 IMPLANT
SPONGE INTESTINAL PEANUT (DISPOSABLE) IMPLANT
SPONGE TONSIL TAPE 1 RFD (DISPOSABLE) IMPLANT
STAPLE RELOAD 45 2.0 GRAY (STAPLE) ×1
STAPLE RELOAD 45 2.0 GRAY DVNC (STAPLE) ×1 IMPLANT
STAPLER 45 SUREFORM CVD (STAPLE) ×1
STAPLER 45 SUREFORM CVD DVNC (STAPLE) ×1 IMPLANT
STAPLER CANNULA SEAL DVNC XI (STAPLE) ×2 IMPLANT
STAPLER CANNULA SEAL XI (STAPLE) ×2
STAPLER RELOAD 2.5X45 WHITE (STAPLE) ×3
STAPLER RELOAD 2.5X45 WHT DVNC (STAPLE) ×3
STAPLER RELOAD 3.5X45 BLU DVNC (STAPLE) ×5
STAPLER RELOAD 3.5X45 BLUE (STAPLE) ×5
STAPLER RELOAD 4.3X45 GREEN (STAPLE) ×2
STAPLER RELOAD 4.3X45 GRN DVNC (STAPLE) ×2
STOPCOCK 4 WAY LG BORE MALE ST (IV SETS) ×2 IMPLANT
SUT MNCRL AB 3-0 PS2 18 (SUTURE) IMPLANT
SUT MON AB 2-0 CT1 36 (SUTURE) IMPLANT
SUT PDS AB 1 CTX 36 (SUTURE) IMPLANT
SUT PROLENE 4 0 RB 1 (SUTURE)
SUT PROLENE 4-0 RB1 .5 CRCL 36 (SUTURE) IMPLANT
SUT SILK  1 MH (SUTURE) ×1
SUT SILK 1 MH (SUTURE) ×1 IMPLANT
SUT SILK 1 TIES 10X30 (SUTURE) IMPLANT
SUT SILK 2 0 SH (SUTURE) IMPLANT
SUT SILK 2 0SH CR/8 30 (SUTURE) IMPLANT
SUT VIC AB 1 CTX 36 (SUTURE)
SUT VIC AB 1 CTX36XBRD ANBCTR (SUTURE) IMPLANT
SUT VIC AB 2-0 CT1 27 (SUTURE) ×1
SUT VIC AB 2-0 CT1 TAPERPNT 27 (SUTURE) ×1 IMPLANT
SUT VIC AB 3-0 SH 27 (SUTURE) ×4
SUT VIC AB 3-0 SH 27X BRD (SUTURE) ×4 IMPLANT
SUT VICRYL 0 TIES 12 18 (SUTURE) ×2 IMPLANT
SUT VICRYL 0 UR6 27IN ABS (SUTURE) ×6 IMPLANT
SUT VICRYL 2 TP 1 (SUTURE) IMPLANT
SYR 10ML LL (SYRINGE) ×2 IMPLANT
SYR 20ML LL LF (SYRINGE) ×2 IMPLANT
SYR 50ML LL SCALE MARK (SYRINGE) ×2 IMPLANT
SYSTEM SAHARA CHEST DRAIN ATS (WOUND CARE) ×2 IMPLANT
TAPE CLOTH 4X10 WHT NS (GAUZE/BANDAGES/DRESSINGS) ×2 IMPLANT
TIP APPLICATOR SPRAY EXTEND 16 (VASCULAR PRODUCTS) IMPLANT
TOWEL GREEN STERILE (TOWEL DISPOSABLE) ×2 IMPLANT
TRAY FOLEY MTR SLVR 16FR STAT (SET/KITS/TRAYS/PACK) ×2 IMPLANT
TROCAR XCEL 12X100 BLDLESS (ENDOMECHANICALS) ×2 IMPLANT
TUBING EXTENTION W/L.L. (IV SETS) ×2 IMPLANT

## 2021-07-09 NOTE — Brief Op Note (Signed)
07/08/2021 - 07/09/2021  12:17 PM  PATIENT:  Olivia Buchanan  70 y.o. female  PRE-OPERATIVE DIAGNOSIS:  Right upper lobe NSCC  POST-OPERATIVE DIAGNOSIS:Right upper lobe NSCC  PROCEDURE:  XI ROBOTIC ASSISTED THORASCOPY- RIGHT UPPER LOBECTOMY, RIGHT LYMPH NODE DISSECTION RIGHT INTERCOSTAL NERVE BLOCK   SURGEON:  Surgeon(s) and Role:    Lightfoot, Lucile Crater, MD - Primary  PHYSICIAN ASSISTANT: Lars Pinks PA-C  ANESTHESIA:   general  EBL:  100 mL   BLOOD ADMINISTERED:none  DRAINS:Blake drain placed in the right pleural sapce  LOCAL MEDICATIONS USED:  OTHER Exparel  SPECIMEN:  Source of Specimen:  RUL, multiple lymph nodes, and right chest wall biopsy  DISPOSITION OF SPECIMEN:  PATHOLOGY  COUNTS CORRECT:  YES  DICTATION: .Dragon Dictation  PLAN OF CARE: Admit to inpatient   PATIENT DISPOSITION:  PACU - hemodynamically stable.   Delay start of Pharmacological VTE agent (>24hrs) due to surgical blood loss or risk of bleeding: yes

## 2021-07-09 NOTE — Op Note (Signed)
      [address]       [place] [address]             [phone]        07/09/2021  Patient:  Melah D Beam [name] Pre-Op Dx: Right upper lobe NSCLC   Post-op Dx:  same Procedure: - Robotic assisted right video thoracoscopy - Right upper lobectomy - Mediastinal lymph node sampling - Intercostal nerve block  Surgeon and Role:      * Malcome Ambrocio, Lucile Crater, MD - Primary    Josie Saunders, PA-C - assisting  Anesthesia  general EBL:  216ml Blood Administration: none Specimen:  right upper lobe.  Chest wall biopsy, level 4 lymph node.  Hilar lymph node  Drains: 28 F argyle chest tube in right chest Counts: correct   Indications: 70 year old female with biopsy-proven non-small cell lung cancer of the right upper lobe.  She was also noted to have mediastinal and hilar lymphadenopathy which did not show much avidity on PET/CT.  She did undergo biopsy of her mediastinal lymph nodes twice and all results are negative.  We elected to proceed with a robotic assisted right upper lobectomy  Findings: Large tumor.  It was stuck to the chest wall.  Made to take this down with monopolar cautery.  A biopsy of this area was taken to ensure that there i was no tumor invasion.  This a very large mass, and the upper lobe was boggy.  She also had bulky hilar lymphadenopathy.  Level 4 lymph node was also enlarged.  Operative Technique: After the risks, benefits and alternatives were thoroughly discussed, the patient was brought to the operative theatre.  Anesthesia was induced, and the patient was then placed in a left lateral decubitus position and was prepped and draped in normal sterile fashion.  An appropriate surgical pause was performed, and pre-operative antibiotics were dosed accordingly.  We began by placing our 4 robotic ports in the the 7th intercostal space targeting the hilum of the lung.  A 57mm assistant port was placed in the 9th intercostal space in the anterior  axillary line.  The robot was then docked and all instruments were passed under direct visualization.    The lung was then retracted superiorly, and the inferior pulmonary ligament was divided.  The hilum was mobilized anteriorly and posteriorly.  We identified the upper lobe vein, and after careful isolation, it was divided with a vascular stapler.  We next moved to the pulmonary artery.  The artery was then divided with a vascular load stapler.  The bronchus to the upper lobe was then isolated.  After a test clamp, with good ventilation of the middle and lower lobes, the bronchus was then divided.  The fissure was completed, and the specimen was passed into an endocatch bag.  It was removed from the anterior access site.    Lymph nodes were then sampled at levels 4, and the hilum..  The chest was irrigated, and an air leak test was performed.  An intercostal nerve block was performed under direct visualization.  A 28 F chest with then placed, and we watch the remaining lobes re-expand.  The skin and soft tissue were closed with absorbable suture    The patient tolerated the procedure without any immediate complications, and was transferred to the PACU in stable condition.  Kristian Mogg Bary Leriche

## 2021-07-09 NOTE — Progress Notes (Signed)
     [address]       [place] [address]             [phone]       No events  Vitals:   07/09/21 0358 07/09/21 0748  BP:  (!) 127/53  Pulse:  86  Resp:  16  Temp: 98.3 F (36.8 C) 97.9 F (36.6 C)  SpO2:  96%   Alert NAD Sinus EWOB  OR today for R RATS, RULectomy  Carmelita Amparo O Judy Pollman

## 2021-07-09 NOTE — Anesthesia Preprocedure Evaluation (Addendum)
Anesthesia Evaluation  Patient identified by MRN, date of birth, ID band Patient awake    Reviewed: Allergy & Precautions, NPO status , Patient's Chart, lab work & pertinent test results  Airway Mallampati: I  TM Distance: >3 FB Neck ROM: Full    Dental  (+) Edentulous Upper, Edentulous Lower   Pulmonary asthma , COPD,  COPD inhaler, former smoker,    breath sounds clear to auscultation       Cardiovascular hypertension, Pt. on medications + CAD   Rhythm:Regular Rate:Normal  Echo: 1. Left ventricular ejection fraction, by estimation, is 60 to 65%. The  left ventricle has normal function. The left ventricle has no regional  wall motion abnormalities. Left ventricular diastolic parameters are  consistent with Grade I diastolic  dysfunction (impaired relaxation).  2. Right ventricular systolic function is normal. The right ventricular  size is normal.  3. The mitral valve is normal in structure. Mild mitral valve  regurgitation. No evidence of mitral stenosis.  4. The aortic valve is normal in structure. Aortic valve regurgitation is  not visualized. No aortic stenosis is present.  5. The inferior vena cava is normal in size with greater than 50%  respiratory variability, suggesting right atrial pressure of 3 mmHg.    Neuro/Psych PSYCHIATRIC DISORDERS Anxiety Depression    GI/Hepatic Neg liver ROS, GERD  Medicated,  Endo/Other  diabetes, Type 2, Oral Hypoglycemic Agents  Renal/GU negative Renal ROS     Musculoskeletal  (+) Arthritis ,   Abdominal Normal abdominal exam  (+)   Peds  Hematology negative hematology ROS (+)   Anesthesia Other Findings   Reproductive/Obstetrics                            Anesthesia Physical Anesthesia Plan  ASA: 3  Anesthesia Plan: General   Post-op Pain Management:    Induction: Intravenous  PONV Risk Score and Plan: 4 or greater and Ondansetron,  Dexamethasone, Midazolam and Scopolamine patch - Pre-op  Airway Management Planned: Double Lumen EBT  Additional Equipment: Arterial line  Intra-op Plan:   Post-operative Plan: Extubation in OR  Informed Consent: I have reviewed the patients History and Physical, chart, labs and discussed the procedure including the risks, benefits and alternatives for the proposed anesthesia with the patient or authorized representative who has indicated his/her understanding and acceptance.     Dental advisory given  Plan Discussed with: CRNA  Anesthesia Plan Comments: (Clearsight)       Anesthesia Quick Evaluation

## 2021-07-09 NOTE — Discharge Instructions (Signed)
Robot-Assisted Thoracic Surgery, Care After The following information offers guidance on how to care for yourself after your procedure. Your health care provider may also give you more specific instructions. If you have problems or questions, contact your health care provider. What can I expect after the procedure? After the procedure, it is common to have: Some pain and aches in the area of your surgical incisions. Pain when breathing in (inhaling) and coughing. Tiredness (fatigue). Trouble sleeping. Constipation. Follow these instructions at home: Medicines Take over-the-counter and prescription medicines only as told by your health care provider. If you were prescribed an antibiotic medicine, take it as told by your health care provider. Do not stop taking the antibiotic even if you start to feel better. Talk with your health care provider about safe and effective ways to manage pain after your procedure. Pain management should fit your specific health needs. Take pain medicine before pain becomes severe. Relieving and controlling your pain will make breathing easier for you. Ask your health care provider if the medicine prescribed to you requires you to avoid driving or using machinery. Eating and drinking Follow instructions from your health care provider about eating or drinking restrictions. These will vary depending on what procedure you had. Your health care provider may recommend: A liquid diet or soft diet for the first few days. Meals that are smaller and more frequent. A diet of fruits, vegetables, whole grains, and low-fat proteins. Limiting foods that are high in fat and processed sugar, including fried or sweet foods. Incision care Follow instructions from your health care provider about how to take care of your incisions. Make sure you: Wash your hands with soap and water for at least 20 seconds before and after you change your bandage (dressing). If soap and water are not  available, use hand sanitizer. Change your dressing as told by your health care provider. Leave stitches (sutures), skin glue, or adhesive strips in place. These skin closures may need to stay in place for 2 weeks or longer. If adhesive strip edges start to loosen and curl up, you may trim the loose edges. Do not remove adhesive strips completely unless your health care provider tells you to do that. Check your incision area every day for signs of infection. Check for: Redness, swelling, or more pain. Fluid or blood. Warmth. Pus or a bad smell. Activity Return to your normal activities as told by your health care provider. Ask your health care provider what activities are safe for you. Ask your health care provider when it is safe for you to drive. Do not lift anything that is heavier than 10 lb (4.5 kg), or the limit that you are told, until your health care provider says that it is safe. Rest as told by your health care provider. Avoid sitting for a long time without moving. Get up to take short walks every 1-2 hours. This is important to improve blood flow and breathing. Ask for help if you feel weak or unsteady. Do exercises as told by your health care provider. Pneumonia prevention Do deep breathing exercises and cough regularly as directed. This helps clear mucus and opens your lungs. Doing this helps prevent lung infection (pneumonia). If you were given an incentive spirometer, use it as told. An incentive spirometer is a tool that measures how well you are filling your lungs with each breath. Coughing may hurt less if you try to support your chest. This is called splinting. Try one of these when you cough:  Hold a pillow against your chest. Place the palms of both hands on top of your incision area. Do not use any products that contain nicotine or tobacco. These products include cigarettes, chewing tobacco, and vaping devices, such as e-cigarettes. If you need help quitting, ask your  health care provider. Avoid secondhand smoke. General instructions If you have a drainage tube: Follow instructions from your health care provider about how to take care of it. Do not travel by airplane after your tube is removed until your health care provider tells you it is safe. You may need to take these actions to prevent or treat constipation: Drink enough fluid to keep your urine pale yellow. Take over-the-counter or prescription medicines. Eat foods that are high in fiber, such as beans, whole grains, and fresh fruits and vegetables. Limit foods that are high in fat and processed sugars, such as fried or sweet foods. Keep all follow-up visits. This is important. Contact a health care provider if: You have redness, swelling, or more pain around an incision. You have fluid or blood coming from an incision. An incision feels warm to the touch. You have pus or a bad smell coming from an incision. You have a fever. You cannot eat or drink without vomiting. Your pain medicine is not controlling your pain. Get help right away if: You have chest pain. Your heart is beating quickly. You have trouble breathing. You have trouble speaking. You are confused. You feel weak or dizzy, or you faint. These symptoms may represent a serious problem that is an emergency. Do not wait to see if the symptoms will go away. Get medical help right away. Call your local emergency services (911 in the [place] Do not drive yourself to the hospital. Summary Talk with your health care provider about safe and effective ways to manage pain after your procedure. Pain management should fit your specific health needs. Return to your normal activities as told by your health care provider. Ask your health care provider what activities are safe for you. Do deep breathing exercises and cough regularly as directed. This helps to clear mucus and prevent pneumonia. If it hurts to cough, ease pain by holding a pillow  against your chest or by placing the palms of both hands over your incisions. This information is not intended to replace advice given to you by your health care provider. Make sure you discuss any questions you have with your health care provider. Document Revised: 06/13/2020 Document Reviewed: 06/13/2020 Elsevier Patient Education  2022 [employer].

## 2021-07-09 NOTE — Anesthesia Procedure Notes (Signed)
Procedure Name: Intubation Date/Time: 07/09/2021 9:00 AM Performed by: Claris Che, CRNA Pre-anesthesia Checklist: Patient identified, Emergency Drugs available, Suction available, Patient being monitored and Timeout performed Patient Re-evaluated:Patient Re-evaluated prior to induction Oxygen Delivery Method: Circle system utilized Preoxygenation: Pre-oxygenation with 100% oxygen Induction Type: IV induction and Cricoid Pressure applied Ventilation: Mask ventilation without difficulty Laryngoscope Size: Mac and 4 Grade View: Grade II Tube type: Oral Endobronchial tube: Double lumen EBT and EBT position confirmed by fiberoptic bronchoscope and 35 Fr Number of attempts: 1 Airway Equipment and Method: Stylet Placement Confirmation: ETT inserted through vocal cords under direct vision, positive ETCO2 and breath sounds checked- equal and bilateral Secured at: 26 cm Tube secured with: Tape Dental Injury: Teeth and Oropharynx as per pre-operative assessment

## 2021-07-09 NOTE — Transfer of Care (Signed)
Immediate Anesthesia Transfer of Care Note  Patient: Olivia Buchanan  Procedure(s) Performed: XI ROBOTIC ASSISTED THORASCOPY- RIGHT UPPER LOBECTOMY (Right: Chest) INTERCOSTAL NERVE BLOCK (Right: Chest) LYMPH NODE DISSECTION (Right: Chest)  Patient Location: PACU  Anesthesia Type:General  Level of Consciousness: oriented, drowsy, patient cooperative and responds to stimulation  Airway & Oxygen Therapy: Patient Spontanous Breathing and Patient connected to face mask oxygen  Post-op Assessment: Report given to RN, Post -op Vital signs reviewed and stable and Patient moving all extremities X 4  Post vital signs: Reviewed and stable  Last Vitals:  Vitals Value Taken Time  BP 114/55 07/09/21 1239  Temp    Pulse 100 07/09/21 1243  Resp 31 07/09/21 1243  SpO2 97 % 07/09/21 1243  Vitals shown include unvalidated device data.  Last Pain:  Vitals:   07/09/21 0748  TempSrc: Oral  PainSc: 0-No pain      Patients Stated Pain Goal: 0 (83/29/19 1660)  Complications: No notable events documented.

## 2021-07-09 NOTE — Hospital Course (Addendum)
HPI: Olivia Buchanan 70 y.o. female referred for surgical evaluation of a right upper lobe pulmonary mass.  It is biopsy-proven to be a non-small cell lung cancer.  She also has enlarged tracheal, and subcarinal lymph nodes as well as a groundglass opacity with a mixed density in the left upper lobe.  She was originally thought to be a locally advanced lung cancer referred for chemotherapy and radiation but this was all done before the PET/CT was performed.  The PET/CT does show avidity within the right upper lobe mass but no significant avidity within the pretracheal lymph nodes or the left upper lobe groundglass opacity.  She did undergo biopsy of the right upper lobe mass EBUS with lymph node biopsy.  The right upper lobe biopsy was positive, and the lymph node biopsies were negative.     She stopped smoking about 3 years ago.  She has significant anxiety.  She does endorse some headaches.  She was unable to undergo an MRI brain due to claustrophobia, but the CT head that was performed was negative for metastatic disease. Pulmonary function test showed DLCO % predicted 98. Dr. Valeta Harms did a bronchoscopy with endobronchial [place] and FNA on 06/23/2021 of 4R. Results were negative for cancer. Dr. Kipp Brood then discussed the need for robotic assisted right upper lobectomy. Potential risks, benefits, and complications of the surgery were discussed with the patient and she agreed to proceed with surgery.  Hospital Course: Patient underwent Xi robotic assisted RUL, lymph node sampling, intercostal nerve block by Dr. Kipp Brood on 07/09/2021. Patient was extubated and transferred from the OR to PACU in stable condition.  Her chest tube exhibited and air leak.  CXR was free of pneumothorax, therefore her chest tube was left on water seal.  She is likely diabetic with an A1c of 7.5, however she is on no medication prior to admission and will need to follow up with her primary care physician.  On the first  postoperative day, she was initially thought to have no air leak at all but later in the day as corrections were being made for chest tube removal, she had an obvious air leak.  The chest tube was left in place until postop day 3.  Chest x-ray remained stable.  She had several loose stools over the first few days following surgery which limited advancement with her mobility.  This improved with medication changes.  By the third postoperative day, the diarrhea had resolved.  We encouraged her to work on ambulation after chest tube removal in preparation for discharge on the following day.Pain has been an issue  that has limited mobilization and PT/OT consults were obtained to assist with this.

## 2021-07-09 NOTE — Anesthesia Postprocedure Evaluation (Signed)
Anesthesia Post Note  Patient: Olivia Buchanan  Procedure(s) Performed: XI ROBOTIC ASSISTED THORASCOPY- RIGHT UPPER LOBECTOMY (Right: Chest) INTERCOSTAL NERVE BLOCK (Right: Chest) LYMPH NODE DISSECTION (Right: Chest)     Patient location during evaluation: PACU Anesthesia Type: General Level of consciousness: awake and alert Pain management: pain level controlled Vital Signs Assessment: post-procedure vital signs reviewed and stable Respiratory status: spontaneous breathing, nonlabored ventilation, respiratory function stable and patient connected to nasal cannula oxygen Cardiovascular status: blood pressure returned to baseline and stable Postop Assessment: no apparent nausea or vomiting Anesthetic complications: no   No notable events documented.  Last Vitals:  Vitals:   07/09/21 1345 07/09/21 1502  BP: (!) 127/59 (!) 122/53  Pulse: 81 94  Resp: 15 13  Temp: 36.6 C   SpO2: 97% 95%    Last Pain:  Vitals:   07/09/21 1345  TempSrc: Oral  PainSc:                  [name]

## 2021-07-10 ENCOUNTER — Encounter (HOSPITAL_COMMUNITY): Payer: Self-pay | Admitting: Thoracic Surgery (Cardiothoracic Vascular Surgery)

## 2021-07-10 ENCOUNTER — Inpatient Hospital Stay (HOSPITAL_COMMUNITY): Payer: Medicare HMO

## 2021-07-10 DIAGNOSIS — Z9889 Other specified postprocedural states: Secondary | ICD-10-CM

## 2021-07-10 DIAGNOSIS — J9811 Atelectasis: Secondary | ICD-10-CM | POA: Diagnosis not present

## 2021-07-10 LAB — BASIC METABOLIC PANEL
Anion gap: 9 (ref 5–15)
BUN: 10 mg/dL (ref 8–23)
CO2: 29 mmol/L (ref 22–32)
Calcium: 8.4 mg/dL — ABNORMAL LOW (ref 8.9–10.3)
Chloride: 99 mmol/L (ref 98–111)
Creatinine, Ser: 0.96 mg/dL (ref 0.44–1.00)
GFR, Estimated: >60 mL/min (ref >60)
Glucose, Bld: 271 mg/dL — ABNORMAL HIGH (ref 70–99)
Potassium: 3.4 mmol/L — ABNORMAL LOW (ref 3.5–5.1)
Sodium: 137 mmol/L (ref 135–145)

## 2021-07-10 LAB — CBC
HCT: 31.6 % — ABNORMAL LOW (ref 36.0–46.0)
Hemoglobin: 10.5 g/dL — ABNORMAL LOW (ref 12.0–15.0)
MCH: 30.2 pg (ref 26.0–34.0)
MCHC: 33.2 g/dL (ref 30.0–36.0)
MCV: 90.8 fL (ref 80.0–100.0)
Platelets: 165 10*3/uL (ref 150–400)
RBC: 3.48 MIL/uL — ABNORMAL LOW (ref 3.87–5.11)
RDW: 13.3 % (ref 11.5–15.5)
WBC: 8.9 10*3/uL (ref 4.0–10.5)
nRBC: 0 % (ref 0.0–0.2)

## 2021-07-10 LAB — GLUCOSE, CAPILLARY
Glucose-Capillary: 144 mg/dL — ABNORMAL HIGH (ref 70–99)
Glucose-Capillary: 155 mg/dL — ABNORMAL HIGH (ref 70–99)
Glucose-Capillary: 236 mg/dL — ABNORMAL HIGH (ref 70–99)
Glucose-Capillary: 158 mg/dL — ABNORMAL HIGH (ref 70–99)

## 2021-07-10 MED ORDER — SODIUM CHLORIDE 0.9 % IV SOLN: INTRAVENOUS | Status: DC

## 2021-07-10 MED ORDER — SODIUM CHLORIDE 0.9 % IV BOLUS
250.0000 mL | Freq: Once | INTRAVENOUS | Status: AC
  Administered 2021-07-10: 250 mL via INTRAVENOUS

## 2021-07-10 NOTE — Discharge Summary (Signed)
Physician Discharge Summary  Patient ID: Olivia Buchanan MRN: [id number] DOB/AGE: [birth year] 68 y.o.  Admit date: 07/08/2021 Discharge date: 07/14/2021  Admission Diagnoses:  Patient Active Problem List   Diagnosis Date Noted   Neoplasm of upper lobe of right lung 07/09/2021   Lung cancer ([place]) 07/08/2021   Lung nodules 06/11/2021   Adenopathy 06/11/2021   Malignant neoplasm of upper lobe of right lung ([place]) 04/24/2021   HTN (hypertension) 12/08/2019   T2DM (type 2 diabetes mellitus) ([place]) 12/08/2019   COPD (chronic obstructive pulmonary disease) ([place]) 12/08/2019   Angioedema 12/08/2019   Discharge Diagnoses:  Patient Active Problem List   Diagnosis Date Noted   S/P Robotic Assisted Video Thoracoscopy with Right Upper Lobectomy 07/10/2021   Neoplasm of upper lobe of right lung 07/09/2021   Lung cancer ([place]) 07/08/2021   Lung nodules 06/11/2021   Adenopathy 06/11/2021   Malignant neoplasm of upper lobe of right lung ([place]) 04/24/2021   HTN (hypertension) 12/08/2019   T2DM (type 2 diabetes mellitus) ([place]) 12/08/2019   COPD (chronic obstructive pulmonary disease) ([place]) 12/08/2019   Angioedema 12/08/2019   Discharged Condition: stable  HPI: Olivia Buchanan 70 y.o. female referred for surgical evaluation of a right upper lobe pulmonary mass.  It is biopsy-proven to be a non-small cell lung cancer.  She also has enlarged tracheal, and subcarinal lymph nodes as well as a groundglass opacity with a mixed density in the left upper lobe.  She was originally thought to be a locally advanced lung cancer referred for chemotherapy and radiation but this was all done before the PET/CT was performed.  The PET/CT does show avidity within the right upper lobe mass but no significant avidity within the pretracheal lymph nodes or the left upper lobe groundglass opacity.  She did undergo biopsy of the right upper lobe mass EBUS with lymph node biopsy.  The right upper lobe biopsy was positive,  and the lymph node biopsies were negative.     She stopped smoking about 3 years ago.  She has significant anxiety.  She does endorse some headaches.  She was unable to undergo an MRI brain due to claustrophobia, but the CT head that was performed was negative for metastatic disease. Pulmonary function test showed DLCO % predicted 98. Dr. Valeta Harms did a bronchoscopy with endobronchial [place] and FNA on 06/23/2021 of 4R. Results were negative for cancer. Dr. Kipp Brood then discussed the need for robotic assisted right upper lobectomy. Potential risks, benefits, and complications of the surgery were discussed with the patient and she agreed to proceed with surgery.  Hospital Course: Patient underwent Xi robotic assisted RUL, lymph node sampling, intercostal nerve block by Dr. Kipp Brood on 07/09/2021. Patient was extubated and transferred from the OR to PACU in stable condition.  Her chest tube exhibited and air leak.  CXR was free of pneumothorax, therefore her chest tube was left on water seal.  She is likely diabetic with an A1c of 7.5, however she is on no medication prior to admission and will need to follow up with her primary care physician.  On the first postoperative day, she was initially thought to have no air leak at all but later in the day as corrections were being made for chest tube removal, she had an obvious air leak.  The chest tube was left in place until postop day 3.  Chest x-ray remained stable.  She had several loose stools over the first few days following surgery which limited advancement with  her mobility.  This improved with medication changes.  By the third postoperative day, the diarrhea had resolved.  We encouraged her to work on ambulation after chest tube removal in preparation for discharge on the following day. Her pain and muscle spasms lessened with a change in medication, as well as removal of chest tube. Her wounds are clean, dry, well healed. She has been tolerating a diet. She is  ambulating on room air with good oxygenation. CXR this am showed small right pleural effusion, opacity. As discussed with Dr. Shelbie Ammons, surgically stable for discharge.   Consults: None  Significant Diagnostic Studies: nuclear medicine:   IMPRESSION: 1. Hypermetabolic 3 cm right upper lobe lung mass consistent with known primary neoplasm. This lesion abuts the pleura and there is likely pleural involvement but no definite chest wall invasion. 2. No hypermetabolic mediastinal or hilar adenopathy and no findings for metastatic disease involving the neck, abdomen/pelvis or bony structures. 3. Persistent ground-glass nodules in both lungs which will require continued surveillance. 4. 3.2 cm abdominal aortic aneurysm as detailed above.  Treatments: surgery:   07/09/2021   Patient:  Olivia Buchanan Pre-Op Dx: Right upper lobe NSCLC   Post-op Dx:  same Procedure: - Robotic assisted right video thoracoscopy - Right upper lobectomy - Mediastinal lymph node sampling - Intercostal nerve block   Surgeon and Role:      * Lightfoot, Lucile Crater, MD - Primary    * Josie Saunders, PA-C - assisting  PATHOLOGY:    FINAL MICROSCOPIC DIAGNOSIS:   A. LYMPH NODE, HILAR, EXCISION:  - Lymph node negative for metastatic carcinoma.   B. LYMPH NODE, LEVEL 4, EXCISION:  - Lymph node negative for metastatic carcinoma.   C. LUNG, RIGHT UPPER LOBE, LOBECTOMY:  - Squamous cell carcinoma, 4.5 cm.  - Carcinoma extends through visceral pleura and focally involves  parietal pleura.  - Surgical margins negative for carcinoma.  - One lymph node negative for carcinoma.  - See oncology table and comment.   D. SOFT TISSUE, CHEST WALL, BIOPSY:  - Fibroadipose tissue with focal inflammation and fibrosis.  - No carcinoma identified.   ONCOLOGY TABLE:  LUNG: Resection  Synchronous Tumors: Not applicable.  Total Number of Primary Tumors: 1  Procedure: Right upper lobectomy with lymph node biopsies and  chest wall  biopsy.  Specimen Laterality: Right upper lobe.  Tumor Focality: Unifocal.  Tumor Site: Right upper lobe.  Tumor Size: 4.5 x 4 x 3 cm.  Histologic Type: Squamous cell carcinoma.  Visceral Pleura Invasion: Present.  Direct Invasion of Adjacent Structures: Focal involvement of parietal  pleura.  Lymphovascular Invasion: Not identified.  Margins: All surgical margins negative for carcinoma.  Regional Lymph Nodes:       Number of Lymph Nodes Involved: 0                            Nodal Sites with Tumor: 0       Number of Lymph Nodes Examined: 3                       Nodal Sites Examined: 2  Pathologic Stage Classification (pTNM, AJCC 8th Edition): pT3, pN0  Ancillary Studies: Can be performed if requested.  Representative Tumor Block: C3-C7  Comment(s): The carcinoma extends into the visceral pleura where there  is fibrosis and inflammation and there is focal extension into the  connective tissue of the attached parietal pleura.  (  [id number])  Discharge Exam: Blood pressure (!) 116/51, pulse 70, temperature 97.9 F (36.6 C), temperature source Oral, resp. rate 17, height 4\' 11"  (1.499 m), weight 87.1 kg, SpO2 95 %. Cardiovascular: RRR Pulmonary: Slightly diminished right base Abdomen: Soft, non tender, bowel sounds present. Extremities: Trace bilateral lower extremity edema. Wounds: Clean and dry.  No erythema or signs of infection.  Disposition: Stable and discharged to home. There are no questions and answers to display.        Allergies as of 07/14/2021       Reactions   Ibuprofen Hives   Macrobid [nitrofurantoin] Rash        Medication List     STOP taking these medications    HYDROcodone-acetaminophen 10-325 MG tablet Commonly known as: NORCO       TAKE these medications    albuterol 108 (90 Base) MCG/ACT inhaler Commonly known as: VENTOLIN HFA Inhale 1 puff into the lungs every 6 (six) hours as needed for wheezing.   aspirin EC 81 MG  tablet Take 81 mg by mouth at bedtime.   bisoprolol-hydrochlorothiazide 5-6.25 MG tablet Commonly known as: ZIAC Take 1 tablet by mouth daily.   cetirizine-pseudoephedrine 5-120 MG tablet Commonly known as: ZYRTEC-D Take 1 tablet by mouth at bedtime.   clonazePAM 0.5 MG tablet Commonly known as: KLONOPIN Take 0.5 mg by mouth 3 (three) times daily as needed for anxiety.   docusate sodium 100 MG capsule Commonly known as: COLACE Take 200 mg by mouth at bedtime.   EPINEPHrine 0.3 mg/0.3 mL Soaj injection Commonly known as: EPI-PEN Inject 0.3 mLs (0.3 mg total) into the muscle as needed for anaphylaxis.   fluticasone-salmeterol 250-50 MCG/ACT Aepb Commonly known as: ADVAIR Inhale 1 puff into the lungs in the morning and at bedtime.   gabapentin 300 MG capsule Commonly known as: NEURONTIN Take 300 mg by mouth 3 (three) times daily.   hydrochlorothiazide 25 MG tablet Commonly known as: HYDRODIURIL Take 25 mg by mouth daily.   ipratropium-albuterol 0.5-2.5 (3) MG/3ML Soln Commonly known as: DUONEB Take 3 mLs by nebulization 3 (three) times daily.   Melatonin 10 MG Tabs Take 10 mg by mouth at bedtime.   Multi-Vitamin tablet Take 1 tablet by mouth at bedtime.   nystatin cream Commonly known as: MYCOSTATIN Apply 1 application topically 2 (two) times daily as needed for dry skin (around lips).   ondansetron 8 MG tablet Commonly known as: ZOFRAN TAKE 1 TABLET BY MOUTH EVERY 8 HOURS AS NEEDED FOR NAUSEA OR VOMITING   oxyCODONE 5 MG immediate release tablet Commonly known as: Oxy IR/ROXICODONE Take 1 tablet (5 mg total) by mouth every 6 (six) hours as needed for moderate pain or severe pain.   pantoprazole 40 MG tablet Commonly known as: PROTONIX Take 40 mg by mouth at bedtime.   potassium chloride SA 20 MEQ tablet Commonly known as: KLOR-CON Take 20 mEq by mouth 2 (two) times daily.   pravastatin 80 MG tablet Commonly known as: PRAVACHOL Take 80 mg by mouth at  bedtime.   PROBIOTIC DAILY PO Take 1 capsule by mouth daily.   tiZANidine 4 MG tablet Commonly known as: ZANAFLEX Take 4 mg by mouth 3 (three) times daily.   venlafaxine XR 75 MG 24 hr capsule Commonly known as: EFFEXOR-XR Take 150 mg by mouth at bedtime.   vitamin C 1000 MG tablet Take 1,000 mg by mouth in the morning and at bedtime.        Follow-up Information  Lajuana Matte, MD. Go on 07/17/2021.   Specialty: Cardiothoracic Surgery Why: Your appointment is at 10:20 AM Contact information: [address] [place] [address] [phone]                 Signed: Arnoldo Lenis 07/14/2021, 7:24 AM

## 2021-07-10 NOTE — Progress Notes (Signed)
Pt bp 84/40, asymptomatic.  50cc CT drainage last 4 hours.  5 soft bm's today.  250cc bolus completed at 1900.  Bp remains low.  Dr. Roxan Hockey called, orders received.  Will continue to monitor pt.

## 2021-07-10 NOTE — Progress Notes (Addendum)
[address]       [place] [address]             [phone]      1 Day Post-Op Procedure(s) (LRB): XI ROBOTIC ASSISTED THORASCOPY- RIGHT UPPER LOBECTOMY (Right) INTERCOSTAL NERVE BLOCK (Right) LYMPH NODE DISSECTION (Right) Subjective: Feels ok, minor pain and nausea  Objective: Vital signs in last 24 hours: Temp:  [97.5 F (36.4 C)-98 F (36.7 C)] 97.6 F (36.4 C) (10/07 0302) Pulse Rate:  [76-99] 76 (10/07 0302) Cardiac Rhythm: Normal sinus rhythm (10/07 0703) Resp:  [11-16] 16 (10/07 0302) BP: (97-134)/(44-59) 98/50 (10/07 0302) SpO2:  [94 %-99 %] 97 % (10/07 0302)  Hemodynamic parameters for last 24 hours:    Intake/Output from previous day: 10/06 0701 - 10/07 0700 In: 2300 [I.V.:2300] Out: 1025 [Urine:925; Blood:100] Intake/Output this shift: No intake/output data recorded.  General appearance: alert, cooperative, and no distress Heart: regular rate and rhythm Lungs: clear to auscultation bilaterally Abdomen: benign Extremities: no edema Wound: incis healing well  Lab Results: Recent Labs    07/08/21 1535 07/10/21 0126  WBC 6.8 8.9  HGB 11.7* 10.5*  HCT 35.1* 31.6*  PLT 193 165   BMET:  Recent Labs    07/08/21 1535 07/10/21 0126  NA  --  137  K  --  3.4*  CL  --  99  CO2  --  29  GLUCOSE  --  271*  BUN  --  10  CREATININE 0.75 0.96  CALCIUM  --  8.4*    PT/INR: No results for input(s): LABPROT, INR in the last 72 hours. ABG    Component Value Date/Time   PHART 7.411 07/06/2021 1024   HCO3 26.1 07/06/2021 1024   O2SAT 95.4 07/06/2021 1024   CBG (last 3)  Recent Labs    07/09/21 1812 07/09/21 2255 07/10/21 0558  GLUCAP 253* 310* 144*    Meds Scheduled Meds:  acetaminophen  1,000 mg Oral Q6H   Or   acetaminophen (TYLENOL) oral liquid 160 mg/5 mL  1,000 mg Oral Q6H   acidophilus  1 capsule Oral Daily   aspirin EC  81 mg Oral QHS   bisacodyl  10 mg Oral Daily   docusate sodium  200 mg Oral QHS    enoxaparin (LOVENOX) injection  40 mg Subcutaneous Q24H   gabapentin  300 mg Oral TID   insulin aspart  0-24 Units Subcutaneous Q6H   ipratropium-albuterol  3 mL Nebulization TID   ketorolac  30 mg Intravenous Q6H   loratadine  10 mg Oral Daily   melatonin  10 mg Oral QHS   mometasone-formoterol  2 puff Inhalation BID   multivitamin with minerals  1 tablet Oral QHS   pantoprazole  40 mg Oral QHS   pravastatin  80 mg Oral QHS   tiZANidine  4 mg Oral TID   venlafaxine XR  150 mg Oral QHS   Continuous Infusions: PRN Meds:.clonazePAM, levalbuterol, morphine injection, ondansetron (ZOFRAN) IV  Xrays DG Chest Port 1 View  Result Date: 07/09/2021 CLINICAL DATA:  Pneumothorax, s/p right upper lobectomy EXAM: PORTABLE CHEST 1 VIEW COMPARISON:  Chest radiograph 07/06/2021 FINDINGS: A right chest tube is in place terminating in the right apex. The cardiomediastinal silhouette is stable. There has been interval right upper lobectomy. No definite pneumothorax is identified. The left lung is clear. There is no pleural effusion. There is no left pneumothorax. The bones are stable. IMPRESSION: Interval right upper lobectomy with  right apical chest tube placement; no definite pneumothorax is identified. Electronically Signed   By: Valetta Mole M.D.   On: 07/09/2021 15:17    Assessment/Plan: S/P Procedure(s) (LRB): XI ROBOTIC ASSISTED THORASCOPY- RIGHT UPPER LOBECTOMY (Right) INTERCOSTAL NERVE BLOCK (Right) LYMPH NODE DISSECTION (Right) POD#1  1 afeb, VSS 2 sats good on 2 liters 3 CT drainage not recorded, appears minimal, small air leak with cough, leave to water seal 4 CXR pending 5 normal renal fxn 6 expected ABLA - monitor clinically 7 BS elevated, Hg A1C 7.5, no preop meds- will need to be addressed as outpatient with PMD- cont SSI 8 routine rehab/pulm toilet     LOS: 2 days    John Giovanni PA-C Pager [phone]  07/10/2021  No leak on CT No pneumoT on CXR Will pull tube, and  discharge  [place]

## 2021-07-10 NOTE — Progress Notes (Signed)
Inpatient Diabetes Program Recommendations  AACE/ADA: New Consensus Statement on Inpatient Glycemic Control (2015)  Target Ranges:  Prepandial:   less than 140 mg/dL      Peak postprandial:   less than 180 mg/dL (1-2 hours)      Critically ill patients:  140 - 180 mg/dL   Lab Results  Component Value Date   GLUCAP 155 (H) 07/10/2021   HGBA1C 7.5 (H) 07/06/2021    Review of Glycemic Control Results for BEAM FAIZA, BANSAL (MRN [id number]) as of 07/10/2021 11:51  Ref. Range 07/09/2021 12:40 07/09/2021 18:12 07/09/2021 22:55 07/10/2021 05:58 07/10/2021 11:47  Glucose-Capillary Latest Ref Range: 70 - 99 mg/dL 231 (H) 253 (H) 310 (H) 144 (H) 155 (H)   Diabetes history: DM 2 Outpatient Diabetes medications: No meds prior to admit Current orders for Inpatient glycemic control:  Novolog TCTS q 6 hours  Inpatient Diabetes Program Recommendations:    Note patient received Decadron 10 mg intraop.  This has likely increased blood sugars.  May benefit from the start of Metformin 500 mg bid at discharge.  Blood sugars should improve during hospitalization.   Thanks,  Adah Perl, RN, BC-ADM Inpatient Diabetes Coordinator Pager [phone]  (8a-5p)

## 2021-07-10 NOTE — Plan of Care (Signed)

## 2021-07-11 ENCOUNTER — Inpatient Hospital Stay (HOSPITAL_COMMUNITY): Payer: Medicare HMO

## 2021-07-11 DIAGNOSIS — Z4682 Encounter for fitting and adjustment of non-vascular catheter: Secondary | ICD-10-CM | POA: Diagnosis not present

## 2021-07-11 LAB — COMPREHENSIVE METABOLIC PANEL
ALT: 13 U/L (ref 0–44)
AST: 25 U/L (ref 15–41)
Albumin: 2.5 g/dL — ABNORMAL LOW (ref 3.5–5.0)
Alkaline Phosphatase: 43 U/L (ref 38–126)
Anion gap: 6 (ref 5–15)
BUN: 17 mg/dL (ref 8–23)
CO2: 27 mmol/L (ref 22–32)
Calcium: 8.5 mg/dL — ABNORMAL LOW (ref 8.9–10.3)
Chloride: 104 mmol/L (ref 98–111)
Creatinine, Ser: 0.9 mg/dL (ref 0.44–1.00)
GFR, Estimated: >60 mL/min (ref >60)
Glucose, Bld: 170 mg/dL — ABNORMAL HIGH (ref 70–99)
Potassium: 3.5 mmol/L (ref 3.5–5.1)
Sodium: 137 mmol/L (ref 135–145)
Total Bilirubin: 0.4 mg/dL (ref 0.3–1.2)
Total Protein: 5.7 g/dL — ABNORMAL LOW (ref 6.5–8.1)

## 2021-07-11 LAB — CBC
HCT: 28 % — ABNORMAL LOW (ref 36.0–46.0)
Hemoglobin: 8.9 g/dL — ABNORMAL LOW (ref 12.0–15.0)
MCH: 29.8 pg (ref 26.0–34.0)
MCHC: 31.8 g/dL (ref 30.0–36.0)
MCV: 93.6 fL (ref 80.0–100.0)
Platelets: 143 10*3/uL — ABNORMAL LOW (ref 150–400)
RBC: 2.99 MIL/uL — ABNORMAL LOW (ref 3.87–5.11)
RDW: 13.6 % (ref 11.5–15.5)
WBC: 8.7 10*3/uL (ref 4.0–10.5)
nRBC: 0 % (ref 0.0–0.2)

## 2021-07-11 LAB — GLUCOSE, CAPILLARY
Glucose-Capillary: 133 mg/dL — ABNORMAL HIGH (ref 70–99)
Glucose-Capillary: 181 mg/dL — ABNORMAL HIGH (ref 70–99)
Glucose-Capillary: 168 mg/dL — ABNORMAL HIGH (ref 70–99)
Glucose-Capillary: 183 mg/dL — ABNORMAL HIGH (ref 70–99)

## 2021-07-11 MED ORDER — POTASSIUM CHLORIDE IN NACL 20-0.45 MEQ/L-% IV SOLN
INTRAVENOUS | Status: DC
  Filled 2021-07-11 (×3): qty 1000

## 2021-07-11 MED ORDER — LOPERAMIDE HCL 1 MG/7.5ML PO SUSP
1.0000 mg | ORAL | Status: DC | PRN
  Administered 2021-07-11: 1 mg via ORAL
  Filled 2021-07-11 (×2): qty 7.5

## 2021-07-11 MED ORDER — INSULIN ASPART 100 UNIT/ML IJ SOLN: 0.0000 [IU] | Freq: Every day | INTRAMUSCULAR | Status: DC

## 2021-07-11 MED ORDER — INSULIN ASPART 100 UNIT/ML IJ SOLN
0.0000 [IU] | Freq: Three times a day (TID) | INTRAMUSCULAR | Status: DC
  Administered 2021-07-11 – 2021-07-12 (×3): 3 [IU] via SUBCUTANEOUS
  Administered 2021-07-13: 2 [IU] via SUBCUTANEOUS
  Administered 2021-07-13: 3 [IU] via SUBCUTANEOUS
  Administered 2021-07-14 (×2): 2 [IU] via SUBCUTANEOUS

## 2021-07-11 NOTE — Progress Notes (Signed)
2 Days Post-Op Procedure(s) (LRB): XI ROBOTIC ASSISTED THORASCOPY- RIGHT UPPER LOBECTOMY (Right) INTERCOSTAL NERVE BLOCK (Right) LYMPH NODE DISSECTION (Right) Subjective: C/o diarrhea- 11 loose stools yesterday and overnight  Objective: Vital signs in last 24 hours: Temp:  [97.7 F (36.5 C)-97.9 F (36.6 C)] 97.8 F (36.6 C) (10/08 0314) Pulse Rate:  [62-76] 76 (10/08 0314) Cardiac Rhythm: Sinus tachycardia (10/08 0733) Resp:  [14-20] 20 (10/08 0314) BP: (82-100)/(40-58) 95/50 (10/08 0314) SpO2:  [97 %-100 %] 98 % (10/08 0314)  Hemodynamic parameters for last 24 hours:    Intake/Output from previous day: 10/07 0701 - 10/08 0700 In: 1320 [P.O.:1320] Out: 350 [Chest Tube:350] Intake/Output this shift: No intake/output data recorded.  General appearance: alert, cooperative, and no distress Neurologic: intact Heart: regular rate and rhythm Lungs: clear to auscultation bilaterally Abdomen: soft, + BS, mild discomfort Large tidal variation with small air leak  Lab Results: Recent Labs    07/10/21 0126 07/11/21 0112  WBC 8.9 8.7  HGB 10.5* 8.9*  HCT 31.6* 28.0*  PLT 165 143*   BMET:  Recent Labs    07/10/21 0126 07/11/21 0112  NA 137 137  K 3.4* 3.5  CL 99 104  CO2 29 27  GLUCOSE 271* 170*  BUN 10 17  CREATININE 0.96 0.90  CALCIUM 8.4* 8.5*    PT/INR: No results for input(s): LABPROT, INR in the last 72 hours. ABG    Component Value Date/Time   PHART 7.411 07/06/2021 1024   HCO3 26.1 07/06/2021 1024   O2SAT 95.4 07/06/2021 1024   CBG (last 3)  Recent Labs    07/10/21 1827 07/10/21 2315 07/11/21 0559  GLUCAP 236* 158* 133*    Assessment/Plan: S/P Procedure(s) (LRB): XI ROBOTIC ASSISTED THORASCOPY- RIGHT UPPER LOBECTOMY (Right) INTERCOSTAL NERVE BLOCK (Right) LYMPH NODE DISSECTION (Right) POD # 2  Small air leak, moderate drainage from CT Keep tube on water seal today Diarrhea- stop stool softeners, imodium PRN Continue IVF for now- change  to 1/2 NS with K Diet as tolerated Ambulate   LOS: 3 days    Melrose Nakayama 07/11/2021

## 2021-07-11 NOTE — Plan of Care (Signed)
  Problem: Education: Goal: Knowledge of General Education information will improve Description: Including pain rating scale, medication(s)/side effects and non-pharmacologic comfort measures Outcome: Progressing   Problem: Health Behavior/Discharge Planning: Goal: Ability to manage health-related needs will improve Outcome: Progressing   Problem: Clinical Measurements: Goal: Ability to maintain clinical measurements within normal limits will improve Outcome: Progressing Goal: Will remain free from infection Outcome: Progressing Goal: Cardiovascular complication will be avoided Outcome: Progressing   Problem: Activity: Goal: Risk for activity intolerance will decrease Outcome: Progressing   Problem: Nutrition: Goal: Adequate nutrition will be maintained Outcome: Progressing   Problem: Coping: Goal: Level of anxiety will decrease Outcome: Progressing   Problem: Elimination: Goal: Will not experience complications related to urinary retention Outcome: Progressing   Problem: Safety: Goal: Ability to remain free from injury will improve Outcome: Progressing   Problem: Skin Integrity: Goal: Risk for impaired skin integrity will decrease Outcome: Progressing

## 2021-07-12 ENCOUNTER — Inpatient Hospital Stay (HOSPITAL_COMMUNITY): Payer: Medicare HMO

## 2021-07-12 DIAGNOSIS — I517 Cardiomegaly: Secondary | ICD-10-CM | POA: Diagnosis not present

## 2021-07-12 DIAGNOSIS — J939 Pneumothorax, unspecified: Secondary | ICD-10-CM | POA: Diagnosis not present

## 2021-07-12 LAB — GLUCOSE, CAPILLARY
Glucose-Capillary: 111 mg/dL — ABNORMAL HIGH (ref 70–99)
Glucose-Capillary: 171 mg/dL — ABNORMAL HIGH (ref 70–99)
Glucose-Capillary: 113 mg/dL — ABNORMAL HIGH (ref 70–99)
Glucose-Capillary: 197 mg/dL — ABNORMAL HIGH (ref 70–99)

## 2021-07-12 NOTE — Plan of Care (Signed)
  Problem: Education: Goal: Knowledge of General Education information will improve Description: Including pain rating scale, medication(s)/side effects and non-pharmacologic comfort measures Outcome: Progressing   Problem: Health Behavior/Discharge Planning: Goal: Ability to manage health-related needs will improve Outcome: Progressing   Problem: Clinical Measurements: Goal: Ability to maintain clinical measurements within normal limits will improve Outcome: Progressing Goal: Respiratory complications will improve Outcome: Progressing   Problem: Activity: Goal: Risk for activity intolerance will decrease Outcome: Progressing   Problem: Coping: Goal: Level of anxiety will decrease Outcome: Progressing   Problem: Pain Managment: Goal: General experience of comfort will improve Outcome: Progressing   Problem: Skin Integrity: Goal: Risk for impaired skin integrity will decrease Outcome: Progressing

## 2021-07-12 NOTE — Progress Notes (Signed)
3 Days Post-Op Procedure(s) (LRB): XI ROBOTIC ASSISTED THORASCOPY- RIGHT UPPER LOBECTOMY (Right) INTERCOSTAL NERVE BLOCK (Right) LYMPH NODE DISSECTION (Right) Subjective: C/o burning pain at chest tube site  Objective: Vital signs in last 24 hours: Temp:  [98 F (36.7 C)-99.7 F (37.6 C)] 98.3 F (36.8 C) (10/09 0730) Pulse Rate:  [82-89] 89 (10/09 0249) Cardiac Rhythm: Sinus tachycardia (10/09 0815) Resp:  [19-20] 20 (10/09 0249) BP: (92-119)/(39-55) 116/55 (10/09 0730) SpO2:  [89 %-97 %] 95 % (10/09 0854)  Hemodynamic parameters for last 24 hours:    Intake/Output from previous day: 10/08 0701 - 10/09 0700 In: 7619 [P.O.:1030; I.V.:824] Out: 730 [Urine:350; Chest Tube:380] Intake/Output this shift: Total I/O In: -  Out: 40 [Chest Tube:40]  General appearance: alert, cooperative, and uncomfortable Neurologic: intact Heart: regular rate and rhythm Lungs: diminished breath sounds bibasilar + tidal movement but no air leak  Lab Results: Recent Labs    07/10/21 0126 07/11/21 0112  WBC 8.9 8.7  HGB 10.5* 8.9*  HCT 31.6* 28.0*  PLT 165 143*   BMET:  Recent Labs    07/10/21 0126 07/11/21 0112  NA 137 137  K 3.4* 3.5  CL 99 104  CO2 29 27  GLUCOSE 271* 170*  BUN 10 17  CREATININE 0.96 0.90  CALCIUM 8.4* 8.5*    PT/INR: No results for input(s): LABPROT, INR in the last 72 hours. ABG    Component Value Date/Time   PHART 7.411 07/06/2021 1024   HCO3 26.1 07/06/2021 1024   O2SAT 95.4 07/06/2021 1024   CBG (last 3)  Recent Labs    07/11/21 1700 07/11/21 2130 07/12/21 0604  GLUCAP 168* 183* 111*    Assessment/Plan: S/P Procedure(s) (LRB): XI ROBOTIC ASSISTED THORASCOPY- RIGHT UPPER LOBECTOMY (Right) INTERCOSTAL NERVE BLOCK (Right) LYMPH NODE DISSECTION (Right) POD # 3 No air leak and CXR stable- dc chest tube Diarrhea resolved Pain should improve once chest tube removed Needs to ambulate   LOS: 4 days    Olivia Buchanan 07/12/2021

## 2021-07-13 ENCOUNTER — Inpatient Hospital Stay (HOSPITAL_COMMUNITY): Payer: Medicare HMO

## 2021-07-13 DIAGNOSIS — J90 Pleural effusion, not elsewhere classified: Secondary | ICD-10-CM | POA: Diagnosis not present

## 2021-07-13 DIAGNOSIS — R079 Chest pain, unspecified: Secondary | ICD-10-CM | POA: Diagnosis not present

## 2021-07-13 DIAGNOSIS — J939 Pneumothorax, unspecified: Secondary | ICD-10-CM | POA: Diagnosis not present

## 2021-07-13 LAB — BASIC METABOLIC PANEL
Anion gap: 8 (ref 5–15)
BUN: 6 mg/dL — ABNORMAL LOW (ref 8–23)
CO2: 25 mmol/L (ref 22–32)
Calcium: 8.3 mg/dL — ABNORMAL LOW (ref 8.9–10.3)
Chloride: 105 mmol/L (ref 98–111)
Creatinine, Ser: 0.62 mg/dL (ref 0.44–1.00)
GFR, Estimated: >60 mL/min (ref >60)
Glucose, Bld: 187 mg/dL — ABNORMAL HIGH (ref 70–99)
Potassium: 3.6 mmol/L (ref 3.5–5.1)
Sodium: 138 mmol/L (ref 135–145)

## 2021-07-13 LAB — CBC WITH DIFFERENTIAL/PLATELET
Abs Immature Granulocytes: 0.05 10*3/uL (ref 0.00–0.07)
Basophils Absolute: 0 10*3/uL (ref 0.0–0.1)
Basophils Relative: 0 %
Eosinophils Absolute: 0.3 10*3/uL (ref 0.0–0.5)
Eosinophils Relative: 4 %
HCT: 30.8 % — ABNORMAL LOW (ref 36.0–46.0)
Hemoglobin: 10.1 g/dL — ABNORMAL LOW (ref 12.0–15.0)
Immature Granulocytes: 1 %
Lymphocytes Relative: 20 %
Lymphs Abs: 1.5 10*3/uL (ref 0.7–4.0)
MCH: 30.1 pg (ref 26.0–34.0)
MCHC: 32.8 g/dL (ref 30.0–36.0)
MCV: 91.9 fL (ref 80.0–100.0)
Monocytes Absolute: 0.5 10*3/uL (ref 0.1–1.0)
Monocytes Relative: 7 %
Neutro Abs: 5.3 10*3/uL (ref 1.7–7.7)
Neutrophils Relative %: 68 %
Platelets: 168 10*3/uL (ref 150–400)
RBC: 3.35 MIL/uL — ABNORMAL LOW (ref 3.87–5.11)
RDW: 13.8 % (ref 11.5–15.5)
WBC: 7.7 10*3/uL (ref 4.0–10.5)
nRBC: 0 % (ref 0.0–0.2)

## 2021-07-13 LAB — SURGICAL PATHOLOGY

## 2021-07-13 LAB — GLUCOSE, CAPILLARY
Glucose-Capillary: 127 mg/dL — ABNORMAL HIGH (ref 70–99)
Glucose-Capillary: 153 mg/dL — ABNORMAL HIGH (ref 70–99)
Glucose-Capillary: 116 mg/dL — ABNORMAL HIGH (ref 70–99)
Glucose-Capillary: 134 mg/dL — ABNORMAL HIGH (ref 70–99)

## 2021-07-13 MED ORDER — OXYCODONE HCL 5 MG PO TABS
5.0000 mg | ORAL_TABLET | Freq: Four times a day (QID) | ORAL | Status: DC | PRN
Start: 2021-07-13 — End: 2021-07-14
  Administered 2021-07-13 – 2021-07-14 (×4): 5 mg via ORAL
  Filled 2021-07-13 (×4): qty 1

## 2021-07-13 MED ORDER — TRAMADOL HCL 50 MG PO TABS
50.0000 mg | ORAL_TABLET | Freq: Four times a day (QID) | ORAL | Status: DC | PRN
  Administered 2021-07-13 – 2021-07-14 (×3): 50 mg via ORAL
  Filled 2021-07-13 (×3): qty 1

## 2021-07-13 NOTE — Evaluation (Signed)
Physical Therapy Evaluation Patient Details Name: Olivia Buchanan MRN: [id number] DOB: March 17, 1951 Today's Date: 07/13/2021  History of Present Illness  Pt is a 70 y/o female admitted 10/5 secondary to R lung cancer. Pt is s/p R upper lobectomy on 10/6. PMH includes CAD, asthma, COPD, HTN and DM.  Clinical Impression  Pt admitted secondary to problem above with deficits below. Pt with increased pain, fatigue and SOB during ambulation. Oxygen sats WFL on RA. Required min guard A for mobility tasks this session. Educated about using rollator to help increase safety and independence at d/c. Pt reports husband can assist if needed. Will continue to follow acutely.        Recommendations for follow up therapy are one component of a multi-disciplinary discharge planning process, led by the attending physician.  Recommendations may be updated based on patient status, additional functional criteria and insurance authorization.  Follow Up Recommendations Home health PT;Supervision for mobility/OOB    Equipment Recommendations  None recommended by PT (Pt reports she has 2 rollators in storage)    Recommendations for Other Services       Precautions / Restrictions Precautions Precautions: Fall Restrictions Weight Bearing Restrictions: No      Mobility  Bed Mobility Overal bed mobility: Needs Assistance Bed Mobility: Supine to Sit     Supine to sit: Min guard     General bed mobility comments: Min guard for safety. Cues to brace pillow to help with pain management.    Transfers Overall transfer level: Needs assistance Equipment used: None Transfers: Sit to/from Stand Sit to Stand: Min guard         General transfer comment: Min guard for safety  Ambulation/Gait Ambulation/Gait assistance: Min guard Gait Distance (Feet): 125 Feet Assistive device: None;1 person hand held assist Gait Pattern/deviations: Step-through pattern;Decreased stride length Gait velocity:  Decreased   General Gait Details: Slow, guarded gait. Required standing rest X2 secondary to SOB. Oxygen sats WFL on RA. Min guard for safety. Educated about using rollator at home to increase safety.  Stairs            Wheelchair Mobility    Modified Rankin (Stroke Patients Only)       Balance Overall balance assessment: Needs assistance Sitting-balance support: No upper extremity supported;Feet supported Sitting balance-Leahy Scale: Fair     Standing balance support: No upper extremity supported;During functional activity Standing balance-Leahy Scale: Fair                               Pertinent Vitals/Pain Pain Assessment: Faces Faces Pain Scale: Hurts even more Pain Location: R upper chest and back area Pain Descriptors / Indicators: Grimacing;Guarding Pain Intervention(s): Limited activity within patient's tolerance;Monitored during session;Repositioned    Home Living Family/patient expects to be discharged to:: Private residence Living Arrangements: Spouse/significant other Available Help at Discharge: Family Type of Home: House Home Access: Stairs to enter   Technical brewer of Steps: 1 Home Layout: One level Home Equipment: Environmental consultant - 4 wheels;Shower seat      Prior Function Level of Independence: Independent               Hand Dominance        Extremity/Trunk Assessment   Upper Extremity Assessment Upper Extremity Assessment: Defer to OT evaluation    Lower Extremity Assessment Lower Extremity Assessment: Generalized weakness    Cervical / Trunk Assessment Cervical / Trunk Assessment: Kyphotic  Communication  Communication: No difficulties  Cognition Arousal/Alertness: Awake/alert Behavior During Therapy: WFL for tasks assessed/performed Overall Cognitive Status: Within Functional Limits for tasks assessed                                        General Comments General comments (skin integrity,  edema, etc.): Educated about activity pacing at home    Exercises     Assessment/Plan    PT Assessment Patient needs continued PT services  PT Problem List Decreased strength;Decreased activity tolerance;Decreased balance;Decreased mobility;Decreased knowledge of precautions;Decreased knowledge of use of DME;Cardiopulmonary status limiting activity       PT Treatment Interventions Gait training;DME instruction;Stair training;Functional mobility training;Therapeutic activities;Balance training;Therapeutic exercise;Patient/family education    PT Goals (Current goals can be found in the Care Plan section)  Acute Rehab PT Goals Patient Stated Goal: to go home PT Goal Formulation: With patient Time For Goal Achievement: 07/27/21 Potential to Achieve Goals: Good    Frequency Min 3X/week   Barriers to discharge        Co-evaluation               AM-PAC PT "6 Clicks" Mobility  Outcome Measure Help needed turning from your back to your side while in a flat bed without using bedrails?: A Little Help needed moving from lying on your back to sitting on the side of a flat bed without using bedrails?: A Little Help needed moving to and from a bed to a chair (including a wheelchair)?: A Little Help needed standing up from a chair using your arms (e.g., wheelchair or bedside chair)?: A Little Help needed to walk in hospital room?: A Little Help needed climbing 3-5 steps with a railing? : A Little 6 Click Score: 18    End of Session Equipment Utilized During Treatment: Gait belt Activity Tolerance: Patient tolerated treatment well Patient left: with call bell/phone within reach;in chair Nurse Communication: Mobility status PT Visit Diagnosis: Other abnormalities of gait and mobility (R26.89);Difficulty in walking, not elsewhere classified (R26.2)    Time: 0347-4259 PT Time Calculation (min) (ACUTE ONLY): 21 min   Charges:   PT Evaluation $PT Eval Moderate Complexity: 1 Mod           Reuel Derby, PT, DPT  Acute Rehabilitation Services  Pager: [phone] Office: [phone]   Rudean Hitt 07/13/2021, 9:33 AM

## 2021-07-13 NOTE — Plan of Care (Signed)

## 2021-07-13 NOTE — Progress Notes (Addendum)
[address]       [place] [address]             [phone]      4 Days Post-Op Procedure(s) (LRB): XI ROBOTIC ASSISTED THORASCOPY- RIGHT UPPER LOBECTOMY (Right) INTERCOSTAL NERVE BLOCK (Right) LYMPH NODE DISSECTION (Right) Subjective: C/o significant pain limiting mobility and deep breaths, sharp ans spasm like  Objective: Vital signs in last 24 hours: Temp:  [97.9 F (36.6 C)-98.3 F (36.8 C)] 97.9 F (36.6 C) (10/10 0300) Pulse Rate:  [70-101] 101 (10/10 0720) Cardiac Rhythm: Normal sinus rhythm (10/10 0300) Resp:  [15-19] 16 (10/10 0720) BP: (105-116)/(50-57) 110/50 (10/10 0446) SpO2:  [92 %-95 %] 94 % (10/10 0720)  Hemodynamic parameters for last 24 hours:    Intake/Output from previous day: 10/09 0701 - 10/10 0700 In: 1791 [I.V.:1791] Out: 40 [Chest Tube:40] Intake/Output this shift: No intake/output data recorded.  General appearance: alert, cooperative, and no distress Heart: regular rate and rhythm Lungs: dim right>left base Abdomen: benign Extremities: + edema Wound: incis healing well  Lab Results: Recent Labs    07/11/21 0112  WBC 8.7  HGB 8.9*  HCT 28.0*  PLT 143*   BMET:  Recent Labs    07/11/21 0112  NA 137  K 3.5  CL 104  CO2 27  GLUCOSE 170*  BUN 17  CREATININE 0.90  CALCIUM 8.5*    PT/INR: No results for input(s): LABPROT, INR in the last 72 hours. ABG    Component Value Date/Time   PHART 7.411 07/06/2021 1024   HCO3 26.1 07/06/2021 1024   O2SAT 95.4 07/06/2021 1024   CBG (last 3)  Recent Labs    07/12/21 1601 07/12/21 2059 07/13/21 0610  GLUCAP 113* 197* 127*    Meds Scheduled Meds:  acetaminophen  1,000 mg Oral Q6H   Or   acetaminophen (TYLENOL) oral liquid 160 mg/5 mL  1,000 mg Oral Q6H   aspirin EC  81 mg Oral QHS   enoxaparin (LOVENOX) injection  40 mg Subcutaneous Q24H   gabapentin  300 mg Oral TID   insulin aspart  0-15 Units Subcutaneous TID WC   insulin aspart  0-5 Units  Subcutaneous QHS   loratadine  10 mg Oral Daily   melatonin  10 mg Oral QHS   mometasone-formoterol  2 puff Inhalation BID   multivitamin with minerals  1 tablet Oral QHS   pantoprazole  40 mg Oral QHS   pravastatin  80 mg Oral QHS   tiZANidine  4 mg Oral TID   venlafaxine XR  150 mg Oral QHS   Continuous Infusions:  0.45 % NaCl with KCl 20 mEq / L 10 mL/hr at 07/12/21 1058   PRN Meds:.clonazePAM, levalbuterol, loperamide HCl, morphine injection, ondansetron (ZOFRAN) IV  Xrays DG Chest 1V REPEAT Same Day  Result Date: 07/12/2021 CLINICAL DATA:  Chest tube removed EXAM: CHEST - 1 VIEW SAME DAY COMPARISON:  Chest radiograph from earlier today. FINDINGS: Stable partially visualized surgical hardware from ACDF. Surgical clips overlie the right lung apex. Stable cardiomediastinal silhouette with mild cardiomegaly. Small right apical pneumothorax, not substantially changed, approximately 5%. No left pneumothorax. No pleural effusion. No overt pulmonary edema. Hazy mild bibasilar lung opacities, slightly improved. IMPRESSION: 1. Stable small right apical pneumothorax status post right chest tube removal. 2. Hazy mild bibasilar lung opacities, slightly improved, favor atelectasis. Electronically Signed   By: Ilona Sorrel M.D.   On: 07/12/2021 13:19   DG Chest [facility]  1 View  Result Date: 07/12/2021 CLINICAL DATA:  70 year old female status post lobectomy of the lung. EXAM: PORTABLE CHEST 1 VIEW COMPARISON:  Chest x-ray 07/11/2021. FINDINGS: Postoperative changes of right upper lobectomy are again noted. Right-sided chest tube stable in position with tip near the apex of the right hemithorax. Patchy areas of interstitial prominence an ill-defined opacities are noted in the right mid to lower lung, as well as in the left lung base. Small pneumothorax in the apex of the right hemithorax (less than 5% of the volume of the right hemithorax) is noted. No left pneumothorax. No evidence of pulmonary edema. Heart  size is normal. The patient is rotated to the right on today's exam, resulting in distortion of the mediastinal contours and reduced diagnostic sensitivity and specificity for mediastinal pathology. Orthopedic fixation hardware in the lower cervical spine incidentally noted. IMPRESSION: 1. Postoperative changes and support apparatus, as above. 2. Small right apical pneumothorax occupying less than 5% of the volume of the right hemithorax. 3. Slight worsening of aeration in the mid to lower lungs bilaterally (right greater than left), which could reflect areas of atelectasis and/or developing airspace consolidation. Electronically Signed   By: Vinnie Langton M.D.   On: 07/12/2021 08:36    Assessment/Plan: S/P Procedure(s) (LRB): XI ROBOTIC ASSISTED THORASCOPY- RIGHT UPPER LOBECTOMY (Right) INTERCOSTAL NERVE BLOCK (Right) LYMPH NODE DISSECTION (Right)  1 afeb, VSS 2 sats good on RA 3 voiding well 4 CXR - no pntx, some increased  right lower air space disease- some concern for developing consolidation/pneumonia- push flutter valve/IS 5 she is on zanaflex for muscle spasms, add oxy and ultram prn as pain significantly limiting mobility- was on hydrocodone preop 6 hasn't ambulated well, should be better today with tube out if we can get pain controlled 7 check labs to make sure not developing leukocytosis 8 cont lovenox for DVT ppx, she does have lower ext edema without tenderness Check BMET   LOS: 5 days    John Giovanni PA-C Pager [phone] 07/13/2021    Needs aggressive ambulation, and pulm toilet Adjusting pain meds [place]

## 2021-07-14 ENCOUNTER — Inpatient Hospital Stay (HOSPITAL_COMMUNITY): Payer: Medicare HMO

## 2021-07-14 DIAGNOSIS — R918 Other nonspecific abnormal finding of lung field: Secondary | ICD-10-CM | POA: Diagnosis not present

## 2021-07-14 DIAGNOSIS — Z9889 Other specified postprocedural states: Secondary | ICD-10-CM | POA: Diagnosis not present

## 2021-07-14 LAB — GLUCOSE, CAPILLARY
Glucose-Capillary: 125 mg/dL — ABNORMAL HIGH (ref 70–99)
Glucose-Capillary: 148 mg/dL — ABNORMAL HIGH (ref 70–99)

## 2021-07-14 MED ORDER — OXYCODONE HCL 5 MG PO TABS: 5.0000 mg | ORAL_TABLET | Freq: Four times a day (QID) | ORAL | 0 refills | Status: DC | PRN

## 2021-07-14 NOTE — Progress Notes (Addendum)
      [address]       [place] [address]             [phone]       5 Days Post-Op Procedure(s) (LRB): XI ROBOTIC ASSISTED THORASCOPY- RIGHT UPPER LOBECTOMY (Right) INTERCOSTAL NERVE BLOCK (Right) LYMPH NODE DISSECTION (Right)  Subjective: Patient's pain much better controlled and no longer with spasms. She still has some pain with deep breathing, but overall, better. She had a bowel movement yesterday. She would like to go home.  Objective: Vital signs in last 24 hours: Temp:  [97.4 F (36.3 C)-98.2 F (36.8 C)] 97.9 F (36.6 C) (10/11 0334) Pulse Rate:  [66-101] 70 (10/10 2343) Cardiac Rhythm: Normal sinus rhythm (10/10 1912) Resp:  [16-18] 17 (10/11 0336) BP: (98-132)/(49-56) 116/51 (10/11 0336) SpO2:  [90 %-98 %] 95 % (10/11 0336)      Intake/Output from previous day: 10/10 0701 - 10/11 0700 In: 747.3 [P.O.:720; I.V.:27.3] Out: -    Physical Exam:  Cardiovascular: RRR Pulmonary: Slightly diminished right base Abdomen: Soft, non tender, bowel sounds present. Extremities: Trace bilateral lower extremity edema. Wounds: Clean and dry.  No erythema or signs of infection.   Lab Results: CBC: Recent Labs    07/13/21 0804  WBC 7.7  HGB 10.1*  HCT 30.8*  PLT 168   BMET:  Recent Labs    07/13/21 0804  NA 138  K 3.6  CL 105  CO2 25  GLUCOSE 187*  BUN 6*  CREATININE 0.62  CALCIUM 8.3*    PT/INR: No results for input(s): LABPROT, INR in the last 72 hours. ABG:  INR: Will add last result for INR, ABG once components are confirmed Will add last 4 CBG results once components are confirmed  Assessment/Plan:  1. CV - SR 2.  Pulmonary - History of COPD. On room air. CXR this am appears stable. Encourage incentive spirometer. 3. DM-CBGs 116/134/125. Pre op HGA1C 7.5. As discussed with patient, she is not on any medication. She will follow up with her medical doctor after discharge 4. Expected post op blood loss anemia-Last H and  H stable at 10.1 and 30.8 5. As discussed with Dr. Kipp Brood, will discharge later today  [facility] 07/14/2021,7:08 AM [phone]

## 2021-07-14 NOTE — Social Work (Signed)
CSW provided mental health resources for mental health counseling. OT consulted CSW stating that she believes pt is struggling with dealing with her cancer diagnosis and lifestyle changes/depression. CSW signing off.

## 2021-07-14 NOTE — Progress Notes (Signed)
Discharge instructions (including medications) discussed with and copy provided to patient. Husband at beside during d/c.  RN answered all questions. IV removed. VSS at discharge. Patient belongings gathered and sent with patient. NT discharged patient via wheelchair through [employer] entrance to private vehicle.

## 2021-07-14 NOTE — TOC Transition Note (Signed)
Transition of Care [facility]) - CM/SW Discharge Note   Patient Details  Name: Olivia Buchanan MRN: [id number] Date of Birth: 05/01/1951  Transition of Care [facility]) CM/SW Contact:  Ella Bodo, RN Phone Number: 07/14/2021, 10:21 AM   Clinical Narrative:    Pt is a 70 y/o female admitted 10/5 secondary to R lung cancer. Pt is s/p R upper lobectomy on 10/6. PTA, pt independent and living at home with spouse; she states that her husband and daughter are able to provide all needed assistance.  PT recommending [facility] follow up, but pt politely declines follow up.  No DME recommended; no other discharge needs identified.   Final next level of care: Home/Self Care Barriers to Discharge: Barriers Resolved   Patient Goals and CMS Choice Patient states their goals for this hospitalization and ongoing recovery are:: to go home      Discharge Placement                       Discharge Plan and Services   Discharge Planning Services: CM Consult                      HH Arranged: Patient Refused [facility]          Social Determinants of Health (SDOH) Interventions     Readmission Risk Interventions Readmission Risk Prevention Plan 07/14/2021  Transportation Screening Complete  PCP or Specialist Appt within 5-7 Days Complete  Home Care Screening Complete  Medication Review (RN CM) Complete  Some recent data might be hidden   Reinaldo Raddle, RN, BSN  Trauma/Neuro ICU Case Manager [phone]

## 2021-07-14 NOTE — Evaluation (Signed)
Occupational Therapy Evaluation Patient Details Name: Olivia Buchanan MRN: [id number] DOB: April 08, 1951 Today's Date: 07/14/2021   History of Present Illness Pt is a 70 y/o female admitted 10/5 secondary to R lung cancer. Pt is s/p R upper lobectomy on 10/6. PMH includes CAD, asthma, COPD, HTN and DM.   Clinical Impression   PTA pt independent with ADL and mobility. Completed education regarding compensatory strategies and use of energy conservation for ADL and functional mobility for ADL to maximize independence and reduce risk of falls. Pt became emotional during session regarding dealing with cancer and lifestyle changes. Asked SW to provide pt with mental health resources. Pt very appreciative. No further OT needed.      Recommendations for follow up therapy are one component of a multi-disciplinary discharge planning process, led by the attending physician.  Recommendations may be updated based on patient status, additional functional criteria and insurance authorization.   Follow Up Recommendations  No OT follow up    Equipment Recommendations  None recommended by OT    Recommendations for Other Services Other (comment) (SW counsult for mental health resources)     Precautions / Restrictions Precautions Precautions: Fall      Mobility Bed Mobility Overal bed mobility: Modified Independent                  Transfers Overall transfer level: Modified independent                    Balance                                           ADL either performed or assessed with clinical judgement   ADL Overall ADL's : Needs assistance/impaired                                       General ADL Comments: Has adapted techniques of comleting LB ADL since SCI. Educated pt on home set up to increase independence and reduce risk of falls. Educated on energy conservation strategies. Pt has shower chair that she can use. Daughter can  assist as needed.     Vision         Perception     Praxis      Pertinent Vitals/Pain Pain Assessment: Faces Faces Pain Scale: Hurts a little bit Pain Location: R upper chest and back area when coughing Pain Descriptors / Indicators: Guarding Pain Intervention(s): Limited activity within patient's tolerance     Hand Dominance Right   Extremity/Trunk Assessment Upper Extremity Assessment Upper Extremity Assessment: Overall WFL for tasks assessed (sensation deficits from previous SCI)   Lower Extremity Assessment Lower Extremity Assessment: Defer to PT evaluation (BLE numbness at baseline)   Cervical / Trunk Assessment Cervical / Trunk Assessment: Kyphotic   Communication Communication Communication: No difficulties   Cognition Arousal/Alertness: Awake/alert Behavior During Therapy: WFL for tasks assessed/performed Overall Cognitive Status: Within Functional Limits for tasks assessed                                     General Comments  general fatigue - encouraged use of rollator - pt verbalized understanding.    Exercises Exercises: Other exercises Other Exercises Other Exercises:  reviewed "splinting" with pillow Other Exercises: pursed lip breathing   Shoulder Instructions      Home Living Family/patient expects to be discharged to:: Private residence Living Arrangements: Spouse/significant other Available Help at Discharge: Family Type of Home: House Home Access: Stairs to enter Technical brewer of Steps: 1   Home Layout: One level     Bathroom Shower/Tub: Teacher, early years/pre: Handicapped height Bathroom Accessibility: Yes How Accessible: Accessible via walker Home Equipment: [place] - 4 wheels;Shower seat;Hand held shower head          Prior Functioning/Environment Level of Independence: Independent                 OT Problem List: Decreased activity tolerance;Cardiopulmonary status limiting  activity;Obesity;Pain      OT Treatment/Interventions:      OT Goals(Current goals can be found in the care plan section) Acute Rehab OT Goals Patient Stated Goal: to go home OT Goal Formulation: All assessment and education complete, DC therapy  OT Frequency:     Barriers to D/C:            Co-evaluation              AM-PAC OT "6 Clicks" Daily Activity     Outcome Measure Help from another person eating meals?: None Help from another person taking care of personal grooming?: None Help from another person toileting, which includes using toliet, bedpan, or urinal?: None Help from another person bathing (including washing, rinsing, drying)?: A Little Help from another person to put on and taking off regular upper body clothing?: None Help from another person to put on and taking off regular lower body clothing?: A Little 6 Click Score: 22   End of Session Nurse Communication: Mobility status;Other (comment) (recommend outpt mental health counseling)  Activity Tolerance: Patient tolerated treatment well Patient left: in chair;with call bell/phone within reach  OT Visit Diagnosis: Unsteadiness on feet (R26.81);Muscle weakness (generalized) (M62.81);Pain Pain - part of body:  (chest)                Time: 2979-8921 OT Time Calculation (min): 22 min Charges:  OT General Charges $OT Visit: 1 Visit OT Evaluation $OT Eval Moderate Complexity: [place], OT/L   Acute OT Clinical Specialist Acute Rehabilitation Services Pager [phone] Office [phone]   [facility] 07/14/2021, 9:40 AM

## 2021-07-14 NOTE — Plan of Care (Signed)
  Problem: Education: Goal: Knowledge of General Education information will improve Description: Including pain rating scale, medication(s)/side effects and non-pharmacologic comfort measures 07/14/2021 0243 by Shanon Ace, RN Outcome: Progressing 07/14/2021 0242 by Shanon Ace, RN Outcome: Progressing   Problem: Health Behavior/Discharge Planning: Goal: Ability to manage health-related needs will improve 07/14/2021 0243 by Shanon Ace, RN Outcome: Progressing 07/14/2021 0242 by Shanon Ace, RN Outcome: Progressing   Problem: Clinical Measurements: Goal: Ability to maintain clinical measurements within normal limits will improve 07/14/2021 0243 by Shanon Ace, RN Outcome: Progressing 07/14/2021 0242 by Shanon Ace, RN Outcome: Progressing Goal: Will remain free from infection 07/14/2021 0243 by Shanon Ace, RN Outcome: Progressing 07/14/2021 0242 by Shanon Ace, RN Outcome: Progressing Goal: Diagnostic test results will improve 07/14/2021 0243 by Shanon Ace, RN Outcome: Progressing 07/14/2021 0242 by Shanon Ace, RN Outcome: Progressing Goal: Respiratory complications will improve 07/14/2021 0243 by Shanon Ace, RN Outcome: Progressing 07/14/2021 0242 by Shanon Ace, RN Outcome: Progressing Goal: Cardiovascular complication will be avoided 07/14/2021 0243 by Shanon Ace, RN Outcome: Progressing 07/14/2021 0242 by Shanon Ace, RN Outcome: Progressing   Problem: Activity: Goal: Risk for activity intolerance will decrease 07/14/2021 0243 by Shanon Ace, RN Outcome: Progressing 07/14/2021 0242 by Shanon Ace, RN Outcome: Progressing   Problem: Nutrition: Goal: Adequate nutrition will be maintained 07/14/2021 0243 by Shanon Ace, RN Outcome: Progressing 07/14/2021 0242 by Shanon Ace, RN Outcome: Progressing   Problem: Coping: Goal: Level of anxiety will decrease 07/14/2021 0243 by Shanon Ace, RN Outcome:  Progressing 07/14/2021 0242 by Shanon Ace, RN Outcome: Progressing   Problem: Elimination: Goal: Will not experience complications related to bowel motility 07/14/2021 0243 by Shanon Ace, RN Outcome: Progressing 07/14/2021 0242 by Shanon Ace, RN Outcome: Progressing Goal: Will not experience complications related to urinary retention 07/14/2021 0243 by Shanon Ace, RN Outcome: Progressing 07/14/2021 0242 by Shanon Ace, RN Outcome: Progressing   Problem: Pain Managment: Goal: General experience of comfort will improve 07/14/2021 0243 by Shanon Ace, RN Outcome: Progressing 07/14/2021 0242 by Shanon Ace, RN Outcome: Progressing   Problem: Safety: Goal: Ability to remain free from injury will improve 07/14/2021 0243 by Shanon Ace, RN Outcome: Progressing 07/14/2021 0242 by Shanon Ace, RN Outcome: Progressing   Problem: Skin Integrity: Goal: Risk for impaired skin integrity will decrease 07/14/2021 0243 by Shanon Ace, RN Outcome: Progressing 07/14/2021 0242 by Shanon Ace, RN Outcome: Progressing   Problem: Education: Goal: Knowledge of disease or condition will improve Outcome: Progressing Goal: Knowledge of the prescribed therapeutic regimen will improve Outcome: Progressing   Problem: Activity: Goal: Risk for activity intolerance will decrease Outcome: Progressing   Problem: Cardiac: Goal: Will achieve and/or maintain hemodynamic stability Outcome: Progressing   Problem: Clinical Measurements: Goal: Postoperative complications will be avoided or minimized Outcome: Progressing   Problem: Respiratory: Goal: Respiratory status will improve Outcome: Progressing   Problem: Pain Management: Goal: Pain level will decrease Outcome: Progressing   Problem: Skin Integrity: Goal: Wound healing without signs and symptoms infection will improve Outcome: Progressing

## 2021-07-14 NOTE — Plan of Care (Signed)

## 2021-07-15 ENCOUNTER — Other Ambulatory Visit: Payer: Self-pay | Admitting: *Deleted

## 2021-07-15 DIAGNOSIS — Z9889 Other specified postprocedural states: Secondary | ICD-10-CM

## 2021-07-15 NOTE — Progress Notes (Signed)
Patient contacted stating her chest tube incision was squirting clear pink fluid. Patient states she noticed her shirt and pants were saturated this morning from the drainage. Patient states drainage has no odor. Photo sent to RN office phone. Observation matches patient's description. Advised patient drainage is likely her small pleural fluid draining from site. Patient denies fever or SOB. Advised patient to cover incision with gauze padding and tape and monitor how often dressing has to be changed. Advised to call if drainage changes in color or frequency. Patient has f/u appt with Dr. Kipp Brood this Friday with chest xray. Patient aware and verbalizes understanding.

## 2021-07-16 ENCOUNTER — Other Ambulatory Visit: Payer: Self-pay | Admitting: Internal Medicine

## 2021-07-17 ENCOUNTER — Ambulatory Visit (INDEPENDENT_AMBULATORY_CARE_PROVIDER_SITE_OTHER): Payer: Self-pay | Admitting: Thoracic Surgery (Cardiothoracic Vascular Surgery)

## 2021-07-17 ENCOUNTER — Other Ambulatory Visit: Payer: Self-pay

## 2021-07-17 ENCOUNTER — Ambulatory Visit
Admission: RE | Admit: 2021-07-17 | Discharge: 2021-07-17 | Disposition: A | Discharge status: Home / Self Care | Payer: Medicare HMO | Source: Ambulatory Visit | Attending: Thoracic Surgery (Cardiothoracic Vascular Surgery) | Admitting: Thoracic Surgery (Cardiothoracic Vascular Surgery)

## 2021-07-17 VITALS — BP 114/68 | HR 67 | Resp 20 | Ht 59.0 in | Wt 196.0 lb

## 2021-07-17 DIAGNOSIS — R918 Other nonspecific abnormal finding of lung field: Secondary | ICD-10-CM | POA: Diagnosis not present

## 2021-07-17 DIAGNOSIS — Z9889 Other specified postprocedural states: Secondary | ICD-10-CM

## 2021-07-17 NOTE — Progress Notes (Signed)
      [address]       [place], [address]             [phone]        Geralda D Beam Jones Morris Medical Record #[id number] Date of Birth: Jul 02, 1951  Referring: Cammie Sickle, * Primary Care: Idelle Crouch, MD Primary Cardiologist:None  Reason for visit:   follow-up  History of Present Illness:     [name] comes in for 1 week follow-up appointment.  She continues to have some pain.  She denies any respiratory symptoms.  Physical Exam: BP 114/68 (BP Location: Right Arm, Patient Position: Sitting)   Pulse 67   Resp 20   Ht 4\' 11"  (1.499 m)   Wt 196 lb (88.9 kg)   SpO2 92% Comment: RA  BMI 39.59 kg/m   Alert NAD Incision clean, stitch removed.   Abdomen soft, ND No peripheral edema   Diagnostic Studies & Laboratory data:  Path:  FINAL MICROSCOPIC DIAGNOSIS:   A. LYMPH NODE, HILAR, EXCISION:  - Lymph node negative for metastatic carcinoma.   B. LYMPH NODE, LEVEL 4, EXCISION:  - Lymph node negative for metastatic carcinoma.   C. LUNG, RIGHT UPPER LOBE, LOBECTOMY:  - Squamous cell carcinoma, 4.5 cm.  - Carcinoma extends through visceral pleura and focally involves  parietal pleura.  - Surgical margins negative for carcinoma.  - One lymph node negative for carcinoma.  - See oncology table and comment.   D. SOFT TISSUE, CHEST WALL, BIOPSY:  - Fibroadipose tissue with focal inflammation and fibrosis.  - No carcinoma identified.   ONCOLOGY TABLE:  LUNG: Resection  Synchronous Tumors: Not applicable.  Total Number of Primary Tumors: 1  Procedure: Right upper lobectomy with lymph node biopsies and chest wall  biopsy.  Specimen Laterality: Right upper lobe.  Tumor Focality: Unifocal.  Tumor Site: Right upper lobe.  Tumor Size: 4.5 x 4 x 3 cm.  Histologic Type: Squamous cell carcinoma.  Visceral Pleura Invasion: Present.  Direct Invasion of Adjacent Structures: Focal involvement of parietal  pleura.  Lymphovascular  Invasion: Not identified.  Margins: All surgical margins negative for carcinoma.  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  She was referred to medical oncology to discuss adjuvant therapy.  She will follow-up with me in 1 month with a chest x-ray.   Lajuana Matte 07/17/2021 4:22 PM

## 2021-07-20 DIAGNOSIS — R091 Pleurisy: Secondary | ICD-10-CM | POA: Diagnosis not present

## 2021-07-20 DIAGNOSIS — R8290 Unspecified abnormal findings in urine: Secondary | ICD-10-CM | POA: Diagnosis not present

## 2021-07-20 DIAGNOSIS — Z79899 Other long term (current) drug therapy: Secondary | ICD-10-CM | POA: Diagnosis not present

## 2021-07-20 DIAGNOSIS — C3411 Malignant neoplasm of upper lobe, right bronchus or lung: Secondary | ICD-10-CM | POA: Diagnosis not present

## 2021-07-23 ENCOUNTER — Other Ambulatory Visit: Payer: Self-pay | Admitting: *Deleted

## 2021-07-23 ENCOUNTER — Telehealth: Payer: Self-pay

## 2021-07-23 ENCOUNTER — Other Ambulatory Visit: Payer: Self-pay

## 2021-07-23 ENCOUNTER — Inpatient Hospital Stay: Payer: Medicare HMO | Attending: Internal Medicine | Admitting: Internal Medicine

## 2021-07-23 ENCOUNTER — Other Ambulatory Visit: Payer: Medicare HMO

## 2021-07-23 ENCOUNTER — Encounter: Payer: Self-pay | Admitting: Internal Medicine

## 2021-07-23 ENCOUNTER — Other Ambulatory Visit: Payer: Self-pay | Admitting: Internal Medicine

## 2021-07-23 ENCOUNTER — Encounter: Payer: Self-pay | Admitting: *Deleted

## 2021-07-23 VITALS — BP 124/55 | HR 71 | Temp 98.7°F | Resp 17 | Wt 188.0 lb

## 2021-07-23 DIAGNOSIS — E119 Type 2 diabetes mellitus without complications: Secondary | ICD-10-CM | POA: Insufficient documentation

## 2021-07-23 DIAGNOSIS — C3490 Malignant neoplasm of unspecified part of unspecified bronchus or lung: Secondary | ICD-10-CM | POA: Diagnosis not present

## 2021-07-23 DIAGNOSIS — C3411 Malignant neoplasm of upper lobe, right bronchus or lung: Secondary | ICD-10-CM | POA: Insufficient documentation

## 2021-07-23 DIAGNOSIS — J069 Acute upper respiratory infection, unspecified: Secondary | ICD-10-CM | POA: Insufficient documentation

## 2021-07-23 DIAGNOSIS — H6692 Otitis media, unspecified, left ear: Secondary | ICD-10-CM | POA: Diagnosis not present

## 2021-07-23 MED ORDER — AMOXICILLIN 500 MG PO CAPS: 500.0000 mg | ORAL_CAPSULE | Freq: Three times a day (TID) | ORAL | 0 refills | Status: DC

## 2021-07-23 NOTE — Progress Notes (Signed)
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 Spiculated mass of the posterior right pulmonary apex measuring 3.2 x 2.8 cm, consistent with primary lung malignancy. 2. Enlarged right hilar and pretracheal lymph nodes measuring up to 1.6 x 1.5 cm, suspicious for nodal metastatic disease. 3. Recommend multidisciplinary thoracic referral for consideration of PET-CT metabolic characterization and tissue sampling. 4. Background of very fine centrilobular nodularity throughout the lungs, most concentrated at the apices. Scattered bronchiolar plugging. Findings are most consistent with smoking-related respiratory bronchiolitis. 5. Coarse, nodular contour of the liver in the included upper abdomen, suggestive of cirrhosis. 6. Coronary artery disease.  # COPD-  # DM- diet controlled; chronic back pain- hydrocodone 5 day; chronic left lower extremity weakness.  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 LYMPH NODE, HILAR, EXCISION:  - Lymph node negative for metastatic carcinoma.   B. LYMPH NODE, LEVEL 4, EXCISION:  - Lymph node negative for metastatic carcinoma.   C. LUNG, RIGHT UPPER LOBE, LOBECTOMY:  - Squamous cell carcinoma, 4.5 cm.  - Carcinoma extends through visceral pleura and focally involves  parietal pleura.  - Surgical margins negative for carcinoma.  - One lymph node negative for carcinoma.  - See oncology table and comment.   D. SOFT TISSUE, CHEST WALL, BIOPSY:  - Fibroadipose tissue with  focal inflammation and fibrosis.  - No carcinoma identified.   ONCOLOGY TABLE:  LUNG: Resection  Synchronous Tumors: Not applicable.  Total Number of Primary Tumors: 1  Procedure: Right upper lobectomy with lymph node biopsies and chest wall  biopsy.  Specimen Laterality: Right upper lobe.  Tumor Focality: Unifocal.  Tumor Site: Right upper lobe.  Tumor Size: 4.5 x 4 x 3 cm.  Histologic Type: Squamous cell carcinoma.  Visceral Pleura Invasion: Present.  Direct Invasion of Adjacent Structures: Focal involvement of parietal  pleura.  Lymphovascular Invasion: Not identified.  Margins: All surgical margins negative for carcinoma.  Regional Lymph Nodes:       Number of Lymph Nodes Involved: 0                            Nodal Sites with Tumor: 0       Number of Lymph Nodes Examined: 3                       Nodal Sites Examined: 2  Pathologic Stage Classification (pTNM, AJCC 8th Edition): pT3, pN0  Ancillary Studies: Can be performed if requested.    Malignant neoplasm of upper lobe of right lung ([place])  04/24/2021 Initial Diagnosis   Malignant neoplasm of upper lobe of right lung ([place])   05/02/2021 Cancer Staging   Staging form: Lung, AJCC 8th Edition - Clinical: Stage IB (cT2a, cN0, cM0) - Signed by Cammie Sickle, MD on 05/02/2021 Stage prefix: Initial diagnosis   07/23/2021 Cancer Staging   Staging form: Lung, AJCC 8th Edition - Pathologic: Stage IIB (pT3,  pN0, cM0) - Signed by Cammie Sickle, MD on 07/23/2021 Stage prefix: Initial diagnosis    HISTORY OF PRESENTING ILLNESS: Patient in wheelchair.  Accompanied by family.    Olivia Buchanan 70 y.o.  female right upper lobe squamous cell carcinoma is here today with results of her posterior pathology/plan of care.  Patient complains of pain at the site of surgery.  Especially taking deep breaths.  Denies any palpitations.  Denies any new onset of chest pain or shortness breath or cough.  Complains of nasal  stuffiness.  Complains of left earache.  Review of Systems  Constitutional:  Positive for malaise/fatigue and weight loss. Negative for chills, diaphoresis and fever.  HENT:  Negative for nosebleeds and sore throat.   Eyes:  Negative for double vision.  Respiratory:  Positive for cough, sputum production and shortness of breath. Negative for hemoptysis and wheezing.   Cardiovascular:  Negative for chest pain, palpitations, orthopnea and leg swelling.  Gastrointestinal:  Positive for nausea. Negative for abdominal pain, blood in stool, constipation, diarrhea, heartburn, melena and vomiting.  Genitourinary:  Negative for dysuria, frequency and urgency.  Musculoskeletal:  Positive for back pain, joint pain and neck pain.  Skin: Negative.  Negative for itching and rash.  Neurological:  Negative for tingling, focal weakness and weakness.  Endo/Heme/Allergies:  Does not bruise/bleed easily.  Psychiatric/Behavioral:  Negative for depression. The patient is not nervous/anxious and does not have insomnia.     MEDICAL HISTORY:  Past Medical History:  Diagnosis Date   Angioedema 12/08/2019   Anxiety    Aortic atherosclerosis (HCC)    Asthma    CAD (coronary artery disease)    3 vessel   Cancer (HCC)    COPD (chronic obstructive pulmonary disease) ([place]) 12/08/2019   DDD (degenerative disc disease), lumbar    Depression    Diabetes mellitus without complication (HCC)    no meds, diet controlled   Dyspnea    GERD (gastroesophageal reflux disease)    HTN (hypertension) 12/08/2019   Hyperlipidemia    Hypertension    Mass of upper lobe of right lung 04/02/2021   spiculated mass RIGHT posterior pulmonary apex with associated hilar and pretracheal LAD; measured 3.2 x 2.8 cm   Obesity (BMI 30-39.9)    T2DM (type 2 diabetes mellitus) ([place]) 12/08/2019    SURGICAL HISTORY: Past Surgical History:  Procedure Laterality Date   BACK SURGERY     lumbar L5-S1 ruptured disc x 3 surgeries   CERVICAL  FUSION  2009   FINE NEEDLE ASPIRATION  06/23/2021   Procedure: FINE NEEDLE ASPIRATION (FNA) LINEAR;  Surgeon: Garner Nash, DO;  Location: [facility];  Service: Pulmonary;;   INTERCOSTAL NERVE BLOCK Right 07/09/2021   Procedure: INTERCOSTAL NERVE BLOCK;  Surgeon: Lajuana Matte, MD;  Location: [facility];  Service: Thoracic;  Laterality: Right;   [place]   with oophorectomy   LYMPH NODE DISSECTION Right 07/09/2021   Procedure: LYMPH NODE DISSECTION;  Surgeon: Lajuana Matte, MD;  Location: [facility];  Service: Thoracic;  Laterality: Right;   TONSILLECTOMY     VIDEO BRONCHOSCOPY WITH ENDOBRONCHIAL NAVIGATION N/A 04/15/2021   Procedure: VIDEO BRONCHOSCOPY WITH ENDOBRONCHIAL NAVIGATION;  Surgeon: Ottie Glazier, MD;  Location: [facility];  Service: Thoracic;  Laterality: N/A;   VIDEO BRONCHOSCOPY WITH ENDOBRONCHIAL ULTRASOUND N/A 04/15/2021   Procedure: VIDEO BRONCHOSCOPY WITH ENDOBRONCHIAL ULTRASOUND;  Surgeon: Ottie Glazier, MD;  Location: [facility];  Service: Thoracic;  Laterality: N/A;  VIDEO BRONCHOSCOPY WITH ENDOBRONCHIAL ULTRASOUND N/A 06/23/2021   Procedure: VIDEO BRONCHOSCOPY WITH ENDOBRONCHIAL ULTRASOUND;  Surgeon: Garner Nash, DO;  Location: [facility];  Service: Pulmonary;  Laterality: N/A;    SOCIAL HISTORY: Social History   Socioeconomic History   Marital status: Married    Spouse name: Dwight   Number of children: 3   Years of education: Not on file   Highest education level: Not on file  Occupational History   Not on file  Tobacco Use   Smoking status: Former    Packs/day: 3.00    Years: 50.00    Pack years: 150.00    Types: Cigarettes    Quit date: 2019    Years since quitting: 3.8   Smokeless tobacco: Never  Vaping Use   Vaping Use: Never used  Substance and Sexual Activity   Alcohol use: Not Currently   Drug use: Never   Sexual activity: Not on file    Comment: Hysterectomy  Other Topics Concern   Not on file   Social History Narrative   Lives in home; with husband; lives in [place]; never smoking 2019; no alcohol. 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    Social Determinants of Health   Financial Resource Strain: Not on file  Food Insecurity: Not on file  Transportation Needs: Not on file  Physical Activity: Not on file  Stress: Not on file  Social Connections: Not on file  Intimate Partner Violence: Not on file    FAMILY HISTORY: Family History  Problem Relation Age of Onset   Bone cancer Maternal Uncle     ALLERGIES:  is allergic to ibuprofen and macrobid [nitrofurantoin].  MEDICATIONS:  Current Outpatient Medications  Medication Sig Dispense Refill   albuterol (VENTOLIN HFA) 108 (90 Base) MCG/ACT inhaler Inhale 1 puff into the lungs every 6 (six) hours as needed for wheezing.     amoxicillin (AMOXIL) 500 MG capsule Take 1 capsule (500 mg total) by mouth 3 (three) times daily. 21 capsule 0   Ascorbic Acid (VITAMIN C) 1000 MG tablet Take 1,000 mg by mouth in the morning and at bedtime.     aspirin EC 81 MG tablet Take 81 mg by mouth at bedtime.     bisoprolol-hydrochlorothiazide (ZIAC) 5-6.25 MG tablet Take 1 tablet by mouth daily.     cetirizine-pseudoephedrine (ZYRTEC-D) 5-120 MG tablet Take 1 tablet by mouth at bedtime.     docusate sodium (COLACE) 100 MG capsule Take 200 mg by mouth at bedtime.     EPINEPHrine 0.3 mg/0.3 mL IJ SOAJ injection Inject 0.3 mLs (0.3 mg total) into the muscle as needed for anaphylaxis. 2 each 0   fluticasone-salmeterol (ADVAIR) 250-50 MCG/ACT AEPB Inhale 1 puff into the lungs in the morning and at bedtime.     gabapentin (NEURONTIN) 300 MG capsule Take 300 mg by mouth 3 (three) times daily.     hydrochlorothiazide (HYDRODIURIL) 25 MG tablet Take 25 mg by mouth daily.     ipratropium-albuterol (DUONEB) 0.5-2.5 (3) MG/3ML SOLN Take 3 mLs by nebulization 3 (three) times daily.     Melatonin 10 MG TABS Take 10 mg by mouth at  bedtime.     Multiple Vitamin (MULTI-VITAMIN) tablet Take 1 tablet by mouth at bedtime.     nystatin cream (MYCOSTATIN) Apply 1 application topically 2 (two) times daily as needed for dry skin (around lips).     ondansetron (ZOFRAN) 8 MG tablet TAKE 1 TABLET BY MOUTH EVERY 8 HOURS AS NEEDED  FOR NAUSEA OR VOMITING 20 tablet 1   oxyCODONE (OXY IR/ROXICODONE) 5 MG immediate release tablet Take 1 tablet (5 mg total) by mouth every 6 (six) hours as needed for moderate pain or severe pain. 30 tablet 0   oxyCODONE-acetaminophen (PERCOCET) 10-325 MG tablet Take by mouth.     pantoprazole (PROTONIX) 40 MG tablet Take 40 mg by mouth at bedtime.      potassium chloride SA (KLOR-CON) 20 MEQ tablet Take 20 mEq by mouth 2 (two) times daily.     pravastatin (PRAVACHOL) 80 MG tablet Take 80 mg by mouth at bedtime.      Probiotic Product (PROBIOTIC DAILY PO) Take 1 capsule by mouth daily.     tiZANidine (ZANAFLEX) 4 MG tablet Take 4 mg by mouth 3 (three) times daily.     venlafaxine XR (EFFEXOR-XR) 75 MG 24 hr capsule Take 150 mg by mouth at bedtime.     clonazePAM (KLONOPIN) 0.5 MG tablet Take 0.5 mg by mouth 3 (three) times daily as needed for anxiety. (Patient not taking: Reported on 07/23/2021)     clonazePAM (KLONOPIN) 0.5 MG tablet Take 1 tablet by mouth 3 (three) times daily. (Patient not taking: Reported on 07/23/2021)     No current facility-administered medications for this visit.      Marland Kitchen  PHYSICAL EXAMINATION: ECOG PERFORMANCE STATUS: 1 - Symptomatic but completely ambulatory  Vitals:   07/23/21 1122  BP: (!) 124/55  Pulse: 71  Resp: 17  Temp: 98.7 F (37.1 C)  SpO2: 95%   Filed Weights   07/23/21 1122  Weight: 188 lb (85.3 kg)    Physical Exam Vitals and nursing note reviewed.  Constitutional:      Comments: Patient is accompanied by daughter.  Ambulating independently.  HENT:     Head: Normocephalic and atraumatic.     Mouth/Throat:     Mouth: Mucous membranes are moist.      Pharynx: Oropharynx is clear. No oropharyngeal exudate.  Eyes:     Extraocular Movements: Extraocular movements intact.     Pupils: Pupils are equal, round, and reactive to light.  Cardiovascular:     Rate and Rhythm: Normal rate and regular rhythm.  Pulmonary:     Effort: No respiratory distress.     Breath sounds: No wheezing.     Comments: Decreased breath sounds bilaterally at bases.  No wheeze or crackles Abdominal:     General: Bowel sounds are normal. There is no distension.     Palpations: Abdomen is soft. There is no mass.     Tenderness: There is no abdominal tenderness. There is no guarding or rebound.  Musculoskeletal:        General: No tenderness. Normal range of motion.     Cervical back: Normal range of motion and neck supple.  Skin:    General: Skin is warm.  Neurological:     General: No focal deficit present.     Mental Status: She is alert and oriented to person, place, and time.     Comments: 4-5 strength in the left lower extremity/chronic.  Psychiatric:        Mood and Affect: Affect normal.        Behavior: Behavior normal.        Judgment: Judgment normal.     LABORATORY DATA:  I have reviewed the data as listed Lab Results  Component Value Date   WBC 7.7 07/13/2021   HGB 10.1 (L) 07/13/2021   HCT 30.8 (L) 07/13/2021  MCV 91.9 07/13/2021   PLT 168 07/13/2021   Recent Labs    04/24/21 1224 06/23/21 1054 07/06/21 0938 07/08/21 1535 07/10/21 0126 07/11/21 0112 07/13/21 0804  NA 136   < > 137  --  137 137 138  K 3.5   < > 3.4*  --  3.4* 3.5 3.6  CL 98   < > 103  --  99 104 105  CO2 28   < > 24  --  _0 GLUCOSE 132*   < > 151*  --  271* 170* 187*  BUN 8   < > 8  --  10 17 6*  CREATININE 0.59   < > 0.74   < > 0.96 0.90 0.62  CALCIUM 8.6*   < > 9.1  --  8.4* 8.5* 8.3*  GFRNONAA >60   < > >60   < > >60 >60 >60  PROT 8.3*  --  7.7  --   --  5.7*  --   ALBUMIN 3.7  --  3.5  --   --  2.5*  --   AST 42*  --  31  --   --  25  --   ALT 19   --  19  --   --  13  --   ALKPHOS 56  --  62  --   --  43  --   BILITOT 0.5  --  0.5  --   --  0.4  --    < > = values in this interval not displayed.    RADIOGRAPHIC STUDIES: I have personally reviewed the radiological images as listed and agreed with the findings in the report. DG Chest 1 View  Result Date: 07/10/2021 CLINICAL DATA:  Status post lobectomy. EXAM: CHEST  1 VIEW COMPARISON:  July 09, 2021. FINDINGS: Stable cardiomediastinal silhouette. Stable position of right-sided chest tube. No definite pneumothorax is noted. Minimal bibasilar subsegmental atelectasis is noted. Bony thorax is unremarkable. IMPRESSION: Stable position of right-sided chest tube. No definite pneumothorax. Minimal bibasilar subsegmental atelectasis. Electronically Signed   By: Marijo Conception M.D.   On: 07/10/2021 10:34   DG Chest 2 View  Result Date: 07/17/2021 CLINICAL DATA:  71 year old female with history of robot assisted right upper lobectomy. Follow-up study. EXAM: CHEST - 2 VIEW COMPARISON:  Chest CT 07/14/2021. FINDINGS: Postoperative changes of right upper lobectomy are noted. Compensatory hyperexpansion of the right middle and lower lobes is noted. Vague opacity in the right mid hemithorax likely reflects some loculated anterior pleural fluid. A small amount of pleural fluid is also noted at the right base blunting the right costophrenic sulcus. Left lung is clear. No definite consolidative airspace disease. No pneumothorax. No evidence of pulmonary edema. Heart size is normal. Upper mediastinal contours are within normal limits. Surgical clips project over the apex of the right hemithorax. Orthopedic fixation hardware in the lower cervical spine incidentally noted. IMPRESSION: 1. Postoperative changes of recent right upper lobectomy with small amount of right-sided pleural fluid, as above. No other acute findings are noted. Electronically Signed   By: Vinnie Langton M.D.   On: 07/17/2021 10:41   DG  Chest 2 View  Result Date: 07/13/2021 CLINICAL DATA:  Chest pain EXAM: CHEST - 2 VIEW COMPARISON:  07/12/2021 FINDINGS: Patchy airspace disease noted right mid and lower lung with probable atelectasis at the left base. Tiny bilateral pleural effusions suspected. Tiny right apical pneumothorax seen on the previous study not clearly  demonstrated on the current film. Similar soft tissue fullness in the right hilar region. The visualized bony structures of the thorax show no acute abnormality. Telemetry leads overlie the chest. IMPRESSION: 1. Interval increase in patchy airspace disease at the right base, likely atelectatic although superimposed infection cannot be excluded. 2. The tiny right apical pneumothorax seen on yesterday's study is not evident on today's exam. 3. Tiny bilateral pleural effusions. Electronically Signed   By: Misty Stanley M.D.   On: 07/13/2021 08:38   DG Chest 2 View  Result Date: 07/06/2021 CLINICAL DATA:  Post thoracoscopy, RIGHT upper lobectomy scheduled for 07/08/2021, history asthma, COPD, diabetes mellitus, hypertension, RIGHT upper lobe mass, former smoker EXAM: CHEST - 2 VIEW COMPARISON:  04/15/2021 FINDINGS: Minimal enlargement of cardiac silhouette. Mediastinal contours and pulmonary vascularity normal. Decreased opacity at RIGHT apex versus prior study, corresponding to hypermetabolic mass identified on a prior PET-CT study. Minimal atelectasis at medial RIGHT lung base. Remaining lungs clear. No pleural effusion or pneumothorax. Prior cervical spine fusion and osseous demineralization noted. IMPRESSION: RIGHT upper lobe mass. Minimal atelectasis at medial RIGHT lung base. Electronically Signed   By: Lavonia Dana M.D.   On: 07/06/2021 16:17   DG Chest 1V REPEAT Same Day  Result Date: 07/12/2021 CLINICAL DATA:  Chest tube removed EXAM: CHEST - 1 VIEW SAME DAY COMPARISON:  Chest radiograph from earlier today. FINDINGS: Stable partially visualized surgical hardware from ACDF.  Surgical clips overlie the right lung apex. Stable cardiomediastinal silhouette with mild cardiomegaly. Small right apical pneumothorax, not substantially changed, approximately 5%. No left pneumothorax. No pleural effusion. No overt pulmonary edema. Hazy mild bibasilar lung opacities, slightly improved. IMPRESSION: 1. Stable small right apical pneumothorax status post right chest tube removal. 2. Hazy mild bibasilar lung opacities, slightly improved, favor atelectasis. Electronically Signed   By: Ilona Sorrel M.D.   On: 07/12/2021 13:19   DG Chest Port 1 View  Result Date: 07/14/2021 CLINICAL DATA:  Chest soreness after surgery EXAM: PORTABLE CHEST 1 VIEW COMPARISON:  07/13/2021, 07/12/2021 FINDINGS: Stable cardiomediastinal contours. Postsurgical changes from right upper lobectomy. No discernible pneumothorax. Continued worsening of patchy airspace opacity in the right lower lobe. Probable small right pleural effusion. IMPRESSION: 1. Continued worsening of patchy airspace opacity in the right lower lobe, concerning for pneumonia. 2. Probable small right pleural effusion. Electronically Signed   By: Davina Poke D.O.   On: 07/14/2021 09:02   DG Chest Port 1 View  Result Date: 07/12/2021 CLINICAL DATA:  70 year old female status post lobectomy of the lung. EXAM: PORTABLE CHEST 1 VIEW COMPARISON:  Chest x-ray 07/11/2021. FINDINGS: Postoperative changes of right upper lobectomy are again noted. Right-sided chest tube stable in position with tip near the apex of the right hemithorax. Patchy areas of interstitial prominence an ill-defined opacities are noted in the right mid to lower lung, as well as in the left lung base. Small pneumothorax in the apex of the right hemithorax (less than 5% of the volume of the right hemithorax) is noted. No left pneumothorax. No evidence of pulmonary edema. Heart size is normal. The patient is rotated to the right on today's exam, resulting in distortion of the mediastinal  contours and reduced diagnostic sensitivity and specificity for mediastinal pathology. Orthopedic fixation hardware in the lower cervical spine incidentally noted. IMPRESSION: 1. Postoperative changes and support apparatus, as above. 2. Small right apical pneumothorax occupying less than 5% of the volume of the right hemithorax. 3. Slight worsening of aeration in the mid to  lower lungs bilaterally (right greater than left), which could reflect areas of atelectasis and/or developing airspace consolidation. Electronically Signed   By: Vinnie Langton M.D.   On: 07/12/2021 08:36   DG Chest Port 1 View  Result Date: 07/11/2021 CLINICAL DATA:  Postoperative EXAM: PORTABLE CHEST 1 VIEW COMPARISON:  Chest radiograph from one day prior. FINDINGS: Stable right apical chest tube, right apical surgical clips and surgical hardware from ACDF. Stable cardiomediastinal silhouette with top-normal heart size. No pneumothorax. No pleural effusion. Mild streaky bibasilar scarring versus atelectasis, similar. No overt pulmonary edema. IMPRESSION: 1. No pneumothorax. Stable right apical chest tube. 2. Stable mild streaky bibasilar scarring versus atelectasis. Electronically Signed   By: Ilona Sorrel M.D.   On: 07/11/2021 10:04   DG Chest Port 1 View  Result Date: 07/09/2021 CLINICAL DATA:  Pneumothorax, s/p right upper lobectomy EXAM: PORTABLE CHEST 1 VIEW COMPARISON:  Chest radiograph 07/06/2021 FINDINGS: A right chest tube is in place terminating in the right apex. The cardiomediastinal silhouette is stable. There has been interval right upper lobectomy. No definite pneumothorax is identified. The left lung is clear. There is no pleural effusion. There is no left pneumothorax. The bones are stable. IMPRESSION: Interval right upper lobectomy with right apical chest tube placement; no definite pneumothorax is identified. Electronically Signed   By: Valetta Mole M.D.   On: 07/09/2021 15:17   Myocardial Perfusion  Imaging  Result Date: 07/01/2021   The study is normal. The study is low risk.   No ST deviation was noted.   LV perfusion is normal. There is no evidence of ischemia. There is no evidence of infarction.   Left ventricular function is normal. Nuclear stress EF: 68 %. The left ventricular ejection fraction is hyperdynamic (>65%). End diastolic cavity size is normal.   Prior study not available for comparison. Normal study without evidence of ischemia or infarction. Normal LVEF, >65%. This is a low risk study.    ASSESSMENT & PLAN:   Malignant neoplasm of upper lobe of right lung ([place]) # pT3pN0 [4.5cm; Squamous cell ca- + involvement of the parietal pleura]; stage II.  # I discussed the risk of recurrence-for stage II lung cancer-in the range of 30 to 40%; benefit from adjuvant chemotherapy.  Discussed that the relative benefit from platinum based doublet chemotherapy is about 8 to 10%.  Based on PD-L1 status benefit from immunotherapy.  Patient also candidate for clinical trial-we will screen patient.   # Patient is not a good candidate for cisplatin based therapy.  I would recommend carboplatin and Taxol.   Discussed the potential side effects including but not limited to-increasing fatigue, nausea vomiting, diarrhea, hair loss, sores in the mouth, increase risk of infection and also neuropathy.   # chronic back pain/chronic left LE/Bil PN-history of cervical spine injury-no evidence of metastatic disease.  Continue hydrocodone [for now as per Dr. Doy Hutching.   #History of diabetes-diet controlled.  Will need to be monitored closely on chemotherapy/steroids.  #URI sinus pressure left ear ache -otitis media.  Recommend  amoxicllin 500 TID  # DISPOSITION: # chemo education- AGAIN- carbo-taxol; D-2 undeyca # port placement refer to IR ASAP # follow up in 3 weeks- MD; labs- cbc/cmp- Carbo-taxol [new]; D-2 - Dr.B  # 40 minutes face-to-face with the patient discussing the above plan of care; more  than 50% of time spent on prognosis/ natural history; counseling and coordination.  Cc: Dr.A/Dr.Sparks     All questions were answered. The patient knows to call  the clinic with any problems, questions or concerns.       Cammie Sickle, MD 07/23/2021 7:13 PM

## 2021-07-23 NOTE — Progress Notes (Signed)
Tumor Board Documentation  Olivia Buchanan Olivia Buchanan was presented by Dr Tasia Catchings at our Tumor Board on 07/23/2021, which included representatives from medical oncology, radiation oncology, internal medicine, navigation, pathology, radiology, surgical, genetics, research, pulmonology.  Olivia Buchanan currently presents as a current patient, for [place], for new positive pathology with history of the following treatments: surgical intervention(s), active survellience.  Additionally, we reviewed previous medical and familial history, history of present illness, and recent lab results along with all available histopathologic and imaging studies. The tumor board considered available treatment options and made the following recommendations: Immunotherapy, Adjuvant chemotherapy    The following procedures/referrals were also placed: No orders of the defined types were placed in this encounter.   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  Tumor board is a meeting of clinicians from various specialty areas who evaluate and discuss patients for whom a multidisciplinary approach is being considered. Final determinations in the plan of care are those of the provider(s). The responsibility for follow up of recommendations given during tumor board is that of the provider.   Today's extended care, comprehensive team conference, Olivia Buchanan was not present for the discussion and was not examined.   Multidisciplinary Tumor Board is a multidisciplinary case peer review process.  Decisions discussed in the Multidisciplinary Tumor Board reflect the opinions of the specialists present at the conference without having examined the patient.  Ultimately, treatment and diagnostic decisions rest with the primary provider(s) and the patient.

## 2021-07-23 NOTE — Assessment & Plan Note (Addendum)
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  # I discussed the risk of recurrence-for stage II lung cancer-in the range of 30 to 40%; benefit from adjuvant chemotherapy.  Discussed that the relative benefit from platinum based doublet chemotherapy is about 8 to 10%.  Based on PD-L1 status benefit from immunotherapy.  Patient also candidate for clinical trial-we will screen patient.   # Patient is not a good candidate for cisplatin based therapy.  I would recommend carboplatin and Taxol.   Discussed the potential side effects including but not limited to-increasing fatigue, nausea vomiting, diarrhea, hair loss, sores in the mouth, increase risk of infection and also neuropathy.   # chronic back pain/chronic left LE/Bil PN-history of cervical spine injury-no evidence of metastatic disease.  Continue hydrocodone [for now as per Dr. Doy Hutching.   #History of diabetes-diet controlled.  Will need to be monitored closely on chemotherapy/steroids.  #URI sinus pressure left ear ache -otitis media.  Recommend  amoxicllin 500 TID  # DISPOSITION: # chemo education- AGAIN- carbo-taxol; D-2 undeyca # port placement refer to IR ASAP # follow up in 3 weeks- MD; labs- cbc/cmp- Carbo-taxol [new]; D-2 - Dr.B  # 40 minutes face-to-face with the patient discussing the above plan of care; more than 50% of time spent on prognosis/ natural history; counseling and coordination.  Cc: Dr.A/Dr.Sparks

## 2021-07-23 NOTE — Telephone Encounter (Signed)
Port referral sent to scheduling

## 2021-07-23 NOTE — Progress Notes (Signed)
Patient here for oncology follow-up appointment,  concerns of back pain, ear aches, and SOB

## 2021-07-23 NOTE — Progress Notes (Signed)
START ON PATHWAY REGIMEN - Non-Small Cell Lung     A cycle is every 21 days:     Paclitaxel      Carboplatin   **Always confirm dose/schedule in your pharmacy ordering system**  Patient Characteristics: Postoperative without Neoadjuvant Therapy (Pathologic Staging), Stage IIB, Adjuvant Chemotherapy, Squamous Cell Therapeutic Status: Postoperative without Neoadjuvant Therapy (Pathologic Staging) AJCC T Category: pT3 AJCC N Category: pN0 AJCC M Category: cM0 AJCC 8 Stage Grouping: IIB Histology: Squamous Cell Intent of Therapy: Curative Intent, Discussed with Patient

## 2021-07-23 NOTE — Progress Notes (Signed)
The proposed treatment discussed in conference is for discussion purpose only and is not a binding recommendation. The patient was not been physically examined, or presented with their treatment options. Therefore, final treatment plans cannot be decided.  

## 2021-07-24 ENCOUNTER — Telehealth: Payer: Self-pay | Admitting: *Deleted

## 2021-07-24 NOTE — Telephone Encounter (Signed)
Port a cath set up for Thursday 07/30/21- 1230 pm arrival. Pt will need to be NPO 6-8 hours prior to the procedure. I attempted to reach patient. No answer. I left a detailed vm requesting patient to call our office back to review port placement details.  Colette/Hayley- if patient calls back- will you review the details of this apt.

## 2021-07-29 ENCOUNTER — Other Ambulatory Visit: Payer: Self-pay | Admitting: Radiology

## 2021-07-30 ENCOUNTER — Ambulatory Visit: Admission: RE | Admit: 2021-07-30 | Payer: Medicare HMO | Source: Ambulatory Visit | Admitting: Radiology

## 2021-07-31 ENCOUNTER — Telehealth: Payer: Self-pay | Admitting: *Deleted

## 2021-07-31 NOTE — Telephone Encounter (Signed)
Left message with patient to inform of new appt date/time for port placement. Pt informed that port placement is scheduled for Thurs 11/2 at 9:30a and to arrive at the [place] at 8:30am. Instructed pt to call back with any questions.

## 2021-08-03 NOTE — Progress Notes (Signed)
Patient on schedule for Port placement 08/06/2021, called and left message on phone since no answer,.made aware to be here @ 0830, NPO after MN prior to procedure, and driver post procedure/recovery/discharge. Encouraged on message for patient to call office back for details and clarification of instructions when able to do so.

## 2021-08-04 NOTE — Progress Notes (Signed)
Patient left voice message and I called and spoke with patient regarding scheduled procedure of port-a-catheter placement for Thursday 08/06/2021.  Patient asked to confirm time to be at [facility] and time of procedure and I let her know to be at [facility] registration at 0830 for procedure scheduled for 0930.  Confirmed NPO after midnight before procedure and she verbalized understanding.  She also states her husband will be accompanying her to procedure.  She had no further questions and I encouraged her to call back if questions arise. answered.

## 2021-08-05 ENCOUNTER — Other Ambulatory Visit: Payer: Self-pay | Admitting: Radiology

## 2021-08-06 ENCOUNTER — Ambulatory Visit
Admission: RE | Admit: 2021-08-06 | Discharge: 2021-08-06 | Disposition: A | Discharge status: Home / Self Care | Payer: Medicare HMO | Source: Ambulatory Visit | Attending: Internal Medicine | Admitting: Internal Medicine

## 2021-08-06 ENCOUNTER — Other Ambulatory Visit: Payer: Self-pay

## 2021-08-06 ENCOUNTER — Encounter: Payer: Self-pay | Admitting: Radiology

## 2021-08-06 ENCOUNTER — Other Ambulatory Visit: Payer: Self-pay | Admitting: *Deleted

## 2021-08-06 ENCOUNTER — Other Ambulatory Visit: Payer: Self-pay | Admitting: Internal Medicine

## 2021-08-06 DIAGNOSIS — Z886 Allergy status to analgesic agent status: Secondary | ICD-10-CM | POA: Insufficient documentation

## 2021-08-06 DIAGNOSIS — C3411 Malignant neoplasm of upper lobe, right bronchus or lung: Secondary | ICD-10-CM | POA: Insufficient documentation

## 2021-08-06 DIAGNOSIS — Z452 Encounter for adjustment and management of vascular access device: Secondary | ICD-10-CM | POA: Diagnosis not present

## 2021-08-06 DIAGNOSIS — Z79899 Other long term (current) drug therapy: Secondary | ICD-10-CM | POA: Insufficient documentation

## 2021-08-06 DIAGNOSIS — C3490 Malignant neoplasm of unspecified part of unspecified bronchus or lung: Secondary | ICD-10-CM

## 2021-08-06 DIAGNOSIS — Z7982 Long term (current) use of aspirin: Secondary | ICD-10-CM | POA: Diagnosis not present

## 2021-08-06 DIAGNOSIS — Z87891 Personal history of nicotine dependence: Secondary | ICD-10-CM | POA: Diagnosis not present

## 2021-08-06 HISTORY — PX: IR IMAGING GUIDED PORT INSERTION: IMG5740

## 2021-08-06 LAB — GLUCOSE, CAPILLARY
Glucose-Capillary: 183 mg/dL — ABNORMAL HIGH (ref 70–99)
Glucose-Capillary: 160 mg/dL — ABNORMAL HIGH (ref 70–99)

## 2021-08-06 MED ORDER — MIDAZOLAM HCL 2 MG/2ML IJ SOLN
INTRAMUSCULAR | Status: AC
  Filled 2021-08-06: qty 2

## 2021-08-06 MED ORDER — HEPARIN SOD (PORK) LOCK FLUSH 100 UNIT/ML IV SOLN
INTRAVENOUS | Status: AC
  Administered 2021-08-06: 500 [IU]
  Filled 2021-08-06: qty 5

## 2021-08-06 MED ORDER — MIDAZOLAM HCL 2 MG/2ML IJ SOLN
INTRAMUSCULAR | Status: AC | PRN
Start: 2021-08-06 — End: 2021-08-06
  Administered 2021-08-06: 1 mg via INTRAVENOUS

## 2021-08-06 MED ORDER — LIDOCAINE HCL 1 % IJ SOLN
INTRAMUSCULAR | Status: AC
  Administered 2021-08-06: 14 mL
  Filled 2021-08-06: qty 20

## 2021-08-06 MED ORDER — SODIUM CHLORIDE 0.9 % IV SOLN
INTRAVENOUS | Status: DC
  Filled 2021-08-06: qty 1000

## 2021-08-06 MED ORDER — FENTANYL CITRATE (PF) 100 MCG/2ML IJ SOLN
INTRAMUSCULAR | Status: AC
  Filled 2021-08-06: qty 2

## 2021-08-06 MED ORDER — FENTANYL CITRATE (PF) 100 MCG/2ML IJ SOLN
INTRAMUSCULAR | Status: AC | PRN
  Administered 2021-08-06: 50 ug via INTRAVENOUS

## 2021-08-06 NOTE — Progress Notes (Signed)
Pharmacist Chemotherapy Monitoring - Initial Assessment    Anticipated start date: 08/13/21   The following has been reviewed per standard work regarding the patient's treatment regimen: The patient's diagnosis, treatment plan and drug doses, and organ/hematologic function Lab orders and baseline tests specific to treatment regimen  The treatment plan start date, drug sequencing, and pre-medications Prior authorization status  Patient's documented medication list, including drug-drug interaction screen and prescriptions for anti-emetics and supportive care specific to the treatment regimen The drug concentrations, fluid compatibility, administration routes, and timing of the medications to be used The patient's access for treatment and lifetime cumulative dose history, if applicable  The patient's medication allergies and previous infusion related reactions, if applicable   Changes made to treatment plan:  treatment plan date  Follow up needed:  dose adjustment of carbo due to dosing and signing treatment plan   Adelina Mings, [place], 08/06/2021  3:06 PM

## 2021-08-06 NOTE — H&P (Signed)
Chief Complaint: Patient was seen in consultation today for port placement at the request of Brahmanday,Govinda R  Referring Physician(s): Brahmanday,Govinda R  Patient Status: [facility] - Out-pt  History of Present Illness: Olivia Buchanan is a 69 y.o. female with a history of squamous lung carcinoma, status post right upper lobectomy on 07/09/2021. Path:  A. LYMPH NODE, HILAR, EXCISION:  - Lymph node negative for metastatic carcinoma.   B. LYMPH NODE, LEVEL 4, EXCISION:  - Lymph node negative for metastatic carcinoma.   C. LUNG, RIGHT UPPER LOBE, LOBECTOMY:  - Squamous cell carcinoma, 4.5 cm.  - Carcinoma extends through visceral pleura and focally involves  parietal pleura.  - Surgical margins negative for carcinoma.  - One lymph node negative for carcinoma.  - See oncology table and comment.   D. SOFT TISSUE, CHEST WALL, BIOPSY:  - Fibroadipose tissue with focal inflammation and fibrosis.  - No carcinoma identified.   Here for port placement at the recommendation of Dr. Rogue Bussing for post-op chemotherapy. Still having some post thoracotomy pain. No dyspnea or fever.  Past Medical History:  Diagnosis Date   Angioedema 12/08/2019   Anxiety    Aortic atherosclerosis (HCC)    Asthma    CAD (coronary artery disease)    3 vessel   Cancer (HCC)    COPD (chronic obstructive pulmonary disease) ([place]) 12/08/2019   DDD (degenerative disc disease), lumbar    Depression    Diabetes mellitus without complication (HCC)    no meds, diet controlled   Dyspnea    GERD (gastroesophageal reflux disease)    HTN (hypertension) 12/08/2019   Hyperlipidemia    Hypertension    Mass of upper lobe of right lung 04/02/2021   spiculated mass RIGHT posterior pulmonary apex with associated hilar and pretracheal LAD; measured 3.2 x 2.8 cm   Obesity (BMI 30-39.9)    T2DM (type 2 diabetes mellitus) ([place]) 12/08/2019    Past Surgical History:  Procedure Laterality Date   BACK SURGERY      lumbar L5-S1 ruptured disc x 3 surgeries   CERVICAL FUSION  2009   FINE NEEDLE ASPIRATION  06/23/2021   Procedure: FINE NEEDLE ASPIRATION (FNA) LINEAR;  Surgeon: Garner Nash, DO;  Location: [facility];  Service: Pulmonary;;   INTERCOSTAL NERVE BLOCK Right 07/09/2021   Procedure: INTERCOSTAL NERVE BLOCK;  Surgeon: Lajuana Matte, MD;  Location: [place];  Service: Thoracic;  Laterality: Right;   [place]   with oophorectomy   LYMPH NODE DISSECTION Right 07/09/2021   Procedure: LYMPH NODE DISSECTION;  Surgeon: Lajuana Matte, MD;  Location: [facility];  Service: Thoracic;  Laterality: Right;   TONSILLECTOMY     VIDEO BRONCHOSCOPY WITH ENDOBRONCHIAL NAVIGATION N/A 04/15/2021   Procedure: VIDEO BRONCHOSCOPY WITH ENDOBRONCHIAL NAVIGATION;  Surgeon: Ottie Glazier, MD;  Location: [facility];  Service: Thoracic;  Laterality: N/A;   VIDEO BRONCHOSCOPY WITH ENDOBRONCHIAL ULTRASOUND N/A 04/15/2021   Procedure: VIDEO BRONCHOSCOPY WITH ENDOBRONCHIAL ULTRASOUND;  Surgeon: Ottie Glazier, MD;  Location: [facility];  Service: Thoracic;  Laterality: N/A;   VIDEO BRONCHOSCOPY WITH ENDOBRONCHIAL ULTRASOUND N/A 06/23/2021   Procedure: VIDEO BRONCHOSCOPY WITH ENDOBRONCHIAL ULTRASOUND;  Surgeon: Garner Nash, DO;  Location: [place];  Service: Pulmonary;  Laterality: N/A;    Allergies: Ibuprofen and Macrobid [nitrofurantoin]  Medications: Prior to Admission medications   Medication Sig Start Date End Date Taking? Authorizing Provider  albuterol (VENTOLIN HFA) 108 (90 Base) MCG/ACT inhaler Inhale 1 puff into the lungs  every 6 (six) hours as needed for wheezing. 09/27/19  Yes [provider]  amoxicillin (AMOXIL) 500 MG capsule Take 1 capsule (500 mg total) by mouth 3 (three) times daily. 07/23/21  Yes Cammie Sickle, MD  Ascorbic Acid (VITAMIN C) 1000 MG tablet Take 1,000 mg by mouth in the morning and at bedtime.   Yes [provider]  aspirin  EC 81 MG tablet Take 81 mg by mouth at bedtime.   Yes [provider]  bisoprolol-hydrochlorothiazide (ZIAC) 5-6.25 MG tablet Take 1 tablet by mouth daily. 03/20/21  Yes [provider]  cetirizine-pseudoephedrine (ZYRTEC-D) 5-120 MG tablet Take 1 tablet by mouth at bedtime.   Yes [provider]  clonazePAM (KLONOPIN) 0.5 MG tablet Take 0.5 mg by mouth 3 (three) times daily as needed for anxiety. 11/14/19  Yes [provider]  docusate sodium (COLACE) 100 MG capsule Take 200 mg by mouth at bedtime.   Yes [provider]  fluticasone-salmeterol (ADVAIR) 250-50 MCG/ACT AEPB Inhale 1 puff into the lungs in the morning and at bedtime.   Yes [provider]  gabapentin (NEURONTIN) 300 MG capsule Take 300 mg by mouth 3 (three) times daily. 11/14/19  Yes [provider]  hydrochlorothiazide (HYDRODIURIL) 25 MG tablet Take 25 mg by mouth daily. 11/14/19  Yes [provider]  ipratropium-albuterol (DUONEB) 0.5-2.5 (3) MG/3ML SOLN Take 3 mLs by nebulization 3 (three) times daily.   Yes [provider]  Melatonin 10 MG TABS Take 10 mg by mouth at bedtime.   Yes [provider]  Multiple Vitamin (MULTI-VITAMIN) tablet Take 1 tablet by mouth at bedtime.   Yes [provider]  nystatin cream (MYCOSTATIN) Apply 1 application topically 2 (two) times daily as needed for dry skin (around lips).   Yes [provider]  oxyCODONE (OXY IR/ROXICODONE) 5 MG immediate release tablet Take 1 tablet (5 mg total) by mouth every 6 (six) hours as needed for moderate pain or severe pain. 07/14/21  Yes Lars Pinks M, PA-C  pantoprazole (PROTONIX) 40 MG tablet Take 40 mg by mouth at bedtime.  11/14/19  Yes [provider]  potassium chloride SA (KLOR-CON) 20 MEQ tablet Take 20 mEq by mouth 2 (two) times daily. 11/14/19  Yes [provider]  pravastatin (PRAVACHOL) 80 MG tablet Take 80 mg by mouth at bedtime.   11/14/19  Yes [provider]  Probiotic Product (PROBIOTIC DAILY PO) Take 1 capsule by mouth daily.   Yes [provider]  tiZANidine (ZANAFLEX) 4 MG tablet Take 4 mg by mouth 3 (three) times daily. 11/14/19  Yes [provider]  venlafaxine XR (EFFEXOR-XR) 75 MG 24 hr capsule Take 150 mg by mouth at bedtime. 11/14/19  Yes [provider]  clonazePAM (KLONOPIN) 0.5 MG tablet Take 1 tablet by mouth 3 (three) times daily. Patient not taking: Reported on 07/23/2021 07/16/21   [provider]  EPINEPHrine 0.3 mg/0.3 mL IJ SOAJ injection Inject 0.3 mLs (0.3 mg total) into the muscle as needed for anaphylaxis. 12/08/19   Jennye Boroughs, MD  ondansetron (ZOFRAN) 8 MG tablet TAKE 1 TABLET BY MOUTH EVERY 8 HOURS AS NEEDED FOR NAUSEA OR VOMITING 06/18/21   Cammie Sickle, MD     Family History  Problem Relation Age of Onset   Bone cancer Maternal Uncle     Social History   Socioeconomic History   Marital status: Married    Spouse name: Orpah Greek   Number of children: 3  Years of education: Not on file   Highest education level: Not on file  Occupational History   Not on file  Tobacco Use   Smoking status: Former    Packs/day: 3.00    Years: 50.00    Pack years: 150.00    Types: Cigarettes    Quit date: 2019    Years since quitting: 3.8   Smokeless tobacco: Never  Vaping Use   Vaping Use: Never used  Substance and Sexual Activity   Alcohol use: Not Currently   Drug use: Never   Sexual activity: Not on file    Comment: Hysterectomy  Other Topics Concern   Not on file  Social History Narrative   Lives in home; with husband; lives in [place]; never smoking 2019; no alcohol. 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    Social Determinants of Health   Financial Resource Strain: Not on file  Food Insecurity: Not on file  Transportation Needs: Not on file  Physical Activity: Not on file  Stress: Not on file  Social  Connections: Not on file    ECOG Status: 1 - Symptomatic but completely ambulatory  Review of Systems: A 12 point ROS discussed and pertinent positives are indicated in the HPI above.  All other systems are negative.  Review of Systems  Constitutional: Negative.   Respiratory: Negative.    Cardiovascular:  Positive for chest pain. Negative for palpitations and leg swelling.  Gastrointestinal: Negative.   Genitourinary: Negative.   Musculoskeletal: Negative.   Neurological: Negative.    Vital Signs: BP 104/61   Pulse 63   Temp 97.9 F (36.6 C) (Oral)   Resp 18   Ht 4\' 11"  (1.499 m)   Wt 84.8 kg   SpO2 94%   BMI 37.77 kg/m   Physical Exam Vitals reviewed.  Constitutional:      General: She is not in acute distress.    Appearance: She is not toxic-appearing or diaphoretic.  HENT:     Head: Normocephalic and atraumatic.  Cardiovascular:     Rate and Rhythm: Normal rate and regular rhythm.     Heart sounds: Normal heart sounds. No murmur heard.   No friction rub. No gallop.  Pulmonary:     Effort: Pulmonary effort is normal. No respiratory distress.     Breath sounds: Normal breath sounds. No stridor. No wheezing, rhonchi or rales.  Abdominal:     General: There is no distension.     Palpations: Abdomen is soft.     Tenderness: There is no abdominal tenderness. There is no guarding or rebound.  Musculoskeletal:        General: No swelling.     Cervical back: Neck supple.  Skin:    General: Skin is warm and dry.  Neurological:     General: No focal deficit present.     Mental Status: She is alert and oriented to person, place, and time.    Imaging: DG Chest 1 View  Result Date: 07/10/2021 CLINICAL DATA:  Status post lobectomy. EXAM: CHEST  1 VIEW COMPARISON:  July 09, 2021. FINDINGS: Stable cardiomediastinal silhouette. Stable position of right-sided chest tube. No definite pneumothorax is noted. Minimal bibasilar subsegmental atelectasis is noted. Bony thorax  is unremarkable. IMPRESSION: Stable position of right-sided chest tube. No definite pneumothorax. Minimal bibasilar subsegmental atelectasis. Electronically Signed   By: Marijo Conception M.D.   On: 07/10/2021 10:34   DG Chest 2 View  Result Date: 07/17/2021 CLINICAL DATA:  70 year old  female with history of robot assisted right upper lobectomy. Follow-up study. EXAM: CHEST - 2 VIEW COMPARISON:  Chest CT 07/14/2021. FINDINGS: Postoperative changes of right upper lobectomy are noted. Compensatory hyperexpansion of the right middle and lower lobes is noted. Vague opacity in the right mid hemithorax likely reflects some loculated anterior pleural fluid. A small amount of pleural fluid is also noted at the right base blunting the right costophrenic sulcus. Left lung is clear. No definite consolidative airspace disease. No pneumothorax. No evidence of pulmonary edema. Heart size is normal. Upper mediastinal contours are within normal limits. Surgical clips project over the apex of the right hemithorax. Orthopedic fixation hardware in the lower cervical spine incidentally noted. IMPRESSION: 1. Postoperative changes of recent right upper lobectomy with small amount of right-sided pleural fluid, as above. No other acute findings are noted. Electronically Signed   By: Vinnie Langton M.D.   On: 07/17/2021 10:41   DG Chest 2 View  Result Date: 07/13/2021 CLINICAL DATA:  Chest pain EXAM: CHEST - 2 VIEW COMPARISON:  07/12/2021 FINDINGS: Patchy airspace disease noted right mid and lower lung with probable atelectasis at the left base. Tiny bilateral pleural effusions suspected. Tiny right apical pneumothorax seen on the previous study not clearly demonstrated on the current film. Similar soft tissue fullness in the right hilar region. The visualized bony structures of the thorax show no acute abnormality. Telemetry leads overlie the chest. IMPRESSION: 1. Interval increase in patchy airspace disease at the right base,  likely atelectatic although superimposed infection cannot be excluded. 2. The tiny right apical pneumothorax seen on yesterday's study is not evident on today's exam. 3. Tiny bilateral pleural effusions. Electronically Signed   By: Misty Stanley M.D.   On: 07/13/2021 08:38   DG Chest 1V REPEAT Same Day  Result Date: 07/12/2021 CLINICAL DATA:  Chest tube removed EXAM: CHEST - 1 VIEW SAME DAY COMPARISON:  Chest radiograph from earlier today. FINDINGS: Stable partially visualized surgical hardware from ACDF. Surgical clips overlie the right lung apex. Stable cardiomediastinal silhouette with mild cardiomegaly. Small right apical pneumothorax, not substantially changed, approximately 5%. No left pneumothorax. No pleural effusion. No overt pulmonary edema. Hazy mild bibasilar lung opacities, slightly improved. IMPRESSION: 1. Stable small right apical pneumothorax status post right chest tube removal. 2. Hazy mild bibasilar lung opacities, slightly improved, favor atelectasis. Electronically Signed   By: Ilona Sorrel M.D.   On: 07/12/2021 13:19   DG Chest Port 1 View  Result Date: 07/14/2021 CLINICAL DATA:  Chest soreness after surgery EXAM: PORTABLE CHEST 1 VIEW COMPARISON:  07/13/2021, 07/12/2021 FINDINGS: Stable cardiomediastinal contours. Postsurgical changes from right upper lobectomy. No discernible pneumothorax. Continued worsening of patchy airspace opacity in the right lower lobe. Probable small right pleural effusion. IMPRESSION: 1. Continued worsening of patchy airspace opacity in the right lower lobe, concerning for pneumonia. 2. Probable small right pleural effusion. Electronically Signed   By: Davina Poke D.O.   On: 07/14/2021 09:02   DG Chest Port 1 View  Result Date: 07/12/2021 CLINICAL DATA:  70 year old female status post lobectomy of the lung. EXAM: PORTABLE CHEST 1 VIEW COMPARISON:  Chest x-ray 07/11/2021. FINDINGS: Postoperative changes of right upper lobectomy are again noted.  Right-sided chest tube stable in position with tip near the apex of the right hemithorax. Patchy areas of interstitial prominence an ill-defined opacities are noted in the right mid to lower lung, as well as in the left lung base. Small pneumothorax in the apex of the right hemithorax (  less than 5% of the volume of the right hemithorax) is noted. No left pneumothorax. No evidence of pulmonary edema. Heart size is normal. The patient is rotated to the right on today's exam, resulting in distortion of the mediastinal contours and reduced diagnostic sensitivity and specificity for mediastinal pathology. Orthopedic fixation hardware in the lower cervical spine incidentally noted. IMPRESSION: 1. Postoperative changes and support apparatus, as above. 2. Small right apical pneumothorax occupying less than 5% of the volume of the right hemithorax. 3. Slight worsening of aeration in the mid to lower lungs bilaterally (right greater than left), which could reflect areas of atelectasis and/or developing airspace consolidation. Electronically Signed   By: Vinnie Langton M.D.   On: 07/12/2021 08:36   DG Chest Port 1 View  Result Date: 07/11/2021 CLINICAL DATA:  Postoperative EXAM: PORTABLE CHEST 1 VIEW COMPARISON:  Chest radiograph from one day prior. FINDINGS: Stable right apical chest tube, right apical surgical clips and surgical hardware from ACDF. Stable cardiomediastinal silhouette with top-normal heart size. No pneumothorax. No pleural effusion. Mild streaky bibasilar scarring versus atelectasis, similar. No overt pulmonary edema. IMPRESSION: 1. No pneumothorax. Stable right apical chest tube. 2. Stable mild streaky bibasilar scarring versus atelectasis. Electronically Signed   By: Ilona Sorrel M.D.   On: 07/11/2021 10:04   DG Chest Port 1 View  Result Date: 07/09/2021 CLINICAL DATA:  Pneumothorax, s/p right upper lobectomy EXAM: PORTABLE CHEST 1 VIEW COMPARISON:  Chest radiograph 07/06/2021 FINDINGS: A right  chest tube is in place terminating in the right apex. The cardiomediastinal silhouette is stable. There has been interval right upper lobectomy. No definite pneumothorax is identified. The left lung is clear. There is no pleural effusion. There is no left pneumothorax. The bones are stable. IMPRESSION: Interval right upper lobectomy with right apical chest tube placement; no definite pneumothorax is identified. Electronically Signed   By: Valetta Mole M.D.   On: 07/09/2021 15:17    Labs:  CBC: Recent Labs    07/08/21 1535 07/10/21 0126 07/11/21 0112 07/13/21 0804  WBC 6.8 8.9 8.7 7.7  HGB 11.7* 10.5* 8.9* 10.1*  HCT 35.1* 31.6* 28.0* 30.8*  PLT 193 165 143* 168    COAGS: Recent Labs    07/06/21 0938  INR 1.0  APTT 27    BMP: Recent Labs    07/06/21 0938 07/08/21 1535 07/10/21 0126 07/11/21 0112 07/13/21 0804  NA 137  --  137 137 138  K 3.4*  --  3.4* 3.5 3.6  CL 103  --  99 104 105  CO2 24  --  29 27 25   GLUCOSE 151*  --  271* 170* 187*  BUN 8  --  10 17 6*  CALCIUM 9.1  --  8.4* 8.5* 8.3*  CREATININE 0.74 0.75 0.96 0.90 0.62  GFRNONAA >60 >60 >60 >60 >60    LIVER FUNCTION TESTS: Recent Labs    04/24/21 1224 07/06/21 0938 07/11/21 0112  BILITOT 0.5 0.5 0.4  AST 42* 31 25  ALT 19 19 13   ALKPHOS 56 62 43  PROT 8.3* 7.7 5.7*  ALBUMIN 3.7 3.5 2.5*     Assessment and Plan:  For porta-cath placement today. Risks and benefits of image guided port-a-catheter placement was discussed with the patient including, but not limited to bleeding, infection, pneumothorax, or fibrin sheath development and need for additional procedures. All of the patient's questions were answered, patient is agreeable to proceed. Consent signed and in chart.   Thank you for this interesting  consult.  I greatly enjoyed meeting Olivia Buchanan and look forward to participating in their care.  A copy of this report was sent to the requesting provider on this date.  Electronically  Signed: Azzie Roup, MD 08/06/2021, 9:50 AM    I spent a total of  15 Minutes  in face to face in clinical consultation, greater than 50% of which was counseling/coordinating care for port placement.

## 2021-08-06 NOTE — Procedures (Signed)
Interventional Radiology Procedure Note  Procedure: Single Lumen Power Port Placement    Access:  Right IJ vein.  Findings: Catheter tip positioned at SVC/RA junction. Port is ready for immediate use.   Complications: None  EBL: < 10 mL  Recommendations:  - Ok to shower in 24 hours - Do not submerge for 7 days - Routine line care   Elaria Osias T. Lille Karim, M.D Pager:  [phone]   

## 2021-08-07 ENCOUNTER — Other Ambulatory Visit: Payer: Self-pay | Admitting: Internal Medicine

## 2021-08-07 ENCOUNTER — Telehealth: Payer: Self-pay | Admitting: *Deleted

## 2021-08-07 DIAGNOSIS — D491 Neoplasm of unspecified behavior of respiratory system: Secondary | ICD-10-CM

## 2021-08-07 MED ORDER — LIDOCAINE-PRILOCAINE 2.5-2.5 % EX CREA: TOPICAL_CREAM | CUTANEOUS | 3 refills | Status: DC

## 2021-08-07 MED ORDER — DEXAMETHASONE 4 MG PO TABS: 8.0000 mg | ORAL_TABLET | Freq: Every day | ORAL | 1 refills | Status: DC

## 2021-08-07 MED ORDER — LORAZEPAM 0.5 MG PO TABS: 0.5000 mg | ORAL_TABLET | Freq: Four times a day (QID) | ORAL | 0 refills | Status: DC | PRN

## 2021-08-07 MED ORDER — PROCHLORPERAZINE MALEATE 10 MG PO TABS: 10.0000 mg | ORAL_TABLET | Freq: Four times a day (QID) | ORAL | 1 refills | Status: DC | PRN

## 2021-08-07 NOTE — Telephone Encounter (Signed)
Pt called in to get prescriptions sent into pharmacy since will be starting chemo next week. Take home meds released from treatment plan except ondansetron due to pt preference.

## 2021-08-13 ENCOUNTER — Inpatient Hospital Stay: Payer: Medicare HMO | Admitting: Internal Medicine

## 2021-08-13 ENCOUNTER — Encounter: Payer: Self-pay | Admitting: *Deleted

## 2021-08-13 ENCOUNTER — Inpatient Hospital Stay: Payer: Medicare HMO

## 2021-08-13 ENCOUNTER — Inpatient Hospital Stay: Payer: Medicare HMO | Attending: Internal Medicine

## 2021-08-13 ENCOUNTER — Encounter: Payer: Self-pay | Admitting: Internal Medicine

## 2021-08-13 ENCOUNTER — Other Ambulatory Visit: Payer: Self-pay

## 2021-08-13 VITALS — BP 104/50 | HR 64 | Temp 97.3°F | Resp 18

## 2021-08-13 DIAGNOSIS — Z5111 Encounter for antineoplastic chemotherapy: Secondary | ICD-10-CM | POA: Insufficient documentation

## 2021-08-13 DIAGNOSIS — C3411 Malignant neoplasm of upper lobe, right bronchus or lung: Secondary | ICD-10-CM | POA: Diagnosis not present

## 2021-08-13 DIAGNOSIS — C3490 Malignant neoplasm of unspecified part of unspecified bronchus or lung: Secondary | ICD-10-CM

## 2021-08-13 DIAGNOSIS — D491 Neoplasm of unspecified behavior of respiratory system: Secondary | ICD-10-CM

## 2021-08-13 DIAGNOSIS — Z5189 Encounter for other specified aftercare: Secondary | ICD-10-CM | POA: Diagnosis not present

## 2021-08-13 LAB — COMPREHENSIVE METABOLIC PANEL
ALT: 21 U/L (ref 0–44)
AST: 38 U/L (ref 15–41)
Albumin: 3.9 g/dL (ref 3.5–5.0)
Alkaline Phosphatase: 61 U/L (ref 38–126)
Anion gap: 11 (ref 5–15)
BUN: 14 mg/dL (ref 8–23)
CO2: 29 mmol/L (ref 22–32)
Calcium: 9.2 mg/dL (ref 8.9–10.3)
Chloride: 97 mmol/L — ABNORMAL LOW (ref 98–111)
Creatinine, Ser: 0.63 mg/dL (ref 0.44–1.00)
GFR, Estimated: >60 mL/min (ref >60)
Glucose, Bld: 175 mg/dL — ABNORMAL HIGH (ref 70–99)
Potassium: 3.5 mmol/L (ref 3.5–5.1)
Sodium: 137 mmol/L (ref 135–145)
Total Bilirubin: 0.5 mg/dL (ref 0.3–1.2)
Total Protein: 7.6 g/dL (ref 6.5–8.1)

## 2021-08-13 LAB — CBC WITH DIFFERENTIAL/PLATELET
Abs Immature Granulocytes: 0.02 10*3/uL (ref 0.00–0.07)
Basophils Absolute: 0.1 10*3/uL (ref 0.0–0.1)
Basophils Relative: 1 %
Eosinophils Absolute: 0.4 10*3/uL (ref 0.0–0.5)
Eosinophils Relative: 4 %
HCT: 38 % (ref 36.0–46.0)
Hemoglobin: 12.4 g/dL (ref 12.0–15.0)
Immature Granulocytes: 0 %
Lymphocytes Relative: 28 %
Lymphs Abs: 2.3 10*3/uL (ref 0.7–4.0)
MCH: 29.2 pg (ref 26.0–34.0)
MCHC: 32.6 g/dL (ref 30.0–36.0)
MCV: 89.6 fL (ref 80.0–100.0)
Monocytes Absolute: 0.7 10*3/uL (ref 0.1–1.0)
Monocytes Relative: 9 %
Neutro Abs: 4.7 10*3/uL (ref 1.7–7.7)
Neutrophils Relative %: 58 %
Platelets: 191 10*3/uL (ref 150–400)
RBC: 4.24 MIL/uL (ref 3.87–5.11)
RDW: 13.7 % (ref 11.5–15.5)
WBC: 8.1 10*3/uL (ref 4.0–10.5)
nRBC: 0 % (ref 0.0–0.2)

## 2021-08-13 MED ORDER — SODIUM CHLORIDE 0.9 % IV SOLN
Freq: Once | INTRAVENOUS | Status: AC
  Filled 2021-08-13: qty 250

## 2021-08-13 MED ORDER — SODIUM CHLORIDE 0.9 % IV SOLN
200.0000 mg/m2 | Freq: Once | INTRAVENOUS | Status: AC
  Administered 2021-08-13: 378 mg via INTRAVENOUS
  Filled 2021-08-13: qty 50

## 2021-08-13 MED ORDER — SODIUM CHLORIDE 0.9 % IV SOLN
585.0000 mg | Freq: Once | INTRAVENOUS | Status: AC
  Administered 2021-08-13: 590 mg via INTRAVENOUS
  Filled 2021-08-13: qty 59

## 2021-08-13 MED ORDER — SODIUM CHLORIDE 0.9 % IV SOLN
10.0000 mg | Freq: Once | INTRAVENOUS | Status: AC
  Administered 2021-08-13: 10 mg via INTRAVENOUS
  Filled 2021-08-13: qty 10

## 2021-08-13 MED ORDER — SODIUM CHLORIDE 0.9 % IV SOLN
150.0000 mg | Freq: Once | INTRAVENOUS | Status: AC
  Administered 2021-08-13: 150 mg via INTRAVENOUS
  Filled 2021-08-13: qty 150

## 2021-08-13 MED ORDER — HEPARIN SOD (PORK) LOCK FLUSH 100 UNIT/ML IV SOLN
INTRAVENOUS | Status: AC
  Filled 2021-08-13: qty 5

## 2021-08-13 MED ORDER — PALONOSETRON HCL INJECTION 0.25 MG/5ML
0.2500 mg | Freq: Once | INTRAVENOUS | Status: AC
  Administered 2021-08-13: 0.25 mg via INTRAVENOUS
  Filled 2021-08-13: qty 5

## 2021-08-13 MED ORDER — DIPHENHYDRAMINE HCL 50 MG/ML IJ SOLN
50.0000 mg | Freq: Once | INTRAMUSCULAR | Status: AC
  Administered 2021-08-13: 50 mg via INTRAVENOUS
  Filled 2021-08-13: qty 1

## 2021-08-13 MED ORDER — FAMOTIDINE 20 MG IN NS 100 ML IVPB
20.0000 mg | Freq: Once | INTRAVENOUS | Status: AC
  Administered 2021-08-13: 20 mg via INTRAVENOUS
  Filled 2021-08-13: qty 20

## 2021-08-13 MED ORDER — METFORMIN HCL 500 MG PO TABS: 500.0000 mg | ORAL_TABLET | Freq: Two times a day (BID) | ORAL | 3 refills | Status: DC

## 2021-08-13 NOTE — Progress Notes (Signed)
Met with patient during follow up visit with Dr. Rogue Bussing prior to receiving first chemo treatment today. All questions answered during visit. Pt informed that will be given follow up appts prior to leaving today. Instructed to call with any questions or needs. Pt verbalized understanding. Nothing further needed at this time.

## 2021-08-13 NOTE — Patient Instructions (Signed)
[facility]  Discharge Instructions: Thank you for choosing [facility] to provide your oncology and hematology care.  If you have a lab appointment with the [facility], please go directly to the [facility] and check in at the registration area.  Wear comfortable clothing and clothing appropriate for easy access to any Portacath or PICC line.   We strive to give you quality time with your provider. You may need to reschedule your appointment if you arrive late (15 or more minutes).  Arriving late affects you and other patients whose appointments are after yours.  Also, if you miss three or more appointments without notifying the office, you may be dismissed from the clinic at the provider's discretion.      For prescription refill requests, have your pharmacy contact our office and allow 72 hours for refills to be completed.    Today you received the following chemotherapy and/or immunotherapy agents : Taxol / Carboplatin   Paclitaxel injection What is this medication? PACLITAXEL (PAK li TAX el) is a chemotherapy drug. It targets fast dividing cells, like cancer cells, and causes these cells to die. This medicine is used to treat ovarian cancer, breast cancer, lung cancer, Kaposi's sarcoma, and other cancers. This medicine may be used for other purposes; ask your health care provider or pharmacist if you have questions. COMMON BRAND NAME(S): Onxol, Taxol What should I tell my care team before I take this medication? They need to know if you have any of these conditions: history of irregular heartbeat liver disease low blood counts, like low white cell, platelet, or red cell counts lung or breathing disease, like asthma tingling of the fingers or toes, or other nerve disorder an unusual or allergic reaction to paclitaxel, alcohol, polyoxyethylated castor oil, other chemotherapy, other medicines, foods, dyes, or preservatives pregnant  or trying to get pregnant breast-feeding How should I use this medication? This drug is given as an infusion into a vein. It is administered in a hospital or clinic by a specially trained health care professional. Talk to your pediatrician regarding the use of this medicine in children. Special care may be needed. Overdosage: If you think you have taken too much of this medicine contact a poison control center or emergency room at once. NOTE: This medicine is only for you. Do not share this medicine with others. What if I miss a dose? It is important not to miss your dose. Call your doctor or health care professional if you are unable to keep an appointment. What may interact with this medication? Do not take this medicine with any of the following medications: live virus vaccines This medicine may also interact with the following medications: antiviral medicines for hepatitis, HIV or AIDS certain antibiotics like erythromycin and clarithromycin certain medicines for fungal infections like ketoconazole and itraconazole certain medicines for seizures like carbamazepine, phenobarbital, phenytoin gemfibrozil nefazodone rifampin St. John's wort This list may not describe all possible interactions. Give your health care provider a list of all the medicines, herbs, non-prescription drugs, or dietary supplements you use. Also tell them if you smoke, drink alcohol, or use illegal drugs. Some items may interact with your medicine. What should I watch for while using this medication? Your condition will be monitored carefully while you are receiving this medicine. You will need important blood work done while you are taking this medicine. This medicine can cause serious allergic reactions. To reduce your risk you will need to take other  medicine(s) before treatment with this medicine. If you experience allergic reactions like skin rash, itching or hives, swelling of the face, lips, or tongue, tell your  doctor or health care professional right away. In some cases, you may be given additional medicines to help with side effects. Follow all directions for their use. This drug may make you feel generally unwell. This is not uncommon, as chemotherapy can affect healthy cells as well as cancer cells. Report any side effects. Continue your course of treatment even though you feel ill unless your doctor tells you to stop. Call your doctor or health care professional for advice if you get a fever, chills or sore throat, or other symptoms of a cold or flu. Do not treat yourself. This drug decreases your body's ability to fight infections. Try to avoid being around people who are sick. This medicine may increase your risk to bruise or bleed. Call your doctor or health care professional if you notice any unusual bleeding. Be careful brushing and flossing your teeth or using a toothpick because you may get an infection or bleed more easily. If you have any dental work done, tell your dentist you are receiving this medicine. Avoid taking products that contain aspirin, acetaminophen, ibuprofen, naproxen, or ketoprofen unless instructed by your doctor. These medicines may hide a fever. Do not become pregnant while taking this medicine. Women should inform their doctor if they wish to become pregnant or think they might be pregnant. There is a potential for serious side effects to an unborn child. Talk to your health care professional or pharmacist for more information. Do not breast-feed an infant while taking this medicine. Men are advised not to father a child while receiving this medicine. This product may contain alcohol. Ask your pharmacist or healthcare provider if this medicine contains alcohol. Be sure to tell all healthcare providers you are taking this medicine. Certain medicines, like metronidazole and disulfiram, can cause an unpleasant reaction when taken with alcohol. The reaction includes flushing,  headache, nausea, vomiting, sweating, and increased thirst. The reaction can last from 30 minutes to several hours. What side effects may I notice from receiving this medication? Side effects that you should report to your doctor or health care professional as soon as possible: allergic reactions like skin rash, itching or hives, swelling of the face, lips, or tongue breathing problems changes in vision fast, irregular heartbeat high or low blood pressure mouth sores pain, tingling, numbness in the hands or feet signs of decreased platelets or bleeding - bruising, pinpoint red spots on the skin, black, tarry stools, blood in the urine signs of decreased red blood cells - unusually weak or tired, feeling faint or lightheaded, falls signs of infection - fever or chills, cough, sore throat, pain or difficulty passing urine signs and symptoms of liver injury like dark yellow or brown urine; general ill feeling or flu-like symptoms; light-colored stools; loss of appetite; nausea; right upper belly pain; unusually weak or tired; yellowing of the eyes or skin swelling of the ankles, feet, hands unusually slow heartbeat Side effects that usually do not require medical attention (report to your doctor or health care professional if they continue or are bothersome): diarrhea hair loss loss of appetite muscle or joint pain nausea, vomiting pain, redness, or irritation at site where injected tiredness This list may not describe all possible side effects. Call your doctor for medical advice about side effects. You may report side effects to FDA at 1-800-FDA-1088. Where should I keep  my medication? This drug is given in a hospital or clinic and will not be stored at home. NOTE: This sheet is a summary. It may not cover all possible information. If you have questions about this medicine, talk to your doctor, pharmacist, or health care provider.  2022 [employer]vier/Gold Standard (2021-06-09 00:00:00)      Carboplatin injection What is this medication? CARBOPLATIN (KAR boe pla tin) is a chemotherapy drug. It targets fast dividing cells, like cancer cells, and causes these cells to die. This medicine is used to treat ovarian cancer and many other cancers. This medicine may be used for other purposes; ask your health care provider or pharmacist if you have questions. COMMON BRAND NAME(S): Paraplatin What should I tell my care team before I take this medication? They need to know if you have any of these conditions: blood disorders hearing problems kidney disease recent or ongoing radiation therapy an unusual or allergic reaction to carboplatin, cisplatin, other chemotherapy, other medicines, foods, dyes, or preservatives pregnant or trying to get pregnant breast-feeding How should I use this medication? This drug is usually given as an infusion into a vein. It is administered in a hospital or clinic by a specially trained health care professional. Talk to your pediatrician regarding the use of this medicine in children. Special care may be needed. Overdosage: If you think you have taken too much of this medicine contact a poison control center or emergency room at once. NOTE: This medicine is only for you. Do not share this medicine with others. What if I miss a dose? It is important not to miss a dose. Call your doctor or health care professional if you are unable to keep an appointment. What may interact with this medication? medicines for seizures medicines to increase blood counts like filgrastim, pegfilgrastim, sargramostim some antibiotics like amikacin, gentamicin, neomycin, streptomycin, tobramycin vaccines Talk to your doctor or health care professional before taking any of these medicines: acetaminophen aspirin ibuprofen ketoprofen naproxen This list may not describe all possible interactions. Give your health care provider a list of all the medicines, herbs,  non-prescription drugs, or dietary supplements you use. Also tell them if you smoke, drink alcohol, or use illegal drugs. Some items may interact with your medicine. What should I watch for while using this medication? Your condition will be monitored carefully while you are receiving this medicine. You will need important blood work done while you are taking this medicine. This drug may make you feel generally unwell. This is not uncommon, as chemotherapy can affect healthy cells as well as cancer cells. Report any side effects. Continue your course of treatment even though you feel ill unless your doctor tells you to stop. In some cases, you may be given additional medicines to help with side effects. Follow all directions for their use. Call your doctor or health care professional for advice if you get a fever, chills or sore throat, or other symptoms of a cold or flu. Do not treat yourself. This drug decreases your body's ability to fight infections. Try to avoid being around people who are sick. This medicine may increase your risk to bruise or bleed. Call your doctor or health care professional if you notice any unusual bleeding. Be careful brushing and flossing your teeth or using a toothpick because you may get an infection or bleed more easily. If you have any dental work done, tell your dentist you are receiving this medicine. Avoid taking products that contain aspirin, acetaminophen, ibuprofen, naproxen,  or ketoprofen unless instructed by your doctor. These medicines may hide a fever. Do not become pregnant while taking this medicine. Women should inform their doctor if they wish to become pregnant or think they might be pregnant. There is a potential for serious side effects to an unborn child. Talk to your health care professional or pharmacist for more information. Do not breast-feed an infant while taking this medicine. What side effects may I notice from receiving this medication? Side  effects that you should report to your doctor or health care professional as soon as possible: allergic reactions like skin rash, itching or hives, swelling of the face, lips, or tongue signs of infection - fever or chills, cough, sore throat, pain or difficulty passing urine signs of decreased platelets or bleeding - bruising, pinpoint red spots on the skin, black, tarry stools, nosebleeds signs of decreased red blood cells - unusually weak or tired, fainting spells, lightheadedness breathing problems changes in hearing changes in vision chest pain high blood pressure low blood counts - This drug may decrease the number of white blood cells, red blood cells and platelets. You may be at increased risk for infections and bleeding. nausea and vomiting pain, swelling, redness or irritation at the injection site pain, tingling, numbness in the hands or feet problems with balance, talking, walking trouble passing urine or change in the amount of urine Side effects that usually do not require medical attention (report to your doctor or health care professional if they continue or are bothersome): hair loss loss of appetite metallic taste in the mouth or changes in taste This list may not describe all possible side effects. Call your doctor for medical advice about side effects. You may report side effects to FDA at 1-800-FDA-1088. Where should I keep my medication? This drug is given in a hospital or clinic and will not be stored at home. NOTE: This sheet is a summary. It may not cover all possible information. If you have questions about this medicine, talk to your doctor, pharmacist, or health care provider.  2022 Elsevier/Gold Standard (2008-02-28 00:00:00)    To help prevent nausea and vomiting after your treatment, we encourage you to take your nausea medication as directed.  BELOW ARE SYMPTOMS THAT SHOULD BE REPORTED IMMEDIATELY: *FEVER GREATER THAN 100.4 F (38 C) OR HIGHER *CHILLS  OR SWEATING *NAUSEA AND VOMITING THAT IS NOT CONTROLLED WITH YOUR NAUSEA MEDICATION *UNUSUAL SHORTNESS OF BREATH *UNUSUAL BRUISING OR BLEEDING *URINARY PROBLEMS (pain or burning when urinating, or frequent urination) *BOWEL PROBLEMS (unusual diarrhea, constipation, pain near the anus) TENDERNESS IN MOUTH AND THROAT WITH OR WITHOUT PRESENCE OF ULCERS (sore throat, sores in mouth, or a toothache) UNUSUAL RASH, SWELLING OR PAIN  UNUSUAL VAGINAL DISCHARGE OR ITCHING   Items with * indicate a potential emergency and should be followed up as soon as possible or go to the Emergency Department if any problems should occur.  Please show the CHEMOTHERAPY ALERT CARD or IMMUNOTHERAPY ALERT CARD at check-in to the Emergency Department and triage nurse.  Should you have questions after your visit or need to cancel or reschedule your appointment, please contact [facility]  [phone] and follow the prompts.  Office hours are 8:00 a.m. to 4:30 p.m. Monday - Friday. Please note that voicemails left after 4:00 p.m. may not be returned until the following business day.  We are closed weekends and major holidays. You have access to a nurse at all times for urgent questions. Please call  the main number to the clinic [phone] and follow the prompts.  For any non-urgent questions, you may also contact your provider using MyChart. We now offer e-Visits for anyone 35 and older to request care online for non-urgent symptoms. For details visit [email or web address].   Also download the MyChart app! Go to the app store, search "MyChart", open the app, select [facility], and log in with your MyChart username and password.  Due to Covid, a mask is required upon entering the hospital/clinic. If you do not have a mask, one will be given to you upon arrival. For doctor visits, patients may have 1 support person aged 55 or older with them. For treatment visits, patients cannot have  anyone with them due to current Covid guidelines and our immunocompromised population.

## 2021-08-13 NOTE — Progress Notes (Signed)
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 Spiculated mass of the posterior right pulmonary apex measuring 3.2 x 2.8 cm, consistent with primary lung malignancy. 2. Enlarged right hilar and pretracheal lymph nodes measuring up to 1.6 x 1.5 cm, suspicious for nodal metastatic disease. 3. Recommend multidisciplinary thoracic referral for consideration of PET-CT metabolic characterization and tissue sampling. 4. Background of very fine centrilobular nodularity throughout the lungs, most concentrated at the apices. Scattered bronchiolar plugging. Findings are most consistent with smoking-related respiratory bronchiolitis. 5. Coarse, nodular contour of the liver in the included upper abdomen, suggestive of cirrhosis. 6. Coronary artery disease.  # COPD-  # DM- diet controlled; chronic back pain- hydrocodone 5 day; chronic left lower extremity weakness.  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 LYMPH NODE, HILAR, EXCISION:  - Lymph node negative for metastatic carcinoma.   B. LYMPH NODE, LEVEL 4, EXCISION:  - Lymph node negative for metastatic carcinoma.   C. LUNG, RIGHT UPPER LOBE, LOBECTOMY:  - Squamous cell carcinoma, 4.5 cm.  - Carcinoma extends through visceral pleura and focally involves  parietal pleura.  - Surgical margins negative for carcinoma.  - One lymph node negative for carcinoma.  - See oncology table and comment.   D. SOFT TISSUE, CHEST WALL, BIOPSY:  - Fibroadipose tissue with  focal inflammation and fibrosis.  - No carcinoma identified.   ONCOLOGY TABLE:  LUNG: Resection  Synchronous Tumors: Not applicable.  Total Number of Primary Tumors: 1  Procedure: Right upper lobectomy with lymph node biopsies and chest wall  biopsy.  Specimen Laterality: Right upper lobe.  Tumor Focality: Unifocal.  Tumor Site: Right upper lobe.  Tumor Size: 4.5 x 4 x 3 cm.  Histologic Type: Squamous cell carcinoma.  Visceral Pleura Invasion: Present.  Direct Invasion of Adjacent Structures: Focal involvement of parietal  pleura.  Lymphovascular Invasion: Not identified.  Margins: All surgical margins negative for carcinoma.  Regional Lymph Nodes:       Number of Lymph Nodes Involved: 0                            Nodal Sites with Tumor: 0       Number of Lymph Nodes Examined: 3                       Nodal Sites Examined: 2  Pathologic Stage Classification (pTNM, AJCC 8th Edition): pT3, pN0  Ancillary Studies: Can be performed if requested.    Malignant neoplasm of upper lobe of right lung ([place])  04/24/2021 Initial Diagnosis   Malignant neoplasm of upper lobe of right lung ([place])   05/02/2021 Cancer Staging   Staging form: Lung, AJCC 8th Edition - Clinical: Stage IB (cT2a, cN0, cM0) - Signed by Cammie Sickle, MD on 05/02/2021 Stage prefix: Initial diagnosis    07/23/2021 Cancer Staging   Staging form: Lung, AJCC 8th Edition - Pathologic: Stage IIB (  pT3, pN0, cM0) - Signed by Cammie Sickle, MD on 07/23/2021 Stage prefix: Initial diagnosis     HISTORY OF PRESENTING ILLNESS: Patient in wheelchair.  Accompanied by family.    Olivia Buchanan 70 y.o.  female right upper lobe squamous cell carcinoma is here today with results of her posterior pathology/plan of care.  Patient complains of pain at the site of surgery.  Especially taking deep breaths.  Denies any palpitations.  Denies any new onset of chest pain or shortness breath or cough.  Complains of nasal  stuffiness.  Complains of left earache.  Review of Systems  Constitutional:  Positive for malaise/fatigue and weight loss. Negative for chills, diaphoresis and fever.  HENT:  Negative for nosebleeds and sore throat.   Eyes:  Negative for double vision.  Respiratory:  Positive for cough, sputum production and shortness of breath. Negative for hemoptysis and wheezing.   Cardiovascular:  Negative for chest pain, palpitations, orthopnea and leg swelling.  Gastrointestinal:  Positive for nausea. Negative for abdominal pain, blood in stool, constipation, diarrhea, heartburn, melena and vomiting.  Genitourinary:  Negative for dysuria, frequency and urgency.  Musculoskeletal:  Positive for back pain, joint pain and neck pain.  Skin: Negative.  Negative for itching and rash.  Neurological:  Negative for tingling, focal weakness and weakness.  Endo/Heme/Allergies:  Does not bruise/bleed easily.  Psychiatric/Behavioral:  Negative for depression. The patient is not nervous/anxious and does not have insomnia.     MEDICAL HISTORY:  Past Medical History:  Diagnosis Date   Angioedema 12/08/2019   Anxiety    Aortic atherosclerosis (HCC)    Asthma    CAD (coronary artery disease)    3 vessel   Cancer (HCC)    COPD (chronic obstructive pulmonary disease) ([place]) 12/08/2019   DDD (degenerative disc disease), lumbar    Depression    Diabetes mellitus without complication (HCC)    no meds, diet controlled   Dyspnea    GERD (gastroesophageal reflux disease)    HTN (hypertension) 12/08/2019   Hyperlipidemia    Hypertension    Mass of upper lobe of right lung 04/02/2021   spiculated mass RIGHT posterior pulmonary apex with associated hilar and pretracheal LAD; measured 3.2 x 2.8 cm   Obesity (BMI 30-39.9)    T2DM (type 2 diabetes mellitus) ([place]) 12/08/2019    SURGICAL HISTORY: Past Surgical History:  Procedure Laterality Date   BACK SURGERY     lumbar L5-S1 ruptured disc x 3 surgeries   CERVICAL  FUSION  2009   FINE NEEDLE ASPIRATION  06/23/2021   Procedure: FINE NEEDLE ASPIRATION (FNA) LINEAR;  Surgeon: Garner Nash, DO;  Location: [facility];  Service: Pulmonary;;   INTERCOSTAL NERVE BLOCK Right 07/09/2021   Procedure: INTERCOSTAL NERVE BLOCK;  Surgeon: Lajuana Matte, MD;  Location: [facility];  Service: Thoracic;  Laterality: Right;   IR IMAGING GUIDED PORT INSERTION  08/06/2021   LAPAROSCOPIC TOTAL HYSTERECTOMY  1998   with oophorectomy   LYMPH NODE DISSECTION Right 07/09/2021   Procedure: LYMPH NODE DISSECTION;  Surgeon: Lajuana Matte, MD;  Location: [facility];  Service: Thoracic;  Laterality: Right;   TONSILLECTOMY     VIDEO BRONCHOSCOPY WITH ENDOBRONCHIAL NAVIGATION N/A 04/15/2021   Procedure: VIDEO BRONCHOSCOPY WITH ENDOBRONCHIAL NAVIGATION;  Surgeon: Ottie Glazier, MD;  Location: [facility];  Service: Thoracic;  Laterality: N/A;   VIDEO BRONCHOSCOPY WITH ENDOBRONCHIAL ULTRASOUND N/A 04/15/2021   Procedure: VIDEO BRONCHOSCOPY WITH ENDOBRONCHIAL ULTRASOUND;  Surgeon: Ottie Glazier, MD;  Location: [facility];  Service: Thoracic;  Laterality: N/A;   VIDEO BRONCHOSCOPY WITH ENDOBRONCHIAL ULTRASOUND N/A 06/23/2021   Procedure: VIDEO BRONCHOSCOPY WITH ENDOBRONCHIAL ULTRASOUND;  Surgeon: Garner Nash, DO;  Location: [facility];  Service: Pulmonary;  Laterality: N/A;    SOCIAL HISTORY: Social History   Socioeconomic History   Marital status: Married    Spouse name: Dwight   Number of children: 3   Years of education: Not on file   Highest education level: Not on file  Occupational History   Not on file  Tobacco Use   Smoking status: Former    Packs/day: 3.00    Years: 50.00    Pack years: 150.00    Types: Cigarettes    Quit date: 2019    Years since quitting: 3.9   Smokeless tobacco: Never  Vaping Use   Vaping Use: Never used  Substance and Sexual Activity   Alcohol use: Not Currently   Drug use: Never   Sexual activity: Not on file    Comment:  Hysterectomy  Other Topics Concern   Not on file  Social History Narrative   Lives in home; with husband; lives in [place]; never smoking 2019; no alcohol. 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    Social Determinants of Health   Financial Resource Strain: Not on file  Food Insecurity: Not on file  Transportation Needs: Not on file  Physical Activity: Not on file  Stress: Not on file  Social Connections: Not on file  Intimate Partner Violence: Not on file    FAMILY HISTORY: Family History  Problem Relation Age of Onset   Bone cancer Maternal Uncle     ALLERGIES:  is allergic to ibuprofen and macrobid [nitrofurantoin].  MEDICATIONS:  Current Outpatient Medications  Medication Sig Dispense Refill   albuterol (VENTOLIN HFA) 108 (90 Base) MCG/ACT inhaler Inhale 1 puff into the lungs every 6 (six) hours as needed for wheezing. (Patient not taking: Reported on 09/03/2021)     aspirin EC 81 MG tablet Take 81 mg by mouth at bedtime.     clonazePAM (KLONOPIN) 0.5 MG tablet Take 0.5 mg by mouth 3 (three) times daily as needed for anxiety.     EPINEPHrine 0.3 mg/0.3 mL IJ SOAJ injection Inject 0.3 mLs (0.3 mg total) into the muscle as needed for anaphylaxis. 2 each 0   fluticasone-salmeterol (ADVAIR) 250-50 MCG/ACT AEPB Inhale 1 puff into the lungs in the morning and at bedtime.     gabapentin (NEURONTIN) 300 MG capsule Take 300 mg by mouth 3 (three) times daily.     hydrochlorothiazide (HYDRODIURIL) 25 MG tablet Take 25 mg by mouth daily.     ipratropium-albuterol (DUONEB) 0.5-2.5 (3) MG/3ML SOLN Take 3 mLs by nebulization 3 (three) times daily.     lidocaine-prilocaine (EMLA) cream Apply to affected area once 30 g 3   metFORMIN (GLUCOPHAGE) 500 MG tablet Take 1 tablet (500 mg total) by mouth 2 (two) times daily with a meal. 60 tablet 3   nystatin cream (MYCOSTATIN) Apply 1 application topically 2 (two) times daily as needed for dry skin (around lips).      potassium chloride SA (KLOR-CON) 20 MEQ tablet Take 20 mEq by mouth 2 (two) times daily.     tiZANidine (ZANAFLEX) 4 MG tablet Take 4 mg by mouth 3 (three) times daily.     venlafaxine XR (EFFEXOR-XR) 75 MG 24 hr capsule Take 75 mg by mouth at bedtime.     Ascorbic Acid (VITAMIN  C) 1000 MG tablet Take 1,000 mg by mouth in the morning and at bedtime. (Patient not taking: Reported on 08/13/2021)     cetirizine-pseudoephedrine (ZYRTEC-D) 5-120 MG tablet Take 1 tablet by mouth at bedtime. (Patient not taking: Reported on 08/13/2021)     dexamethasone (DECADRON) 4 MG tablet Take 2 tablets (8 mg total) by mouth daily. Start the day after carboplatin chemotherapy for 3 days. 30 tablet 1   docusate sodium (COLACE) 100 MG capsule Take 200 mg by mouth at bedtime.     ergocalciferol (VITAMIN D2) 1.25 MG (50000 UT) capsule Take 1 capsule (50,000 Units total) by mouth once a week. 12 capsule 1   Melatonin 10 MG TABS Take 10 mg by mouth at bedtime. (Patient not taking: Reported on 08/21/2021)     Multiple Vitamin (MULTI-VITAMIN) tablet Take 1 tablet by mouth at bedtime. (Patient not taking: Reported on 08/13/2021)     pantoprazole (PROTONIX) 40 MG tablet Take 40 mg by mouth at bedtime.     pravastatin (PRAVACHOL) 80 MG tablet Take 80 mg by mouth at bedtime.     prochlorperazine (COMPAZINE) 10 MG tablet Take 1 tablet (10 mg total) by mouth every 6 (six) hours as needed (Nausea or vomiting). 30 tablet 1   No current facility-administered medications for this visit.      Marland Kitchen  PHYSICAL EXAMINATION: ECOG PERFORMANCE STATUS: 1 - Symptomatic but completely ambulatory  Vitals:   08/13/21 0907  BP: 118/82  Pulse: 74  Resp: 18  Temp: 98.7 F (37.1 C)  SpO2: 97%   Filed Weights   08/13/21 0907  Weight: 183 lb 11.2 oz (83.3 kg)    Physical Exam Vitals and nursing note reviewed.  Constitutional:      Comments: Patient is accompanied by daughter.  Ambulating independently.  HENT:     Head: Normocephalic  and atraumatic.     Mouth/Throat:     Mouth: Mucous membranes are moist.     Pharynx: Oropharynx is clear. No oropharyngeal exudate.  Eyes:     Extraocular Movements: Extraocular movements intact.     Pupils: Pupils are equal, round, and reactive to light.  Cardiovascular:     Rate and Rhythm: Normal rate and regular rhythm.  Pulmonary:     Effort: No respiratory distress.     Breath sounds: No wheezing.     Comments: Decreased breath sounds bilaterally at bases.  No wheeze or crackles Abdominal:     General: Bowel sounds are normal. There is no distension.     Palpations: Abdomen is soft. There is no mass.     Tenderness: There is no abdominal tenderness. There is no guarding or rebound.  Musculoskeletal:        General: No tenderness. Normal range of motion.     Cervical back: Normal range of motion and neck supple.  Skin:    General: Skin is warm.  Neurological:     General: No focal deficit present.     Mental Status: She is alert and oriented to person, place, and time.     Comments: 4-5 strength in the left lower extremity/chronic.  Psychiatric:        Mood and Affect: Affect normal.        Behavior: Behavior normal.        Judgment: Judgment normal.     LABORATORY DATA:  I have reviewed the data as listed Lab Results  Component Value Date   WBC 7.3 09/03/2021   HGB 11.9 (L) 09/03/2021  HCT 35.8 (L) 09/03/2021   MCV 90.9 09/03/2021   PLT 201 09/03/2021   Recent Labs    07/11/21 0112 07/13/21 0804 08/13/21 0837 09/03/21 0806  NA 137 138 137 139  K 3.5 3.6 3.5 3.8  CL 104 105 97* 101  CO2 $Re'27 25 29 26  'IBR$ GLUCOSE 170* 187* 175* 173*  BUN 17 6* 14 10  CREATININE 0.90 0.62 0.63 0.58  CALCIUM 8.5* 8.3* 9.2 9.1  GFRNONAA >60 >60 >60 >60  PROT 5.7*  --  7.6 7.5  ALBUMIN 2.5*  --  3.9 3.7  AST 25  --  38 33  ALT 13  --  21 20  ALKPHOS 43  --  61 77  BILITOT 0.4  --  0.5 0.4    RADIOGRAPHIC STUDIES: I have personally reviewed the radiological images as  listed and agreed with the findings in the report. DG Chest 2 View  Result Date: 08/21/2021 CLINICAL DATA:  Previous resection of right lung mass EXAM: CHEST - 2 VIEW COMPARISON:  Previous studies including the examination of 07/17/2021 FINDINGS: Cardiac size is within normal limits. There are no signs of pulmonary edema. There is interval clearing of patchy infiltrate in the right parahilar region. There are no new infiltrates. There is no pleural effusion or pneumothorax. Tip of right chest port is seen in the superior vena cava close to the right atrium. IMPRESSION: There are no signs of pulmonary edema or focal pulmonary consolidation. Electronically Signed   By: Elmer Picker M.D.   On: 08/21/2021 11:09   IR IMAGING GUIDED PORT INSERTION  Result Date: 08/06/2021 CLINICAL DATA:  Squamous lung carcinoma of the right upper lobe and need for porta cath for chemotherapy. EXAM: IMPLANTED PORT A CATH PLACEMENT WITH ULTRASOUND AND FLUOROSCOPIC GUIDANCE ANESTHESIA/SEDATION: Moderate (conscious) sedation was employed during this procedure. A total of Versed 1.0 mg and Fentanyl 50 mcg was administered intravenously by radiology nursing under my direct supervision. Moderate Sedation Time: 24 minutes. The patient's level of consciousness and vital signs were monitored continuously by radiology nursing throughout the procedure under my direct supervision. FLUOROSCOPY TIME:  36 seconds.  5.8 mGy. PROCEDURE: The procedure, risks, benefits, and alternatives were explained to the patient. Questions regarding the procedure were encouraged and answered. The patient understands and consents to the procedure. A time-out was performed prior to initiating the procedure. Ultrasound was utilized to confirm patency of the right internal jugular vein. The right neck and chest were prepped with chlorhexidine in a sterile fashion, and a sterile drape was applied covering the operative field. Maximum barrier sterile technique  with sterile gowns and gloves were used for the procedure. Local anesthesia was provided with 1% lidocaine. After creating a small venotomy incision, a 21 gauge needle was advanced into the right internal jugular vein under direct, real-time ultrasound guidance. Ultrasound image documentation was performed. After securing guidewire access, an 8 Fr dilator was placed. A J-wire was kinked to measure appropriate catheter length. A subcutaneous port pocket was then created along the upper chest wall utilizing sharp and blunt dissection. Portable cautery was utilized. The pocket was irrigated with sterile saline. A single lumen power injectable port was chosen for placement. The 8 Fr catheter was tunneled from the port pocket site to the venotomy incision. The port was placed in the pocket. External catheter was trimmed to appropriate length based on guidewire measurement. At the venotomy, an 8 Fr peel-away sheath was placed over a guidewire. The catheter was then placed through  the sheath and the sheath removed. Final catheter positioning was confirmed and documented with a fluoroscopic spot image. The port was accessed with a needle and aspirated and flushed with heparinized saline. The access needle was removed. The venotomy and port pocket incisions were closed with subcutaneous 3-0 Monocryl and subcuticular 4-0 Vicryl. Dermabond was applied to both incisions. COMPLICATIONS: COMPLICATIONS None FINDINGS: After catheter placement, the tip lies at the cavo-atrial junction. The catheter aspirates normally and is ready for immediate use. IMPRESSION: Placement of single lumen port a cath via right internal jugular vein. The catheter tip lies at the cavo-atrial junction. A power injectable port a cath was placed and is ready for immediate use. Electronically Signed   By: Aletta Edouard M.D.   On: 08/06/2021 11:35    ASSESSMENT & PLAN:   Malignant neoplasm of upper lobe of right lung ([place]) # pT3pN0 [4.5cm; Squamous  cell ca- + involvement of the parietal pleura]; stage II. Currently on adjuvant carbo-taxol q Based on PD-L1 status benefit from immunotherapy.    # Proceed with carbo-taxol cycle #1. Labs today reviewed;  acceptable for treatment today.   Discussed the potential side effects including but not limited to-increasing fatigue, nausea vomiting, diarrhea, hair loss, sores in the mouth, increase risk of infection and also neuropathy.   # chronic back pain/chronic left LE/Bil PN-history of cervical spine injury-no evidence of metastatic disease.  Continue hydrocodone [for now as per Dr. Doy Hutching. STABLE  #History of diabetes- start metformin 500 mg BID. Call us if BG are elevated.   # DISPOSITION: #  carbo-taxol;  # schedule  On 11/12 - undeyca # follow up in 3 weeks- MD; labs- cbc/cmp- Carbo-taxol [new]; D-2 [name]- Dr.B  Cc: Dr.A/Dr.Sparks     All questions were answered. The patient knows to call the clinic with any problems, questions or concerns.       Cammie Sickle, MD 09/04/2021 2:52 PM

## 2021-08-13 NOTE — Assessment & Plan Note (Addendum)
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 Currently on adjuvant carbo-taxol q Based on PD-L1 status benefit from immunotherapy.    # Proceed with carbo-taxol cycle #1. Labs today reviewed;  acceptable for treatment today.   Discussed the potential side effects including but not limited to-increasing fatigue, nausea vomiting, diarrhea, hair loss, sores in the mouth, increase risk of infection and also neuropathy.   # chronic back pain/chronic left LE/Bil PN-history of cervical spine injury-no evidence of metastatic disease.  Continue hydrocodone [for now as per Dr. Doy Hutching. STABLE  #History of diabetes- start metformin 500 mg BID. Call us if BG are elevated.   # DISPOSITION: #  carbo-taxol;  # schedule  On 11/12 - undeyca # follow up in 3 weeks- MD; labs- cbc/cmp- Carbo-taxol [new]; D-2 [name]- Dr.B  Cc: Dr.A/Dr.Sparks

## 2021-08-14 ENCOUNTER — Inpatient Hospital Stay: Payer: Medicare HMO

## 2021-08-14 DIAGNOSIS — Z5189 Encounter for other specified aftercare: Secondary | ICD-10-CM | POA: Diagnosis not present

## 2021-08-14 DIAGNOSIS — C3411 Malignant neoplasm of upper lobe, right bronchus or lung: Secondary | ICD-10-CM | POA: Diagnosis not present

## 2021-08-14 DIAGNOSIS — D491 Neoplasm of unspecified behavior of respiratory system: Secondary | ICD-10-CM

## 2021-08-14 DIAGNOSIS — Z5111 Encounter for antineoplastic chemotherapy: Secondary | ICD-10-CM | POA: Diagnosis not present

## 2021-08-14 MED ORDER — PEGFILGRASTIM-CBQV 6 MG/0.6ML ~~LOC~~ SOSY
6.0000 mg | PREFILLED_SYRINGE | Freq: Once | SUBCUTANEOUS | Status: AC
  Administered 2021-08-14: 6 mg via SUBCUTANEOUS
  Filled 2021-08-14: qty 0.6

## 2021-08-17 ENCOUNTER — Telehealth: Payer: Self-pay

## 2021-08-17 NOTE — Telephone Encounter (Signed)
Telephone call to patient for follow up after receiving first infusion.   No answer but left message stating we were calling to check on them.  Encouraged patient to call for any questions or concerns.   

## 2021-08-19 ENCOUNTER — Other Ambulatory Visit: Payer: Self-pay | Admitting: Thoracic Surgery (Cardiothoracic Vascular Surgery)

## 2021-08-19 DIAGNOSIS — R918 Other nonspecific abnormal finding of lung field: Secondary | ICD-10-CM

## 2021-08-21 ENCOUNTER — Ambulatory Visit
Admission: RE | Admit: 2021-08-21 | Discharge: 2021-08-21 | Disposition: A | Discharge status: Home / Self Care | Payer: Medicare HMO | Source: Ambulatory Visit | Attending: Thoracic Surgery (Cardiothoracic Vascular Surgery) | Admitting: Thoracic Surgery (Cardiothoracic Vascular Surgery)

## 2021-08-21 ENCOUNTER — Other Ambulatory Visit: Payer: Self-pay

## 2021-08-21 ENCOUNTER — Ambulatory Visit (INDEPENDENT_AMBULATORY_CARE_PROVIDER_SITE_OTHER): Payer: Self-pay | Admitting: Thoracic Surgery (Cardiothoracic Vascular Surgery)

## 2021-08-21 VITALS — BP 129/75 | HR 125 | Resp 20 | Ht 59.0 in | Wt 182.0 lb

## 2021-08-21 DIAGNOSIS — Z9889 Other specified postprocedural states: Secondary | ICD-10-CM

## 2021-08-21 DIAGNOSIS — C3411 Malignant neoplasm of upper lobe, right bronchus or lung: Secondary | ICD-10-CM

## 2021-08-21 DIAGNOSIS — R918 Other nonspecific abnormal finding of lung field: Secondary | ICD-10-CM

## 2021-08-21 NOTE — Progress Notes (Signed)
      [address]       [place] [address]             [phone]        Olivia Buchanan [place] Medical Record #[id number] Date of Birth: 1951/03/04  Referring: Olivia Buchanan, * Primary Care: Olivia Crouch, MD Primary Cardiologist:None  Reason for visit:   follow-up  History of Present Illness:     Ms. Olivia Buchanan comes in for her 1 month follow-up appointment.  She is very fatigued and lacks energy since starting her chemotherapy.  She denies any shortness of breath or incisional pain.  Physical Exam: BP 129/75   Pulse (!) 125   Resp 20   Ht 4\' 11"  (1.499 m)   Wt 182 lb (82.6 kg)   SpO2 95% Comment: RA  BMI 36.76 kg/m   Alert NAD Incision clean well-healed.   Abdomen soft, ND No peripheral edema   Diagnostic Studies & Laboratory data: CXR: Clear     Assessment / Plan:   70 year old female status post robotic assisted right upper lobectomy for T3 N0 M0 stage IIb adenocarcinoma.  She is currently undergoing adjuvant therapy.  Her chest x-ray is clear.  She is stable from a surgical standpoint to resume all activity.  She will continue her surveillance through the cancer center at [facility].  She will follow-up with [place] as needed.   Olivia Buchanan 08/21/2021 3:28 PM

## 2021-08-26 DIAGNOSIS — E782 Mixed hyperlipidemia: Secondary | ICD-10-CM | POA: Diagnosis not present

## 2021-08-26 DIAGNOSIS — C3411 Malignant neoplasm of upper lobe, right bronchus or lung: Secondary | ICD-10-CM | POA: Diagnosis not present

## 2021-08-26 DIAGNOSIS — R8290 Unspecified abnormal findings in urine: Secondary | ICD-10-CM | POA: Diagnosis not present

## 2021-08-26 DIAGNOSIS — I10 Essential (primary) hypertension: Secondary | ICD-10-CM | POA: Diagnosis not present

## 2021-08-26 DIAGNOSIS — J431 Panlobular emphysema: Secondary | ICD-10-CM | POA: Diagnosis not present

## 2021-08-26 DIAGNOSIS — E118 Type 2 diabetes mellitus with unspecified complications: Secondary | ICD-10-CM | POA: Diagnosis not present

## 2021-09-01 ENCOUNTER — Telehealth: Payer: Self-pay | Admitting: *Deleted

## 2021-09-01 NOTE — Telephone Encounter (Signed)
Pt is having increased bone and joint pain since receiving last chemo treatment on 11/10. Pt informed that her symptoms are most likely related to her treatment or the udenyca injection she received on 11/11. Encouraged to take Tylenol and Claritin for symptom relief. Pt is scheduled for next chemo on 12/1. Informed that will follow up at that visit with Dr. B to further assess her symptoms. Pt verbalized understanding.

## 2021-09-03 ENCOUNTER — Encounter: Payer: Self-pay | Admitting: *Deleted

## 2021-09-03 ENCOUNTER — Encounter: Payer: Self-pay | Admitting: Internal Medicine

## 2021-09-03 ENCOUNTER — Other Ambulatory Visit: Payer: Self-pay

## 2021-09-03 ENCOUNTER — Inpatient Hospital Stay: Payer: Medicare HMO

## 2021-09-03 ENCOUNTER — Inpatient Hospital Stay: Payer: Medicare HMO | Attending: Internal Medicine

## 2021-09-03 ENCOUNTER — Inpatient Hospital Stay (HOSPITAL_BASED_OUTPATIENT_CLINIC_OR_DEPARTMENT_OTHER): Payer: Medicare HMO | Admitting: Internal Medicine

## 2021-09-03 VITALS — Resp 18

## 2021-09-03 DIAGNOSIS — J449 Chronic obstructive pulmonary disease, unspecified: Secondary | ICD-10-CM | POA: Diagnosis not present

## 2021-09-03 DIAGNOSIS — C3411 Malignant neoplasm of upper lobe, right bronchus or lung: Secondary | ICD-10-CM | POA: Insufficient documentation

## 2021-09-03 DIAGNOSIS — I2510 Atherosclerotic heart disease of native coronary artery without angina pectoris: Secondary | ICD-10-CM | POA: Insufficient documentation

## 2021-09-03 DIAGNOSIS — G620 Drug-induced polyneuropathy: Secondary | ICD-10-CM | POA: Insufficient documentation

## 2021-09-03 DIAGNOSIS — E119 Type 2 diabetes mellitus without complications: Secondary | ICD-10-CM | POA: Insufficient documentation

## 2021-09-03 DIAGNOSIS — Z5111 Encounter for antineoplastic chemotherapy: Secondary | ICD-10-CM | POA: Insufficient documentation

## 2021-09-03 DIAGNOSIS — D491 Neoplasm of unspecified behavior of respiratory system: Secondary | ICD-10-CM

## 2021-09-03 LAB — COMPREHENSIVE METABOLIC PANEL WITH GFR
ALT: 20 U/L (ref 0–44)
AST: 33 U/L (ref 15–41)
Albumin: 3.7 g/dL (ref 3.5–5.0)
Alkaline Phosphatase: 77 U/L (ref 38–126)
Anion gap: 12 (ref 5–15)
BUN: 10 mg/dL (ref 8–23)
CO2: 26 mmol/L (ref 22–32)
Calcium: 9.1 mg/dL (ref 8.9–10.3)
Chloride: 101 mmol/L (ref 98–111)
Creatinine, Ser: 0.58 mg/dL (ref 0.44–1.00)
GFR, Estimated: >60 mL/min
Glucose, Bld: 173 mg/dL — ABNORMAL HIGH (ref 70–99)
Potassium: 3.8 mmol/L (ref 3.5–5.1)
Sodium: 139 mmol/L (ref 135–145)
Total Bilirubin: 0.4 mg/dL (ref 0.3–1.2)
Total Protein: 7.5 g/dL (ref 6.5–8.1)

## 2021-09-03 LAB — CBC WITH DIFFERENTIAL/PLATELET
Abs Immature Granulocytes: 0.02 10*3/uL (ref 0.00–0.07)
Basophils Absolute: 0.1 10*3/uL (ref 0.0–0.1)
Basophils Relative: 1 %
Eosinophils Absolute: 0.3 10*3/uL (ref 0.0–0.5)
Eosinophils Relative: 4 %
HCT: 35.8 % — ABNORMAL LOW (ref 36.0–46.0)
Hemoglobin: 11.9 g/dL — ABNORMAL LOW (ref 12.0–15.0)
Immature Granulocytes: 0 %
Lymphocytes Relative: 28 %
Lymphs Abs: 2.1 10*3/uL (ref 0.7–4.0)
MCH: 30.2 pg (ref 26.0–34.0)
MCHC: 33.2 g/dL (ref 30.0–36.0)
MCV: 90.9 fL (ref 80.0–100.0)
Monocytes Absolute: 0.8 10*3/uL (ref 0.1–1.0)
Monocytes Relative: 10 %
Neutro Abs: 4.1 10*3/uL (ref 1.7–7.7)
Neutrophils Relative %: 57 %
Platelets: 201 10*3/uL (ref 150–400)
RBC: 3.94 MIL/uL (ref 3.87–5.11)
RDW: 14.5 % (ref 11.5–15.5)
WBC: 7.3 10*3/uL (ref 4.0–10.5)
nRBC: 0 % (ref 0.0–0.2)

## 2021-09-03 MED ORDER — PALONOSETRON HCL INJECTION 0.25 MG/5ML
0.2500 mg | Freq: Once | INTRAVENOUS | Status: AC
  Administered 2021-09-03: 0.25 mg via INTRAVENOUS
  Filled 2021-09-03: qty 5

## 2021-09-03 MED ORDER — SODIUM CHLORIDE 0.9 % IV SOLN
585.0000 mg | Freq: Once | INTRAVENOUS | Status: AC
  Administered 2021-09-03: 590 mg via INTRAVENOUS
  Filled 2021-09-03: qty 59

## 2021-09-03 MED ORDER — SODIUM CHLORIDE 0.9 % IV SOLN
Freq: Once | INTRAVENOUS | Status: AC
Start: 2021-09-03 — End: 2021-09-03
  Filled 2021-09-03: qty 250

## 2021-09-03 MED ORDER — FAMOTIDINE 20 MG IN NS 100 ML IVPB
20.0000 mg | Freq: Once | INTRAVENOUS | Status: AC
  Administered 2021-09-03: 20 mg via INTRAVENOUS
  Filled 2021-09-03: qty 100
  Filled 2021-09-03: qty 20

## 2021-09-03 MED ORDER — SODIUM CHLORIDE 0.9 % IV SOLN
150.0000 mg | Freq: Once | INTRAVENOUS | Status: AC
  Administered 2021-09-03: 150 mg via INTRAVENOUS
  Filled 2021-09-03: qty 150

## 2021-09-03 MED ORDER — SODIUM CHLORIDE 0.9% FLUSH
10.0000 mL | INTRAVENOUS | Status: DC | PRN
  Administered 2021-09-03: 10 mL via INTRAVENOUS
  Filled 2021-09-03: qty 10

## 2021-09-03 MED ORDER — HEPARIN SOD (PORK) LOCK FLUSH 100 UNIT/ML IV SOLN
500.0000 [IU] | Freq: Once | INTRAVENOUS | Status: AC
  Filled 2021-09-03: qty 5

## 2021-09-03 MED ORDER — ERGOCALCIFEROL 1.25 MG (50000 UT) PO CAPS: 50000.0000 [IU] | ORAL_CAPSULE | ORAL | 1 refills | Status: DC

## 2021-09-03 MED ORDER — SODIUM CHLORIDE 0.9 % IV SOLN
200.0000 mg/m2 | Freq: Once | INTRAVENOUS | Status: AC
  Administered 2021-09-03: 378 mg via INTRAVENOUS
  Filled 2021-09-03: qty 63

## 2021-09-03 MED ORDER — DIPHENHYDRAMINE HCL 50 MG/ML IJ SOLN
50.0000 mg | Freq: Once | INTRAMUSCULAR | Status: AC
  Administered 2021-09-03: 50 mg via INTRAVENOUS
  Filled 2021-09-03: qty 1

## 2021-09-03 MED ORDER — HEPARIN SOD (PORK) LOCK FLUSH 100 UNIT/ML IV SOLN
INTRAVENOUS | Status: AC
  Administered 2021-09-03: 500 [IU] via INTRAVENOUS
  Filled 2021-09-03: qty 5

## 2021-09-03 MED ORDER — HEPARIN SOD (PORK) LOCK FLUSH 100 UNIT/ML IV SOLN
500.0000 [IU] | Freq: Once | INTRAVENOUS | Status: DC | PRN
  Filled 2021-09-03: qty 5

## 2021-09-03 MED ORDER — SODIUM CHLORIDE 0.9 % IV SOLN
10.0000 mg | Freq: Once | INTRAVENOUS | Status: AC
  Administered 2021-09-03: 10 mg via INTRAVENOUS
  Filled 2021-09-03: qty 10

## 2021-09-03 NOTE — Progress Notes (Signed)
Met with patient during follow up visit with Dr. Rogue Bussing. All questions answered during visit. Informed pt that she can go for a wig fitting after she finishes chemotherapy this afternoon. Instructed pt to call with any questions or needs. Pt verbalized understanding.

## 2021-09-03 NOTE — Assessment & Plan Note (Addendum)
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 Currently on adjuvant carbo-taxol x4 [poor candidate for cisplatin]. n PD-L1-5% will benefit from immunotherapy post 4cycles of chemo.   # Proceed with carbo-taxol cycle #2. Labs today reviewed;  acceptable for treatment today.   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 DC udenyca; start vit D 50,000.   # chronic back pain/chronic left LE/Bil PN-history of cervical spine injury-no evidence of metastatic disease.  Continue hydrocodone [for now as per Dr. Doy Hutching. STABLE  #History of diabetes- continue metformin 500 mg BID  # DISPOSITION:pt pref/X-mas #  carbo-taxol;  # cancel- udenyca tomorrow.  # follow up on 27th- MD; labs- cbc/cmp- Carbo-taxol ; D-2 [name]- Dr.B   Cc: Dr.A/Dr.Sparks

## 2021-09-03 NOTE — Patient Instructions (Signed)
[facility] AT [place]  Discharge Instructions: Thank you for choosing [facility] to provide your oncology and hematology care.  If you have a lab appointment with the [facility], please go directly to the [facility] and check in at the registration area.  Wear comfortable clothing and clothing appropriate for easy access to any Portacath or PICC line.   We strive to give you quality time with your provider. You may need to reschedule your appointment if you arrive late (15 or more minutes).  Arriving late affects you and other patients whose appointments are after yours.  Also, if you miss three or more appointments without notifying the office, you may be dismissed from the clinic at the provider's discretion.      For prescription refill requests, have your pharmacy contact our office and allow 72 hours for refills to be completed.    Today you received the following chemotherapy and/or immunotherapy agents Taxol and Carboplatin       To help prevent nausea and vomiting after your treatment, we encourage you to take your nausea medication as directed.  BELOW ARE SYMPTOMS THAT SHOULD BE REPORTED IMMEDIATELY: *FEVER GREATER THAN 100.4 F (38 C) OR HIGHER *CHILLS OR SWEATING *NAUSEA AND VOMITING THAT IS NOT CONTROLLED WITH YOUR NAUSEA MEDICATION *UNUSUAL SHORTNESS OF BREATH *UNUSUAL BRUISING OR BLEEDING *URINARY PROBLEMS (pain or burning when urinating, or frequent urination) *BOWEL PROBLEMS (unusual diarrhea, constipation, pain near the anus) TENDERNESS IN MOUTH AND THROAT WITH OR WITHOUT PRESENCE OF ULCERS (sore throat, sores in mouth, or a toothache) UNUSUAL RASH, SWELLING OR PAIN  UNUSUAL VAGINAL DISCHARGE OR ITCHING   Items with * indicate a potential emergency and should be followed up as soon as possible or go to the Emergency Department if any problems should occur.  Please show the CHEMOTHERAPY ALERT CARD or IMMUNOTHERAPY ALERT CARD at  check-in to the Emergency Department and triage nurse.  Should you have questions after your visit or need to cancel or reschedule your appointment, please contact [facility] [place] AT [place]  [phone] and follow the prompts.  Office hours are 8:00 a.m. to 4:30 p.m. Monday - Friday. Please note that voicemails left after 4:00 p.m. may not be returned until the following business day.  We are closed weekends and major holidays. You have access to a nurse at all times for urgent questions. Please call the main number to the clinic [phone] and follow the prompts.  For any non-urgent questions, you may also contact your provider using MyChart. We now offer e-Visits for anyone 58 and older to request care online for non-urgent symptoms. For details visit [email or web address].   Also download the MyChart app! Go to the app store, search "MyChart", open the app, select [facility], and log in with your MyChart username and password.  Due to Covid, a mask is required upon entering the hospital/clinic. If you do not have a mask, one will be given to you upon arrival. For doctor visits, patients may have 1 support person aged 75 or older with them. For treatment visits, patients cannot have anyone with them due to current Covid guidelines and our immunocompromised population.

## 2021-09-03 NOTE — Progress Notes (Signed)
Pt in for follow up, reports dizziness, shortness of breath with wheezing. Pt also having pain in ankles and hands, states pain, numbness and tingling.

## 2021-09-04 ENCOUNTER — Inpatient Hospital Stay: Payer: Medicare HMO

## 2021-09-04 ENCOUNTER — Encounter: Payer: Self-pay | Admitting: Internal Medicine
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
## 2021-09-04 NOTE — Progress Notes (Signed)
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 Spiculated mass of the posterior right pulmonary apex measuring 3.2 x 2.8 cm, consistent with primary lung malignancy. 2. Enlarged right hilar and pretracheal lymph nodes measuring up to 1.6 x 1.5 cm, suspicious for nodal metastatic disease. 3. Recommend multidisciplinary thoracic referral for consideration of PET-CT metabolic characterization and tissue sampling. 4. Background of very fine centrilobular nodularity throughout the lungs, most concentrated at the apices. Scattered bronchiolar plugging. Findings are most consistent with smoking-related respiratory bronchiolitis. 5. Coarse, nodular contour of the liver in the included upper abdomen, suggestive of cirrhosis. 6. Coronary artery disease.  # COPD-  # DM- diet controlled; chronic back pain- hydrocodone 5 day; chronic left lower extremity weakness.  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 LYMPH NODE, HILAR, EXCISION:  - Lymph node negative for metastatic carcinoma.   B. LYMPH NODE, LEVEL 4, EXCISION:  - Lymph node negative for metastatic carcinoma.   C. LUNG, RIGHT UPPER LOBE, LOBECTOMY:  - Squamous cell carcinoma, 4.5 cm.  - Carcinoma extends through visceral pleura and focally involves  parietal pleura.  - Surgical margins negative for carcinoma.  - One lymph node negative for carcinoma.  - See oncology table and comment.   D. SOFT TISSUE, CHEST WALL, BIOPSY:  - Fibroadipose tissue with  focal inflammation and fibrosis.  - No carcinoma identified.   ONCOLOGY TABLE:  LUNG: Resection  Synchronous Tumors: Not applicable.  Total Number of Primary Tumors: 1  Procedure: Right upper lobectomy with lymph node biopsies and chest wall  biopsy.  Specimen Laterality: Right upper lobe.  Tumor Focality: Unifocal.  Tumor Site: Right upper lobe.  Tumor Size: 4.5 x 4 x 3 cm.  Histologic Type: Squamous cell carcinoma.  Visceral Pleura Invasion: Present.  Direct Invasion of Adjacent Structures: Focal involvement of parietal  pleura.  Lymphovascular Invasion: Not identified.  Margins: All surgical margins negative for carcinoma.  Regional Lymph Nodes:       Number of Lymph Nodes Involved: 0                            Nodal Sites with Tumor: 0       Number of Lymph Nodes Examined: 3                       Nodal Sites Examined: 2  Pathologic Stage Classification (pTNM, AJCC 8th Edition): pT3, pN0  Ancillary Studies: Can be performed if requested.    Malignant neoplasm of upper lobe of right lung ([place])  04/24/2021 Initial Diagnosis   Malignant neoplasm of upper lobe of right lung ([place])   05/02/2021 Cancer Staging   Staging form: Lung, AJCC 8th Edition - Clinical: Stage IB (cT2a, cN0, cM0) - Signed by Cammie Sickle, MD on 05/02/2021 Stage prefix: Initial diagnosis    07/23/2021 Cancer Staging   Staging form: Lung, AJCC 8th Edition - Pathologic: Stage IIB (  pT3, pN0, cM0) - Signed by Cammie Sickle, MD on 07/23/2021 Stage prefix: Initial diagnosis     HISTORY OF PRESENTING ILLNESS: Patient ambulating independently.  Alone.  Olivia Buchanan 70 y.o.  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  Patient notes to have worsening pain/tingling and numbness of the bilateral upper extremities.  Complains of myalgias.  Fatigue.  Denies any new shortness of breath or cough.   Review of Systems   Constitutional:  Positive for malaise/fatigue and weight loss. Negative for chills, diaphoresis and fever.  HENT:  Negative for nosebleeds and sore throat.   Eyes:  Negative for double vision.  Respiratory:  Positive for cough, sputum production and shortness of breath. Negative for hemoptysis and wheezing.   Cardiovascular:  Negative for chest pain, palpitations, orthopnea and leg swelling.  Gastrointestinal:  Positive for nausea. Negative for abdominal pain, blood in stool, constipation, diarrhea, heartburn, melena and vomiting.  Genitourinary:  Negative for dysuria, frequency and urgency.  Musculoskeletal:  Positive for back pain, joint pain and neck pain.  Skin: Negative.  Negative for itching and rash.  Neurological:  Negative for tingling, focal weakness and weakness.  Endo/Heme/Allergies:  Does not bruise/bleed easily.  Psychiatric/Behavioral:  Negative for depression. The patient is not nervous/anxious and does not have insomnia.     MEDICAL HISTORY:  Past Medical History:  Diagnosis Date   Angioedema 12/08/2019   Anxiety    Aortic atherosclerosis (HCC)    Asthma    CAD (coronary artery disease)    3 vessel   Cancer (HCC)    COPD (chronic obstructive pulmonary disease) ([facility]) 12/08/2019   DDD (degenerative disc disease), lumbar    Depression    Diabetes mellitus without complication (HCC)    no meds, diet controlled   Dyspnea    GERD (gastroesophageal reflux disease)    HTN (hypertension) 12/08/2019   Hyperlipidemia    Hypertension    Mass of upper lobe of right lung 04/02/2021   spiculated mass RIGHT posterior pulmonary apex with associated hilar and pretracheal LAD; measured 3.2 x 2.8 cm   Obesity (BMI 30-39.9)    T2DM (type 2 diabetes mellitus) ([facility]) 12/08/2019    SURGICAL HISTORY: Past Surgical History:  Procedure Laterality Date   BACK SURGERY     lumbar L5-S1 ruptured disc x 3 surgeries   CERVICAL FUSION  2009   FINE NEEDLE ASPIRATION  06/23/2021    Procedure: FINE NEEDLE ASPIRATION (FNA) LINEAR;  Surgeon: Garner Nash, DO;  Location: [facility];  Service: Pulmonary;;   INTERCOSTAL NERVE BLOCK Right 07/09/2021   Procedure: INTERCOSTAL NERVE BLOCK;  Surgeon: Lajuana Matte, MD;  Location: [facility];  Service: Thoracic;  Laterality: Right;   IR IMAGING GUIDED PORT INSERTION  08/06/2021   LAPAROSCOPIC TOTAL HYSTERECTOMY  1998   with oophorectomy   LYMPH NODE DISSECTION Right 07/09/2021   Procedure: LYMPH NODE DISSECTION;  Surgeon: Lajuana Matte, MD;  Location: [facility];  Service: Thoracic;  Laterality: Right;   TONSILLECTOMY     VIDEO BRONCHOSCOPY WITH ENDOBRONCHIAL NAVIGATION N/A 04/15/2021   Procedure: VIDEO BRONCHOSCOPY WITH ENDOBRONCHIAL NAVIGATION;  Surgeon: Ottie Glazier, MD;  Location: [facility];  Service: Thoracic;  Laterality: N/A;   VIDEO BRONCHOSCOPY WITH ENDOBRONCHIAL ULTRASOUND N/A 04/15/2021   Procedure: VIDEO BRONCHOSCOPY WITH ENDOBRONCHIAL ULTRASOUND;  Surgeon: Ottie Glazier, MD;  Location: [facility];  Service: Thoracic;  Laterality: N/A;   VIDEO BRONCHOSCOPY WITH ENDOBRONCHIAL ULTRASOUND N/A  06/23/2021   Procedure: VIDEO BRONCHOSCOPY WITH ENDOBRONCHIAL ULTRASOUND;  Surgeon: Garner Nash, DO;  Location: [facility];  Service: Pulmonary;  Laterality: N/A;    SOCIAL HISTORY: Social History   Socioeconomic History   Marital status: Married    Spouse name: Dwight   Number of children: 3   Years of education: Not on file   Highest education level: Not on file  Occupational History   Not on file  Tobacco Use   Smoking status: Former    Packs/day: 3.00    Years: 50.00    Pack years: 150.00    Types: Cigarettes    Quit date: 2019    Years since quitting: 3.9   Smokeless tobacco: Never  Vaping Use   Vaping Use: Never used  Substance and Sexual Activity   Alcohol use: Not Currently   Drug use: Never   Sexual activity: Not on file    Comment: Hysterectomy  Other Topics Concern   Not on file  Social  History Narrative   Lives in home; with husband; lives in [place]; never smoking 2019; no alcohol. 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    Social Determinants of Health   Financial Resource Strain: Not on file  Food Insecurity: Not on file  Transportation Needs: Not on file  Physical Activity: Not on file  Stress: Not on file  Social Connections: Not on file  Intimate Partner Violence: Not on file    FAMILY HISTORY: Family History  Problem Relation Age of Onset   Bone cancer Maternal Uncle     ALLERGIES:  is allergic to ibuprofen and macrobid [nitrofurantoin].  MEDICATIONS:  Current Outpatient Medications  Medication Sig Dispense Refill   aspirin EC 81 MG tablet Take 81 mg by mouth at bedtime.     clonazePAM (KLONOPIN) 0.5 MG tablet Take 0.5 mg by mouth 3 (three) times daily as needed for anxiety.     dexamethasone (DECADRON) 4 MG tablet Take 2 tablets (8 mg total) by mouth daily. Start the day after carboplatin chemotherapy for 3 days. 30 tablet 1   docusate sodium (COLACE) 100 MG capsule Take 200 mg by mouth at bedtime.     EPINEPHrine 0.3 mg/0.3 mL IJ SOAJ injection Inject 0.3 mLs (0.3 mg total) into the muscle as needed for anaphylaxis. 2 each 0   ergocalciferol (VITAMIN D2) 1.25 MG (50000 UT) capsule Take 1 capsule (50,000 Units total) by mouth once a week. 12 capsule 1   fluticasone-salmeterol (ADVAIR) 250-50 MCG/ACT AEPB Inhale 1 puff into the lungs in the morning and at bedtime.     gabapentin (NEURONTIN) 300 MG capsule Take 300 mg by mouth 3 (three) times daily.     hydrochlorothiazide (HYDRODIURIL) 25 MG tablet Take 25 mg by mouth daily.     ipratropium-albuterol (DUONEB) 0.5-2.5 (3) MG/3ML SOLN Take 3 mLs by nebulization 3 (three) times daily.     lidocaine-prilocaine (EMLA) cream Apply to affected area once 30 g 3   metFORMIN (GLUCOPHAGE) 500 MG tablet Take 1 tablet (500 mg total) by mouth 2 (two) times daily with a meal. 60 tablet 3    nystatin cream (MYCOSTATIN) Apply 1 application topically 2 (two) times daily as needed for dry skin (around lips).     pantoprazole (PROTONIX) 40 MG tablet Take 40 mg by mouth at bedtime.     potassium chloride SA (KLOR-CON) 20 MEQ tablet Take 20 mEq by mouth 2 (two) times daily.     pravastatin (PRAVACHOL) 80 MG  tablet Take 80 mg by mouth at bedtime.     prochlorperazine (COMPAZINE) 10 MG tablet Take 1 tablet (10 mg total) by mouth every 6 (six) hours as needed (Nausea or vomiting). 30 tablet 1   tiZANidine (ZANAFLEX) 4 MG tablet Take 4 mg by mouth 3 (three) times daily.     venlafaxine XR (EFFEXOR-XR) 75 MG 24 hr capsule Take 75 mg by mouth at bedtime.     albuterol (VENTOLIN HFA) 108 (90 Base) MCG/ACT inhaler Inhale 1 puff into the lungs every 6 (six) hours as needed for wheezing. (Patient not taking: Reported on 09/03/2021)     Ascorbic Acid (VITAMIN C) 1000 MG tablet Take 1,000 mg by mouth in the morning and at bedtime. (Patient not taking: Reported on 08/13/2021)     cetirizine-pseudoephedrine (ZYRTEC-D) 5-120 MG tablet Take 1 tablet by mouth at bedtime. (Patient not taking: Reported on 08/13/2021)     Melatonin 10 MG TABS Take 10 mg by mouth at bedtime. (Patient not taking: Reported on 08/21/2021)     Multiple Vitamin (MULTI-VITAMIN) tablet Take 1 tablet by mouth at bedtime. (Patient not taking: Reported on 08/13/2021)     No current facility-administered medications for this visit.      Marland Kitchen  PHYSICAL EXAMINATION: ECOG PERFORMANCE STATUS: 1 - Symptomatic but completely ambulatory  Vitals:   09/03/21 0902 09/03/21 0904  BP: 116/63 118/61  Pulse: 77   Resp: 18   Temp: (!) 97.4 F (36.3 C)   SpO2: 100% 100%   Filed Weights   09/03/21 0902  Weight: 184 lb (83.5 kg)    Physical Exam Vitals and nursing note reviewed.  Constitutional:      Comments: Patient is accompanied by daughter.  Ambulating independently.  HENT:     Head: Normocephalic and atraumatic.     Mouth/Throat:      Mouth: Mucous membranes are moist.     Pharynx: Oropharynx is clear. No oropharyngeal exudate.  Eyes:     Extraocular Movements: Extraocular movements intact.     Pupils: Pupils are equal, round, and reactive to light.  Cardiovascular:     Rate and Rhythm: Normal rate and regular rhythm.  Pulmonary:     Effort: No respiratory distress.     Breath sounds: No wheezing.     Comments: Decreased breath sounds bilaterally at bases.  No wheeze or crackles Abdominal:     General: Bowel sounds are normal. There is no distension.     Palpations: Abdomen is soft. There is no mass.     Tenderness: There is no abdominal tenderness. There is no guarding or rebound.  Musculoskeletal:        General: No tenderness. Normal range of motion.     Cervical back: Normal range of motion and neck supple.  Skin:    General: Skin is warm.  Neurological:     General: No focal deficit present.     Mental Status: Olivia Buchanan is alert and oriented to person, place, and time.     Comments: 4-5 strength in the left lower extremity/chronic.  Psychiatric:        Mood and Affect: Affect normal.        Behavior: Behavior normal.        Judgment: Judgment normal.     LABORATORY DATA:  I have reviewed the data as listed Lab Results  Component Value Date   WBC 7.3 09/03/2021   HGB 11.9 (L) 09/03/2021   HCT 35.8 (L) 09/03/2021   MCV 90.9 09/03/2021  PLT 201 09/03/2021   Recent Labs    07/11/21 0112 07/13/21 0804 08/13/21 0837 09/03/21 0806  NA 137 138 137 139  K 3.5 3.6 3.5 3.8  CL 104 105 97* 101  CO2 _0 GLUCOSE 170* 187* 175* 173*  BUN 17 6* 14 10  CREATININE 0.90 0.62 0.63 0.58  CALCIUM 8.5* 8.3* 9.2 9.1  GFRNONAA >60 >60 >60 >60  PROT 5.7*  --  7.6 7.5  ALBUMIN 2.5*  --  3.9 3.7  AST 25  --  38 33  ALT 13  --  21 20  ALKPHOS 43  --  61 77  BILITOT 0.4  --  0.5 0.4    RADIOGRAPHIC STUDIES: I have personally reviewed the radiological images as listed and agreed with the findings in  the report. DG Chest 2 View  Result Date: 08/21/2021 CLINICAL DATA:  Previous resection of right lung mass EXAM: CHEST - 2 VIEW COMPARISON:  Previous studies including the examination of 07/17/2021 FINDINGS: Cardiac size is within normal limits. There are no signs of pulmonary edema. There is interval clearing of patchy infiltrate in the right parahilar region. There are no new infiltrates. There is no pleural effusion or pneumothorax. Tip of right chest port is seen in the superior vena cava close to the right atrium. IMPRESSION: There are no signs of pulmonary edema or focal pulmonary consolidation. Electronically Signed   By: Elmer Picker M.D.   On: 08/21/2021 11:09   IR IMAGING GUIDED PORT INSERTION  Result Date: 08/06/2021 CLINICAL DATA:  Squamous lung carcinoma of the right upper lobe and need for porta cath for chemotherapy. EXAM: IMPLANTED PORT A CATH PLACEMENT WITH ULTRASOUND AND FLUOROSCOPIC GUIDANCE ANESTHESIA/SEDATION: Moderate (conscious) sedation was employed during this procedure. A total of Versed 1.0 mg and Fentanyl 50 mcg was administered intravenously by radiology nursing under my direct supervision. Moderate Sedation Time: 24 minutes. The patient's level of consciousness and vital signs were monitored continuously by radiology nursing throughout the procedure under my direct supervision. FLUOROSCOPY TIME:  36 seconds.  5.8 mGy. PROCEDURE: The procedure, risks, benefits, and alternatives were explained to the patient. Questions regarding the procedure were encouraged and answered. The patient understands and consents to the procedure. A time-out was performed prior to initiating the procedure. Ultrasound was utilized to confirm patency of the right internal jugular vein. The right neck and chest were prepped with chlorhexidine in a sterile fashion, and a sterile drape was applied covering the operative field. Maximum barrier sterile technique with sterile gowns and gloves were used  for the procedure. Local anesthesia was provided with 1% lidocaine. After creating a small venotomy incision, a 21 gauge needle was advanced into the right internal jugular vein under direct, real-time ultrasound guidance. Ultrasound image documentation was performed. After securing guidewire access, an 8 Fr dilator was placed. A J-wire was kinked to measure appropriate catheter length. A subcutaneous port pocket was then created along the upper chest wall utilizing sharp and blunt dissection. Portable cautery was utilized. The pocket was irrigated with sterile saline. A single lumen power injectable port was chosen for placement. The 8 Fr catheter was tunneled from the port pocket site to the venotomy incision. The port was placed in the pocket. External catheter was trimmed to appropriate length based on guidewire measurement. At the venotomy, an 8 Fr peel-away sheath was placed over a guidewire. The catheter was then placed through the sheath and the sheath removed. Final catheter positioning was confirmed  and documented with a fluoroscopic spot image. The port was accessed with a needle and aspirated and flushed with heparinized saline. The access needle was removed. The venotomy and port pocket incisions were closed with subcutaneous 3-0 Monocryl and subcuticular 4-0 Vicryl. Dermabond was applied to both incisions. COMPLICATIONS: COMPLICATIONS None FINDINGS: After catheter placement, the tip lies at the cavo-atrial junction. The catheter aspirates normally and is ready for immediate use. IMPRESSION: Placement of single lumen port a cath via right internal jugular vein. The catheter tip lies at the cavo-atrial junction. A power injectable port a cath was placed and is ready for immediate use. Electronically Signed   By: Aletta Edouard M.D.   On: 08/06/2021 11:35    ASSESSMENT & PLAN:   Malignant neoplasm of upper lobe of right lung ([place]) # pT3N0 [4.5cm; Squamous cell ca- + involvement of the parietal  pleura]; stage II. Currently on adjuvant carbo-taxol x4 [poor candidate for cisplatin]. n PD-L1-5% will benefit from immunotherapy post 4cycles of chemo.   # Proceed with carbo-taxol cycle #2. Labs today reviewed;  acceptable for treatment today.   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 DC udenyca; start vit D 50,000.   # chronic back pain/chronic left LE/Bil PN-history of cervical spine injury-no evidence of metastatic disease.  Continue hydrocodone [for now as per Dr. Doy Hutching. STABLE  #History of diabetes- continue metformin 500 mg BID  # DISPOSITION:pt pref/X-mas #  carbo-taxol;  # cancel- udenyca tomorrow.  # follow up on 27th- MD; labs- cbc/cmp- Carbo-taxol ; D-2 [name]- Dr.B   Cc: Dr.A/Dr.Sparks      All questions were answered. The patient knows to call the clinic with any problems, questions or concerns.       Cammie Sickle, MD 09/04/2021 3:16 PM

## 2021-09-29 ENCOUNTER — Inpatient Hospital Stay: Payer: Medicare HMO

## 2021-09-29 ENCOUNTER — Encounter: Payer: Self-pay | Admitting: Licensed Clinical Social Worker

## 2021-09-29 ENCOUNTER — Other Ambulatory Visit: Payer: Self-pay

## 2021-09-29 ENCOUNTER — Encounter: Payer: Self-pay | Admitting: Internal Medicine

## 2021-09-29 ENCOUNTER — Inpatient Hospital Stay (HOSPITAL_BASED_OUTPATIENT_CLINIC_OR_DEPARTMENT_OTHER): Payer: Medicare HMO | Admitting: Internal Medicine

## 2021-09-29 DIAGNOSIS — G620 Drug-induced polyneuropathy: Secondary | ICD-10-CM | POA: Diagnosis not present

## 2021-09-29 DIAGNOSIS — C3411 Malignant neoplasm of upper lobe, right bronchus or lung: Secondary | ICD-10-CM

## 2021-09-29 DIAGNOSIS — D491 Neoplasm of unspecified behavior of respiratory system: Secondary | ICD-10-CM

## 2021-09-29 DIAGNOSIS — I2510 Atherosclerotic heart disease of native coronary artery without angina pectoris: Secondary | ICD-10-CM | POA: Diagnosis not present

## 2021-09-29 DIAGNOSIS — Z5111 Encounter for antineoplastic chemotherapy: Secondary | ICD-10-CM | POA: Diagnosis not present

## 2021-09-29 DIAGNOSIS — E119 Type 2 diabetes mellitus without complications: Secondary | ICD-10-CM | POA: Diagnosis not present

## 2021-09-29 DIAGNOSIS — J449 Chronic obstructive pulmonary disease, unspecified: Secondary | ICD-10-CM | POA: Diagnosis not present

## 2021-09-29 LAB — CBC WITH DIFFERENTIAL/PLATELET
Abs Immature Granulocytes: 0.01 10*3/uL (ref 0.00–0.07)
Basophils Absolute: 0 10*3/uL (ref 0.0–0.1)
Basophils Relative: 1 %
Eosinophils Absolute: 0.2 10*3/uL (ref 0.0–0.5)
Eosinophils Relative: 4 %
HCT: 32.3 % — ABNORMAL LOW (ref 36.0–46.0)
Hemoglobin: 10.9 g/dL — ABNORMAL LOW (ref 12.0–15.0)
Immature Granulocytes: 0 %
Lymphocytes Relative: 38 %
Lymphs Abs: 1.7 10*3/uL (ref 0.7–4.0)
MCH: 30.8 pg (ref 26.0–34.0)
MCHC: 33.7 g/dL (ref 30.0–36.0)
MCV: 91.2 fL (ref 80.0–100.0)
Monocytes Absolute: 0.5 10*3/uL (ref 0.1–1.0)
Monocytes Relative: 11 %
Neutro Abs: 2.1 10*3/uL (ref 1.7–7.7)
Neutrophils Relative %: 46 %
Platelets: 190 10*3/uL (ref 150–400)
RBC: 3.54 MIL/uL — ABNORMAL LOW (ref 3.87–5.11)
RDW: 16.2 % — ABNORMAL HIGH (ref 11.5–15.5)
WBC: 4.5 10*3/uL (ref 4.0–10.5)
nRBC: 0 % (ref 0.0–0.2)

## 2021-09-29 LAB — COMPREHENSIVE METABOLIC PANEL
ALT: 19 U/L (ref 0–44)
AST: 38 U/L (ref 15–41)
Albumin: 3.6 g/dL (ref 3.5–5.0)
Alkaline Phosphatase: 57 U/L (ref 38–126)
Anion gap: 11 (ref 5–15)
BUN: 11 mg/dL (ref 8–23)
CO2: 27 mmol/L (ref 22–32)
Calcium: 8.7 mg/dL — ABNORMAL LOW (ref 8.9–10.3)
Chloride: 96 mmol/L — ABNORMAL LOW (ref 98–111)
Creatinine, Ser: 0.66 mg/dL (ref 0.44–1.00)
GFR, Estimated: >60 mL/min (ref >60)
Glucose, Bld: 206 mg/dL — ABNORMAL HIGH (ref 70–99)
Potassium: 3.8 mmol/L (ref 3.5–5.1)
Sodium: 134 mmol/L — ABNORMAL LOW (ref 135–145)
Total Bilirubin: 0.5 mg/dL (ref 0.3–1.2)
Total Protein: 7.3 g/dL (ref 6.5–8.1)

## 2021-09-29 MED ORDER — DIPHENHYDRAMINE HCL 50 MG/ML IJ SOLN
50.0000 mg | Freq: Once | INTRAMUSCULAR | Status: AC
  Administered 2021-09-29: 11:00:00 50 mg via INTRAVENOUS
  Filled 2021-09-29: qty 1

## 2021-09-29 MED ORDER — HEPARIN SOD (PORK) LOCK FLUSH 100 UNIT/ML IV SOLN
500.0000 [IU] | Freq: Once | INTRAVENOUS | Status: AC | PRN
  Administered 2021-09-29: 15:00:00 500 [IU]
  Filled 2021-09-29: qty 5

## 2021-09-29 MED ORDER — SODIUM CHLORIDE 0.9 % IV SOLN
585.0000 mg | Freq: Once | INTRAVENOUS | Status: AC
  Administered 2021-09-29: 15:00:00 590 mg via INTRAVENOUS
  Filled 2021-09-29: qty 59

## 2021-09-29 MED ORDER — SODIUM CHLORIDE 0.9 % IV SOLN
200.0000 mg/m2 | Freq: Once | INTRAVENOUS | Status: AC
  Administered 2021-09-29: 11:00:00 378 mg via INTRAVENOUS
  Filled 2021-09-29: qty 63

## 2021-09-29 MED ORDER — PALONOSETRON HCL INJECTION 0.25 MG/5ML
0.2500 mg | Freq: Once | INTRAVENOUS | Status: AC
  Administered 2021-09-29: 10:00:00 0.25 mg via INTRAVENOUS
  Filled 2021-09-29: qty 5

## 2021-09-29 MED ORDER — FAMOTIDINE 20 MG IN NS 100 ML IVPB
20.0000 mg | Freq: Once | INTRAVENOUS | Status: AC
  Administered 2021-09-29: 20 mg via INTRAVENOUS
  Filled 2021-09-29: qty 20

## 2021-09-29 MED ORDER — SODIUM CHLORIDE 0.9 % IV SOLN
Freq: Once | INTRAVENOUS | Status: AC
  Filled 2021-09-29: qty 250

## 2021-09-29 MED ORDER — SODIUM CHLORIDE 0.9 % IV SOLN
150.0000 mg | Freq: Once | INTRAVENOUS | Status: AC
  Administered 2021-09-29: 10:00:00 150 mg via INTRAVENOUS
  Filled 2021-09-29: qty 5

## 2021-09-29 MED ORDER — SODIUM CHLORIDE 0.9 % IV SOLN
10.0000 mg | Freq: Once | INTRAVENOUS | Status: AC
  Administered 2021-09-29: 10:00:00 10 mg via INTRAVENOUS
  Filled 2021-09-29: qty 10

## 2021-09-29 NOTE — Patient Instructions (Signed)
[facility] CANCER CTR AT [place]  Discharge Instructions: Thank you for choosing [facility] to provide your oncology and hematology care.  If you have a lab appointment with the [facility], please go directly to the [facility] and check in at the registration area.  Wear comfortable clothing and clothing appropriate for easy access to any Portacath or PICC line.   We strive to give you quality time with your provider. You may need to reschedule your appointment if you arrive late (15 or more minutes).  Arriving late affects you and other patients whose appointments are after yours.  Also, if you miss three or more appointments without notifying the office, you may be dismissed from the clinic at the providers discretion.      For prescription refill requests, have your pharmacy contact our office and allow 72 hours for refills to be completed.    Today you received the following chemotherapy and/or immunotherapy agents taxol, carboplatin      To help prevent nausea and vomiting after your treatment, we encourage you to take your nausea medication as directed.  BELOW ARE SYMPTOMS THAT SHOULD BE REPORTED IMMEDIATELY: *FEVER GREATER THAN 100.4 F (38 C) OR HIGHER *CHILLS OR SWEATING *NAUSEA AND VOMITING THAT IS NOT CONTROLLED WITH YOUR NAUSEA MEDICATION *UNUSUAL SHORTNESS OF BREATH *UNUSUAL BRUISING OR BLEEDING *URINARY PROBLEMS (pain or burning when urinating, or frequent urination) *BOWEL PROBLEMS (unusual diarrhea, constipation, pain near the anus) TENDERNESS IN MOUTH AND THROAT WITH OR WITHOUT PRESENCE OF ULCERS (sore throat, sores in mouth, or a toothache) UNUSUAL RASH, SWELLING OR PAIN  UNUSUAL VAGINAL DISCHARGE OR ITCHING   Items with * indicate a potential emergency and should be followed up as soon as possible or go to the Emergency Department if any problems should occur.  Please show the CHEMOTHERAPY ALERT CARD or IMMUNOTHERAPY ALERT CARD at  check-in to the Emergency Department and triage nurse.  Should you have questions after your visit or need to cancel or reschedule your appointment, please contact [facility] CANCER [place] AT [place]  [phone] and follow the prompts.  Office hours are 8:00 a.m. to 4:30 p.m. Monday - Friday. Please note that voicemails left after 4:00 p.m. may not be returned until the following business day.  We are closed weekends and major holidays. You have access to a nurse at all times for urgent questions. Please call the main number to the clinic [phone] and follow the prompts.  For any non-urgent questions, you may also contact your provider using MyChart. We now offer e-Visits for anyone 77 and older to request care online for non-urgent symptoms. For details visit [email or web address].   Also download the MyChart app! Go to the app store, search "MyChart", open the app, select [facility], and log in with your MyChart username and password.  Due to Covid, a mask is required upon entering the hospital/clinic. If you do not have a mask, one will be given to you upon arrival. For doctor visits, patients may have 1 support person aged 48 or older with them. For treatment visits, patients cannot have anyone with them due to current Covid guidelines and our immunocompromised population.

## 2021-09-29 NOTE — Progress Notes (Signed)
[facility] Work  Clinical Social Work was referred by Dr. Rogue Bussing for assessment of psychosocial needs and safety concerns.  Clinical Social Worker contacted patient by phone  to offer support and assess for needs.  CSW left a voicemail for patient requesting a return call      Adelene Amas, [place]
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
## 2021-09-29 NOTE — Progress Notes (Signed)
Right shoulder pain with swelling for the past week.  Episodes of pain radiates from shoulder to hand.
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
## 2021-09-30 ENCOUNTER — Inpatient Hospital Stay: Payer: Medicare HMO

## 2021-10-02 ENCOUNTER — Encounter: Payer: Self-pay | Admitting: Licensed Clinical Social Worker

## 2021-10-14 ENCOUNTER — Other Ambulatory Visit: Payer: Self-pay | Admitting: *Deleted

## 2021-10-20 ENCOUNTER — Inpatient Hospital Stay (HOSPITAL_BASED_OUTPATIENT_CLINIC_OR_DEPARTMENT_OTHER): Payer: Medicare HMO | Admitting: Internal Medicine

## 2021-10-20 ENCOUNTER — Other Ambulatory Visit: Payer: Self-pay | Admitting: Internal Medicine

## 2021-10-20 ENCOUNTER — Inpatient Hospital Stay: Payer: Medicare HMO

## 2021-10-20 ENCOUNTER — Encounter: Payer: Self-pay | Admitting: Internal Medicine

## 2021-10-20 ENCOUNTER — Other Ambulatory Visit: Payer: Self-pay

## 2021-10-20 ENCOUNTER — Inpatient Hospital Stay: Payer: Medicare HMO | Attending: Internal Medicine

## 2021-10-20 DIAGNOSIS — E876 Hypokalemia: Secondary | ICD-10-CM | POA: Diagnosis not present

## 2021-10-20 DIAGNOSIS — C3411 Malignant neoplasm of upper lobe, right bronchus or lung: Secondary | ICD-10-CM

## 2021-10-20 DIAGNOSIS — D491 Neoplasm of unspecified behavior of respiratory system: Secondary | ICD-10-CM

## 2021-10-20 DIAGNOSIS — J449 Chronic obstructive pulmonary disease, unspecified: Secondary | ICD-10-CM | POA: Diagnosis not present

## 2021-10-20 DIAGNOSIS — Z5111 Encounter for antineoplastic chemotherapy: Secondary | ICD-10-CM | POA: Insufficient documentation

## 2021-10-20 DIAGNOSIS — I2510 Atherosclerotic heart disease of native coronary artery without angina pectoris: Secondary | ICD-10-CM | POA: Insufficient documentation

## 2021-10-20 LAB — CBC WITH DIFFERENTIAL/PLATELET
Abs Immature Granulocytes: 0.05 10*3/uL (ref 0.00–0.07)
Basophils Absolute: 0.1 10*3/uL (ref 0.0–0.1)
Basophils Relative: 1 %
Eosinophils Absolute: 0.2 10*3/uL (ref 0.0–0.5)
Eosinophils Relative: 2 %
HCT: 32.6 % — ABNORMAL LOW (ref 36.0–46.0)
Hemoglobin: 11.2 g/dL — ABNORMAL LOW (ref 12.0–15.0)
Immature Granulocytes: 1 %
Lymphocytes Relative: 31 %
Lymphs Abs: 2.6 10*3/uL (ref 0.7–4.0)
MCH: 31.5 pg (ref 26.0–34.0)
MCHC: 34.4 g/dL (ref 30.0–36.0)
MCV: 91.6 fL (ref 80.0–100.0)
Monocytes Absolute: 0.9 10*3/uL (ref 0.1–1.0)
Monocytes Relative: 11 %
Neutro Abs: 4.5 10*3/uL (ref 1.7–7.7)
Neutrophils Relative %: 54 %
Platelets: 145 10*3/uL — ABNORMAL LOW (ref 150–400)
RBC: 3.56 MIL/uL — ABNORMAL LOW (ref 3.87–5.11)
RDW: 16.1 % — ABNORMAL HIGH (ref 11.5–15.5)
WBC: 8.3 10*3/uL (ref 4.0–10.5)
nRBC: 0 % (ref 0.0–0.2)

## 2021-10-20 LAB — COMPREHENSIVE METABOLIC PANEL
ALT: 22 U/L (ref 0–44)
AST: 38 U/L (ref 15–41)
Albumin: 4 g/dL (ref 3.5–5.0)
Alkaline Phosphatase: 58 U/L (ref 38–126)
Anion gap: 11 (ref 5–15)
BUN: 9 mg/dL (ref 8–23)
CO2: 28 mmol/L (ref 22–32)
Calcium: 8.9 mg/dL (ref 8.9–10.3)
Chloride: 97 mmol/L — ABNORMAL LOW (ref 98–111)
Creatinine, Ser: 0.66 mg/dL (ref 0.44–1.00)
GFR, Estimated: >60 mL/min (ref >60)
Glucose, Bld: 167 mg/dL — ABNORMAL HIGH (ref 70–99)
Potassium: 3.3 mmol/L — ABNORMAL LOW (ref 3.5–5.1)
Sodium: 136 mmol/L (ref 135–145)
Total Bilirubin: 0.6 mg/dL (ref 0.3–1.2)
Total Protein: 7.6 g/dL (ref 6.5–8.1)

## 2021-10-20 MED ORDER — SODIUM CHLORIDE 0.9% FLUSH
10.0000 mL | INTRAVENOUS | Status: DC | PRN
  Filled 2021-10-20: qty 10

## 2021-10-20 MED ORDER — HEPARIN SOD (PORK) LOCK FLUSH 100 UNIT/ML IV SOLN
INTRAVENOUS | Status: AC
  Administered 2021-10-20: 500 [IU]
  Filled 2021-10-20: qty 5

## 2021-10-20 MED ORDER — SODIUM CHLORIDE 0.9 % IV SOLN
200.0000 mg/m2 | Freq: Once | INTRAVENOUS | Status: AC
  Administered 2021-10-20: 378 mg via INTRAVENOUS
  Filled 2021-10-20: qty 63

## 2021-10-20 MED ORDER — SODIUM CHLORIDE 0.9 % IV SOLN
Freq: Once | INTRAVENOUS | Status: AC
  Filled 2021-10-20: qty 250

## 2021-10-20 MED ORDER — SODIUM CHLORIDE 0.9 % IV SOLN
585.0000 mg | Freq: Once | INTRAVENOUS | Status: AC
  Administered 2021-10-20: 590 mg via INTRAVENOUS
  Filled 2021-10-20: qty 59

## 2021-10-20 MED ORDER — FAMOTIDINE IN NACL 20-0.9 MG/50ML-% IV SOLN
20.0000 mg | Freq: Once | INTRAVENOUS | Status: AC
  Administered 2021-10-20: 20 mg via INTRAVENOUS
  Filled 2021-10-20: qty 50

## 2021-10-20 MED ORDER — HEPARIN SOD (PORK) LOCK FLUSH 100 UNIT/ML IV SOLN
500.0000 [IU] | Freq: Once | INTRAVENOUS | Status: AC | PRN
  Filled 2021-10-20: qty 5

## 2021-10-20 MED ORDER — SODIUM CHLORIDE 0.9 % IV SOLN
150.0000 mg | Freq: Once | INTRAVENOUS | Status: AC
  Administered 2021-10-20: 150 mg via INTRAVENOUS
  Filled 2021-10-20: qty 150

## 2021-10-20 MED ORDER — PALONOSETRON HCL INJECTION 0.25 MG/5ML
0.2500 mg | Freq: Once | INTRAVENOUS | Status: AC
  Administered 2021-10-20: 0.25 mg via INTRAVENOUS
  Filled 2021-10-20: qty 5

## 2021-10-20 MED ORDER — SODIUM CHLORIDE 0.9 % IV SOLN
10.0000 mg | Freq: Once | INTRAVENOUS | Status: AC
  Administered 2021-10-20: 10 mg via INTRAVENOUS
  Filled 2021-10-20: qty 10

## 2021-10-20 MED ORDER — DIPHENHYDRAMINE HCL 50 MG/ML IJ SOLN
50.0000 mg | Freq: Once | INTRAMUSCULAR | Status: AC
  Administered 2021-10-20: 50 mg via INTRAVENOUS
  Filled 2021-10-20: qty 1

## 2021-10-20 NOTE — Patient Instructions (Signed)
[facility] [place]  Discharge Instructions: Thank you for choosing [facility] to provide your oncology and hematology care.  If you have a lab appointment with the [facility], please go directly to the [facility] and check in at the registration area.  Wear comfortable clothing and clothing appropriate for easy access to any Portacath or PICC line.   We strive to give you quality time with your provider. You may need to reschedule your appointment if you arrive late (15 or more minutes).  Arriving late affects you and other patients whose appointments are after yours.  Also, if you miss three or more appointments without notifying the office, you may be dismissed from the clinic at the providers discretion.      For prescription refill requests, have your pharmacy contact our office and allow 72 hours for refills to be completed.    Today you received the following chemotherapy and/or immunotherapy agents - paclitaxel, carboplatin      To help prevent nausea and vomiting after your treatment, we encourage you to take your nausea medication as directed.  BELOW ARE SYMPTOMS THAT SHOULD BE REPORTED IMMEDIATELY: *FEVER GREATER THAN 100.4 F (38 C) OR HIGHER *CHILLS OR SWEATING *NAUSEA AND VOMITING THAT IS NOT CONTROLLED WITH YOUR NAUSEA MEDICATION *UNUSUAL SHORTNESS OF BREATH *UNUSUAL BRUISING OR BLEEDING *URINARY PROBLEMS (pain or burning when urinating, or frequent urination) *BOWEL PROBLEMS (unusual diarrhea, constipation, pain near the anus) TENDERNESS IN MOUTH AND THROAT WITH OR WITHOUT PRESENCE OF ULCERS (sore throat, sores in mouth, or a toothache) UNUSUAL RASH, SWELLING OR PAIN  UNUSUAL VAGINAL DISCHARGE OR ITCHING   Items with * indicate a potential emergency and should be followed up as soon as possible or go to the Emergency Department if any problems should occur.  Please show the CHEMOTHERAPY ALERT CARD or IMMUNOTHERAPY ALERT CARD  at check-in to the Emergency Department and triage nurse.  Should you have questions after your visit or need to cancel or reschedule your appointment, please contact [facility] [place] [facility] [place]  [phone] and follow the prompts.  Office hours are 8:00 a.m. to 4:30 p.m. Monday - Friday. Please note that voicemails left after 4:00 p.m. may not be returned until the following business day.  We are closed weekends and major holidays. You have access to a nurse at all times for urgent questions. Please call the main number to the clinic [phone] and follow the prompts.  For any non-urgent questions, you may also contact your provider using MyChart. We now offer e-Visits for anyone 1 and older to request care online for non-urgent symptoms. For details visit [email or web address].   Also download the MyChart app! Go to the app store, search "MyChart", open the app, select [facility], and log in with your MyChart username and password.  Due to Covid, a mask is required upon entering the hospital/clinic. If you do not have a mask, one will be given to you upon arrival. For doctor visits, patients may have 1 support person aged 63 or older with them. For treatment visits, patients cannot have anyone with them due to current Covid guidelines and our immunocompromised population.   Paclitaxel injection What is this medication? PACLITAXEL (PAK li TAX el) is a chemotherapy drug. It targets fast dividing cells, like cancer cells, and causes these cells to die. This medicine is used to treat ovarian cancer, breast cancer, lung cancer, Kaposi's sarcoma, and other cancers. This medicine may be used for other  purposes; ask your health care provider or pharmacist if you have questions. COMMON BRAND NAME(S): Onxol, Taxol What should I tell my care team before I take this medication? They need to know if you have any of these conditions: history of irregular heartbeat liver disease low  blood counts, like low white cell, platelet, or red cell counts lung or breathing disease, like asthma tingling of the fingers or toes, or other nerve disorder an unusual or allergic reaction to paclitaxel, alcohol, polyoxyethylated castor oil, other chemotherapy, other medicines, foods, dyes, or preservatives pregnant or trying to get pregnant breast-feeding How should I use this medication? This drug is given as an infusion into a vein. It is administered in a hospital or clinic by a specially trained health care professional. Talk to your pediatrician regarding the use of this medicine in children. Special care may be needed. Overdosage: If you think you have taken too much of this medicine contact a poison control center or emergency room at once. NOTE: This medicine is only for you. Do not share this medicine with others. What if I miss a dose? It is important not to miss your dose. Call your doctor or health care professional if you are unable to keep an appointment. What may interact with this medication? Do not take this medicine with any of the following medications: live virus vaccines This medicine may also interact with the following medications: antiviral medicines for hepatitis, HIV or AIDS certain antibiotics like erythromycin and clarithromycin certain medicines for fungal infections like ketoconazole and itraconazole certain medicines for seizures like carbamazepine, phenobarbital, phenytoin gemfibrozil nefazodone rifampin St. John's wort This list may not describe all possible interactions. Give your health care provider a list of all the medicines, herbs, non-prescription drugs, or dietary supplements you use. Also tell them if you smoke, drink alcohol, or use illegal drugs. Some items may interact with your medicine. What should I watch for while using this medication? Your condition will be monitored carefully while you are receiving this medicine. You will need  important blood work done while you are taking this medicine. This medicine can cause serious allergic reactions. To reduce your risk you will need to take other medicine(s) before treatment with this medicine. If you experience allergic reactions like skin rash, itching or hives, swelling of the face, lips, or tongue, tell your doctor or health care professional right away. In some cases, you may be given additional medicines to help with side effects. Follow all directions for their use. This drug may make you feel generally unwell. This is not uncommon, as chemotherapy can affect healthy cells as well as cancer cells. Report any side effects. Continue your course of treatment even though you feel ill unless your doctor tells you to stop. Call your doctor or health care professional for advice if you get a fever, chills or sore throat, or other symptoms of a cold or flu. Do not treat yourself. This drug decreases your body's ability to fight infections. Try to avoid being around people who are sick. This medicine may increase your risk to bruise or bleed. Call your doctor or health care professional if you notice any unusual bleeding. Be careful brushing and flossing your teeth or using a toothpick because you may get an infection or bleed more easily. If you have any dental work done, tell your dentist you are receiving this medicine. Avoid taking products that contain aspirin, acetaminophen, ibuprofen, naproxen, or ketoprofen unless instructed by your doctor. These medicines may  hide a fever. Do not become pregnant while taking this medicine. Women should inform their doctor if they wish to become pregnant or think they might be pregnant. There is a potential for serious side effects to an unborn child. Talk to your health care professional or pharmacist for more information. Do not breast-feed an infant while taking this medicine. Men are advised not to father a child while receiving this  medicine. This product may contain alcohol. Ask your pharmacist or healthcare provider if this medicine contains alcohol. Be sure to tell all healthcare providers you are taking this medicine. Certain medicines, like metronidazole and disulfiram, can cause an unpleasant reaction when taken with alcohol. The reaction includes flushing, headache, nausea, vomiting, sweating, and increased thirst. The reaction can last from 30 minutes to several hours. What side effects may I notice from receiving this medication? Side effects that you should report to your doctor or health care professional as soon as possible: allergic reactions like skin rash, itching or hives, swelling of the face, lips, or tongue breathing problems changes in vision fast, irregular heartbeat high or low blood pressure mouth sores pain, tingling, numbness in the hands or feet signs of decreased platelets or bleeding - bruising, pinpoint red spots on the skin, black, tarry stools, blood in the urine signs of decreased red blood cells - unusually weak or tired, feeling faint or lightheaded, falls signs of infection - fever or chills, cough, sore throat, pain or difficulty passing urine signs and symptoms of liver injury like dark yellow or brown urine; general ill feeling or flu-like symptoms; light-colored stools; loss of appetite; nausea; right upper belly pain; unusually weak or tired; yellowing of the eyes or skin swelling of the ankles, feet, hands unusually slow heartbeat Side effects that usually do not require medical attention (report to your doctor or health care professional if they continue or are bothersome): diarrhea hair loss loss of appetite muscle or joint pain nausea, vomiting pain, redness, or irritation at site where injected tiredness This list may not describe all possible side effects. Call your doctor for medical advice about side effects. You may report side effects to FDA at 1-800-FDA-1088. Where  should I keep my medication? This drug is given in a hospital or clinic and will not be stored at home. NOTE: This sheet is a summary. It may not cover all possible information. If you have questions about this medicine, talk to your doctor, pharmacist, or health care provider.  2022 Elsevier/Gold Standard (2021-06-09 00:00:00)  Carboplatin injection What is this medication? CARBOPLATIN (KAR boe pla tin) is a chemotherapy drug. It targets fast dividing cells, like cancer cells, and causes these cells to die. This medicine is used to treat ovarian cancer and many other cancers. This medicine may be used for other purposes; ask your health care provider or pharmacist if you have questions. COMMON BRAND NAME(S): Paraplatin What should I tell my care team before I take this medication? They need to know if you have any of these conditions: blood disorders hearing problems kidney disease recent or ongoing radiation therapy an unusual or allergic reaction to carboplatin, cisplatin, other chemotherapy, other medicines, foods, dyes, or preservatives pregnant or trying to get pregnant breast-feeding How should I use this medication? This drug is usually given as an infusion into a vein. It is administered in a hospital or clinic by a specially trained health care professional. Talk to your pediatrician regarding the use of this medicine in children. Special care may be  needed. Overdosage: If you think you have taken too much of this medicine contact a poison control center or emergency room at once. NOTE: This medicine is only for you. Do not share this medicine with others. What if I miss a dose? It is important not to miss a dose. Call your doctor or health care professional if you are unable to keep an appointment. What may interact with this medication? medicines for seizures medicines to increase blood counts like filgrastim, pegfilgrastim, sargramostim some antibiotics like amikacin,  gentamicin, neomycin, streptomycin, tobramycin vaccines Talk to your doctor or health care professional before taking any of these medicines: acetaminophen aspirin ibuprofen ketoprofen naproxen This list may not describe all possible interactions. Give your health care provider a list of all the medicines, herbs, non-prescription drugs, or dietary supplements you use. Also tell them if you smoke, drink alcohol, or use illegal drugs. Some items may interact with your medicine. What should I watch for while using this medication? Your condition will be monitored carefully while you are receiving this medicine. You will need important blood work done while you are taking this medicine. This drug may make you feel generally unwell. This is not uncommon, as chemotherapy can affect healthy cells as well as cancer cells. Report any side effects. Continue your course of treatment even though you feel ill unless your doctor tells you to stop. In some cases, you may be given additional medicines to help with side effects. Follow all directions for their use. Call your doctor or health care professional for advice if you get a fever, chills or sore throat, or other symptoms of a cold or flu. Do not treat yourself. This drug decreases your body's ability to fight infections. Try to avoid being around people who are sick. This medicine may increase your risk to bruise or bleed. Call your doctor or health care professional if you notice any unusual bleeding. Be careful brushing and flossing your teeth or using a toothpick because you may get an infection or bleed more easily. If you have any dental work done, tell your dentist you are receiving this medicine. Avoid taking products that contain aspirin, acetaminophen, ibuprofen, naproxen, or ketoprofen unless instructed by your doctor. These medicines may hide a fever. Do not become pregnant while taking this medicine. Women should inform their doctor if they wish  to become pregnant or think they might be pregnant. There is a potential for serious side effects to an unborn child. Talk to your health care professional or pharmacist for more information. Do not breast-feed an infant while taking this medicine. What side effects may I notice from receiving this medication? Side effects that you should report to your doctor or health care professional as soon as possible: allergic reactions like skin rash, itching or hives, swelling of the face, lips, or tongue signs of infection - fever or chills, cough, sore throat, pain or difficulty passing urine signs of decreased platelets or bleeding - bruising, pinpoint red spots on the skin, black, tarry stools, nosebleeds signs of decreased red blood cells - unusually weak or tired, fainting spells, lightheadedness breathing problems changes in hearing changes in vision chest pain high blood pressure low blood counts - This drug may decrease the number of white blood cells, red blood cells and platelets. You may be at increased risk for infections and bleeding. nausea and vomiting pain, swelling, redness or irritation at the injection site pain, tingling, numbness in the hands or feet problems with balance, talking, walking  trouble passing urine or change in the amount of urine Side effects that usually do not require medical attention (report to your doctor or health care professional if they continue or are bothersome): hair loss loss of appetite metallic taste in the mouth or changes in taste This list may not describe all possible side effects. Call your doctor for medical advice about side effects. You may report side effects to FDA at 1-800-FDA-1088. Where should I keep my medication? This drug is given in a hospital or clinic and will not be stored at home. NOTE: This sheet is a summary. It may not cover all possible information. If you have questions about this medicine, talk to your doctor, pharmacist,  or health care provider.  2022 Elsevier/Gold Standard (2008-02-28 00:00:00)

## 2021-10-20 NOTE — Progress Notes (Signed)
K 3.3 - no IV supplementation today, takes Kdur at home per Dr. Rogue Bussing

## 2021-10-20 NOTE — Progress Notes (Signed)
Pt states she gets dizzy at times when standing.

## 2021-10-20 NOTE — Assessment & Plan Note (Addendum)
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 Currently on adjuvant carbo-taxol x4 [poor candidate for cisplatin].  PD-L1-5% will benefit from immunotherapy post 4cycles of chemo.  Again reviewed the role of immunotherapy-post adjuvant chemotherapy for total of 1 year.   # Proceed with carbo-taxol cycle #4. Labs today reviewed;  acceptable for treatment today; will repeat CT scans.   # Hypokalemia: Kdur 3.3- continue Kdur BID.   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 continue-vit D 50,000.   # chronic back pain/chronic left LE/Bil PN-history of cervical spine injury-no evidence of metastatic disease.  Continue hydrocodone [for now as per Dr. Doy Hutching. STABLE  # History of diabetes- continue metformin 500 mg BID; STABLE  # Right shoulder pain- sect to [facility]- recommend tylenol prn [allergic ibuprofen]  #Social issues/relationship issues with her husband-discussed with Education officer, [employer].   # DISPOSITION: # carbo-taxol today  # follow up in 4 weeks MD; labs- cbc/cmp-; Atzeoluzimab; CT Chest;- Dr.B   Cc: Dr.A/Dr.Sparks

## 2021-10-20 NOTE — Progress Notes (Signed)
DISCONTINUE ON PATHWAY REGIMEN - Non-Small Cell Lung     A cycle is every 21 days:     Paclitaxel      Carboplatin   **Always confirm dose/schedule in your pharmacy ordering system**  REASON: Other Reason PRIOR TREATMENT: [id number]: Carboplatin AUC=6 + Paclitaxel 200 mg/m2 q21 Days x 4 Cycles TREATMENT RESPONSE: N/A - Adjuvant Therapy  START ON PATHWAY REGIMEN - Non-Small Cell Lung     A cycle is every 21 days:     Atezolizumab   **Always confirm dose/schedule in your pharmacy ordering system**  Patient Characteristics: Postoperative without Neoadjuvant Therapy (Pathologic Staging), Stage IIB, Adjuvant Chemotherapy Completed, EGFR Negative/Unknown and ALK Negative/Unknown, PD-L1 Expression ? 1% (TC) Therapeutic Status: Postoperative without Neoadjuvant Therapy (Pathologic Staging) AJCC T Category: pT3 AJCC N Category: pN0 AJCC M Category: cM0 AJCC 8 Stage Grouping: IIB ALK Rearrangement Status: Negative EGFR Mutation Status: Negative/Wild Type PD-L1 Expression Status: PD-L1 Expression ? 1% (TC) Intent of Therapy: Curative Intent, Discussed with Patient

## 2021-10-20 NOTE — Progress Notes (Signed)
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 Spiculated mass of the posterior right pulmonary apex measuring 3.2 x 2.8 cm, consistent with primary lung malignancy. 2. Enlarged right hilar and pretracheal lymph nodes measuring up to 1.6 x 1.5 cm, suspicious for nodal metastatic disease. 3. Recommend multidisciplinary thoracic referral for consideration of PET-CT metabolic characterization and tissue sampling. 4. Background of very fine centrilobular nodularity throughout the lungs, most concentrated at the apices. Scattered bronchiolar plugging. Findings are most consistent with smoking-related respiratory bronchiolitis. 5. Coarse, nodular contour of the liver in the included upper abdomen, suggestive of cirrhosis. 6. Coronary artery disease.  # COPD-  # DM- diet controlled; chronic back pain- hydrocodone 5 day; chronic left lower extremity weakness.  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 LYMPH NODE, HILAR, EXCISION:  - Lymph node negative for metastatic carcinoma.   B. LYMPH NODE, LEVEL 4, EXCISION:  - Lymph node negative for metastatic carcinoma.   C. LUNG, RIGHT UPPER LOBE, LOBECTOMY:  - Squamous cell carcinoma, 4.5 cm.  - Carcinoma extends through visceral pleura and focally involves  parietal pleura.  - Surgical margins negative for carcinoma.  - One lymph node negative for carcinoma.  - See oncology table and comment.   D. SOFT TISSUE, CHEST WALL, BIOPSY:  - Fibroadipose tissue with  focal inflammation and fibrosis.  - No carcinoma identified.   ONCOLOGY TABLE:  LUNG: Resection  Synchronous Tumors: Not applicable.  Total Number of Primary Tumors: 1  Procedure: Right upper lobectomy with lymph node biopsies and chest wall  biopsy.  Specimen Laterality: Right upper lobe.  Tumor Focality: Unifocal.  Tumor Site: Right upper lobe.  Tumor Size: 4.5 x 4 x 3 cm.  Histologic Type: Squamous cell carcinoma.  Visceral Pleura Invasion: Present.  Direct Invasion of Adjacent Structures: Focal involvement of parietal  pleura.  Lymphovascular Invasion: Not identified.  Margins: All surgical margins negative for carcinoma.  Regional Lymph Nodes:       Number of Lymph Nodes Involved: 0                            Nodal Sites with Tumor: 0       Number of Lymph Nodes Examined: 3                       Nodal Sites Examined: 2  Pathologic Stage Classification (pTNM, AJCC 8th Edition): pT3, pN0  Ancillary Studies: Can be performed if requested.    Malignant neoplasm of upper lobe of right lung ([place])  04/24/2021 Initial Diagnosis   Malignant neoplasm of upper lobe of right lung ([place])   05/02/2021 Cancer Staging   Staging form: Lung, AJCC 8th Edition - Clinical: Stage IB (cT2a, cN0, cM0) - Signed by Cammie Sickle, MD on 05/02/2021 Stage prefix: Initial diagnosis    07/23/2021 Cancer Staging   Staging form: Lung, AJCC 8th Edition - Pathologic: Stage IIB (  pT3, pN0, cM0) - Signed by Cammie Sickle, MD on 07/23/2021 Stage prefix: Initial diagnosis     HISTORY OF PRESENTING ILLNESS: Patient ambulating independently.  Accompanied by daughter.  Olivia Buchanan 71 y.o.  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  She continues to have tingling and numbness in her extremities.  Denies any significant worsening.  Continues to have right shoulder pain not any worse.  Denies any worsening shortness of  breath or cough.  Admits to being overwhelmed-given social issues at home/with regards relationship with her husband.  Review of Systems  Constitutional:  Positive for malaise/fatigue and weight loss. Negative for chills, diaphoresis and fever.  HENT:  Negative for nosebleeds and sore throat.   Eyes:  Negative for double vision.  Respiratory:  Positive for cough and shortness of breath. Negative for hemoptysis and wheezing.   Cardiovascular:  Negative for chest pain, palpitations, orthopnea and leg swelling.  Gastrointestinal:  Negative for abdominal pain, blood in stool, constipation, diarrhea, heartburn, melena and vomiting.  Genitourinary:  Negative for dysuria, frequency and urgency.  Musculoskeletal:  Positive for back pain, joint pain and neck pain.  Skin: Negative.  Negative for itching and rash.  Neurological:  Negative for tingling, focal weakness and weakness.  Endo/Heme/Allergies:  Does not bruise/bleed easily.  Psychiatric/Behavioral:  Negative for depression. The patient is not nervous/anxious and does not have insomnia.     MEDICAL HISTORY:  Past Medical History:  Diagnosis Date   Angioedema 12/08/2019   Anxiety    Aortic atherosclerosis (HCC)    Asthma    CAD (coronary artery disease)    3 vessel   Cancer (HCC)    COPD (chronic obstructive pulmonary disease) ([place]) 12/08/2019   DDD (degenerative disc disease), lumbar    Depression    Diabetes mellitus without complication (HCC)    no meds, diet controlled   Dyspnea    GERD (gastroesophageal reflux disease)    HTN (hypertension) 12/08/2019   Hyperlipidemia    Hypertension    Mass of upper lobe of right lung 04/02/2021   spiculated mass RIGHT posterior pulmonary apex with associated hilar and pretracheal LAD; measured 3.2 x 2.8 cm   Obesity (BMI 30-39.9)    T2DM (type 2 diabetes mellitus) ([place]) 12/08/2019    SURGICAL HISTORY: Past Surgical History:  Procedure Laterality Date   BACK SURGERY     lumbar L5-S1  ruptured disc x 3 surgeries   CERVICAL FUSION  2009   FINE NEEDLE ASPIRATION  06/23/2021   Procedure: FINE NEEDLE ASPIRATION (FNA) LINEAR;  Surgeon: Garner Nash, DO;  Location: [facility];  Service: Pulmonary;;   INTERCOSTAL NERVE BLOCK Right 07/09/2021   Procedure: INTERCOSTAL NERVE BLOCK;  Surgeon: Lajuana Matte, MD;  Location: [facility];  Service: Thoracic;  Laterality: Right;   IR IMAGING GUIDED PORT INSERTION  08/06/2021   LAPAROSCOPIC TOTAL HYSTERECTOMY  1998   with oophorectomy   LYMPH NODE DISSECTION Right 07/09/2021   Procedure: LYMPH NODE DISSECTION;  Surgeon: Lajuana Matte, MD;  Location: [facility];  Service: Thoracic;  Laterality: Right;   TONSILLECTOMY     VIDEO BRONCHOSCOPY WITH ENDOBRONCHIAL NAVIGATION N/A 04/15/2021   Procedure: VIDEO BRONCHOSCOPY WITH ENDOBRONCHIAL NAVIGATION;  Surgeon: Ottie Glazier, MD;  Location: [facility];  Service: Thoracic;  Laterality: N/A;   VIDEO BRONCHOSCOPY WITH ENDOBRONCHIAL ULTRASOUND N/A 04/15/2021   Procedure: VIDEO BRONCHOSCOPY WITH ENDOBRONCHIAL ULTRASOUND;  Surgeon: Ottie Glazier, MD;  Location:  [facility];  Service: Thoracic;  Laterality: N/A;   VIDEO BRONCHOSCOPY WITH ENDOBRONCHIAL ULTRASOUND N/A 06/23/2021   Procedure: VIDEO BRONCHOSCOPY WITH ENDOBRONCHIAL ULTRASOUND;  Surgeon: Garner Nash, DO;  Location: [facility];  Service: Pulmonary;  Laterality: N/A;    SOCIAL HISTORY: Social History   Socioeconomic History   Marital status: Married    Spouse name: Olivia Buchanan   Number of children: 3   Years of education: Not on file   Highest education level: Not on file  Occupational History   Not on file  Tobacco Use   Smoking status: Former    Packs/day: 3.00    Years: 50.00    Pack years: 150.00    Types: Cigarettes    Quit date: 2019    Years since quitting: 4.0   Smokeless tobacco: Never  Vaping Use   Vaping Use: Never used  Substance and Sexual Activity   Alcohol use: Not Currently   Drug use: Never   Sexual  activity: Not on file    Comment: Hysterectomy  Other Topics Concern   Not on file  Social History Narrative   Lives in home; with husband; lives in [place]; never smoking 2019; no alcohol. 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    Social Determinants of Health   Financial Resource Strain: Not on file  Food Insecurity: Not on file  Transportation Needs: Not on file  Physical Activity: Not on file  Stress: Not on file  Social Connections: Not on file  Intimate Partner Violence: Not on file    FAMILY HISTORY: Family History  Problem Relation Age of Onset   Bone cancer Maternal Uncle     ALLERGIES:  is allergic to ibuprofen and macrobid [nitrofurantoin].  MEDICATIONS:  Current Outpatient Medications  Medication Sig Dispense Refill   bisoprolol-hydrochlorothiazide (ZIAC) 5-6.25 MG tablet Take 1 tablet by mouth daily.     clonazePAM (KLONOPIN) 0.5 MG tablet Take 0.5 mg by mouth in the morning, at noon, and at bedtime.     dexamethasone (DECADRON) 4 MG tablet Take 2 tablets (8 mg total) by mouth daily. Start the day after carboplatin chemotherapy for 3 days. 30 tablet 1   docusate sodium (COLACE) 100 MG capsule Take 200 mg by mouth at bedtime.     EPINEPHrine 0.3 mg/0.3 mL IJ SOAJ injection Inject 0.3 mLs (0.3 mg total) into the muscle as needed for anaphylaxis. 2 each 0   ergocalciferol (VITAMIN D2) 1.25 MG (50000 UT) capsule Take 1 capsule (50,000 Units total) by mouth once a week. 12 capsule 1   fluticasone-salmeterol (ADVAIR) 250-50 MCG/ACT AEPB Inhale 1 puff into the lungs in the morning and at bedtime.     gabapentin (NEURONTIN) 300 MG capsule Take 300 mg by mouth 3 (three) times daily. Take 300 mg qam and lunch and take 600 mg qhs     hydrochlorothiazide (HYDRODIURIL) 25 MG tablet Take 25 mg by mouth daily.     ipratropium-albuterol (DUONEB) 0.5-2.5 (3) MG/3ML SOLN Take 3 mLs by nebulization 3 (three) times daily.     lidocaine-prilocaine (EMLA) cream  Apply to affected area once 30 g 3   metFORMIN (GLUCOPHAGE) 500 MG tablet Take 1 tablet (500 mg total) by mouth 2 (two) times daily with a meal. 60 tablet 3   nystatin cream (MYCOSTATIN) Apply 1 application topically 2 (two) times daily as needed for dry skin (around lips).     pantoprazole (PROTONIX) 40 MG tablet Take 40 mg by mouth at bedtime.  potassium chloride SA (KLOR-CON) 20 MEQ tablet Take 20 mEq by mouth 2 (two) times daily.     pravastatin (PRAVACHOL) 80 MG tablet Take 80 mg by mouth at bedtime.     prochlorperazine (COMPAZINE) 10 MG tablet Take 1 tablet (10 mg total) by mouth every 6 (six) hours as needed (Nausea or vomiting). 30 tablet 1   tiZANidine (ZANAFLEX) 4 MG tablet Take 4 mg by mouth 3 (three) times daily.     venlafaxine XR (EFFEXOR-XR) 75 MG 24 hr capsule Take 75 mg by mouth at bedtime.     albuterol (VENTOLIN HFA) 108 (90 Base) MCG/ACT inhaler Inhale 1 puff into the lungs every 6 (six) hours as needed for wheezing. (Patient not taking: Reported on 09/03/2021)     Ascorbic Acid (VITAMIN C) 1000 MG tablet Take 1,000 mg by mouth in the morning and at bedtime. (Patient not taking: Reported on 08/13/2021)     aspirin EC 81 MG tablet Take 81 mg by mouth at bedtime. (Patient not taking: Reported on 10/20/2021)     cetirizine-pseudoephedrine (ZYRTEC-D) 5-120 MG tablet Take 1 tablet by mouth at bedtime. (Patient not taking: Reported on 08/13/2021)     Melatonin 10 MG TABS Take 10 mg by mouth at bedtime. (Patient not taking: Reported on 08/21/2021)     Multiple Vitamin (MULTI-VITAMIN) tablet Take 1 tablet by mouth at bedtime. (Patient not taking: Reported on 08/13/2021)     No current facility-administered medications for this visit.   Facility-Administered Medications Ordered in Other Visits  Medication Dose Route Frequency Provider Last Rate Last Admin   CARBOplatin (PARAPLATIN) 590 mg in sodium chloride 0.9 % 250 mL chemo infusion  590 mg Intravenous Once Charlaine Dalton R,  MD       heparin lock flush 100 UNIT/ML injection            heparin lock flush 100 unit/mL  500 Units Intracatheter Once PRN Cammie Sickle, MD       PACLitaxel (TAXOL) 378 mg in sodium chloride 0.9 % 500 mL chemo infusion (> 34m/m2)  200 mg/m2 (Treatment Plan Recorded) Intravenous Once BCammie Sickle MD 188 mL/hr at 10/20/21 1105 378 mg at 10/20/21 1105   sodium chloride flush (NS) 0.9 % injection 10 mL  10 mL Intracatheter PRN BCammie Sickle MD          .  PHYSICAL EXAMINATION: ECOG PERFORMANCE STATUS: 1 - Symptomatic but completely ambulatory  Vitals:   10/20/21 0845  BP: 103/67  Pulse: 75  Temp: 98.1 F (36.7 C)  SpO2: 98%   Filed Weights   10/20/21 0845  Weight: 182 lb 3.2 oz (82.6 kg)    Physical Exam Vitals and nursing note reviewed.  HENT:     Head: Normocephalic and atraumatic.     Mouth/Throat:     Mouth: Mucous membranes are moist.     Pharynx: Oropharynx is clear. No oropharyngeal exudate.  Eyes:     Extraocular Movements: Extraocular movements intact.     Pupils: Pupils are equal, round, and reactive to light.  Cardiovascular:     Rate and Rhythm: Normal rate and regular rhythm.  Pulmonary:     Effort: No respiratory distress.     Breath sounds: No wheezing.     Comments: Decreased breath sounds bilaterally at bases.  No wheeze or crackles Abdominal:     General: Bowel sounds are normal. There is no distension.     Palpations: Abdomen is soft. There is no mass.  Tenderness: There is no abdominal tenderness. There is no guarding or rebound.  Musculoskeletal:        General: No tenderness. Normal range of motion.     Cervical back: Normal range of motion and neck supple.  Skin:    General: Skin is warm.  Neurological:     General: No focal deficit present.     Mental Status: She is alert and oriented to person, place, and time.     Comments: 4-5 strength in the left lower extremity/chronic.  Psychiatric:        Mood and  Affect: Affect normal.        Behavior: Behavior normal.        Judgment: Judgment normal.     LABORATORY DATA:  I have reviewed the data as listed Lab Results  Component Value Date   WBC 8.3 10/20/2021   HGB 11.2 (L) 10/20/2021   HCT 32.6 (L) 10/20/2021   MCV 91.6 10/20/2021   PLT 145 (L) 10/20/2021   Recent Labs    09/03/21 0806 09/29/21 0814 10/20/21 0818  NA 139 134* 136  K 3.8 3.8 3.3*  CL 101 96* 97*  CO2 26 27 28   GLUCOSE 173* 206* 167*  BUN 10 11 9   CREATININE 0.58 0.66 0.66  CALCIUM 9.1 8.7* 8.9  GFRNONAA >60 >60 >60  PROT 7.5 7.3 7.6  ALBUMIN 3.7 3.6 4.0  AST 33 38 38  ALT 20 19 22   ALKPHOS 77 57 58  BILITOT 0.4 0.5 0.6    RADIOGRAPHIC STUDIES: I have personally reviewed the radiological images as listed and agreed with the findings in the report. No results found.  ASSESSMENT & PLAN:   Malignant neoplasm of upper lobe of right lung ([place]) # pT3N0 [4.5cm; Squamous cell ca- + involvement of the parietal pleura]; stage II. Currently on adjuvant carbo-taxol x4 [poor candidate for cisplatin].  PD-L1-5% will benefit from immunotherapy post 4cycles of chemo.  Again reviewed the role of immunotherapy-post adjuvant chemotherapy for total of 1 year.   # Proceed with carbo-taxol cycle #4. Labs today reviewed;  acceptable for treatment today; will repeat CT scans.   # Hypokalemia: Kdur 3.3- continue Kdur BID.   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 continue-vit D 50,000.   # chronic back pain/chronic left LE/Bil PN-history of cervical spine injury-no evidence of metastatic disease.  Continue hydrocodone [for now as per Dr. Doy Hutching. STABLE  # History of diabetes- continue metformin 500 mg BID; STABLE  # Right shoulder pain- sect to [facility]- recommend tylenol prn [allergic ibuprofen]  #Social issues/relationship issues with her husband-discussed with Education officer, museum.   # DISPOSITION: #  carbo-taxol today  # follow up in 4 weeks MD; labs- cbc/cmp-; Atzeoluzimab; CT Chest;- Dr.B   Cc: Dr.A/Dr.Sparks       All questions were answered. The patient knows to call the clinic with any problems, questions or concerns.       Cammie Sickle, MD 10/20/2021 11:15 AM

## 2021-10-30 DIAGNOSIS — E118 Type 2 diabetes mellitus with unspecified complications: Secondary | ICD-10-CM | POA: Diagnosis not present

## 2021-10-30 DIAGNOSIS — I10 Essential (primary) hypertension: Secondary | ICD-10-CM | POA: Diagnosis not present

## 2021-10-30 DIAGNOSIS — E782 Mixed hyperlipidemia: Secondary | ICD-10-CM | POA: Diagnosis not present

## 2021-10-30 DIAGNOSIS — C3411 Malignant neoplasm of upper lobe, right bronchus or lung: Secondary | ICD-10-CM | POA: Diagnosis not present

## 2021-11-09 NOTE — Progress Notes (Signed)
Pharmacist Chemotherapy Monitoring - Initial Assessment    Anticipated start date: 11/17/21   The following has been reviewed per standard work regarding the patient's treatment regimen: The patient's diagnosis, treatment plan and drug doses, and organ/hematologic function Lab orders and baseline tests specific to treatment regimen  The treatment plan start date, drug sequencing, and pre-medications Prior authorization status  Patient's documented medication list, including drug-drug interaction screen and prescriptions for anti-emetics and supportive care specific to the treatment regimen The drug concentrations, fluid compatibility, administration routes, and timing of the medications to be used The patient's access for treatment and lifetime cumulative dose history, if applicable  The patient's medication allergies and previous infusion related reactions, if applicable   Changes made to treatment plan:  N/A  Follow up needed:  Pending authorization for treatment  and signing treatment plan   Adelina Mings, [place], 11/09/2021  1:35 PM

## 2021-11-13 ENCOUNTER — Ambulatory Visit: Payer: Medicare HMO

## 2021-11-17 ENCOUNTER — Inpatient Hospital Stay: Payer: Medicare HMO

## 2021-11-17 ENCOUNTER — Inpatient Hospital Stay: Payer: Medicare HMO | Admitting: Internal Medicine

## 2021-11-25 NOTE — Progress Notes (Signed)
Pharmacist Chemotherapy Monitoring - Initial Assessment   ? ?Anticipated start date: 12/02/21  ? ?The following has been reviewed per standard work regarding the patient's treatment regimen: ?The patient's diagnosis, treatment plan and drug doses, and organ/hematologic function ?Lab orders and baseline tests specific to treatment regimen  ?The treatment plan start date, drug sequencing, and pre-medications ?Prior authorization status  ?Patient's documented medication list, including drug-drug interaction screen and prescriptions for anti-emetics and supportive care specific to the treatment regimen ?The drug concentrations, fluid compatibility, administration routes, and timing of the medications to be used ?The patient's access for treatment and lifetime cumulative dose history, if applicable  ?The patient's medication allergies and previous infusion related reactions, if applicable  ? ?Changes made to treatment plan:  ?treatment plan date ? ?Follow up needed:  ?signing treatment plan ? ? ?Adelina Mings, [facility], ?11/25/2021  1:03 PM  ?

## 2021-11-30 ENCOUNTER — Inpatient Hospital Stay: Payer: Medicare HMO

## 2021-11-30 ENCOUNTER — Ambulatory Visit: Admission: RE | Admit: 2021-11-30 | Payer: Medicare HMO | Source: Ambulatory Visit

## 2021-12-02 ENCOUNTER — Other Ambulatory Visit: Payer: Medicare HMO

## 2021-12-02 ENCOUNTER — Inpatient Hospital Stay: Payer: Medicare HMO | Admitting: Internal Medicine

## 2021-12-02 ENCOUNTER — Telehealth: Payer: Self-pay | Admitting: *Deleted

## 2021-12-02 ENCOUNTER — Inpatient Hospital Stay: Payer: Medicare HMO

## 2021-12-02 NOTE — Telephone Encounter (Signed)
Yes I know havent had chance to reschedule it yet only me today ?

## 2021-12-02 NOTE — Telephone Encounter (Signed)
Patient needs to reschedule CT scan. ?

## 2021-12-03 ENCOUNTER — Telehealth: Payer: Self-pay | Admitting: *Deleted

## 2021-12-03 ENCOUNTER — Encounter: Payer: Self-pay | Admitting: Internal Medicine

## 2021-12-03 ENCOUNTER — Telehealth: Payer: Self-pay

## 2021-12-03 NOTE — Telephone Encounter (Signed)
Port can remain access for the CT on the next day.  She will need to come back to the [facility] after CT to have port de-accessed.  Will discuss with pt at her appt. ?

## 2021-12-03 NOTE — Telephone Encounter (Signed)
Noted  

## 2021-12-03 NOTE — Telephone Encounter (Signed)
Called to schedule the CT pt cancelled when sick we cant get it done till after her appt and she comes to see Dr. Jacinto Reap. ?

## 2021-12-03 NOTE — Telephone Encounter (Signed)
Message received from scheduling, Co[place]:  CT can't be scheduled till day after treatment. Pt will get appt at check out. ?

## 2021-12-03 NOTE — Telephone Encounter (Signed)
If she come after CT on 3/15 she can just leave it accessed for the procedure I feel that would be better. Just my opinion please advise ?

## 2021-12-03 NOTE — Telephone Encounter (Signed)
Patient called asking if she needs an appointment for her port to be accessed before her CT 3/15 as she wants them to use her port for the contrast. Of note, she has an appointment 3/14 for treatment so her port will be accessed then and could be left accessed for the next day use. Please advise ?

## 2021-12-08 NOTE — Progress Notes (Signed)
Pharmacist Chemotherapy Monitoring - Initial Assessment   ? ?Anticipated start date: 12/15/21  ? ?The following has been reviewed per standard work regarding the patient's treatment regimen: ?The patient's diagnosis, treatment plan and drug doses, and organ/hematologic function ?Lab orders and baseline tests specific to treatment regimen  ?The treatment plan start date, drug sequencing, and pre-medications ?Prior authorization status  ?Patient's documented medication list, including drug-drug interaction screen and prescriptions for anti-emetics and supportive care specific to the treatment regimen ?The drug concentrations, fluid compatibility, administration routes, and timing of the medications to be used ?The patient's access for treatment and lifetime cumulative dose history, if applicable  ?The patient's medication allergies and previous infusion related reactions, if applicable  ? ?Changes made to treatment plan:  ?treatment plan date ? ?Follow up needed:  ?adding baseline TSH for   and signing treatment plan ? ? ?Adelina Mings, [facility], ?12/08/2021  11:26 AM  ?

## 2021-12-10 DIAGNOSIS — J449 Chronic obstructive pulmonary disease, unspecified: Secondary | ICD-10-CM | POA: Diagnosis not present

## 2021-12-10 DIAGNOSIS — Z79899 Other long term (current) drug therapy: Secondary | ICD-10-CM | POA: Diagnosis not present

## 2021-12-10 DIAGNOSIS — E785 Hyperlipidemia, unspecified: Secondary | ICD-10-CM | POA: Diagnosis not present

## 2021-12-10 DIAGNOSIS — Z1211 Encounter for screening for malignant neoplasm of colon: Secondary | ICD-10-CM | POA: Diagnosis not present

## 2021-12-10 DIAGNOSIS — C3490 Malignant neoplasm of unspecified part of unspecified bronchus or lung: Secondary | ICD-10-CM | POA: Diagnosis not present

## 2021-12-10 DIAGNOSIS — Z0000 Encounter for general adult medical examination without abnormal findings: Secondary | ICD-10-CM | POA: Diagnosis not present

## 2021-12-10 DIAGNOSIS — E119 Type 2 diabetes mellitus without complications: Secondary | ICD-10-CM | POA: Diagnosis not present

## 2021-12-10 DIAGNOSIS — Z87891 Personal history of nicotine dependence: Secondary | ICD-10-CM | POA: Diagnosis not present

## 2021-12-10 DIAGNOSIS — I10 Essential (primary) hypertension: Secondary | ICD-10-CM | POA: Diagnosis not present

## 2021-12-15 ENCOUNTER — Inpatient Hospital Stay (HOSPITAL_BASED_OUTPATIENT_CLINIC_OR_DEPARTMENT_OTHER): Payer: Medicare HMO | Admitting: Internal Medicine

## 2021-12-15 ENCOUNTER — Encounter: Payer: Self-pay | Admitting: Internal Medicine

## 2021-12-15 ENCOUNTER — Other Ambulatory Visit: Payer: Self-pay

## 2021-12-15 ENCOUNTER — Inpatient Hospital Stay: Payer: Medicare HMO

## 2021-12-15 ENCOUNTER — Inpatient Hospital Stay: Payer: Medicare HMO | Attending: Internal Medicine

## 2021-12-15 DIAGNOSIS — E876 Hypokalemia: Secondary | ICD-10-CM | POA: Insufficient documentation

## 2021-12-15 DIAGNOSIS — M549 Dorsalgia, unspecified: Secondary | ICD-10-CM | POA: Diagnosis not present

## 2021-12-15 DIAGNOSIS — C3411 Malignant neoplasm of upper lobe, right bronchus or lung: Secondary | ICD-10-CM

## 2021-12-15 DIAGNOSIS — Z79899 Other long term (current) drug therapy: Secondary | ICD-10-CM | POA: Insufficient documentation

## 2021-12-15 DIAGNOSIS — Z452 Encounter for adjustment and management of vascular access device: Secondary | ICD-10-CM | POA: Insufficient documentation

## 2021-12-15 DIAGNOSIS — Z95828 Presence of other vascular implants and grafts: Secondary | ICD-10-CM

## 2021-12-15 DIAGNOSIS — G8929 Other chronic pain: Secondary | ICD-10-CM | POA: Diagnosis not present

## 2021-12-15 LAB — URINALYSIS, COMPLETE (UACMP) WITH MICROSCOPIC
Bacteria, UA: NONE SEEN
Bilirubin Urine: NEGATIVE
Glucose, UA: NEGATIVE mg/dL
Hgb urine dipstick: NEGATIVE
Ketones, ur: NEGATIVE mg/dL
Nitrite: NEGATIVE
Protein, ur: NEGATIVE mg/dL
Specific Gravity, Urine: 1.012 (ref 1.005–1.030)
pH: 5 (ref 5.0–8.0)

## 2021-12-15 LAB — CBC WITH DIFFERENTIAL/PLATELET
Abs Immature Granulocytes: 0.03 10*3/uL (ref 0.00–0.07)
Basophils Absolute: 0.1 10*3/uL (ref 0.0–0.1)
Basophils Relative: 1 %
Eosinophils Absolute: 1.1 10*3/uL — ABNORMAL HIGH (ref 0.0–0.5)
Eosinophils Relative: 11 %
HCT: 35 % — ABNORMAL LOW (ref 36.0–46.0)
Hemoglobin: 11.3 g/dL — ABNORMAL LOW (ref 12.0–15.0)
Immature Granulocytes: 0 %
Lymphocytes Relative: 29 %
Lymphs Abs: 3 10*3/uL (ref 0.7–4.0)
MCH: 32.3 pg (ref 26.0–34.0)
MCHC: 32.3 g/dL (ref 30.0–36.0)
MCV: 100 fL (ref 80.0–100.0)
Monocytes Absolute: 0.8 10*3/uL (ref 0.1–1.0)
Monocytes Relative: 8 %
Neutro Abs: 5.3 10*3/uL (ref 1.7–7.7)
Neutrophils Relative %: 51 %
Platelets: 167 10*3/uL (ref 150–400)
RBC: 3.5 MIL/uL — ABNORMAL LOW (ref 3.87–5.11)
RDW: 14.1 % (ref 11.5–15.5)
WBC: 10.4 10*3/uL (ref 4.0–10.5)
nRBC: 0 % (ref 0.0–0.2)

## 2021-12-15 LAB — COMPREHENSIVE METABOLIC PANEL
ALT: 20 U/L (ref 0–44)
AST: 42 U/L — ABNORMAL HIGH (ref 15–41)
Albumin: 3.7 g/dL (ref 3.5–5.0)
Alkaline Phosphatase: 55 U/L (ref 38–126)
Anion gap: 9 (ref 5–15)
BUN: 10 mg/dL (ref 8–23)
CO2: 25 mmol/L (ref 22–32)
Calcium: 9 mg/dL (ref 8.9–10.3)
Chloride: 103 mmol/L (ref 98–111)
Creatinine, Ser: 0.58 mg/dL (ref 0.44–1.00)
GFR, Estimated: >60 mL/min (ref >60)
Glucose, Bld: 127 mg/dL — ABNORMAL HIGH (ref 70–99)
Potassium: 3.9 mmol/L (ref 3.5–5.1)
Sodium: 137 mmol/L (ref 135–145)
Total Bilirubin: 0.6 mg/dL (ref 0.3–1.2)
Total Protein: 7.4 g/dL (ref 6.5–8.1)

## 2021-12-15 LAB — TSH: TSH: 1.669 u[IU]/mL (ref 0.350–4.500)

## 2021-12-15 LAB — LIPID PANEL
Cholesterol: 152 mg/dL (ref 0–200)
HDL: 34 mg/dL — ABNORMAL LOW (ref >40)
LDL Cholesterol: 82 mg/dL (ref 0–99)
Total CHOL/HDL Ratio: 4.5 RATIO
Triglycerides: 182 mg/dL — ABNORMAL HIGH (ref <150)
VLDL: 36 mg/dL (ref 0–40)

## 2021-12-15 MED ORDER — SODIUM CHLORIDE 0.9% FLUSH
10.0000 mL | Freq: Once | INTRAVENOUS | Status: AC
  Administered 2021-12-15: 10 mL via INTRAVENOUS
  Filled 2021-12-15: qty 10

## 2021-12-15 MED ORDER — HEPARIN SOD (PORK) LOCK FLUSH 100 UNIT/ML IV SOLN
500.0000 [IU] | Freq: Once | INTRAVENOUS | Status: AC
  Administered 2021-12-15: 500 [IU] via INTRAVENOUS
  Filled 2021-12-15: qty 5

## 2021-12-15 NOTE — Progress Notes (Signed)
Olivia Buchanan ?CONSULT NOTE ? ?Patient Care Team: ?Olivia Crouch, MD as PCP - General (Internal Medicine) ?Olivia Nab, RN as Sales executive ? ?CHIEF COMPLAINTS/PURPOSE OF CONSULTATION: lung cancer ? 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 Spiculated mass of the posterior right pulmonary apex measuring ?3.2 x 2.8 cm, consistent with primary lung malignancy. ?2. Enlarged right hilar and pretracheal lymph nodes measuring up to ?1.6 x 1.5 cm, suspicious for nodal metastatic disease. ?3. Recommend multidisciplinary thoracic referral for consideration ?of PET-CT metabolic characterization and tissue sampling. ?4. Background of very fine centrilobular nodularity throughout the ?lungs, most concentrated at the apices. Scattered bronchiolar ?plugging. Findings are most consistent with smoking-related ?respiratory bronchiolitis. ?5. Coarse, nodular contour of the liver in the included upper ?abdomen, suggestive of cirrhosis. ?6. Coronary artery disease. ? ?# COPD- ? ?# DM- diet controlled; chronic back pain- hydrocodone 5 day; chronic left lower extremity weakness.  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 ?# NGS/MOLECULAR TESTS: ? ? ? ?# PALLIATIVE CARE EVALUATION: ? ?# PAIN MANAGEMENT:  ? ? ?DIAGNOSIS:  ? ?STAGE:         ;  GOALS: ? ?CURRENT/MOST RECENT THERAPY :   ? ?A. LYMPH NODE, HILAR, EXCISION:  ?- Lymph node negative for metastatic carcinoma.  ? ?B. LYMPH NODE, LEVEL 4, EXCISION:  ?- Lymph node negative for metastatic carcinoma.  ? ?C. LUNG, RIGHT UPPER LOBE, LOBECTOMY:  ?- Squamous cell carcinoma, 4.5 cm.  ?- Carcinoma extends through visceral pleura and focally involves  ?parietal pleura.  ?- Surgical margins negative for carcinoma.  ?- One lymph node negative for carcinoma.  ?- See oncology table and comment.  ? ?D. SOFT TISSUE, CHEST WALL, BIOPSY:  ?- Fibroadipose tissue with  focal inflammation and fibrosis.  ?- No carcinoma identified.  ? ?ONCOLOGY TABLE:  ?LUNG: Resection  ?Synchronous Tumors: Not applicable.  ?Total Number of Primary Tumors: 1  ?Procedure: Right upper lobectomy with lymph node biopsies and chest wall  ?biopsy.  ?Specimen Laterality: Right upper lobe.  ?Tumor Focality: Unifocal.  ?Tumor Site: Right upper lobe.  ?Tumor Size: 4.5 x 4 x 3 cm.  ?Histologic Type: Squamous cell carcinoma.  ?Visceral Pleura Invasion: Present.  ?Direct Invasion of Adjacent Structures: Focal involvement of parietal  ?pleura.  ?Lymphovascular Invasion: Not identified.  ?Margins: All surgical margins negative for carcinoma.  ?Regional Lymph Nodes:  ?     Number of Lymph Nodes Involved: 0  ?                          Nodal Sites with Tumor: 0  ?     Number of Lymph Nodes Examined: 3  ?                     Nodal Sites Examined: 2  ?Pathologic Stage Classification (pTNM, AJCC 8th Edition): pT3, pN0  ?Ancillary Studies: Can be performed if requested.  ?  ?Malignant neoplasm of upper lobe of right lung ([place])  ?04/24/2021 Initial Diagnosis  ? Malignant neoplasm of upper lobe of right lung ([place]) ?  ?05/02/2021 Cancer Staging  ? Staging form: Lung, AJCC 8th Edition ?- Clinical: Stage IB (cT2a, cN0, cM0) - Signed by Cammie Sickle, MD on 05/02/2021 ?Stage prefix: Initial diagnosis ? ?  ?07/23/2021 Cancer Staging  ? Staging form: Lung, AJCC 8th Edition ?- Pathologic: Stage IIB (  pT3, pN0, cM0) - Signed by Cammie Sickle, MD on 07/23/2021 ?Stage prefix: Initial diagnosis ? ?  ? ?HISTORY OF PRESENTING ILLNESS: Patient ambulating independently.  Accompanied by daughter, Olivia Buchanan. ? ?Olivia Buchanan 71 y.o.  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 ? ?[place] patient diagnosed- COVID.  Patient did not have to be hospitalized.  S/p Paxlovid. ? ?After the last round of chemotherapy cycle #4 of CarboTaxol patient noted to have  worsening tingling and numbness.  ? ?Continues to have chronic back pain.  No headaches.  No chest pain. ? ? ?Review of Systems  ?Constitutional:  Positive for malaise/fatigue and weight loss. Negative for chills, diaphoresis and fever.  ?HENT:  Negative for nosebleeds and sore throat.   ?Eyes:  Negative for double vision.  ?Respiratory:  Positive for cough and shortness of breath. Negative for hemoptysis and wheezing.   ?Cardiovascular:  Negative for chest pain, palpitations, orthopnea and leg swelling.  ?Gastrointestinal:  Negative for abdominal pain, blood in stool, constipation, diarrhea, heartburn, melena and vomiting.  ?Genitourinary:  Negative for dysuria, frequency and urgency.  ?Musculoskeletal:  Positive for back pain, joint pain and neck pain.  ?Skin: Negative.  Negative for itching and rash.  ?Neurological:  Negative for tingling, focal weakness and weakness.  ?Endo/Heme/Allergies:  Does not bruise/bleed easily.  ?Psychiatric/Behavioral:  Negative for depression. The patient is not nervous/anxious and does not have insomnia.    ? ?MEDICAL HISTORY:  ?Past Medical History:  ?Diagnosis Date  ? Angioedema 12/08/2019  ? Anxiety   ? Aortic atherosclerosis ([place])   ? Asthma   ? CAD (coronary artery disease)   ? 3 vessel  ? Cancer [facility])   ? COPD (chronic obstructive pulmonary disease) ([place]) 12/08/2019  ? DDD (degenerative disc disease), lumbar   ? Depression   ? Diabetes mellitus without complication ([place])   ? no meds, diet controlled  ? Dyspnea   ? GERD (gastroesophageal reflux disease)   ? HTN (hypertension) 12/08/2019  ? Hyperlipidemia   ? Hypertension   ? Mass of upper lobe of right lung 04/02/2021  ? spiculated mass RIGHT posterior pulmonary apex with associated hilar and pretracheal LAD; measured 3.2 x 2.8 cm  ? Obesity (BMI 30-39.9)   ? T2DM (type 2 diabetes mellitus) ([place]) 12/08/2019  ? ? ?SURGICAL HISTORY: ?Past Surgical History:  ?Procedure Laterality Date  ? BACK SURGERY    ? lumbar L5-S1 ruptured disc  x 3 surgeries  ? CERVICAL FUSION  2009  ? FINE NEEDLE ASPIRATION  06/23/2021  ? Procedure: FINE NEEDLE ASPIRATION (FNA) LINEAR;  Surgeon: Garner Nash, DO;  Location: [facility];  Service: Pulmonary;;  ? INTERCOSTAL NERVE BLOCK Right 07/09/2021  ? Procedure: INTERCOSTAL NERVE BLOCK;  Surgeon: Lajuana Matte, MD;  Location: [place];  Service: Thoracic;  Laterality: Right;  ? IR IMAGING GUIDED PORT INSERTION  08/06/2021  ? LAPAROSCOPIC TOTAL HYSTERECTOMY  1998  ? with oophorectomy  ? LYMPH NODE DISSECTION Right 07/09/2021  ? Procedure: LYMPH NODE DISSECTION;  Surgeon: Lajuana Matte, MD;  Location: [place];  Service: Thoracic;  Laterality: Right;  ? TONSILLECTOMY    ? VIDEO BRONCHOSCOPY WITH ENDOBRONCHIAL NAVIGATION N/A 04/15/2021  ? Procedure: VIDEO BRONCHOSCOPY WITH ENDOBRONCHIAL NAVIGATION;  Surgeon: Ottie Glazier, MD;  Location: [facility];  Service: Thoracic;  Laterality: N/A;  ? VIDEO BRONCHOSCOPY WITH ENDOBRONCHIAL ULTRASOUND N/A 04/15/2021  ? Procedure: VIDEO BRONCHOSCOPY WITH ENDOBRONCHIAL ULTRASOUND;  Surgeon: Ottie Glazier,  MD;  Location: [facility];  Service: Thoracic;  Laterality: N/A;  ? VIDEO BRONCHOSCOPY WITH ENDOBRONCHIAL ULTRASOUND N/A 06/23/2021  ? Procedure: VIDEO BRONCHOSCOPY WITH ENDOBRONCHIAL ULTRASOUND;  Surgeon: Garner Nash, DO;  Location: [place];  Service: Pulmonary;  Laterality: N/A;  ? ? ?SOCIAL HISTORY: ?Social History  ? ?Socioeconomic History  ? Marital status: Married  ?  Spouse name: Orpah Greek  ? Number of children: 3  ? Years of education: Not on file  ? Highest education level: Not on file  ?Occupational History  ? Not on file  ?Tobacco Use  ? Smoking status: Former  ?  Packs/day: 3.00  ?  Years: 50.00  ?  Pack years: 150.00  ?  Types: Cigarettes  ?  Quit date: 2019  ?  Years since quitting: 4.2  ? Smokeless tobacco: Never  ?Vaping Use  ? Vaping Use: Never used  ?Substance and Sexual Activity  ? Alcohol use: Not Currently  ? Drug use: Never  ? Sexual activity: Not on  file  ?  Comment: Hysterectomy  ?Other Topics Concern  ? Not on file  ?Social History Narrative  ? Lives in home; with husband; lives in [place]; never smoking 2019; no alcohol. 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

## 2021-12-15 NOTE — Progress Notes (Signed)
Pt's port left accessed for CT scan scheduled tomorrow, 12/16/21.  ?

## 2021-12-15 NOTE — Assessment & Plan Note (Addendum)
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 Currently s/p adjuvant carbo-taxol x4 [poor candidate for cisplatin].  PD-L1-5% will benefit from immunotherapy post 4cycles of chemo.  Recommend immunotherapy-post adjuvant chemotherapy for total of 1 year.  ? ?#Patient currently s/p carbo-taxol cycle #4-currently borderline performance status/recent COVID side effects of chemotherapy. Awaiting on CT scans chest on 3/15. HOLD Atezolizumab for 1 month; re-start next visit.   ? ?# Hypokalemia: Kdur 3.9- STABLE- continue Kdur BID.  ? 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 continue-vit D 50,000.  ? ?# chronic back pain/chronic left LE/Bil PN-history of cervical spine injury-no evidence of metastatic disease.  Continue hydrocodone [for now as per Dr. Doy Hutching. STABLE ? ?# History of diabetes- continue metformin 500 mg BID; STABLE; await Hb A1c-  ? ?# Right shoulder pain- sect to MSK- recommend tylenol prn [allergic ibuprofen] ? ?#Social issues/relationship issues with her husband-discussed with Education officer, museum.  ? ?#We will plan to get the blood work as recommended by PCP. ? ?Call with CT ?# DISPOSITION: ?# No chemo today  ?# de-access-the port after CT scan tomorrow ?# follow up in 4 weeks MD; labs- cbc/cmp-; Atzeoluzimab- Dr.B  ? ?Cc: Dr.A/Dr.Sparks ? ? ?

## 2021-12-15 NOTE — Progress Notes (Signed)
Pt wants to know if she can now get new eye glassed since not taking chemo? ? ?Dr. Doy Hutching would like these labs drawn: ?Cbc ?Lipd w/calc LDL ?Cmp ?Thyroid ?A1C ?UA w/micro ? ?Pt would like to speak with a counselor/support person about home situation. ? ? ? ? ? ? ?

## 2021-12-16 ENCOUNTER — Inpatient Hospital Stay: Payer: Medicare HMO

## 2021-12-16 ENCOUNTER — Ambulatory Visit
Admission: RE | Admit: 2021-12-16 | Discharge: 2021-12-16 | Disposition: A | Discharge status: Home / Self Care | Payer: Medicare HMO | Source: Ambulatory Visit | Attending: Internal Medicine | Admitting: Internal Medicine

## 2021-12-16 DIAGNOSIS — C3411 Malignant neoplasm of upper lobe, right bronchus or lung: Secondary | ICD-10-CM | POA: Insufficient documentation

## 2021-12-16 DIAGNOSIS — C3490 Malignant neoplasm of unspecified part of unspecified bronchus or lung: Secondary | ICD-10-CM | POA: Diagnosis not present

## 2021-12-16 DIAGNOSIS — J439 Emphysema, unspecified: Secondary | ICD-10-CM | POA: Diagnosis not present

## 2021-12-16 DIAGNOSIS — I700 Atherosclerosis of aorta: Secondary | ICD-10-CM | POA: Diagnosis not present

## 2021-12-16 MED ORDER — IOHEXOL 300 MG/ML  SOLN
75.0000 mL | Freq: Once | INTRAMUSCULAR | Status: AC | PRN
  Administered 2021-12-16: 75 mL via INTRAVENOUS

## 2021-12-17 LAB — HEMOGLOBIN A1C
Hgb A1c MFr Bld: 5.9 % — ABNORMAL HIGH (ref 4.8–5.6)
Mean Plasma Glucose: 123 mg/dL

## 2021-12-21 ENCOUNTER — Encounter: Payer: Self-pay | Admitting: Licensed Clinical Social Worker

## 2021-12-21 NOTE — Progress Notes (Signed)
[place] CSW Progress Note ? ?Clinical Social Worker contacted patient by phone to follow-up on referral.  Patient stated she is doing okay, and she will just stay out of his [husband] way.  CSW asked patient if she was concerned for her safety, and she stated no.  Patient is looking at options for rentals but has concerns about the cost of renting a home on her own on her fixed income.  CSW and patient spoke about different options for rentals properties, like looking at one bedroom apartments and applying for public housing.  Patient stated she is saving for a new car because her current car has 200,000 miles. Patient stated she would contact CSW if she had any safety concerns, and would contact DSS if she wanted assistance.  Patient made it clear she would contact DSS on her own.   ? ? ? ?Olivia Buchanan , LCSW ?

## 2022-01-12 ENCOUNTER — Inpatient Hospital Stay: Payer: Medicare HMO

## 2022-01-12 ENCOUNTER — Inpatient Hospital Stay: Payer: Medicare HMO | Admitting: Internal Medicine

## 2022-01-12 ENCOUNTER — Inpatient Hospital Stay: Payer: Medicare HMO | Attending: Internal Medicine

## 2022-01-12 ENCOUNTER — Encounter: Payer: Self-pay | Admitting: Internal Medicine

## 2022-01-12 DIAGNOSIS — J439 Emphysema, unspecified: Secondary | ICD-10-CM | POA: Insufficient documentation

## 2022-01-12 DIAGNOSIS — C3411 Malignant neoplasm of upper lobe, right bronchus or lung: Secondary | ICD-10-CM

## 2022-01-12 DIAGNOSIS — E876 Hypokalemia: Secondary | ICD-10-CM | POA: Insufficient documentation

## 2022-01-12 DIAGNOSIS — Z5112 Encounter for antineoplastic immunotherapy: Secondary | ICD-10-CM | POA: Insufficient documentation

## 2022-01-12 DIAGNOSIS — I700 Atherosclerosis of aorta: Secondary | ICD-10-CM | POA: Insufficient documentation

## 2022-01-12 DIAGNOSIS — D491 Neoplasm of unspecified behavior of respiratory system: Secondary | ICD-10-CM

## 2022-01-12 LAB — COMPREHENSIVE METABOLIC PANEL
ALT: 21 U/L (ref 0–44)
AST: 35 U/L (ref 15–41)
Albumin: 4 g/dL (ref 3.5–5.0)
Alkaline Phosphatase: 61 U/L (ref 38–126)
Anion gap: 11 (ref 5–15)
BUN: 14 mg/dL (ref 8–23)
CO2: 29 mmol/L (ref 22–32)
Calcium: 9.4 mg/dL (ref 8.9–10.3)
Chloride: 99 mmol/L (ref 98–111)
Creatinine, Ser: 0.56 mg/dL (ref 0.44–1.00)
GFR, Estimated: >60 mL/min (ref >60)
Glucose, Bld: 164 mg/dL — ABNORMAL HIGH (ref 70–99)
Potassium: 3.3 mmol/L — ABNORMAL LOW (ref 3.5–5.1)
Sodium: 139 mmol/L (ref 135–145)
Total Bilirubin: 0.5 mg/dL (ref 0.3–1.2)
Total Protein: 7.8 g/dL (ref 6.5–8.1)

## 2022-01-12 LAB — CBC WITH DIFFERENTIAL/PLATELET
Abs Immature Granulocytes: 0.02 10*3/uL (ref 0.00–0.07)
Basophils Absolute: 0.1 10*3/uL (ref 0.0–0.1)
Basophils Relative: 1 %
Eosinophils Absolute: 0.4 10*3/uL (ref 0.0–0.5)
Eosinophils Relative: 5 %
HCT: 36.8 % (ref 36.0–46.0)
Hemoglobin: 12.2 g/dL (ref 12.0–15.0)
Immature Granulocytes: 0 %
Lymphocytes Relative: 29 %
Lymphs Abs: 2.2 10*3/uL (ref 0.7–4.0)
MCH: 31.3 pg (ref 26.0–34.0)
MCHC: 33.2 g/dL (ref 30.0–36.0)
MCV: 94.4 fL (ref 80.0–100.0)
Monocytes Absolute: 0.6 10*3/uL (ref 0.1–1.0)
Monocytes Relative: 7 %
Neutro Abs: 4.4 10*3/uL (ref 1.7–7.7)
Neutrophils Relative %: 58 %
Platelets: 157 10*3/uL (ref 150–400)
RBC: 3.9 MIL/uL (ref 3.87–5.11)
RDW: 13.2 % (ref 11.5–15.5)
WBC: 7.6 10*3/uL (ref 4.0–10.5)
nRBC: 0 % (ref 0.0–0.2)

## 2022-01-12 MED ORDER — SODIUM CHLORIDE 0.9 % IV SOLN
Freq: Once | INTRAVENOUS | Status: AC
  Filled 2022-01-12: qty 250

## 2022-01-12 MED ORDER — SODIUM CHLORIDE 0.9 % IV SOLN
1200.0000 mg | Freq: Once | INTRAVENOUS | Status: AC
  Administered 2022-01-12: 1200 mg via INTRAVENOUS
  Filled 2022-01-12: qty 20

## 2022-01-12 MED ORDER — HEPARIN SOD (PORK) LOCK FLUSH 100 UNIT/ML IV SOLN
500.0000 [IU] | Freq: Once | INTRAVENOUS | Status: AC | PRN
  Administered 2022-01-12: 500 [IU]
  Filled 2022-01-12: qty 5

## 2022-01-12 NOTE — Progress Notes (Signed)
Has concerns about the immunotherapy and how it may make her feel. ?

## 2022-01-12 NOTE — Assessment & Plan Note (Addendum)
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 Currently s/p adjuvant carbo-taxol x4 [poor candidate for cisplatin].  PD-L1-5%; recommend adjuvant immunotherapy for total of 1 year.MARCH 15th, 2023-no evidence of any recurrent disease; stable mediastinal lymph node/previously PET negative; fibrotic/bronchitic changes noted bilateral lower lobes [ex-smoker] ? ?# Proceed with  Adjuvant Atezoluzimab. Labs today reviewed;  acceptable for treatment today. March 2023-TSH normal. ? I discussed the mechanism of action; The goal of therapy is palliative; and length of treatments are likely ongoing/based upon the results of the scans. Discussed the potential side effects of immunotherapy including but not limited to diarrhea; skin rash; elevated LFTs/endocrine abnormalities etc. ? ?# Hypokalemia: Kdur 3.3 [HCTZ]- STABLE- continue Kdur BID.  ? 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 continue-vit D 50,000-STABLE.   ? ?# chronic back pain/chronic left LE/Bil PN-history of cervical spine injury-no evidence of metastatic disease.  Continue hydrocodone [for now as per Dr. Sparks]. STABLE ? ?# History of diabetes- continue metformin 500 mg BID; STABLE; await Hb A1c-  ? ?#Incidental findings on Imaging  March 2023: Aortic sclerosis; atherosclerosis emphysema I reviewed/discussed/counseled the patient.  ? ?# DISPOSITION: ?# proceed with Atezoluzumab ?# follow up in 3 weeks MD; labs- cbc/cmp-; Atzeoluzimab- Dr.B  ? ?Cc: Dr.A/Dr.Sparks ? ? ?

## 2022-01-12 NOTE — Progress Notes (Signed)
[place] ?CONSULT NOTE ? ?Patient Care Team: ?Idelle Crouch, MD as PCP - General (Internal Medicine) ?Telford Nab, RN as Sales executive ? ?CHIEF COMPLAINTS/PURPOSE OF CONSULTATION: lung cancer ? 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 Spiculated mass of the posterior right pulmonary apex measuring ?3.2 x 2.8 cm, consistent with primary lung malignancy. ?2. Enlarged right hilar and pretracheal lymph nodes measuring up to ?1.6 x 1.5 cm, suspicious for nodal metastatic disease. ?3. Recommend multidisciplinary thoracic referral for consideration ?of PET-CT metabolic characterization and tissue sampling. ?4. Background of very fine centrilobular nodularity throughout the ?lungs, most concentrated at the apices. Scattered bronchiolar ?plugging. Findings are most consistent with smoking-related ?respiratory bronchiolitis. ?5. Coarse, nodular contour of the liver in the included upper ?abdomen, suggestive of cirrhosis. ?6. Coronary artery disease. ? ?# COPD- ? ?# DM- diet controlled; chronic back pain- hydrocodone 5 day; chronic left lower extremity weakness.  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 ?# NGS/MOLECULAR TESTS: ? ? ? ?# PALLIATIVE CARE EVALUATION: ? ?# PAIN MANAGEMENT:  ? ? ?DIAGNOSIS:  ? ?STAGE:         ;  GOALS: ? ?CURRENT/MOST RECENT THERAPY :   ? ?A. LYMPH NODE, HILAR, EXCISION:  ?- Lymph node negative for metastatic carcinoma.  ? ?B. LYMPH NODE, LEVEL 4, EXCISION:  ?- Lymph node negative for metastatic carcinoma.  ? ?C. LUNG, RIGHT UPPER LOBE, LOBECTOMY:  ?- Squamous cell carcinoma, 4.5 cm.  ?- Carcinoma extends through visceral pleura and focally involves  ?parietal pleura.  ?- Surgical margins negative for carcinoma.  ?- One lymph node negative for carcinoma.  ?- See oncology table and comment.  ? ?D. SOFT TISSUE, CHEST WALL, BIOPSY:  ?- Fibroadipose tissue with  focal inflammation and fibrosis.  ?- No carcinoma identified.  ? ?ONCOLOGY TABLE:  ?LUNG: Resection  ?Synchronous Tumors: Not applicable.  ?Total Number of Primary Tumors: 1  ?Procedure: Right upper lobectomy with lymph node biopsies and chest wall  ?biopsy.  ?Specimen Laterality: Right upper lobe.  ?Tumor Focality: Unifocal.  ?Tumor Site: Right upper lobe.  ?Tumor Size: 4.5 x 4 x 3 cm.  ?Histologic Type: Squamous cell carcinoma.  ?Visceral Pleura Invasion: Present.  ?Direct Invasion of Adjacent Structures: Focal involvement of parietal  ?pleura.  ?Lymphovascular Invasion: Not identified.  ?Margins: All surgical margins negative for carcinoma.  ?Regional Lymph Nodes:  ?     Number of Lymph Nodes Involved: 0  ?                          Nodal Sites with Tumor: 0  ?     Number of Lymph Nodes Examined: 3  ?                     Nodal Sites Examined: 2  ?Pathologic Stage Classification (pTNM, AJCC 8th Edition): pT3, pN0  ?Ancillary Studies: Can be performed if requested.  ? ?#Right upper lobe stage IIb-pT3 [not cisplatin candidate]; CarboTaxol x4. April 11th, 2023-Atezo q 3 w x1 year ?  ?Malignant neoplasm of upper lobe of right lung ([place])  ?04/24/2021 Initial Diagnosis  ? Malignant neoplasm of upper lobe of right lung ([place]) ?  ?05/02/2021 Cancer Staging  ? Staging form: Lung, AJCC 8th Edition ?- Clinical: Stage IB (cT2a, cN0, cM0) - Signed by Cammie Sickle, MD on 05/02/2021 ?Stage prefix: Initial  diagnosis ? ?  ?07/23/2021 Cancer Staging  ? Staging form: Lung, AJCC 8th Edition ?- Pathologic: Stage IIB (pT3, pN0, cM0) - Signed by Cammie Sickle, MD on 07/23/2021 ?Stage prefix: Initial diagnosis ? ?  ? ?HISTORY OF PRESENTING ILLNESS: Patient ambulating independently.  Accompanied by daughter, Caryl Pina. ? ?Olivia Buchanan 71 y.o.  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 ? ?Patient denies any worsening tingling or numbness.  Denies any falls.  States her breathing is improved.  She admits that her quality of life extremely poor while on chemotherapy. ? ?Continues to have chronic back pain.  No headaches.  No chest pain. ? ?Review of Systems  ?Constitutional:  Positive for malaise/fatigue and weight loss. Negative for chills, diaphoresis and fever.  ?HENT:  Negative for nosebleeds and sore throat.   ?Eyes:  Negative for double vision.  ?Respiratory:  Positive for cough and shortness of breath. Negative for hemoptysis and wheezing.   ?Cardiovascular:  Negative for chest pain, palpitations, orthopnea and leg swelling.  ?Gastrointestinal:  Negative for abdominal pain, blood in stool, constipation, diarrhea, heartburn, melena and vomiting.  ?Genitourinary:  Negative for dysuria, frequency and urgency.  ?Musculoskeletal:  Positive for back pain, joint pain and neck pain.  ?Skin: Negative.  Negative for itching and rash.  ?Neurological:  Negative for tingling, focal weakness and weakness.  ?Endo/Heme/Allergies:  Does not bruise/bleed easily.  ?Psychiatric/Behavioral:  Negative for depression. The patient is not nervous/anxious and does not have insomnia.    ? ?MEDICAL HISTORY:  ?Past Medical History:  ?Diagnosis Date  ? Angioedema 12/08/2019  ? Anxiety   ? Aortic atherosclerosis ([place])   ? Asthma   ? CAD (coronary artery disease)   ? 3 vessel  ? Cancer [facility])   ? COPD (chronic obstructive pulmonary disease) ([place]) 12/08/2019  ? DDD (degenerative disc disease), lumbar   ? Depression   ? Diabetes mellitus without complication ([place])   ? no meds, diet controlled  ? Dyspnea   ? GERD (gastroesophageal reflux disease)   ? HTN (hypertension) 12/08/2019  ? Hyperlipidemia   ? Hypertension   ? Mass of upper lobe of right lung 04/02/2021  ? spiculated mass RIGHT posterior pulmonary apex with associated hilar and pretracheal LAD; measured 3.2 x 2.8 cm  ? Obesity (BMI 30-39.9)   ? T2DM (type 2  diabetes mellitus) ([place]) 12/08/2019  ? ? ?SURGICAL HISTORY: ?Past Surgical History:  ?Procedure Laterality Date  ? BACK SURGERY    ? lumbar L5-S1 ruptured disc x 3 surgeries  ? CERVICAL FUSION  2009  ? FINE NEEDLE ASPIRATION  06/23/2021  ? Procedure: FINE NEEDLE ASPIRATION (FNA) LINEAR;  Surgeon: Garner Nash, DO;  Location: [place] [facility];  Service: Pulmonary;;  ? INTERCOSTAL NERVE BLOCK Right 07/09/2021  ? Procedure: INTERCOSTAL NERVE BLOCK;  Surgeon: Lajuana Matte, MD;  Location: [place];  Service: Thoracic;  Laterality: Right;  ? IR IMAGING GUIDED PORT INSERTION  08/06/2021  ? LAPAROSCOPIC TOTAL HYSTERECTOMY  1998  ? with oophorectomy  ? LYMPH NODE DISSECTION Right 07/09/2021  ? Procedure: LYMPH NODE DISSECTION;  Surgeon: Lajuana Matte, MD;  Location: [place];  Service: Thoracic;  Laterality: Right;  ? TONSILLECTOMY    ? VIDEO BRONCHOSCOPY WITH ENDOBRONCHIAL NAVIGATION N/A 04/15/2021  ? Procedure: VIDEO BRONCHOSCOPY WITH ENDOBRONCHIAL NAVIGATION;  Surgeon: Ottie Glazier, MD;  Location: [facility];  Service: Thoracic;  Laterality: N/A;  ? VIDEO BRONCHOSCOPY WITH ENDOBRONCHIAL ULTRASOUND N/A 04/15/2021  ? Procedure: VIDEO BRONCHOSCOPY WITH ENDOBRONCHIAL ULTRASOUND;  Surgeon: Ottie Glazier, MD;  Location: [facility];  Service: Thoracic;  Laterality: N/A;  ? VIDEO BRONCHOSCOPY WITH ENDOBRONCHIAL ULTRASOUND N/A 06/23/2021  ? Procedure: VIDEO BRONCHOSCOPY WITH ENDOBRONCHIAL ULTRASOUND;  Surgeon: Garner Nash, DO;  Location: [place];  Service: Pulmonary;  Laterality: N/A;  ? ? ?SOCIAL HISTORY: ?Social History  ? ?Socioeconomic History  ? Marital status: Married  ?  Spouse name: Orpah Greek  ? Number of children: 3  ? Years of education: Not on file  ? Highest education level: Not on file  ?Occupational History  ? Not on file  ?Tobacco Use  ? Smoking status: Former  ?  Packs/day: 3.00  ?  Years: 50.00  ?  Pack years: 150.00  ?  Types: Cigarettes  ?  Quit date: 2019  ?  Years since quitting: 4.2  ? Smokeless  tobacco: Never  ?Vaping Use  ? Vaping Use: Never used  ?Substance and Sexual Activity  ? Alcohol use: Not Currently  ? Drug use: Never  ? Sexual activity: Not on file  ?  Comment: Hysterectomy  ?Other Topics Concern

## 2022-01-12 NOTE — Patient Instructions (Addendum)
?Atezolizumab injection ?What is this medication? ?ATEZOLIZUMAB (a te zoe LIZ ue mab) is a monoclonal antibody. It is used to treat bladder cancer (urothelial cancer), liver cancer, lung cancer, and melanoma. ?This medicine may be used for other purposes; ask your health care provider or pharmacist if you have questions. ?COMMON BRAND NAME(S): Tecentriq ?What should I tell my care team before I take this medication? ?They need to know if you have any of these conditions: ?autoimmune diseases like Crohn's disease, ulcerative colitis, or lupus ?have had or planning to have an allogeneic stem cell transplant (uses someone else's stem cells) ?history of organ transplant ?history of radiation to the chest ?nervous system problems like myasthenia gravis or Guillain-Barre syndrome ?an unusual or allergic reaction to atezolizumab, other medicines, foods, dyes, or preservatives ?pregnant or trying to get pregnant ?breast-feeding ?How should I use this medication? ?This medicine is for infusion into a vein. It is given by a health care professional in a hospital or clinic setting. ?A special MedGuide will be given to you before each treatment. Be sure to read this information carefully each time. ?Talk to your pediatrician regarding the use of this medicine in children. Special care may be needed. ?Overdosage: If you think you have taken too much of this medicine contact a poison control center or emergency room at once. ?NOTE: This medicine is only for you. Do not share this medicine with others. ?What if I miss a dose? ?It is important not to miss your dose. Call your doctor or health care professional if you are unable to keep an appointment. ?What may interact with this medication? ?Interactions have not been studied. ?This list may not describe all possible interactions. Give your health care provider a list of all the medicines, herbs, non-prescription drugs, or dietary supplements you use. Also tell them if you smoke,  drink alcohol, or use illegal drugs. Some items may interact with your medicine. ?What should I watch for while using this medication? ?Your condition will be monitored carefully while you are receiving this medicine. ?You may need blood work done while you are taking this medicine. ?Do not become pregnant while taking this medicine or for at least 5 months after stopping it. Women should inform their doctor if they wish to become pregnant or think they might be pregnant. There is a potential for serious side effects to an unborn child. Talk to your health care professional or pharmacist for more information. Do not breast-feed an infant while taking this medicine or for at least 5 months after the last dose. ?What side effects may I notice from receiving this medication? ?Side effects that you should report to your doctor or health care professional as soon as possible: ?allergic reactions like skin rash, itching or hives, swelling of the face, lips, or tongue ?black, tarry stools ?bloody or watery diarrhea ?breathing problems ?changes in vision ?chest pain or chest tightness ?chills ?facial flushing ?fever ?headache ?signs and symptoms of high blood sugar such as dizziness; dry mouth; dry skin; fruity breath; nausea; stomach pain; increased hunger or thirst; increased urination ?signs and symptoms of liver injury like dark yellow or brown urine; general ill feeling or flu-like symptoms; light-colored stools; loss of appetite; nausea; right upper belly pain; unusually weak or tired; yellowing of the eyes or skin ?stomach pain ?trouble passing urine or change in the amount of urine ?Side effects that usually do not require medical attention (report to your doctor or health care professional if they continue or are  bothersome): ?bone pain ?cough ?diarrhea ?joint pain ?muscle pain ?muscle weakness ?swelling of arms or legs ?tiredness ?weight loss ?This list may not describe all possible side effects. Call your doctor  for medical advice about side effects. You may report side effects to FDA at 1-800-FDA-1088. ?Where should I keep my medication? ?This drug is given in a hospital or clinic and will not be stored at home. ?NOTE: This sheet is a summary. It may not cover all possible information. If you have questions about this medicine, talk to your doctor, pharmacist, or health care provider. ?? 2022 Elsevier/Gold Standard (2021-06-09 00:00:00) ? ?[facility] CANCER CTR AT [place]  Discharge Instructions: ?Thank you for choosing [facility] to provide your oncology and hematology care.  ?If you have a lab appointment with the [facility], please go directly to the [facility] and check in at the registration area. ? ?Wear comfortable clothing and clothing appropriate for easy access to any Portacath or PICC line.  ? ?We strive to give you quality time with your provider. You may need to reschedule your appointment if you arrive late (15 or more minutes).  Arriving late affects you and other patients whose appointments are after yours.  Also, if you miss three or more appointments without notifying the office, you may be dismissed from the clinic at the provider?s discretion.    ?  ?For prescription refill requests, have your pharmacy contact our office and allow 72 hours for refills to be completed.   ? ?Today you received the following chemotherapy and/or immunotherapy agents: Gemzar    ?  ?To help prevent nausea and vomiting after your treatment, we encourage you to take your nausea medication as directed. ? ?BELOW ARE SYMPTOMS THAT SHOULD BE REPORTED IMMEDIATELY: ?*FEVER GREATER THAN 100.4 F (38 ?C) OR HIGHER ?*CHILLS OR SWEATING ?*NAUSEA AND VOMITING THAT IS NOT CONTROLLED WITH YOUR NAUSEA MEDICATION ?*UNUSUAL SHORTNESS OF BREATH ?*UNUSUAL BRUISING OR BLEEDING ?*URINARY PROBLEMS (pain or burning when urinating, or frequent urination) ?*BOWEL PROBLEMS (unusual diarrhea, constipation, pain near the  anus) ?TENDERNESS IN MOUTH AND THROAT WITH OR WITHOUT PRESENCE OF ULCERS (sore throat, sores in mouth, or a toothache) ?UNUSUAL RASH, SWELLING OR PAIN  ?UNUSUAL VAGINAL DISCHARGE OR ITCHING  ? ?Items with * indicate a potential emergency and should be followed up as soon as possible or go to the Emergency Department if any problems should occur. ? ?Please show the CHEMOTHERAPY ALERT CARD or IMMUNOTHERAPY ALERT CARD at check-in to the Emergency Department and triage nurse. ? ?Should you have questions after your visit or need to cancel or reschedule your appointment, please contact [facility] CANCER [place] AT [place]  [phone] and follow the prompts.  Office hours are 8:00 a.m. to 4:30 p.m. Monday - Friday. Please note that voicemails left after 4:00 p.m. may not be returned until the following business day.  We are closed weekends and major holidays. You have access to a nurse at all times for urgent questions. Please call the main number to the clinic [phone] and follow the prompts. ? ?For any non-urgent questions, you may also contact your provider using MyChart. We now offer e-Visits for anyone 56 and older to request care online for non-urgent symptoms. For details visit [email or web address]. ?  ?Also download the MyChart app! Go to the app store, search "MyChart", open the app, select [facility], and log in with your MyChart username and password. ? ?Due to Covid, a mask is required upon entering the hospital/clinic.  If you do not have a mask, one will be given to you upon arrival. For doctor visits, patients may have 1 support person aged 86 or older with them. For treatment visits, patients cannot have anyone with them due to current Covid guidelines and our immunocompromised population.  ? ?

## 2022-01-14 ENCOUNTER — Other Ambulatory Visit: Payer: Self-pay | Admitting: *Deleted

## 2022-01-14 DIAGNOSIS — D491 Neoplasm of unspecified behavior of respiratory system: Secondary | ICD-10-CM

## 2022-01-15 ENCOUNTER — Encounter: Payer: Self-pay | Admitting: Internal Medicine

## 2022-01-15 MED ORDER — ERGOCALCIFEROL 1.25 MG (50000 UT) PO CAPS
50000.0000 [IU] | ORAL_CAPSULE | ORAL | 1 refills | Status: DC
Start: 2022-01-15 — End: 2022-03-16

## 2022-01-15 MED ORDER — PROCHLORPERAZINE MALEATE 10 MG PO TABS: 10.0000 mg | ORAL_TABLET | Freq: Four times a day (QID) | ORAL | 1 refills | Status: DC | PRN

## 2022-02-03 ENCOUNTER — Inpatient Hospital Stay: Payer: Medicare HMO | Admitting: Internal Medicine

## 2022-02-03 ENCOUNTER — Inpatient Hospital Stay: Payer: Medicare HMO

## 2022-02-03 ENCOUNTER — Inpatient Hospital Stay: Payer: Medicare HMO | Attending: Internal Medicine

## 2022-02-03 ENCOUNTER — Encounter: Payer: Self-pay | Admitting: Internal Medicine

## 2022-02-03 DIAGNOSIS — Z5112 Encounter for antineoplastic immunotherapy: Secondary | ICD-10-CM | POA: Insufficient documentation

## 2022-02-03 DIAGNOSIS — D491 Neoplasm of unspecified behavior of respiratory system: Secondary | ICD-10-CM

## 2022-02-03 DIAGNOSIS — J449 Chronic obstructive pulmonary disease, unspecified: Secondary | ICD-10-CM | POA: Insufficient documentation

## 2022-02-03 DIAGNOSIS — C3411 Malignant neoplasm of upper lobe, right bronchus or lung: Secondary | ICD-10-CM | POA: Diagnosis not present

## 2022-02-03 DIAGNOSIS — G629 Polyneuropathy, unspecified: Secondary | ICD-10-CM | POA: Diagnosis not present

## 2022-02-03 DIAGNOSIS — E876 Hypokalemia: Secondary | ICD-10-CM | POA: Diagnosis not present

## 2022-02-03 DIAGNOSIS — E119 Type 2 diabetes mellitus without complications: Secondary | ICD-10-CM | POA: Insufficient documentation

## 2022-02-03 LAB — COMPREHENSIVE METABOLIC PANEL
ALT: 22 U/L (ref 0–44)
AST: 36 U/L (ref 15–41)
Albumin: 4 g/dL (ref 3.5–5.0)
Alkaline Phosphatase: 55 U/L (ref 38–126)
Anion gap: 12 (ref 5–15)
BUN: 13 mg/dL (ref 8–23)
CO2: 28 mmol/L (ref 22–32)
Calcium: 9.4 mg/dL (ref 8.9–10.3)
Chloride: 98 mmol/L (ref 98–111)
Creatinine, Ser: 0.66 mg/dL (ref 0.44–1.00)
GFR, Estimated: >60 mL/min (ref >60)
Glucose, Bld: 148 mg/dL — ABNORMAL HIGH (ref 70–99)
Potassium: 3.1 mmol/L — ABNORMAL LOW (ref 3.5–5.1)
Sodium: 138 mmol/L (ref 135–145)
Total Bilirubin: 0.5 mg/dL (ref 0.3–1.2)
Total Protein: 7.8 g/dL (ref 6.5–8.1)

## 2022-02-03 LAB — CBC WITH DIFFERENTIAL/PLATELET
Abs Immature Granulocytes: 0.01 10*3/uL (ref 0.00–0.07)
Basophils Absolute: 0.1 10*3/uL (ref 0.0–0.1)
Basophils Relative: 1 %
Eosinophils Absolute: 0.3 10*3/uL (ref 0.0–0.5)
Eosinophils Relative: 4 %
HCT: 36.1 % (ref 36.0–46.0)
Hemoglobin: 12.4 g/dL (ref 12.0–15.0)
Immature Granulocytes: 0 %
Lymphocytes Relative: 36 %
Lymphs Abs: 2.4 10*3/uL (ref 0.7–4.0)
MCH: 31.5 pg (ref 26.0–34.0)
MCHC: 34.3 g/dL (ref 30.0–36.0)
MCV: 91.6 fL (ref 80.0–100.0)
Monocytes Absolute: 0.5 10*3/uL (ref 0.1–1.0)
Monocytes Relative: 8 %
Neutro Abs: 3.4 10*3/uL (ref 1.7–7.7)
Neutrophils Relative %: 51 %
Platelets: 150 10*3/uL (ref 150–400)
RBC: 3.94 MIL/uL (ref 3.87–5.11)
RDW: 12.8 % (ref 11.5–15.5)
WBC: 6.7 10*3/uL (ref 4.0–10.5)
nRBC: 0 % (ref 0.0–0.2)

## 2022-02-03 MED ORDER — HEPARIN SOD (PORK) LOCK FLUSH 100 UNIT/ML IV SOLN
500.0000 [IU] | Freq: Once | INTRAVENOUS | Status: DC | PRN
  Filled 2022-02-03: qty 5

## 2022-02-03 MED ORDER — SODIUM CHLORIDE 0.9 % IV SOLN
Freq: Once | INTRAVENOUS | Status: AC
  Filled 2022-02-03: qty 250

## 2022-02-03 MED ORDER — SODIUM CHLORIDE 0.9 % IV SOLN
1200.0000 mg | Freq: Once | INTRAVENOUS | Status: AC
  Administered 2022-02-03: 1200 mg via INTRAVENOUS
  Filled 2022-02-03: qty 20

## 2022-02-03 NOTE — Progress Notes (Signed)
[facility] ?CONSULT NOTE ? ?Patient Care Team: ?Idelle Crouch, MD as PCP - General (Internal Medicine) ?Telford Nab, RN as Sales executive ? ?CHIEF COMPLAINTS/PURPOSE OF CONSULTATION: lung cancer ? 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 Spiculated mass of the posterior right pulmonary apex measuring ?3.2 x 2.8 cm, consistent with primary lung malignancy. ?2. Enlarged right hilar and pretracheal lymph nodes measuring up to ?1.6 x 1.5 cm, suspicious for nodal metastatic disease. ?3. Recommend multidisciplinary thoracic referral for consideration ?of PET-CT metabolic characterization and tissue sampling. ?4. Background of very fine centrilobular nodularity throughout the ?lungs, most concentrated at the apices. Scattered bronchiolar ?plugging. Findings are most consistent with smoking-related ?respiratory bronchiolitis. ?5. Coarse, nodular contour of the liver in the included upper ?abdomen, suggestive of cirrhosis. ?6. Coronary artery disease. ? ?# COPD- ? ?# DM- diet controlled; chronic back pain- hydrocodone 5 day; chronic left lower extremity weakness.  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 ?# NGS/MOLECULAR TESTS: ? ? ? ?# PALLIATIVE CARE EVALUATION: ? ?# PAIN MANAGEMENT:  ? ? ?DIAGNOSIS:  ? ?STAGE:         ;  GOALS: ? ?CURRENT/MOST RECENT THERAPY :   ? ?A. LYMPH NODE, HILAR, EXCISION:  ?- Lymph node negative for metastatic carcinoma.  ? ?B. LYMPH NODE, LEVEL 4, EXCISION:  ?- Lymph node negative for metastatic carcinoma.  ? ?C. LUNG, RIGHT UPPER LOBE, LOBECTOMY:  ?- Squamous cell carcinoma, 4.5 cm.  ?- Carcinoma extends through visceral pleura and focally involves  ?parietal pleura.  ?- Surgical margins negative for carcinoma.  ?- One lymph node negative for carcinoma.  ?- See oncology table and comment.  ? ?D. SOFT TISSUE, CHEST WALL, BIOPSY:  ?- Fibroadipose tissue with  focal inflammation and fibrosis.  ?- No carcinoma identified.  ? ?ONCOLOGY TABLE:  ?LUNG: Resection  ?Synchronous Tumors: Not applicable.  ?Total Number of Primary Tumors: 1  ?Procedure: Right upper lobectomy with lymph node biopsies and chest wall  ?biopsy.  ?Specimen Laterality: Right upper lobe.  ?Tumor Focality: Unifocal.  ?Tumor Site: Right upper lobe.  ?Tumor Size: 4.5 x 4 x 3 cm.  ?Histologic Type: Squamous cell carcinoma.  ?Visceral Pleura Invasion: Present.  ?Direct Invasion of Adjacent Structures: Focal involvement of parietal  ?pleura.  ?Lymphovascular Invasion: Not identified.  ?Margins: All surgical margins negative for carcinoma.  ?Regional Lymph Nodes:  ?     Number of Lymph Nodes Involved: 0  ?                          Nodal Sites with Tumor: 0  ?     Number of Lymph Nodes Examined: 3  ?                     Nodal Sites Examined: 2  ?Pathologic Stage Classification (pTNM, AJCC 8th Edition): pT3, pN0  ?Ancillary Studies: Can be performed if requested.  ? ?#Right upper lobe stage IIb-pT3 [not cisplatin candidate]; CarboTaxol x4. April 11th, 2023-Atezo q 3 w x1 year ?  ?Malignant neoplasm of upper lobe of right lung ([place])  ?04/24/2021 Initial Diagnosis  ? Malignant neoplasm of upper lobe of right lung ([place]) ?  ?05/02/2021 Cancer Staging  ? Staging form: Lung, AJCC 8th Edition ?- Clinical: Stage IB (cT2a, cN0, cM0) - Signed by Cammie Sickle, MD on 05/02/2021 ?Stage prefix: Initial  diagnosis ? ?  ?07/23/2021 Cancer Staging  ? Staging form: Lung, AJCC 8th Edition ?- Pathologic: Stage IIB (pT3, pN0, cM0) - Signed by Cammie Sickle, MD on 07/23/2021 ?Stage prefix: Initial diagnosis ? ?  ? ?HISTORY OF PRESENTING ILLNESS: Patient ambulating independently.  Alone.  ? ?Olivia Buchanan 71 y.o.  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 ? ?Mild nausea no vomiting.  No worsening tingling or numbness.  Chronic back pain.  Breathing is improved. ? ? No  headaches.  No chest pain. ? ?Review of Systems  ?Constitutional:  Positive for malaise/fatigue and weight loss. Negative for chills, diaphoresis and fever.  ?HENT:  Negative for nosebleeds and sore throat.   ?Eyes:  Negative for double vision.  ?Respiratory:  Positive for cough and shortness of breath. Negative for hemoptysis and wheezing.   ?Cardiovascular:  Negative for chest pain, palpitations, orthopnea and leg swelling.  ?Gastrointestinal:  Negative for abdominal pain, blood in stool, constipation, diarrhea, heartburn, melena and vomiting.  ?Genitourinary:  Negative for dysuria, frequency and urgency.  ?Musculoskeletal:  Positive for back pain, joint pain and neck pain.  ?Skin: Negative.  Negative for itching and rash.  ?Neurological:  Negative for tingling, focal weakness and weakness.  ?Endo/Heme/Allergies:  Does not bruise/bleed easily.  ?Psychiatric/Behavioral:  Negative for depression. The patient is not nervous/anxious and does not have insomnia.    ? ?MEDICAL HISTORY:  ?Past Medical History:  ?Diagnosis Date  ? Angioedema 12/08/2019  ? Anxiety   ? Aortic atherosclerosis ([place])   ? Asthma   ? CAD (coronary artery disease)   ? 3 vessel  ? Cancer [facility])   ? COPD (chronic obstructive pulmonary disease) ([place]) 12/08/2019  ? DDD (degenerative disc disease), lumbar   ? Depression   ? Diabetes mellitus without complication ([place])   ? no meds, diet controlled  ? Dyspnea   ? GERD (gastroesophageal reflux disease)   ? HTN (hypertension) 12/08/2019  ? Hyperlipidemia   ? Hypertension   ? Mass of upper lobe of right lung 04/02/2021  ? spiculated mass RIGHT posterior pulmonary apex with associated hilar and pretracheal LAD; measured 3.2 x 2.8 cm  ? Obesity (BMI 30-39.9)   ? T2DM (type 2 diabetes mellitus) ([place]) 12/08/2019  ? ? ?SURGICAL HISTORY: ?Past Surgical History:  ?Procedure Laterality Date  ? BACK SURGERY    ? lumbar L5-S1 ruptured disc x 3 surgeries  ? CERVICAL FUSION  2009  ? FINE NEEDLE ASPIRATION  06/23/2021  ?  Procedure: FINE NEEDLE ASPIRATION (FNA) LINEAR;  Surgeon: Garner Nash, DO;  Location: [facility];  Service: Pulmonary;;  ? INTERCOSTAL NERVE BLOCK Right 07/09/2021  ? Procedure: INTERCOSTAL NERVE BLOCK;  Surgeon: Lajuana Matte, MD;  Location: [facility];  Service: Thoracic;  Laterality: Right;  ? IR IMAGING GUIDED PORT INSERTION  08/06/2021  ? LAPAROSCOPIC TOTAL HYSTERECTOMY  1998  ? with oophorectomy  ? LYMPH NODE DISSECTION Right 07/09/2021  ? Procedure: LYMPH NODE DISSECTION;  Surgeon: Lajuana Matte, MD;  Location: [facility];  Service: Thoracic;  Laterality: Right;  ? TONSILLECTOMY    ? VIDEO BRONCHOSCOPY WITH ENDOBRONCHIAL NAVIGATION N/A 04/15/2021  ? Procedure: VIDEO BRONCHOSCOPY WITH ENDOBRONCHIAL NAVIGATION;  Surgeon: Ottie Glazier, MD;  Location: [facility];  Service: Thoracic;  Laterality: N/A;  ? VIDEO BRONCHOSCOPY WITH ENDOBRONCHIAL ULTRASOUND N/A 04/15/2021  ? Procedure: VIDEO BRONCHOSCOPY WITH ENDOBRONCHIAL ULTRASOUND;  Surgeon: Ottie Glazier, MD;  Location: [facility];  Service: Thoracic;  Laterality: N/A;  ?  VIDEO BRONCHOSCOPY WITH ENDOBRONCHIAL ULTRASOUND N/A 06/23/2021  ? Procedure: VIDEO BRONCHOSCOPY WITH ENDOBRONCHIAL ULTRASOUND;  Surgeon: Garner Nash, DO;  Location: [facility];  Service: Pulmonary;  Laterality: N/A;  ? ? ?SOCIAL HISTORY: ?Social History  ? ?Socioeconomic History  ? Marital status: Married  ?  Spouse name: Orpah Greek  ? Number of children: 3  ? Years of education: Not on file  ? Highest education level: Not on file  ?Occupational History  ? Not on file  ?Tobacco Use  ? Smoking status: Former  ?  Packs/day: 3.00  ?  Years: 50.00  ?  Pack years: 150.00  ?  Types: Cigarettes  ?  Quit date: 2019  ?  Years since quitting: 4.3  ? Smokeless tobacco: Never  ?Vaping Use  ? Vaping Use: Never used  ?Substance and Sexual Activity  ? Alcohol use: Not Currently  ? Drug use: Never  ? Sexual activity: Not on file  ?  Comment: Hysterectomy  ?Other Topics Concern  ? Not on file  ?Social  History Narrative  ? Lives in home; with husband; lives in [place]; never smoking 2019; no alcohol. 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   ? ?Social Determinants of Health  ? ?Financial

## 2022-02-03 NOTE — Progress Notes (Signed)
C/o spot in b/w lungs that was discussed at her last visit. ?

## 2022-02-03 NOTE — Patient Instructions (Signed)
[facility] CANCER CTR AT [place]  Discharge Instructions: ?Thank you for choosing [facility] to provide your oncology and hematology care.  ?If you have a lab appointment with the [facility], please go directly to the [facility] and check in at the registration area. ? ?Wear comfortable clothing and clothing appropriate for easy access to any Portacath or PICC line.  ? ?We strive to give you quality time with your provider. You may need to reschedule your appointment if you arrive late (15 or more minutes).  Arriving late affects you and other patients whose appointments are after yours.  Also, if you miss three or more appointments without notifying the office, you may be dismissed from the clinic at the provider?s discretion.    ?  ?For prescription refill requests, have your pharmacy contact our office and allow 72 hours for refills to be completed.   ? ?Today you received the following chemotherapy and/or immunotherapy agents: Tecentriq    ?  ?To help prevent nausea and vomiting after your treatment, we encourage you to take your nausea medication as directed. ? ?BELOW ARE SYMPTOMS THAT SHOULD BE REPORTED IMMEDIATELY: ?*FEVER GREATER THAN 100.4 F (38 ?C) OR HIGHER ?*CHILLS OR SWEATING ?*NAUSEA AND VOMITING THAT IS NOT CONTROLLED WITH YOUR NAUSEA MEDICATION ?*UNUSUAL SHORTNESS OF BREATH ?*UNUSUAL BRUISING OR BLEEDING ?*URINARY PROBLEMS (pain or burning when urinating, or frequent urination) ?*BOWEL PROBLEMS (unusual diarrhea, constipation, pain near the anus) ?TENDERNESS IN MOUTH AND THROAT WITH OR WITHOUT PRESENCE OF ULCERS (sore throat, sores in mouth, or a toothache) ?UNUSUAL RASH, SWELLING OR PAIN  ?UNUSUAL VAGINAL DISCHARGE OR ITCHING  ? ?Items with * indicate a potential emergency and should be followed up as soon as possible or go to the Emergency Department if any problems should occur. ? ?Please show the CHEMOTHERAPY ALERT CARD or IMMUNOTHERAPY ALERT CARD at check-in to  the Emergency Department and triage nurse. ? ?Should you have questions after your visit or need to cancel or reschedule your appointment, please contact [facility] CANCER [place] AT [place]  [phone] and follow the prompts.  Office hours are 8:00 a.m. to 4:30 p.m. Monday - Friday. Please note that voicemails left after 4:00 p.m. may not be returned until the following business day.  We are closed weekends and major holidays. You have access to a nurse at all times for urgent questions. Please call the main number to the clinic [phone] and follow the prompts. ? ?For any non-urgent questions, you may also contact your provider using MyChart. We now offer e-Visits for anyone 1 and older to request care online for non-urgent symptoms. For details visit [email or web address]. ?  ?Also download the MyChart app! Go to the app store, search "MyChart", open the app, select , and log in with your MyChart username and password. ? ?Due to Covid, a mask is required upon entering the hospital/clinic. If you do not have a mask, one will be given to you upon arrival. For doctor visits, patients may have 1 support person aged 41 or older with them. For treatment visits, patients cannot have anyone with them due to current Covid guidelines and our immunocompromised population. Atezolizumab injection ?What is this medication? ?ATEZOLIZUMAB (a te zoe LIZ ue mab) is a monoclonal antibody. It is used to treat bladder cancer (urothelial cancer), liver cancer, lung cancer, and melanoma. ?This medicine may be used for other purposes; ask your health care provider or pharmacist if you have questions. ?COMMON BRAND NAME(S): Tecentriq ?What should  I tell my care team before I take this medication? ?They need to know if you have any of these conditions: ?autoimmune diseases like Crohn's disease, ulcerative colitis, or lupus ?have had or planning to have an allogeneic stem cell transplant (uses someone else's stem  cells) ?history of organ transplant ?history of radiation to the chest ?nervous system problems like myasthenia gravis or Guillain-Barre syndrome ?an unusual or allergic reaction to atezolizumab, other medicines, foods, dyes, or preservatives ?pregnant or trying to get pregnant ?breast-feeding ?How should I use this medication? ?This medicine is for infusion into a vein. It is given by a health care professional in a hospital or clinic setting. ?A special MedGuide will be given to you before each treatment. Be sure to read this information carefully each time. ?Talk to your pediatrician regarding the use of this medicine in children. Special care may be needed. ?Overdosage: If you think you have taken too much of this medicine contact a poison control center or emergency room at once. ?NOTE: This medicine is only for you. Do not share this medicine with others. ?What if I miss a dose? ?It is important not to miss your dose. Call your doctor or health care professional if you are unable to keep an appointment. ?What may interact with this medication? ?Interactions have not been studied. ?This list may not describe all possible interactions. Give your health care provider a list of all the medicines, herbs, non-prescription drugs, or dietary supplements you use. Also tell them if you smoke, drink alcohol, or use illegal drugs. Some items may interact with your medicine. ?What should I watch for while using this medication? ?Your condition will be monitored carefully while you are receiving this medicine. ?You may need blood work done while you are taking this medicine. ?Do not become pregnant while taking this medicine or for at least 5 months after stopping it. Women should inform their doctor if they wish to become pregnant or think they might be pregnant. There is a potential for serious side effects to an unborn child. Talk to your health care professional or pharmacist for more information. Do not breast-feed an  infant while taking this medicine or for at least 5 months after the last dose. ?What side effects may I notice from receiving this medication? ?Side effects that you should report to your doctor or health care professional as soon as possible: ?allergic reactions like skin rash, itching or hives, swelling of the face, lips, or tongue ?black, tarry stools ?bloody or watery diarrhea ?breathing problems ?changes in vision ?chest pain or chest tightness ?chills ?facial flushing ?fever ?headache ?signs and symptoms of high blood sugar such as dizziness; dry mouth; dry skin; fruity breath; nausea; stomach pain; increased hunger or thirst; increased urination ?signs and symptoms of liver injury like dark yellow or brown urine; general ill feeling or flu-like symptoms; light-colored stools; loss of appetite; nausea; right upper belly pain; unusually weak or tired; yellowing of the eyes or skin ?stomach pain ?trouble passing urine or change in the amount of urine ?Side effects that usually do not require medical attention (report to your doctor or health care professional if they continue or are bothersome): ?bone pain ?cough ?diarrhea ?joint pain ?muscle pain ?muscle weakness ?swelling of arms or legs ?tiredness ?weight loss ?This list may not describe all possible side effects. Call your doctor for medical advice about side effects. You may report side effects to FDA at 1-800-FDA-1088. ?Where should I keep my medication? ?This drug is given  in a hospital or clinic and will not be stored at home. ?NOTE: This sheet is a summary. It may not cover all possible information. If you have questions about this medicine, talk to your doctor, pharmacist, or health care provider. ?? 2023 Elsevier/Gold Standard (2021-08-21 00:00:00) ? ?

## 2022-02-03 NOTE — Assessment & Plan Note (Addendum)
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 Currently s/p adjuvant carbo-taxol x4 [poor candidate for cisplatin].  PD-L1-5%; MARCH 15th, 2023-no evidence of any recurrent disease; stable mediastinal lymph node/previously PET negative; fibrotic/bronchitic changes noted bilateral lower lobes [ex-smoker]; currently on adjuvant Atezolizumab. ? ?# Proceed with  Adjuvant Atezoluzimab. Labs today reviewed;  acceptable for treatment today. March 2023-TSH normal. ? ?# Hypokalemia: Kdur 3.1 [HCTZ]-overall STABLE- continue Kdur BID; monitor closely. ? 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 continue-vit D 50,000-STABLE.   ? ?# chronic back pain/chronic left LE/Bil PN-history of cervical spine injury-no evidence of metastatic disease.  Continue hydrocodone [for now as per Dr. Sparks]. STABLE ? ?# History of diabetes- continue metformin 500 mg BID; STABLE; MARCH 2023-Hb A1c-Hb A1C-5.9.   ? ? ?# DISPOSITION: ?# proceed with Atezoluzumab ?# follow up in 3 weeks MD; labs- cbc/cmp-; Atzeoluzimab- Dr.B  ? ?Cc: Dr.A/Dr.Sparks ? ? ?

## 2022-02-11 DIAGNOSIS — M5440 Lumbago with sciatica, unspecified side: Secondary | ICD-10-CM | POA: Diagnosis not present

## 2022-02-11 DIAGNOSIS — I10 Essential (primary) hypertension: Secondary | ICD-10-CM | POA: Diagnosis not present

## 2022-02-11 DIAGNOSIS — G8929 Other chronic pain: Secondary | ICD-10-CM | POA: Diagnosis not present

## 2022-02-11 DIAGNOSIS — E118 Type 2 diabetes mellitus with unspecified complications: Secondary | ICD-10-CM | POA: Diagnosis not present

## 2022-02-11 DIAGNOSIS — C3411 Malignant neoplasm of upper lobe, right bronchus or lung: Secondary | ICD-10-CM | POA: Diagnosis not present

## 2022-02-11 DIAGNOSIS — E7800 Pure hypercholesterolemia, unspecified: Secondary | ICD-10-CM | POA: Diagnosis not present

## 2022-02-22 ENCOUNTER — Telehealth: Payer: Self-pay | Admitting: Internal Medicine

## 2022-02-22 NOTE — Telephone Encounter (Signed)
Pt called and would like to cancel her 5/24 labs, MD, tx appt. Pt states she is sick and can't be here. Please advise on reschedule.

## 2022-02-24 ENCOUNTER — Inpatient Hospital Stay: Payer: Medicare HMO

## 2022-02-24 ENCOUNTER — Inpatient Hospital Stay: Payer: Medicare HMO | Admitting: Internal Medicine

## 2022-03-04 ENCOUNTER — Inpatient Hospital Stay: Payer: Medicare HMO

## 2022-03-04 ENCOUNTER — Encounter: Payer: Self-pay | Admitting: Internal Medicine

## 2022-03-04 ENCOUNTER — Inpatient Hospital Stay: Payer: Medicare HMO | Attending: Internal Medicine | Admitting: Internal Medicine

## 2022-03-04 ENCOUNTER — Other Ambulatory Visit: Payer: Self-pay

## 2022-03-04 VITALS — BP 106/74 | HR 86 | Temp 98.8°F | Ht 59.0 in

## 2022-03-04 DIAGNOSIS — E876 Hypokalemia: Secondary | ICD-10-CM | POA: Diagnosis not present

## 2022-03-04 DIAGNOSIS — J449 Chronic obstructive pulmonary disease, unspecified: Secondary | ICD-10-CM | POA: Diagnosis not present

## 2022-03-04 DIAGNOSIS — G620 Drug-induced polyneuropathy: Secondary | ICD-10-CM | POA: Diagnosis not present

## 2022-03-04 DIAGNOSIS — G8929 Other chronic pain: Secondary | ICD-10-CM | POA: Diagnosis not present

## 2022-03-04 DIAGNOSIS — F32A Depression, unspecified: Secondary | ICD-10-CM | POA: Diagnosis not present

## 2022-03-04 DIAGNOSIS — Z5112 Encounter for antineoplastic immunotherapy: Secondary | ICD-10-CM | POA: Diagnosis not present

## 2022-03-04 DIAGNOSIS — E119 Type 2 diabetes mellitus without complications: Secondary | ICD-10-CM | POA: Insufficient documentation

## 2022-03-04 DIAGNOSIS — M549 Dorsalgia, unspecified: Secondary | ICD-10-CM | POA: Diagnosis not present

## 2022-03-04 DIAGNOSIS — C3411 Malignant neoplasm of upper lobe, right bronchus or lung: Secondary | ICD-10-CM | POA: Diagnosis not present

## 2022-03-04 DIAGNOSIS — Z79899 Other long term (current) drug therapy: Secondary | ICD-10-CM | POA: Diagnosis not present

## 2022-03-04 DIAGNOSIS — D491 Neoplasm of unspecified behavior of respiratory system: Secondary | ICD-10-CM

## 2022-03-04 DIAGNOSIS — F419 Anxiety disorder, unspecified: Secondary | ICD-10-CM | POA: Insufficient documentation

## 2022-03-04 LAB — COMPREHENSIVE METABOLIC PANEL
ALT: 25 U/L (ref 0–44)
AST: 38 U/L (ref 15–41)
Albumin: 3.7 g/dL (ref 3.5–5.0)
Alkaline Phosphatase: 53 U/L (ref 38–126)
Anion gap: 9 (ref 5–15)
BUN: 12 mg/dL (ref 8–23)
CO2: 29 mmol/L (ref 22–32)
Calcium: 9.1 mg/dL (ref 8.9–10.3)
Chloride: 99 mmol/L (ref 98–111)
Creatinine, Ser: 0.71 mg/dL (ref 0.44–1.00)
GFR, Estimated: >60 mL/min (ref >60)
Glucose, Bld: 152 mg/dL — ABNORMAL HIGH (ref 70–99)
Potassium: 3.4 mmol/L — ABNORMAL LOW (ref 3.5–5.1)
Sodium: 137 mmol/L (ref 135–145)
Total Bilirubin: 0.3 mg/dL (ref 0.3–1.2)
Total Protein: 7.6 g/dL (ref 6.5–8.1)

## 2022-03-04 LAB — TSH: TSH: 0.016 u[IU]/mL — ABNORMAL LOW (ref 0.350–4.500)

## 2022-03-04 LAB — CBC WITH DIFFERENTIAL/PLATELET
Abs Immature Granulocytes: 0.01 10*3/uL (ref 0.00–0.07)
Basophils Absolute: 0.1 10*3/uL (ref 0.0–0.1)
Basophils Relative: 1 %
Eosinophils Absolute: 0.3 10*3/uL (ref 0.0–0.5)
Eosinophils Relative: 4 %
HCT: 36.8 % (ref 36.0–46.0)
Hemoglobin: 12.4 g/dL (ref 12.0–15.0)
Immature Granulocytes: 0 %
Lymphocytes Relative: 28 %
Lymphs Abs: 2.2 10*3/uL (ref 0.7–4.0)
MCH: 30.2 pg (ref 26.0–34.0)
MCHC: 33.7 g/dL (ref 30.0–36.0)
MCV: 89.8 fL (ref 80.0–100.0)
Monocytes Absolute: 0.9 10*3/uL (ref 0.1–1.0)
Monocytes Relative: 11 %
Neutro Abs: 4.4 10*3/uL (ref 1.7–7.7)
Neutrophils Relative %: 56 %
Platelets: 174 10*3/uL (ref 150–400)
RBC: 4.1 MIL/uL (ref 3.87–5.11)
RDW: 12.4 % (ref 11.5–15.5)
WBC: 7.9 10*3/uL (ref 4.0–10.5)
nRBC: 0 % (ref 0.0–0.2)

## 2022-03-04 MED ORDER — SODIUM CHLORIDE 0.9 % IV SOLN
Freq: Once | INTRAVENOUS | Status: AC
  Filled 2022-03-04: qty 250

## 2022-03-04 MED ORDER — HEPARIN SOD (PORK) LOCK FLUSH 100 UNIT/ML IV SOLN
500.0000 [IU] | Freq: Once | INTRAVENOUS | Status: AC | PRN
  Administered 2022-03-04: 500 [IU]
  Filled 2022-03-04: qty 5

## 2022-03-04 MED ORDER — SODIUM CHLORIDE 0.9 % IV SOLN
1200.0000 mg | Freq: Once | INTRAVENOUS | Status: AC
  Administered 2022-03-04: 1200 mg via INTRAVENOUS
  Filled 2022-03-04: qty 20

## 2022-03-04 NOTE — Assessment & Plan Note (Addendum)
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 Currently s/p adjuvant carbo-taxol x4 [poor candidate for cisplatin].  PD-L1-5%; MARCH 15th, 2023-no evidence of any recurrent disease; stable mediastinal lymph node/previously PET negative; fibrotic/bronchitic changes noted bilateral lower lobes [ex-smoker]; currently on adjuvant Atezolizumab.  # Continue Adjuvant Atezoluzimab. Labs today reviewed;  acceptable for treatment today. March 2023-TSH normal. Discussed that the chance of cure is ~75%.    # Fatigue- ? Will repeat TSH today.   # Hypokalemia: Kdur 3.1 [HCTZ]-overall STABLE- continue Kdur BID; monitor closely.  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 continue-vit D 50,000-STABLE.    # chronic back pain/chronic left LE/Bil PN-history of cervical spine injury-no evidence of metastatic disease.  Continue hydrocodone [for now as per Dr. Doy Hutching. STABLE  # History of diabetes- continue metformin 500 mg BID; [place] 2023-Hb A1c-Hb A1C-5.9]. STABLE.    # Depression?anxiety-chronic; compounded by poor social support.  Refer to psychaitry.   # DISPOSITION:  #  refer to psychaitry- Depression/anxiety. .  # add TSH # proceed with Atezoluzumab # follow up in 3 weeks MD; labs- cbc/cmp-; Atzeoluzimab- Dr.B   Addendum TSH: Low; recommend repeat thyroid profile next visit.  Patient informed.  Cc: Dr.A/Dr.Sparks

## 2022-03-04 NOTE — Progress Notes (Signed)
[facility] NOTE  Patient Care Team: Idelle Crouch, MD as PCP - General (Internal Medicine) Telford Nab, RN as Oncology Nurse Navigator  CHIEF COMPLAINTS/PURPOSE OF CONSULTATION: lung cancer  #  Oncology History Overview Note  # RUL-squamous cell carcinoma [s/p bronchoscopy;Dr.A] 1. Spiculated mass of the posterior right pulmonary apex measuring 3.2 x 2.8 cm, consistent with primary lung malignancy. 2. Enlarged right hilar and pretracheal lymph nodes measuring up to 1.6 x 1.5 cm, suspicious for nodal metastatic disease. 3. Recommend multidisciplinary thoracic referral for consideration of PET-CT metabolic characterization and tissue sampling. 4. Background of very fine centrilobular nodularity throughout the lungs, most concentrated at the apices. Scattered bronchiolar plugging. Findings are most consistent with smoking-related respiratory bronchiolitis. 5. Coarse, nodular contour of the liver in the included upper abdomen, suggestive of cirrhosis. 6. Coronary artery disease.  # COPD-  # DM- diet controlled; chronic back pain- hydrocodone 5 day; chronic left lower extremity weakness.  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 LYMPH NODE, HILAR, EXCISION:  - Lymph node negative for metastatic carcinoma.   B. LYMPH NODE, LEVEL 4, EXCISION:  - Lymph node negative for metastatic carcinoma.   C. LUNG, RIGHT UPPER LOBE, LOBECTOMY:  - Squamous cell carcinoma, 4.5 cm.  - Carcinoma extends through visceral pleura and focally involves  parietal pleura.  - Surgical margins negative for carcinoma.  - One lymph node negative for carcinoma.  - See oncology table and comment.   D. SOFT TISSUE, CHEST WALL, BIOPSY:  - Fibroadipose tissue with  focal inflammation and fibrosis.  - No carcinoma identified.   ONCOLOGY TABLE:  LUNG: Resection  Synchronous Tumors: Not applicable.  Total Number of Primary Tumors: 1  Procedure: Right upper lobectomy with lymph node biopsies and chest wall  biopsy.  Specimen Laterality: Right upper lobe.  Tumor Focality: Unifocal.  Tumor Site: Right upper lobe.  Tumor Size: 4.5 x 4 x 3 cm.  Histologic Type: Squamous cell carcinoma.  Visceral Pleura Invasion: Present.  Direct Invasion of Adjacent Structures: Focal involvement of parietal  pleura.  Lymphovascular Invasion: Not identified.  Margins: All surgical margins negative for carcinoma.  Regional Lymph Nodes:       Number of Lymph Nodes Involved: 0                            Nodal Sites with Tumor: 0       Number of Lymph Nodes Examined: 3                       Nodal Sites Examined: 2  Pathologic Stage Classification (pTNM, AJCC 8th Edition): pT3, pN0  Ancillary Studies: Can be performed if requested.   #Right upper lobe stage IIb-pT3 [not cisplatin candidate]; CarboTaxol x4. April 11th, 2023-Atezo q 3 w x1 year   Malignant neoplasm of upper lobe of right lung ([place])  04/24/2021 Initial Diagnosis   Malignant neoplasm of upper lobe of right lung ([place])   05/02/2021 Cancer Staging   Staging form: Lung, AJCC 8th Edition - Clinical: Stage IB (cT2a, cN0, cM0) - Signed by Cammie Sickle, MD on 05/02/2021 Stage prefix: Initial  diagnosis    07/23/2021 Cancer Staging   Staging form: Lung, AJCC 8th Edition - Pathologic: Stage IIB (pT3, pN0, cM0) - Signed by Cammie Sickle, MD on 07/23/2021 Stage prefix: Initial diagnosis     HISTORY OF PRESENTING ILLNESS: Patient ambulating independently.  Alone.   Trey D Beam [name] 71 y.o.  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  Recent URI s/p symptomatic management- improved with anti-histamines.  Complains of ongoing fatigue.   Mild nausea  no vomiting.  No worsening tingling or numbness.  Chronic back pain.  Breathing is improved.   No headaches.  No chest pain.  Review of Systems  Constitutional:  Positive for malaise/fatigue and weight loss. Negative for chills, diaphoresis and fever.  HENT:  Negative for nosebleeds and sore throat.   Eyes:  Negative for double vision.  Respiratory:  Positive for cough and shortness of breath. Negative for hemoptysis and wheezing.   Cardiovascular:  Negative for chest pain, palpitations, orthopnea and leg swelling.  Gastrointestinal:  Negative for abdominal pain, blood in stool, constipation, diarrhea, heartburn, melena and vomiting.  Genitourinary:  Negative for dysuria, frequency and urgency.  Musculoskeletal:  Positive for back pain, joint pain and neck pain.  Skin: Negative.  Negative for itching and rash.  Neurological:  Negative for tingling, focal weakness and weakness.  Endo/Heme/Allergies:  Does not bruise/bleed easily.  Psychiatric/Behavioral:  Negative for depression. The patient is not nervous/anxious and does not have insomnia.     MEDICAL HISTORY:  Past Medical History:  Diagnosis Date  . Angioedema 12/08/2019  . Anxiety   . Aortic atherosclerosis ([facility])   . Asthma   . CAD (coronary artery disease)    3 vessel  . Cancer ([facility])   . COPD (chronic obstructive pulmonary disease) ([facility]) 12/08/2019  . DDD (degenerative disc disease), lumbar   . Depression   . Diabetes mellitus without complication ([facility])    no meds, diet controlled  . Dyspnea   . GERD (gastroesophageal reflux disease)   . HTN (hypertension) 12/08/2019  . Hyperlipidemia   . Hypertension   . Mass of upper lobe of right lung 04/02/2021   spiculated mass RIGHT posterior pulmonary apex with associated hilar and pretracheal LAD; measured 3.2 x 2.8 cm  . Obesity (BMI 30-39.9)   . T2DM (type 2 diabetes mellitus) ([facility]) 12/08/2019    SURGICAL HISTORY: Past Surgical History:  Procedure Laterality Date  . BACK  SURGERY     lumbar L5-S1 ruptured disc x 3 surgeries  . CERVICAL FUSION  2009  . FINE NEEDLE ASPIRATION  06/23/2021   Procedure: FINE NEEDLE ASPIRATION (FNA) LINEAR;  Surgeon: Garner Nash, DO;  Location: [facility];  Service: Pulmonary;;  . INTERCOSTAL NERVE BLOCK Right 07/09/2021   Procedure: INTERCOSTAL NERVE BLOCK;  Surgeon: Lajuana Matte, MD;  Location: [facility];  Service: Thoracic;  Laterality: Right;  . IR IMAGING GUIDED PORT INSERTION  08/06/2021  . LAPAROSCOPIC TOTAL HYSTERECTOMY  1998   with oophorectomy  . LYMPH NODE DISSECTION Right 07/09/2021   Procedure: LYMPH NODE DISSECTION;  Surgeon: Lajuana Matte, MD;  Location: [facility];  Service: Thoracic;  Laterality: Right;  . TONSILLECTOMY    . VIDEO BRONCHOSCOPY WITH ENDOBRONCHIAL NAVIGATION N/A 04/15/2021   Procedure: VIDEO BRONCHOSCOPY WITH ENDOBRONCHIAL NAVIGATION;  Surgeon: Ottie Glazier, MD;  Location: [facility];  Service: Thoracic;  Laterality: N/A;  . VIDEO BRONCHOSCOPY WITH ENDOBRONCHIAL ULTRASOUND N/A 04/15/2021   Procedure: VIDEO BRONCHOSCOPY WITH ENDOBRONCHIAL ULTRASOUND;  Surgeon: Ottie Glazier, MD;  Location: [facility];  Service: Thoracic;  Laterality: N/A;  . VIDEO BRONCHOSCOPY WITH ENDOBRONCHIAL ULTRASOUND N/A 06/23/2021   Procedure: VIDEO BRONCHOSCOPY WITH ENDOBRONCHIAL ULTRASOUND;  Surgeon: Garner Nash, DO;  Location: [facility];  Service: Pulmonary;  Laterality: N/A;    SOCIAL HISTORY: Social History   Socioeconomic History  . Marital status: Married    Spouse name: Orpah Greek  . Number of children: 3  . Years of education: Not on file  . Highest education level: Not on file  Occupational History  . Not on file  Tobacco Use  . Smoking status: Former    Packs/day: 3.00    Years: 50.00    Pack years: 150.00    Types: Cigarettes    Quit date: 2019    Years since quitting: 4.4  . Smokeless tobacco: Never  Vaping Use  . Vaping Use: Never used  Substance and Sexual Activity  . Alcohol use:  Not Currently  . Drug use: Never  . Sexual activity: Not on file    Comment: Hysterectomy  Other Topics Concern  . Not on file  Social History Narrative   Lives in home; with husband; lives in [place]; never smoking 2019; no alcohol. 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    Social Determinants of Health   Financial Resource Strain: Not on file  Food Insecurity: Not on file  Transportation Needs: Not on file  Physical Activity: Not on file  Stress: Not on file  Social Connections: Not on file  Intimate Partner Violence: Not on file    FAMILY HISTORY: Family History  Problem Relation Age of Onset  . Bone cancer Maternal Uncle     ALLERGIES:  is allergic to ibuprofen and macrobid [nitrofurantoin].  MEDICATIONS:  Current Outpatient Medications  Medication Sig Dispense Refill  . albuterol (VENTOLIN HFA) 108 (90 Base) MCG/ACT inhaler Inhale 1 puff into the lungs every 6 (six) hours as needed for wheezing.    Marland Kitchen aspirin EC 81 MG tablet Take 81 mg by mouth at bedtime.    . clonazePAM (KLONOPIN) 0.5 MG tablet Take 0.5 mg by mouth in the morning, at noon, and at bedtime.    . docusate sodium (COLACE) 100 MG capsule Take 200 mg by mouth at bedtime.    Marland Kitchen EPINEPHrine 0.3 mg/0.3 mL IJ SOAJ injection Inject 0.3 mLs (0.3 mg total) into the muscle as needed for anaphylaxis. 2 each 0  . ergocalciferol (VITAMIN D2) 1.25 MG (50000 UT) capsule Take 1 capsule (50,000 Units total) by mouth once a week. 12 capsule 1  . fluticasone-salmeterol (ADVAIR) 250-50 MCG/ACT AEPB Inhale 1 puff into the lungs in the morning and at bedtime.    . gabapentin (NEURONTIN) 300 MG capsule Take 300 mg by mouth 3 (three) times daily. Take 300 mg qam and lunch and take 600 mg qhs    . hydrochlorothiazide (HYDRODIURIL) 25 MG tablet Take 25 mg by mouth daily.    Marland Kitchen HYDROcodone-acetaminophen (NORCO) 10-325 MG tablet Take 1 tablet by mouth every 6 (six) hours as needed.    Marland Kitchen ipratropium-albuterol  (DUONEB) 0.5-2.5 (3) MG/3ML SOLN Take 3 mLs by nebulization 3 (three) times daily.    . Melatonin 10 MG TABS Take 10 mg by mouth at bedtime.    . metFORMIN (GLUCOPHAGE) 500 MG tablet Take 1 tablet (500 mg total) by mouth 2 (two) times daily with a meal. 60 tablet 3  . Multiple Vitamin (MULTI-VITAMIN) tablet Take 1 tablet by mouth  at bedtime.    Marland Kitchen nystatin cream (MYCOSTATIN) Apply 1 application topically 2 (two) times daily as needed for dry skin (around lips).    . pantoprazole (PROTONIX) 40 MG tablet Take 40 mg by mouth at bedtime.    . potassium chloride SA (KLOR-CON) 20 MEQ tablet Take 20 mEq by mouth 2 (two) times daily.    . pravastatin (PRAVACHOL) 80 MG tablet Take 80 mg by mouth at bedtime.    . prochlorperazine (COMPAZINE) 10 MG tablet Take 1 tablet (10 mg total) by mouth every 6 (six) hours as needed (Nausea or vomiting). 30 tablet 1  . tiZANidine (ZANAFLEX) 4 MG tablet Take 4 mg by mouth 3 (three) times daily.    . traZODone (DESYREL) 100 MG tablet Take 50 mg by mouth at bedtime.    Marland Kitchen venlafaxine XR (EFFEXOR-XR) 75 MG 24 hr capsule Take 150 mg by mouth at bedtime.     No current facility-administered medications for this visit.      Marland Kitchen  PHYSICAL EXAMINATION: ECOG PERFORMANCE STATUS: 1 - Symptomatic but completely ambulatory  Vitals:   03/04/22 0926  BP: 106/74  Pulse: 86  Temp: 98.8 F (37.1 C)  SpO2: 96%   Filed Weights    Physical Exam Vitals and nursing note reviewed.  HENT:     Head: Normocephalic and atraumatic.     Mouth/Throat:     Mouth: Mucous membranes are moist.     Pharynx: Oropharynx is clear. No oropharyngeal exudate.  Eyes:     Extraocular Movements: Extraocular movements intact.     Pupils: Pupils are equal, round, and reactive to light.  Cardiovascular:     Rate and Rhythm: Normal rate and regular rhythm.  Pulmonary:     Effort: No respiratory distress.     Breath sounds: No wheezing.     Comments: Decreased breath sounds bilaterally at bases.   No wheeze or crackles Abdominal:     General: Bowel sounds are normal. There is no distension.     Palpations: Abdomen is soft. There is no mass.     Tenderness: There is no abdominal tenderness. There is no guarding or rebound.  Musculoskeletal:        General: No tenderness. Normal range of motion.     Cervical back: Normal range of motion and neck supple.  Skin:    General: Skin is warm.  Neurological:     General: No focal deficit present.     Mental Status: She is alert and oriented to person, place, and time.     Comments: 4-5 strength in the left lower extremity/chronic.  Psychiatric:        Mood and Affect: Affect normal.        Behavior: Behavior normal.        Judgment: Judgment normal.     LABORATORY DATA:  I have reviewed the data as listed Lab Results  Component Value Date   WBC 7.9 03/04/2022   HGB 12.4 03/04/2022   HCT 36.8 03/04/2022   MCV 89.8 03/04/2022   PLT 174 03/04/2022   Recent Labs    01/12/22 1025 02/03/22 0903 03/04/22 0912  NA 139 138 137  K 3.3* 3.1* 3.4*  CL 99 98 99  CO2 29 28 29   GLUCOSE 164* 148* 152*  BUN 14 13 12   CREATININE 0.56 0.66 0.71  CALCIUM 9.4 9.4 9.1  GFRNONAA >60 >60 >60  PROT 7.8 7.8 7.6  ALBUMIN 4.0 4.0 3.7  AST 35 36 38  ALT 21 22 25   ALKPHOS 61 55 53  BILITOT 0.5 0.5 0.3    RADIOGRAPHIC STUDIES: I have personally reviewed the radiological images as listed and agreed with the findings in the report. No results found.  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 Currently s/p adjuvant carbo-taxol x4 [poor candidate for cisplatin].  PD-L1-5%; MARCH 15th, 2023-no evidence of any recurrent disease; stable mediastinal lymph node/previously PET negative; fibrotic/bronchitic changes noted bilateral lower lobes [ex-smoker]; currently on adjuvant Atezolizumab.  # Continue Adjuvant Atezoluzimab. Labs today  reviewed;  acceptable for treatment today. March 2023-TSH normal. Discussed that the chance of cure is ~75%.    # Fatigue- ? Will repeat TSH today.   # Hypokalemia: Kdur 3.1 [HCTZ]-overall STABLE- continue Kdur BID; monitor closely.  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 continue-vit D 50,000-STABLE.    # chronic back pain/chronic left LE/Bil PN-history of cervical spine injury-no evidence of metastatic disease.  Continue hydrocodone [for now as per Dr. Doy Hutching. STABLE  # History of diabetes- continue metformin 500 mg BID; [place] 2023-Hb A1c-Hb A1C-5.9]. STABLE.    # Depression?anxiety-chronic; compounded by poor social support.  Refer to psychaitry.   # DISPOSITION:  #  refer to psychaitry- Depression/anxiety. .  # add TSH # proceed with Atezoluzumab # follow up in 3 weeks MD; labs- cbc/cmp-; Atzeoluzimab- Dr.B   Addendum TSH: Low; recommend repeat thyroid profile next visit.  Patient informed.  Cc: Dr.A/Dr.Sparks        All questions were answered. The patient knows to call the clinic with any problems, questions or concerns.       Cammie Sickle, MD 03/04/2022 12:58 PM

## 2022-03-04 NOTE — Progress Notes (Signed)
Pt would like to know when she can say she is cancer free or should she say in remission.   C/o fatigue.

## 2022-03-04 NOTE — Patient Instructions (Signed)
[facility] [place]  Discharge Instructions: Thank you for choosing [facility] to provide your oncology and hematology care.  If you have a lab appointment with the [facility], please go directly to the [facility] and check in at the registration area.  Wear comfortable clothing and clothing appropriate for easy access to any Portacath or PICC line.   We strive to give you quality time with your provider. You may need to reschedule your appointment if you arrive late (15 or more minutes).  Arriving late affects you and other patients whose appointments are after yours.  Also, if you miss three or more appointments without notifying the office, you may be dismissed from the clinic at the provider's discretion.      For prescription refill requests, have your pharmacy contact our office and allow 72 hours for refills to be completed.    Today you received the following chemotherapy and/or immunotherapy agents      To help prevent nausea and vomiting after your treatment, we encourage you to take your nausea medication as directed.  BELOW ARE SYMPTOMS THAT SHOULD BE REPORTED IMMEDIATELY: *FEVER GREATER THAN 100.4 F (38 C) OR HIGHER *CHILLS OR SWEATING *NAUSEA AND VOMITING THAT IS NOT CONTROLLED WITH YOUR NAUSEA MEDICATION *UNUSUAL SHORTNESS OF BREATH *UNUSUAL BRUISING OR BLEEDING *URINARY PROBLEMS (pain or burning when urinating, or frequent urination) *BOWEL PROBLEMS (unusual diarrhea, constipation, pain near the anus) TENDERNESS IN MOUTH AND THROAT WITH OR WITHOUT PRESENCE OF ULCERS (sore throat, sores in mouth, or a toothache) UNUSUAL RASH, SWELLING OR PAIN  UNUSUAL VAGINAL DISCHARGE OR ITCHING   Items with * indicate a potential emergency and should be followed up as soon as possible or go to the Emergency Department if any problems should occur.  Please show the CHEMOTHERAPY ALERT CARD or IMMUNOTHERAPY ALERT CARD at check-in to the  Emergency Department and triage nurse.  Should you have questions after your visit or need to cancel or reschedule your appointment, please contact [facility] [place] [facility] [place]  [phone] and follow the prompts.  Office hours are 8:00 a.m. to 4:30 p.m. Monday - Friday. Please note that voicemails left after 4:00 p.m. may not be returned until the following business day.  We are closed weekends and major holidays. You have access to a nurse at all times for urgent questions. Please call the main number to the clinic [phone] and follow the prompts.  For any non-urgent questions, you may also contact your provider using MyChart. We now offer e-Visits for anyone 61 and older to request care online for non-urgent symptoms. For details visit [email or web address].   Also download the MyChart app! Go to the app store, search "MyChart", open the app, select [facility], and log in with your MyChart username and password.  Due to Covid, a mask is required upon entering the hospital/clinic. If you do not have a mask, one will be given to you upon arrival. For doctor visits, patients may have 1 support person aged 16 or older with them. For treatment visits, patients cannot have anyone with them due to current Covid guidelines and our immunocompromised population.

## 2022-03-16 ENCOUNTER — Other Ambulatory Visit: Payer: Self-pay | Admitting: *Deleted

## 2022-03-16 MED ORDER — ERGOCALCIFEROL 1.25 MG (50000 UT) PO CAPS: 50000.0000 [IU] | ORAL_CAPSULE | ORAL | 1 refills | Status: DC

## 2022-03-25 ENCOUNTER — Inpatient Hospital Stay (HOSPITAL_BASED_OUTPATIENT_CLINIC_OR_DEPARTMENT_OTHER): Payer: Medicare HMO | Admitting: Internal Medicine

## 2022-03-25 ENCOUNTER — Inpatient Hospital Stay: Payer: Medicare HMO

## 2022-03-25 ENCOUNTER — Encounter: Payer: Self-pay | Admitting: Internal Medicine

## 2022-03-25 DIAGNOSIS — M549 Dorsalgia, unspecified: Secondary | ICD-10-CM | POA: Diagnosis not present

## 2022-03-25 DIAGNOSIS — E119 Type 2 diabetes mellitus without complications: Secondary | ICD-10-CM | POA: Diagnosis not present

## 2022-03-25 DIAGNOSIS — C3411 Malignant neoplasm of upper lobe, right bronchus or lung: Secondary | ICD-10-CM

## 2022-03-25 DIAGNOSIS — G620 Drug-induced polyneuropathy: Secondary | ICD-10-CM | POA: Diagnosis not present

## 2022-03-25 DIAGNOSIS — D491 Neoplasm of unspecified behavior of respiratory system: Secondary | ICD-10-CM

## 2022-03-25 DIAGNOSIS — Z5112 Encounter for antineoplastic immunotherapy: Secondary | ICD-10-CM | POA: Diagnosis not present

## 2022-03-25 DIAGNOSIS — J449 Chronic obstructive pulmonary disease, unspecified: Secondary | ICD-10-CM | POA: Diagnosis not present

## 2022-03-25 DIAGNOSIS — F419 Anxiety disorder, unspecified: Secondary | ICD-10-CM | POA: Diagnosis not present

## 2022-03-25 DIAGNOSIS — E876 Hypokalemia: Secondary | ICD-10-CM | POA: Diagnosis not present

## 2022-03-25 DIAGNOSIS — G8929 Other chronic pain: Secondary | ICD-10-CM | POA: Diagnosis not present

## 2022-03-25 LAB — CBC WITH DIFFERENTIAL/PLATELET
Abs Immature Granulocytes: 0.02 10*3/uL (ref 0.00–0.07)
Basophils Absolute: 0.1 10*3/uL (ref 0.0–0.1)
Basophils Relative: 1 %
Eosinophils Absolute: 0.5 10*3/uL (ref 0.0–0.5)
Eosinophils Relative: 6 %
HCT: 36.8 % (ref 36.0–46.0)
Hemoglobin: 12.5 g/dL (ref 12.0–15.0)
Immature Granulocytes: 0 %
Lymphocytes Relative: 36 %
Lymphs Abs: 2.8 10*3/uL (ref 0.7–4.0)
MCH: 30 pg (ref 26.0–34.0)
MCHC: 34 g/dL (ref 30.0–36.0)
MCV: 88.2 fL (ref 80.0–100.0)
Monocytes Absolute: 0.8 10*3/uL (ref 0.1–1.0)
Monocytes Relative: 10 %
Neutro Abs: 3.6 10*3/uL (ref 1.7–7.7)
Neutrophils Relative %: 47 %
Platelets: 177 10*3/uL (ref 150–400)
RBC: 4.17 MIL/uL (ref 3.87–5.11)
RDW: 12.3 % (ref 11.5–15.5)
WBC: 7.8 10*3/uL (ref 4.0–10.5)
nRBC: 0 % (ref 0.0–0.2)

## 2022-03-25 LAB — COMPREHENSIVE METABOLIC PANEL
ALT: 19 U/L (ref 0–44)
AST: 30 U/L (ref 15–41)
Albumin: 3.7 g/dL (ref 3.5–5.0)
Alkaline Phosphatase: 57 U/L (ref 38–126)
Anion gap: 11 (ref 5–15)
BUN: 10 mg/dL (ref 8–23)
CO2: 27 mmol/L (ref 22–32)
Calcium: 9 mg/dL (ref 8.9–10.3)
Chloride: 99 mmol/L (ref 98–111)
Creatinine, Ser: 0.62 mg/dL (ref 0.44–1.00)
GFR, Estimated: >60 mL/min (ref >60)
Glucose, Bld: 176 mg/dL — ABNORMAL HIGH (ref 70–99)
Potassium: 3 mmol/L — ABNORMAL LOW (ref 3.5–5.1)
Sodium: 137 mmol/L (ref 135–145)
Total Bilirubin: 0.3 mg/dL (ref 0.3–1.2)
Total Protein: 7.5 g/dL (ref 6.5–8.1)

## 2022-03-25 MED ORDER — SODIUM CHLORIDE 0.9 % IV SOLN
Freq: Once | INTRAVENOUS | Status: AC
  Filled 2022-03-25: qty 250

## 2022-03-25 MED ORDER — SODIUM CHLORIDE 0.9 % IV SOLN
1200.0000 mg | Freq: Once | INTRAVENOUS | Status: AC
  Administered 2022-03-25: 1200 mg via INTRAVENOUS
  Filled 2022-03-25: qty 20

## 2022-03-25 MED ORDER — HEPARIN SOD (PORK) LOCK FLUSH 100 UNIT/ML IV SOLN
500.0000 [IU] | Freq: Once | INTRAVENOUS | Status: AC | PRN
  Administered 2022-03-25: 500 [IU]
  Filled 2022-03-25: qty 5

## 2022-03-25 NOTE — Progress Notes (Unsigned)
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 Spiculated mass of the posterior right pulmonary apex measuring 3.2 x 2.8 cm, consistent with primary lung malignancy. 2. Enlarged right hilar and pretracheal lymph nodes measuring up to 1.6 x 1.5 cm, suspicious for nodal metastatic disease. 3. Recommend multidisciplinary thoracic referral for consideration of PET-CT metabolic characterization and tissue sampling. 4. Background of very fine centrilobular nodularity throughout the lungs, most concentrated at the apices. Scattered bronchiolar plugging. Findings are most consistent with smoking-related respiratory bronchiolitis. 5. Coarse, nodular contour of the liver in the included upper abdomen, suggestive of cirrhosis. 6. Coronary artery disease.  # COPD-  # DM- diet controlled; chronic back pain- hydrocodone 5 day; chronic left lower extremity weakness.  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 LYMPH NODE, HILAR, EXCISION:  - Lymph node negative for metastatic carcinoma.   B. LYMPH NODE, LEVEL 4, EXCISION:  - Lymph node negative for metastatic carcinoma.   C. LUNG, RIGHT UPPER LOBE, LOBECTOMY:  - Squamous cell carcinoma, 4.5 cm.  - Carcinoma extends through visceral pleura and focally involves  parietal pleura.  - Surgical margins negative for carcinoma.  - One lymph node negative for carcinoma.  - See oncology table and comment.   D. SOFT  TISSUE, CHEST WALL, BIOPSY:  - Fibroadipose tissue with focal inflammation and fibrosis.  - No carcinoma identified.   ONCOLOGY TABLE:  LUNG: Resection  Synchronous Tumors: Not applicable.  Total Number of Primary Tumors: 1  Procedure: Right upper lobectomy with lymph node biopsies and chest wall  biopsy.  Specimen Laterality: Right upper lobe.  Tumor Focality: Unifocal.  Tumor Site: Right upper lobe.  Tumor Size: 4.5 x 4 x 3 cm.  Histologic Type: Squamous cell carcinoma.  Visceral Pleura Invasion: Present.  Direct Invasion of Adjacent Structures: Focal involvement of parietal  pleura.  Lymphovascular Invasion: Not identified.  Margins: All surgical margins negative for carcinoma.  Regional Lymph Nodes:       Number of Lymph Nodes Involved: 0                            Nodal Sites with Tumor: 0       Number of Lymph Nodes Examined: 3                       Nodal Sites Examined: 2  Pathologic Stage Classification (pTNM, AJCC 8th Edition): pT3, pN0  Ancillary Studies: Can be performed if requested.   #Right upper lobe stage IIb-pT3 [not cisplatin candidate]; CarboTaxol x4. April 11th, 2023-Atezo q 3 w x1 year   Malignant neoplasm of upper lobe of right lung ([place])  04/24/2021 Initial Diagnosis   Malignant neoplasm of upper lobe of right lung ([place])   05/02/2021 Cancer Staging   Staging form: Lung, AJCC 8th Edition - Clinical: Stage IB (cT2a, cN0, cM0) - Signed by Rogue Bussing,  Elisha Headland, MD on 05/02/2021 Stage prefix: Initial diagnosis   07/23/2021 Cancer Staging   Staging form: Lung, AJCC 8th Edition - Pathologic: Stage IIB (pT3, pN0, cM0) - Signed by Cammie Sickle, MD on 07/23/2021 Stage prefix: Initial diagnosis    HISTORY OF PRESENTING ILLNESS: Patient ambulating independently.  Alone.   Olivia Buchanan [name] 71 y.o.  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  Patient complains of multiple episodes of urination.  Complains  of dizziness on standing.  No falls.  Otherwise no diarrhea.  Mild nausea no vomiting.  No worsening tingling or numbness.  Chronic back pain.  Breathing is improved.   No headaches.  No chest pain.  Review of Systems  Constitutional:  Positive for malaise/fatigue and weight loss. Negative for chills, diaphoresis and fever.  HENT:  Negative for nosebleeds and sore throat.   Eyes:  Negative for double vision.  Respiratory:  Positive for cough and shortness of breath. Negative for hemoptysis and wheezing.   Cardiovascular:  Negative for chest pain, palpitations, orthopnea and leg swelling.  Gastrointestinal:  Negative for abdominal pain, blood in stool, constipation, diarrhea, heartburn, melena and vomiting.  Genitourinary:  Negative for dysuria, frequency and urgency.  Musculoskeletal:  Positive for back pain, joint pain and neck pain.  Skin: Negative.  Negative for itching and rash.  Neurological:  Negative for tingling, focal weakness and weakness.  Endo/Heme/Allergies:  Does not bruise/bleed easily.  Psychiatric/Behavioral:  Negative for depression. The patient is not nervous/anxious and does not have insomnia.      MEDICAL HISTORY:  Past Medical History:  Diagnosis Date   Angioedema 12/08/2019   Anxiety    Aortic atherosclerosis (HCC)    Asthma    CAD (coronary artery disease)    3 vessel   Cancer (HCC)    COPD (chronic obstructive pulmonary disease) ([place]) 12/08/2019   DDD (degenerative disc disease), lumbar    Depression    Diabetes mellitus without complication (HCC)    no meds, diet controlled   Dyspnea    GERD (gastroesophageal reflux disease)    HTN (hypertension) 12/08/2019   Hyperlipidemia    Hypertension    Mass of upper lobe of right lung 04/02/2021   spiculated mass RIGHT posterior pulmonary apex with associated hilar and pretracheal LAD; measured 3.2 x 2.8 cm   Obesity (BMI 30-39.9)    T2DM (type 2 diabetes mellitus) ([facility]) 12/08/2019    SURGICAL  HISTORY: Past Surgical History:  Procedure Laterality Date   BACK SURGERY     lumbar L5-S1 ruptured disc x 3 surgeries   CERVICAL FUSION  2009   FINE NEEDLE ASPIRATION  06/23/2021   Procedure: FINE NEEDLE ASPIRATION (FNA) LINEAR;  Surgeon: Garner Nash, DO;  Location: [facility];  Service: Pulmonary;;   INTERCOSTAL NERVE BLOCK Right 07/09/2021   Procedure: INTERCOSTAL NERVE BLOCK;  Surgeon: Lajuana Matte, MD;  Location: [facility];  Service: Thoracic;  Laterality: Right;   IR IMAGING GUIDED PORT INSERTION  08/06/2021   LAPAROSCOPIC TOTAL HYSTERECTOMY  1998   with oophorectomy   LYMPH NODE DISSECTION Right 07/09/2021   Procedure: LYMPH NODE DISSECTION;  Surgeon: Lajuana Matte, MD;  Location: [facility];  Service: Thoracic;  Laterality: Right;   TONSILLECTOMY     VIDEO BRONCHOSCOPY WITH ENDOBRONCHIAL NAVIGATION N/A 04/15/2021   Procedure: VIDEO BRONCHOSCOPY WITH ENDOBRONCHIAL NAVIGATION;  Surgeon: Ottie Glazier, MD;  Location: [facility];  Service: Thoracic;  Laterality: N/A;   VIDEO BRONCHOSCOPY WITH  ENDOBRONCHIAL ULTRASOUND N/A 04/15/2021   Procedure: VIDEO BRONCHOSCOPY WITH ENDOBRONCHIAL ULTRASOUND;  Surgeon: Ottie Glazier, MD;  Location: [facility];  Service: Thoracic;  Laterality: N/A;   VIDEO BRONCHOSCOPY WITH ENDOBRONCHIAL ULTRASOUND N/A 06/23/2021   Procedure: VIDEO BRONCHOSCOPY WITH ENDOBRONCHIAL ULTRASOUND;  Surgeon: Garner Nash, DO;  Location: [facility];  Service: Pulmonary;  Laterality: N/A;    SOCIAL HISTORY: Social History   Socioeconomic History   Marital status: Married    Spouse name: Dwight   Number of children: 3   Years of education: Not on file   Highest education level: Not on file  Occupational History   Not on file  Tobacco Use   Smoking status: Former    Packs/day: 3.00    Years: 50.00    Total pack years: 150.00    Types: Cigarettes    Quit date: 2019    Years since quitting: 4.4   Smokeless tobacco: Never  Vaping Use   Vaping Use: Never  used  Substance and Sexual Activity   Alcohol use: Not Currently   Drug use: Never   Sexual activity: Not on file    Comment: Hysterectomy  Other Topics Concern   Not on file  Social History Narrative   Lives in home; with husband; lives in [place]; never smoking 2019; no alcohol. 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    Social Determinants of Health   Financial Resource Strain: Not on file  Food Insecurity: Not on file  Transportation Needs: Not on file  Physical Activity: Not on file  Stress: Not on file  Social Connections: Not on file  Intimate Partner Violence: Not on file    FAMILY HISTORY: Family History  Problem Relation Age of Onset   Bone cancer Maternal Uncle     ALLERGIES:  is allergic to ibuprofen and macrobid [nitrofurantoin].  MEDICATIONS:  Current Outpatient Medications  Medication Sig Dispense Refill   albuterol (VENTOLIN HFA) 108 (90 Base) MCG/ACT inhaler Inhale 1 puff into the lungs every 6 (six) hours as needed for wheezing.     aspirin EC 81 MG tablet Take 81 mg by mouth at bedtime.     clonazePAM (KLONOPIN) 0.5 MG tablet Take 0.5 mg by mouth in the morning, at noon, and at bedtime.     docusate sodium (COLACE) 100 MG capsule Take 200 mg by mouth at bedtime.     EPINEPHrine 0.3 mg/0.3 mL IJ SOAJ injection Inject 0.3 mLs (0.3 mg total) into the muscle as needed for anaphylaxis. 2 each 0   ergocalciferol (VITAMIN D2) 1.25 MG (50000 UT) capsule Take 1 capsule (50,000 Units total) by mouth once a week. 12 capsule 1   fluticasone-salmeterol (ADVAIR) 250-50 MCG/ACT AEPB Inhale 1 puff into the lungs in the morning and at bedtime.     gabapentin (NEURONTIN) 300 MG capsule Take 300 mg by mouth 3 (three) times daily. Take 300 mg qam and lunch and take 600 mg qhs     hydrochlorothiazide (HYDRODIURIL) 25 MG tablet Take 25 mg by mouth daily.     HYDROcodone-acetaminophen (NORCO) 10-325 MG tablet Take 1 tablet by mouth every 6 (six) hours as  needed.     ipratropium-albuterol (DUONEB) 0.5-2.5 (3) MG/3ML SOLN Take 3 mLs by nebulization 3 (three) times daily.     Melatonin 10 MG TABS Take 10 mg by mouth at bedtime.     metFORMIN (GLUCOPHAGE) 500 MG tablet Take 1 tablet (500 mg total) by mouth 2 (two) times daily with a meal.  60 tablet 3   Multiple Vitamin (MULTI-VITAMIN) tablet Take 1 tablet by mouth at bedtime.     nystatin cream (MYCOSTATIN) Apply 1 application topically 2 (two) times daily as needed for dry skin (around lips).     pantoprazole (PROTONIX) 40 MG tablet Take 40 mg by mouth at bedtime.     potassium chloride SA (KLOR-CON) 20 MEQ tablet Take 20 mEq by mouth 2 (two) times daily.     pravastatin (PRAVACHOL) 80 MG tablet Take 80 mg by mouth at bedtime.     prochlorperazine (COMPAZINE) 10 MG tablet Take 1 tablet (10 mg total) by mouth every 6 (six) hours as needed (Nausea or vomiting). 30 tablet 1   tiZANidine (ZANAFLEX) 4 MG tablet Take 4 mg by mouth 3 (three) times daily.     traZODone (DESYREL) 100 MG tablet Take 50 mg by mouth at bedtime.     venlafaxine XR (EFFEXOR-XR) 75 MG 24 hr capsule Take 150 mg by mouth at bedtime.     No current facility-administered medications for this visit.      Marland Kitchen  PHYSICAL EXAMINATION: ECOG PERFORMANCE STATUS: 1 - Symptomatic but completely ambulatory  Vitals:   03/25/22 0949 03/25/22 0950  BP: (!) 101/52 (!) 98/55  Pulse: 77 79  Resp:    Temp:     Filed Weights   03/25/22 0900  Weight: 173 lb (78.5 kg)    Physical Exam Vitals and nursing note reviewed.  HENT:     Head: Normocephalic and atraumatic.     Mouth/Throat:     Mouth: Mucous membranes are moist.     Pharynx: Oropharynx is clear. No oropharyngeal exudate.  Eyes:     Extraocular Movements: Extraocular movements intact.     Pupils: Pupils are equal, round, and reactive to light.  Cardiovascular:     Rate and Rhythm: Normal rate and regular rhythm.  Pulmonary:     Effort: No respiratory distress.     Breath  sounds: No wheezing.     Comments: Decreased breath sounds bilaterally at bases.  No wheeze or crackles Abdominal:     General: Bowel sounds are normal. There is no distension.     Palpations: Abdomen is soft. There is no mass.     Tenderness: There is no abdominal tenderness. There is no guarding or rebound.  Musculoskeletal:        General: No tenderness. Normal range of motion.     Cervical back: Normal range of motion and neck supple.  Skin:    General: Skin is warm.  Neurological:     General: No focal deficit present.     Mental Status: She is alert and oriented to person, place, and time.     Comments: 4-5 strength in the left lower extremity/chronic.  Psychiatric:        Mood and Affect: Affect normal.        Behavior: Behavior normal.        Judgment: Judgment normal.      LABORATORY DATA:  I have reviewed the data as listed Lab Results  Component Value Date   WBC 7.8 03/25/2022   HGB 12.5 03/25/2022   HCT 36.8 03/25/2022   MCV 88.2 03/25/2022   PLT 177 03/25/2022   Recent Labs    02/03/22 0903 03/04/22 0912 03/25/22 0910  NA 138 137 137  K 3.1* 3.4* 3.0*  CL 98 99 99  CO2 $Re'28 29 27  'XCa$ GLUCOSE 148* 152* 176*  BUN 13 12 10  CREATININE 0.66 0.71 0.62  CALCIUM 9.4 9.1 9.0  GFRNONAA >60 >60 >60  PROT 7.8 7.6 7.5  ALBUMIN 4.0 3.7 3.7  AST 36 38 30  ALT $Re'22 25 19  'MGg$ ALKPHOS 55 53 57  BILITOT 0.5 0.3 0.3    RADIOGRAPHIC STUDIES: I have personally reviewed the radiological images as listed and agreed with the findings in the report. No results found.  ASSESSMENT & PLAN:   Malignant neoplasm of upper lobe of right lung ([place]) # pT3N0 [4.5cm; Squamous cell ca- + involvement of the parietal pleura]; stage II. Currently s/p adjuvant carbo-taxol x4 [poor candidate for cisplatin].  PD-L1-5%; MARCH 15th, 2023-no evidence of any recurrent disease; stable mediastinal lymph node/previously PET negative; fibrotic/bronchitic changes noted bilateral lower lobes [ex-smoker];  currently on adjuvant Atezolizumab. STABLE.   # Continue Adjuvant Atezoluzimab. Labs today reviewed;  acceptable for treatment today.    # Fatigue/dizzyness; mildly orthostatic- Recommend HOLDING HCTZ.   # Hypokalemia: Kdur 3.0 [HCTZ]-overall STABLE- continue Kdur BID; See above.   # TSH: ? Sec to Keytruda- Low; repeat thyroid panel pending today. Will inform Dr.Sparks.   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 continue-vit D 50,000-STABLE.    # chronic back pain/chronic left LE/Bil PN-history of cervical spine injury-no evidence of metastatic disease.  Continue hydrocodone [for now as per Dr. Doy Hutching. STABLE.    # History of diabetes- continue metformin 500 mg BID; [place] 2023-Hb A1c-Hb A1C-5.9]. STABLE.    # Depression?anxiety-chronic; compounded by poor social support.  Refer to psychaitry AGAIN. .   # DISPOSITION:  #  refer to psychaitry- Depression/anxiety- please check second time.  # proceed with Atezoluzumab # follow up in 3 weeks MD; labs- cbc/cmp-; Atzeoluzimab- Dr.B  .  Cc: Dr.A/Dr.Sparks        All questions were answered. The patient knows to call the clinic with any problems, questions or concerns.       Cammie Sickle, MD 03/25/2022 9:57 AM

## 2022-03-25 NOTE — Assessment & Plan Note (Addendum)
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 Currently s/p adjuvant carbo-taxol x4 [poor candidate for cisplatin].  PD-L1-5%; MARCH 15th, 2023-no evidence of any recurrent disease; stable mediastinal lymph node/previously PET negative; fibrotic/bronchitic changes noted bilateral lower lobes [ex-smoker]; currently on adjuvant Atezolizumab. STABLE.   # Continue Adjuvant Atezoluzimab. Labs today reviewed;  acceptable for treatment today.    # Fatigue/dizzyness; mildly orthostatic- Recommend HOLDING HCTZ.   # Hypokalemia: Kdur 3.0 [HCTZ]-overall STABLE- continue Kdur BID; See above.   # TSH: ? Sec to Keytruda- Low; repeat thyroid panel pending today. Will inform Dr.Sparks.   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 continue-vit D 50,000-STABLE.    # chronic back pain/chronic left LE/Bil PN-history of cervical spine injury-no evidence of metastatic disease.  Continue hydrocodone [for now as per Dr. Doy Hutching. STABLE.    # History of diabetes- continue metformin 500 mg BID; [place] 2023-Hb A1c-Hb A1C-5.9]. STABLE.    # Depression?anxiety-chronic; compounded by poor social support.  Refer to psychaitry AGAIN. .   # DISPOSITION:  #  refer to psychaitry- Depression/anxiety- please check second time.  # proceed with Atezoluzumab # follow up in 3 weeks MD; labs- cbc/cmp-; Atzeoluzimab- Dr.B  .  Cc: Dr.A/Dr.Sparks

## 2022-03-25 NOTE — Patient Instructions (Signed)
[facility] AT [place]  Discharge Instructions: Thank you for choosing [facility] to provide your oncology and hematology care.  If you have a lab appointment with the [facility], please go directly to the [facility] and check in at the registration area.  Wear comfortable clothing and clothing appropriate for easy access to any Portacath or PICC line.   We strive to give you quality time with your provider. You may need to reschedule your appointment if you arrive late (15 or more minutes).  Arriving late affects you and other patients whose appointments are after yours.  Also, if you miss three or more appointments without notifying the office, you may be dismissed from the clinic at the provider's discretion.      For prescription refill requests, have your pharmacy contact our office and allow 72 hours for refills to be completed.    Today you received the following chemotherapy and/or immunotherapy agents TECENTRIQ      To help prevent nausea and vomiting after your treatment, we encourage you to take your nausea medication as directed.  BELOW ARE SYMPTOMS THAT SHOULD BE REPORTED IMMEDIATELY: *FEVER GREATER THAN 100.4 F (38 C) OR HIGHER *CHILLS OR SWEATING *NAUSEA AND VOMITING THAT IS NOT CONTROLLED WITH YOUR NAUSEA MEDICATION *UNUSUAL SHORTNESS OF BREATH *UNUSUAL BRUISING OR BLEEDING *URINARY PROBLEMS (pain or burning when urinating, or frequent urination) *BOWEL PROBLEMS (unusual diarrhea, constipation, pain near the anus) TENDERNESS IN MOUTH AND THROAT WITH OR WITHOUT PRESENCE OF ULCERS (sore throat, sores in mouth, or a toothache) UNUSUAL RASH, SWELLING OR PAIN  UNUSUAL VAGINAL DISCHARGE OR ITCHING   Items with * indicate a potential emergency and should be followed up as soon as possible or go to the Emergency Department if any problems should occur.  Please show the CHEMOTHERAPY ALERT CARD or IMMUNOTHERAPY ALERT CARD at check-in to  the Emergency Department and triage nurse.  Should you have questions after your visit or need to cancel or reschedule your appointment, please contact [facility] [place]  [phone] and follow the prompts.  Office hours are 8:00 a.m. to 4:30 p.m. Monday - Friday. Please note that voicemails left after 4:00 p.m. may not be returned until the following business day.  We are closed weekends and major holidays. You have access to a nurse at all times for urgent questions. Please call the main number to the clinic [phone] and follow the prompts.  For any non-urgent questions, you may also contact your provider using MyChart. We now offer e-Visits for anyone 43 and older to request care online for non-urgent symptoms. For details visit [email or web address].   Also download the MyChart app! Go to the app store, search "MyChart", open the app, select [facility], and log in with your MyChart username and password.  Masks are optional in the cancer centers. If you would like for your care team to wear a mask while they are taking care of you, please let them know. For doctor visits, patients may have with them one support person who is at least 69 years old. At this time, visitors are not allowed in the infusion area. Atezolizumab injection What is this medication? ATEZOLIZUMAB (a te zoe LIZ ue mab) is a monoclonal antibody. It is used to treat bladder cancer (urothelial cancer), liver cancer, lung cancer, and melanoma. This medicine may be used for other purposes; ask your health care provider or pharmacist if you have questions. COMMON BRAND NAME(S): Tecentriq What should  I tell my care team before I take this medication? They need to know if you have any of these conditions: autoimmune diseases like Crohn's disease, ulcerative colitis, or lupus have had or planning to have an allogeneic stem cell transplant (uses someone else's stem cells) history of organ  transplant history of radiation to the chest nervous system problems like myasthenia gravis or Guillain-Barre syndrome an unusual or allergic reaction to atezolizumab, other medicines, foods, dyes, or preservatives pregnant or trying to get pregnant breast-feeding How should I use this medication? This medicine is for infusion into a vein. It is given by a health care professional in a hospital or clinic setting. A special MedGuide will be given to you before each treatment. Be sure to read this information carefully each time. Talk to your pediatrician regarding the use of this medicine in children. Special care may be needed. Overdosage: If you think you have taken too much of this medicine contact a poison control center or emergency room at once. NOTE: This medicine is only for you. Do not share this medicine with others. What if I miss a dose? It is important not to miss your dose. Call your doctor or health care professional if you are unable to keep an appointment. What may interact with this medication? Interactions have not been studied. This list may not describe all possible interactions. Give your health care provider a list of all the medicines, herbs, non-prescription drugs, or dietary supplements you use. Also tell them if you smoke, drink alcohol, or use illegal drugs. Some items may interact with your medicine. What should I watch for while using this medication? Your condition will be monitored carefully while you are receiving this medicine. You may need blood work done while you are taking this medicine. Do not become pregnant while taking this medicine or for at least 5 months after stopping it. Women should inform their doctor if they wish to become pregnant or think they might be pregnant. There is a potential for serious side effects to an unborn child. Talk to your health care professional or pharmacist for more information. Do not breast-feed an infant while taking this  medicine or for at least 5 months after the last dose. What side effects may I notice from receiving this medication? Side effects that you should report to your doctor or health care professional as soon as possible: allergic reactions like skin rash, itching or hives, swelling of the face, lips, or tongue black, tarry stools bloody or watery diarrhea breathing problems changes in vision chest pain or chest tightness chills facial flushing fever headache signs and symptoms of high blood sugar such as dizziness; dry mouth; dry skin; fruity breath; nausea; stomach pain; increased hunger or thirst; increased urination signs and symptoms of liver injury like dark yellow or brown urine; general ill feeling or flu-like symptoms; light-colored stools; loss of appetite; nausea; right upper belly pain; unusually weak or tired; yellowing of the eyes or skin stomach pain trouble passing urine or change in the amount of urine Side effects that usually do not require medical attention (report to your doctor or health care professional if they continue or are bothersome): bone pain cough diarrhea joint pain muscle pain muscle weakness swelling of arms or legs tiredness weight loss This list may not describe all possible side effects. Call your doctor for medical advice about side effects. You may report side effects to FDA at 1-800-FDA-1088. Where should I keep my medication? This drug is given  in a hospital or clinic and will not be stored at home. NOTE: This sheet is a summary. It may not cover all possible information. If you have questions about this medicine, talk to your doctor, pharmacist, or health care provider.  2023 Elsevier/Gold Standard (2021-08-21 00:00:00)

## 2022-03-25 NOTE — Patient Instructions (Signed)
#  Hold hydrochlorothiazide until further directions.Marland Kitchen

## 2022-03-25 NOTE — Progress Notes (Unsigned)
For the past couple of weeks feels a pulling/stinging sensation at port.    When she stands feels like she is going to pass out and has to sit back down.    Bp in office 106/60 supine; 101/52 sitting; 98/55 standing

## 2022-03-26 ENCOUNTER — Encounter: Payer: Self-pay | Admitting: Internal Medicine

## 2022-03-26 LAB — THYROID PANEL WITH TSH
Free Thyroxine Index: 1.8 (ref 1.2–4.9)
T3 Uptake Ratio: 24 % (ref 24–39)
T4, Total: 7.7 ug/dL (ref 4.5–12.0)
TSH: 0.033 u[IU]/mL — ABNORMAL LOW (ref 0.450–4.500)

## 2022-04-15 ENCOUNTER — Inpatient Hospital Stay: Payer: Medicare HMO

## 2022-04-15 ENCOUNTER — Inpatient Hospital Stay (HOSPITAL_BASED_OUTPATIENT_CLINIC_OR_DEPARTMENT_OTHER): Payer: Medicare HMO | Admitting: Internal Medicine

## 2022-04-15 ENCOUNTER — Encounter: Payer: Self-pay | Admitting: Internal Medicine

## 2022-04-15 ENCOUNTER — Inpatient Hospital Stay: Payer: Medicare HMO | Attending: Internal Medicine

## 2022-04-15 DIAGNOSIS — R350 Frequency of micturition: Secondary | ICD-10-CM | POA: Diagnosis not present

## 2022-04-15 DIAGNOSIS — E119 Type 2 diabetes mellitus without complications: Secondary | ICD-10-CM | POA: Insufficient documentation

## 2022-04-15 DIAGNOSIS — C3411 Malignant neoplasm of upper lobe, right bronchus or lung: Secondary | ICD-10-CM | POA: Insufficient documentation

## 2022-04-15 DIAGNOSIS — I2510 Atherosclerotic heart disease of native coronary artery without angina pectoris: Secondary | ICD-10-CM | POA: Insufficient documentation

## 2022-04-15 DIAGNOSIS — E876 Hypokalemia: Secondary | ICD-10-CM | POA: Insufficient documentation

## 2022-04-15 DIAGNOSIS — D491 Neoplasm of unspecified behavior of respiratory system: Secondary | ICD-10-CM

## 2022-04-15 DIAGNOSIS — J449 Chronic obstructive pulmonary disease, unspecified: Secondary | ICD-10-CM | POA: Diagnosis not present

## 2022-04-15 DIAGNOSIS — R42 Dizziness and giddiness: Secondary | ICD-10-CM | POA: Diagnosis not present

## 2022-04-15 DIAGNOSIS — Z5112 Encounter for antineoplastic immunotherapy: Secondary | ICD-10-CM | POA: Insufficient documentation

## 2022-04-15 LAB — CBC WITH DIFFERENTIAL/PLATELET
Abs Immature Granulocytes: 0.01 10*3/uL (ref 0.00–0.07)
Basophils Absolute: 0.1 10*3/uL (ref 0.0–0.1)
Basophils Relative: 1 %
Eosinophils Absolute: 0.4 10*3/uL (ref 0.0–0.5)
Eosinophils Relative: 6 %
HCT: 36.7 % (ref 36.0–46.0)
Hemoglobin: 12.3 g/dL (ref 12.0–15.0)
Immature Granulocytes: 0 %
Lymphocytes Relative: 40 %
Lymphs Abs: 2.4 10*3/uL (ref 0.7–4.0)
MCH: 30.2 pg (ref 26.0–34.0)
MCHC: 33.5 g/dL (ref 30.0–36.0)
MCV: 90.2 fL (ref 80.0–100.0)
Monocytes Absolute: 0.5 10*3/uL (ref 0.1–1.0)
Monocytes Relative: 8 %
Neutro Abs: 2.7 10*3/uL (ref 1.7–7.7)
Neutrophils Relative %: 45 %
Platelets: 150 10*3/uL (ref 150–400)
RBC: 4.07 MIL/uL (ref 3.87–5.11)
RDW: 12.9 % (ref 11.5–15.5)
WBC: 6 10*3/uL (ref 4.0–10.5)
nRBC: 0 % (ref 0.0–0.2)

## 2022-04-15 LAB — URINALYSIS, COMPLETE (UACMP) WITH MICROSCOPIC
Bacteria, UA: NONE SEEN
Bilirubin Urine: NEGATIVE
Glucose, UA: NEGATIVE mg/dL
Hgb urine dipstick: NEGATIVE
Ketones, ur: 5 mg/dL — AB
Nitrite: NEGATIVE
Protein, ur: NEGATIVE mg/dL
Specific Gravity, Urine: 1.02 (ref 1.005–1.030)
pH: 5 (ref 5.0–8.0)

## 2022-04-15 LAB — COMPREHENSIVE METABOLIC PANEL
ALT: 18 U/L (ref 0–44)
AST: 35 U/L (ref 15–41)
Albumin: 3.6 g/dL (ref 3.5–5.0)
Alkaline Phosphatase: 53 U/L (ref 38–126)
Anion gap: 6 (ref 5–15)
BUN: 11 mg/dL (ref 8–23)
CO2: 25 mmol/L (ref 22–32)
Calcium: 8.7 mg/dL — ABNORMAL LOW (ref 8.9–10.3)
Chloride: 106 mmol/L (ref 98–111)
Creatinine, Ser: 0.7 mg/dL (ref 0.44–1.00)
GFR, Estimated: >60 mL/min (ref >60)
Glucose, Bld: 143 mg/dL — ABNORMAL HIGH (ref 70–99)
Potassium: 3.7 mmol/L (ref 3.5–5.1)
Sodium: 137 mmol/L (ref 135–145)
Total Bilirubin: 0.6 mg/dL (ref 0.3–1.2)
Total Protein: 7 g/dL (ref 6.5–8.1)

## 2022-04-15 MED ORDER — SODIUM CHLORIDE 0.9 % IV SOLN
1200.0000 mg | Freq: Once | INTRAVENOUS | Status: AC
  Administered 2022-04-15: 1200 mg via INTRAVENOUS
  Filled 2022-04-15: qty 20

## 2022-04-15 MED ORDER — SODIUM CHLORIDE 0.9 % IV SOLN
Freq: Once | INTRAVENOUS | Status: AC
  Filled 2022-04-15: qty 250

## 2022-04-15 MED ORDER — HEPARIN SOD (PORK) LOCK FLUSH 100 UNIT/ML IV SOLN
500.0000 [IU] | Freq: Once | INTRAVENOUS | Status: AC | PRN
  Administered 2022-04-15: 500 [IU]
  Filled 2022-04-15: qty 5

## 2022-04-15 MED ORDER — SODIUM CHLORIDE 0.9% FLUSH
10.0000 mL | INTRAVENOUS | Status: DC | PRN
  Administered 2022-04-15: 10 mL
  Filled 2022-04-15: qty 10

## 2022-04-15 NOTE — Patient Instructions (Signed)
[facility] AT [place]  Discharge Instructions: Thank you for choosing [facility] to provide your oncology and hematology care.  If you have a lab appointment with the [facility], please go directly to the [facility] and check in at the registration area.  Wear comfortable clothing and clothing appropriate for easy access to any Portacath or PICC line.   We strive to give you quality time with your provider. You may need to reschedule your appointment if you arrive late (15 or more minutes).  Arriving late affects you and other patients whose appointments are after yours.  Also, if you miss three or more appointments without notifying the office, you may be dismissed from the clinic at the provider's discretion.      For prescription refill requests, have your pharmacy contact our office and allow 72 hours for refills to be completed.    Today you received the following chemotherapy and/or immunotherapy agents tecentriq      To help prevent nausea and vomiting after your treatment, we encourage you to take your nausea medication as directed.  BELOW ARE SYMPTOMS THAT SHOULD BE REPORTED IMMEDIATELY: *FEVER GREATER THAN 100.4 F (38 C) OR HIGHER *CHILLS OR SWEATING *NAUSEA AND VOMITING THAT IS NOT CONTROLLED WITH YOUR NAUSEA MEDICATION *UNUSUAL SHORTNESS OF BREATH *UNUSUAL BRUISING OR BLEEDING *URINARY PROBLEMS (pain or burning when urinating, or frequent urination) *BOWEL PROBLEMS (unusual diarrhea, constipation, pain near the anus) TENDERNESS IN MOUTH AND THROAT WITH OR WITHOUT PRESENCE OF ULCERS (sore throat, sores in mouth, or a toothache) UNUSUAL RASH, SWELLING OR PAIN  UNUSUAL VAGINAL DISCHARGE OR ITCHING   Items with * indicate a potential emergency and should be followed up as soon as possible or go to the Emergency Department if any problems should occur.  Please show the CHEMOTHERAPY ALERT CARD or IMMUNOTHERAPY ALERT CARD at check-in to  the Emergency Department and triage nurse.  Should you have questions after your visit or need to cancel or reschedule your appointment, please contact [facility] [place] AT [place]  [phone] and follow the prompts.  Office hours are 8:00 a.m. to 4:30 p.m. Monday - Friday. Please note that voicemails left after 4:00 p.m. may not be returned until the following business day.  We are closed weekends and major holidays. You have access to a nurse at all times for urgent questions. Please call the main number to the clinic [phone] and follow the prompts.  For any non-urgent questions, you may also contact your provider using MyChart. We now offer e-Visits for anyone 51 and older to request care online for non-urgent symptoms. For details visit [email or web address].   Also download the MyChart app! Go to the app store, search "MyChart", open the app, select [facility], and log in with your MyChart username and password.  Masks are optional in the cancer centers. If you would like for your care team to wear a mask while they are taking care of you, please let them know. For doctor visits, patients may have with them one support person who is at least 69 years old. At this time, visitors are not allowed in the infusion area.  Atezolizumab injection What is this medication? ATEZOLIZUMAB (a te zoe LIZ ue mab) is a monoclonal antibody. It is used to treat bladder cancer (urothelial cancer), liver cancer, lung cancer, and melanoma. This medicine may be used for other purposes; ask your health care provider or pharmacist if you have questions. COMMON BRAND NAME(S): Tecentriq What  should I tell my care team before I take this medication? They need to know if you have any of these conditions: autoimmune diseases like Crohn's disease, ulcerative colitis, or lupus have had or planning to have an allogeneic stem cell transplant (uses someone else's stem cells) history of organ  transplant history of radiation to the chest nervous system problems like myasthenia gravis or Guillain-Barre syndrome an unusual or allergic reaction to atezolizumab, other medicines, foods, dyes, or preservatives pregnant or trying to get pregnant breast-feeding How should I use this medication? This medicine is for infusion into a vein. It is given by a health care professional in a hospital or clinic setting. A special MedGuide will be given to you before each treatment. Be sure to read this information carefully each time. Talk to your pediatrician regarding the use of this medicine in children. Special care may be needed. Overdosage: If you think you have taken too much of this medicine contact a poison control center or emergency room at once. NOTE: This medicine is only for you. Do not share this medicine with others. What if I miss a dose? It is important not to miss your dose. Call your doctor or health care professional if you are unable to keep an appointment. What may interact with this medication? Interactions have not been studied. This list may not describe all possible interactions. Give your health care provider a list of all the medicines, herbs, non-prescription drugs, or dietary supplements you use. Also tell them if you smoke, drink alcohol, or use illegal drugs. Some items may interact with your medicine. What should I watch for while using this medication? Your condition will be monitored carefully while you are receiving this medicine. You may need blood work done while you are taking this medicine. Do not become pregnant while taking this medicine or for at least 5 months after stopping it. Women should inform their doctor if they wish to become pregnant or think they might be pregnant. There is a potential for serious side effects to an unborn child. Talk to your health care professional or pharmacist for more information. Do not breast-feed an infant while taking this  medicine or for at least 5 months after the last dose. What side effects may I notice from receiving this medication? Side effects that you should report to your doctor or health care professional as soon as possible: allergic reactions like skin rash, itching or hives, swelling of the face, lips, or tongue black, tarry stools bloody or watery diarrhea breathing problems changes in vision chest pain or chest tightness chills facial flushing fever headache signs and symptoms of high blood sugar such as dizziness; dry mouth; dry skin; fruity breath; nausea; stomach pain; increased hunger or thirst; increased urination signs and symptoms of liver injury like dark yellow or brown urine; general ill feeling or flu-like symptoms; light-colored stools; loss of appetite; nausea; right upper belly pain; unusually weak or tired; yellowing of the eyes or skin stomach pain trouble passing urine or change in the amount of urine Side effects that usually do not require medical attention (report to your doctor or health care professional if they continue or are bothersome): bone pain cough diarrhea joint pain muscle pain muscle weakness swelling of arms or legs tiredness weight loss This list may not describe all possible side effects. Call your doctor for medical advice about side effects. You may report side effects to FDA at 1-800-FDA-1088. Where should I keep my medication? This drug is  given in a hospital or clinic and will not be stored at home. NOTE: This sheet is a summary. It may not cover all possible information. If you have questions about this medicine, talk to your doctor, pharmacist, or health care provider.  2023 Elsevier/Gold Standard (2021-08-21 00:00:00)

## 2022-04-15 NOTE — Progress Notes (Signed)
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 Spiculated mass of the posterior right pulmonary apex measuring 3.2 x 2.8 cm, consistent with primary lung malignancy. 2. Enlarged right hilar and pretracheal lymph nodes measuring up to 1.6 x 1.5 cm, suspicious for nodal metastatic disease. 3. Recommend multidisciplinary thoracic referral for consideration of PET-CT metabolic characterization and tissue sampling. 4. Background of very fine centrilobular nodularity throughout the lungs, most concentrated at the apices. Scattered bronchiolar plugging. Findings are most consistent with smoking-related respiratory bronchiolitis. 5. Coarse, nodular contour of the liver in the included upper abdomen, suggestive of cirrhosis. 6. Coronary artery disease.  # COPD-  # DM- diet controlled; chronic back pain- hydrocodone 5 day; chronic left lower extremity weakness.  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 LYMPH NODE, HILAR, EXCISION:  - Lymph node negative for metastatic carcinoma.   B. LYMPH NODE, LEVEL 4, EXCISION:  - Lymph node negative for metastatic carcinoma.   C. LUNG, RIGHT UPPER LOBE, LOBECTOMY:  - Squamous cell carcinoma, 4.5 cm.  - Carcinoma extends through visceral pleura and focally involves  parietal pleura.  - Surgical margins negative for carcinoma.  - One lymph node negative for carcinoma.  - See oncology table and comment.   D. SOFT  TISSUE, CHEST WALL, BIOPSY:  - Fibroadipose tissue with focal inflammation and fibrosis.  - No carcinoma identified.   ONCOLOGY TABLE:  LUNG: Resection  Synchronous Tumors: Not applicable.  Total Number of Primary Tumors: 1  Procedure: Right upper lobectomy with lymph node biopsies and chest wall  biopsy.  Specimen Laterality: Right upper lobe.  Tumor Focality: Unifocal.  Tumor Site: Right upper lobe.  Tumor Size: 4.5 x 4 x 3 cm.  Histologic Type: Squamous cell carcinoma.  Visceral Pleura Invasion: Present.  Direct Invasion of Adjacent Structures: Focal involvement of parietal  pleura.  Lymphovascular Invasion: Not identified.  Margins: All surgical margins negative for carcinoma.  Regional Lymph Nodes:       Number of Lymph Nodes Involved: 0                            Nodal Sites with Tumor: 0       Number of Lymph Nodes Examined: 3                       Nodal Sites Examined: 2  Pathologic Stage Classification (pTNM, AJCC 8th Edition): pT3, pN0  Ancillary Studies: Can be performed if requested.   #Right upper lobe stage IIb-pT3 [not cisplatin candidate]; CarboTaxol x4. April 11th, 2023-Atezo q 3 w x1 year   Malignant neoplasm of upper lobe of right lung ([facility])  04/24/2021 Initial Diagnosis   Malignant neoplasm of upper lobe of right lung ([facility])   05/02/2021 Cancer Staging   Staging form: Lung, AJCC 8th Edition - Clinical: Stage IB (cT2a, cN0, cM0) - Signed by Rogue Bussing,  Elisha Headland, MD on 05/02/2021 Stage prefix: Initial diagnosis   07/23/2021 Cancer Staging   Staging form: Lung, AJCC 8th Edition - Pathologic: Stage IIB (pT3, pN0, cM0) - Signed by Cammie Sickle, MD on 07/23/2021 Stage prefix: Initial diagnosis    HISTORY OF PRESENTING ILLNESS: Patient ambulating independently.  Alone.   Olivia Buchanan 71 y.o.  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  Patient complains of multiple episodes of urination.  Complains  of dizziness on standing.  No falls.  Otherwise no diarrhea.  Mild nausea no vomiting.  No worsening tingling or numbness.  Chronic back pain.  Breathing is improved.   No headaches.  No chest pain.  Review of Systems  Constitutional:  Positive for malaise/fatigue and weight loss. Negative for chills, diaphoresis and fever.  HENT:  Negative for nosebleeds and sore throat.   Eyes:  Negative for double vision.  Respiratory:  Positive for cough and shortness of breath. Negative for hemoptysis and wheezing.   Cardiovascular:  Negative for chest pain, palpitations, orthopnea and leg swelling.  Gastrointestinal:  Negative for abdominal pain, blood in stool, constipation, diarrhea, heartburn, melena and vomiting.  Genitourinary:  Negative for dysuria, frequency and urgency.  Musculoskeletal:  Positive for back pain, joint pain and neck pain.  Skin: Negative.  Negative for itching and rash.  Neurological:  Negative for tingling, focal weakness and weakness.  Endo/Heme/Allergies:  Does not bruise/bleed easily.  Psychiatric/Behavioral:  Negative for depression. The patient is not nervous/anxious and does not have insomnia.      MEDICAL HISTORY:  Past Medical History:  Diagnosis Date   Angioedema 12/08/2019   Anxiety    Aortic atherosclerosis (HCC)    Asthma    CAD (coronary artery disease)    3 vessel   Cancer (HCC)    COPD (chronic obstructive pulmonary disease) ([facility]) 12/08/2019   DDD (degenerative disc disease), lumbar    Depression    Diabetes mellitus without complication (HCC)    no meds, diet controlled   Dyspnea    GERD (gastroesophageal reflux disease)    HTN (hypertension) 12/08/2019   Hyperlipidemia    Hypertension    Mass of upper lobe of right lung 04/02/2021   spiculated mass RIGHT posterior pulmonary apex with associated hilar and pretracheal LAD; measured 3.2 x 2.8 cm   Obesity (BMI 30-39.9)    T2DM (type 2 diabetes mellitus) ([facility]) 12/08/2019    SURGICAL  HISTORY: Past Surgical History:  Procedure Laterality Date   BACK SURGERY     lumbar L5-S1 ruptured disc x 3 surgeries   CERVICAL FUSION  2009   FINE NEEDLE ASPIRATION  06/23/2021   Procedure: FINE NEEDLE ASPIRATION (FNA) LINEAR;  Surgeon: Garner Nash, DO;  Location: [facility];  Service: Pulmonary;;   INTERCOSTAL NERVE BLOCK Right 07/09/2021   Procedure: INTERCOSTAL NERVE BLOCK;  Surgeon: Lajuana Matte, MD;  Location: [facility];  Service: Thoracic;  Laterality: Right;   IR IMAGING GUIDED PORT INSERTION  08/06/2021   LAPAROSCOPIC TOTAL HYSTERECTOMY  1998   with oophorectomy   LYMPH NODE DISSECTION Right 07/09/2021   Procedure: LYMPH NODE DISSECTION;  Surgeon: Lajuana Matte, MD;  Location: [facility];  Service: Thoracic;  Laterality: Right;   TONSILLECTOMY     VIDEO BRONCHOSCOPY WITH ENDOBRONCHIAL NAVIGATION N/A 04/15/2021   Procedure: VIDEO BRONCHOSCOPY WITH ENDOBRONCHIAL NAVIGATION;  Surgeon: Ottie Glazier, MD;  Location: [facility];  Service: Thoracic;  Laterality: N/A;   VIDEO BRONCHOSCOPY WITH  ENDOBRONCHIAL ULTRASOUND N/A 04/15/2021   Procedure: VIDEO BRONCHOSCOPY WITH ENDOBRONCHIAL ULTRASOUND;  Surgeon: Ottie Glazier, MD;  Location: [facility];  Service: Thoracic;  Laterality: N/A;   VIDEO BRONCHOSCOPY WITH ENDOBRONCHIAL ULTRASOUND N/A 06/23/2021   Procedure: VIDEO BRONCHOSCOPY WITH ENDOBRONCHIAL ULTRASOUND;  Surgeon: Garner Nash, DO;  Location: [facility];  Service: Pulmonary;  Laterality: N/A;    SOCIAL HISTORY: Social History   Socioeconomic History   Marital status: Married    Spouse name: Dwight   Number of children: 3   Years of education: Not on file   Highest education level: Not on file  Occupational History   Not on file  Tobacco Use   Smoking status: Former    Packs/day: 3.00    Years: 50.00    Total pack years: 150.00    Types: Cigarettes    Quit date: 2019    Years since quitting: 4.5   Smokeless tobacco: Never  Vaping Use   Vaping Use: Never  used  Substance and Sexual Activity   Alcohol use: Not Currently   Drug use: Never   Sexual activity: Not on file    Comment: Hysterectomy  Other Topics Concern   Not on file  Social History Narrative   Lives in home; with husband; lives in [place]; never smoking 2019; no alcohol. 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    Social Determinants of Health   Financial Resource Strain: Not on file  Food Insecurity: Not on file  Transportation Needs: Not on file  Physical Activity: Not on file  Stress: Not on file  Social Connections: Not on file  Intimate Partner Violence: Not on file    FAMILY HISTORY: Family History  Problem Relation Age of Onset   Bone cancer Maternal Uncle     ALLERGIES:  is allergic to ibuprofen and macrobid [nitrofurantoin].  MEDICATIONS:  Current Outpatient Medications  Medication Sig Dispense Refill   albuterol (VENTOLIN HFA) 108 (90 Base) MCG/ACT inhaler Inhale 1 puff into the lungs every 6 (six) hours as needed for wheezing.     aspirin EC 81 MG tablet Take 81 mg by mouth at bedtime.     Biotin 1000 MCG tablet Take 1,000 mcg by mouth 3 (three) times daily.     clonazePAM (KLONOPIN) 0.5 MG tablet Take 0.5 mg by mouth in the morning, at noon, and at bedtime.     diphenoxylate-atropine (LOMOTIL) 2.5-0.025 MG tablet Take by mouth.     EPINEPHrine 0.3 mg/0.3 mL IJ SOAJ injection Inject 0.3 mLs (0.3 mg total) into the muscle as needed for anaphylaxis. 2 each 0   ergocalciferol (VITAMIN D2) 1.25 MG (50000 UT) capsule Take 1 capsule (50,000 Units total) by mouth once a week. 12 capsule 1   fluticasone-salmeterol (ADVAIR) 250-50 MCG/ACT AEPB Inhale 1 puff into the lungs in the morning and at bedtime.     gabapentin (NEURONTIN) 300 MG capsule Take 300 mg by mouth 3 (three) times daily. Take 300 mg qam and lunch and take 600 mg qhs     HYDROcodone-acetaminophen (NORCO) 10-325 MG tablet Take 1 tablet by mouth every 6 (six) hours as needed.      ipratropium-albuterol (DUONEB) 0.5-2.5 (3) MG/3ML SOLN Take 3 mLs by nebulization 3 (three) times daily.     lidocaine-prilocaine (EMLA) cream Apply topically once.     Melatonin 10 MG TABS Take 10 mg by mouth at bedtime.     metFORMIN (GLUCOPHAGE) 500 MG tablet Take 1 tablet (500 mg total) by mouth  2 (two) times daily with a meal. 60 tablet 3   Multiple Vitamin (MULTI-VITAMIN) tablet Take 1 tablet by mouth at bedtime.     nystatin cream (MYCOSTATIN) Apply 1 application topically 2 (two) times daily as needed for dry skin (around lips).     pantoprazole (PROTONIX) 40 MG tablet Take 40 mg by mouth at bedtime.     potassium chloride SA (KLOR-CON) 20 MEQ tablet Take 20 mEq by mouth 2 (two) times daily.     pravastatin (PRAVACHOL) 80 MG tablet Take 80 mg by mouth at bedtime.     prochlorperazine (COMPAZINE) 10 MG tablet Take 1 tablet (10 mg total) by mouth every 6 (six) hours as needed (Nausea or vomiting). 30 tablet 1   tiZANidine (ZANAFLEX) 4 MG tablet Take 4 mg by mouth 3 (three) times daily.     traZODone (DESYREL) 100 MG tablet Take 50 mg by mouth at bedtime.     venlafaxine XR (EFFEXOR-XR) 75 MG 24 hr capsule Take 150 mg by mouth at bedtime.     docusate sodium (COLACE) 100 MG capsule Take 200 mg by mouth at bedtime. (Patient not taking: Reported on 04/15/2022)     hydrochlorothiazide (HYDRODIURIL) 25 MG tablet Take 25 mg by mouth daily. (Patient not taking: Reported on 04/15/2022)     No current facility-administered medications for this visit.      Marland Kitchen  PHYSICAL EXAMINATION: ECOG PERFORMANCE STATUS: 1 - Symptomatic but completely ambulatory  Vitals:   04/15/22 0932  BP: (!) 89/58  Pulse: 68  Resp: 18  Temp: (!) 96.4 F (35.8 C)  SpO2: 95%   Filed Weights   04/15/22 0932  Weight: 178 lb (80.7 kg)    Physical Exam Vitals and nursing note reviewed.  HENT:     Head: Normocephalic and atraumatic.     Mouth/Throat:     Mouth: Mucous membranes are moist.     Pharynx:  Oropharynx is clear. No oropharyngeal exudate.  Eyes:     Extraocular Movements: Extraocular movements intact.     Pupils: Pupils are equal, round, and reactive to light.  Cardiovascular:     Rate and Rhythm: Normal rate and regular rhythm.  Pulmonary:     Effort: No respiratory distress.     Breath sounds: No wheezing.     Comments: Decreased breath sounds bilaterally at bases.  No wheeze or crackles Abdominal:     General: Bowel sounds are normal. There is no distension.     Palpations: Abdomen is soft. There is no mass.     Tenderness: There is no abdominal tenderness. There is no guarding or rebound.  Musculoskeletal:        General: No tenderness. Normal range of motion.     Cervical back: Normal range of motion and neck supple.  Skin:    General: Skin is warm.  Neurological:     General: No focal deficit present.     Mental Status: She is alert and oriented to person, place, and time.     Comments: 4-5 strength in the left lower extremity/chronic.  Psychiatric:        Mood and Affect: Affect normal.        Behavior: Behavior normal.        Judgment: Judgment normal.      LABORATORY DATA:  I have reviewed the data as listed Lab Results  Component Value Date   WBC 6.0 04/15/2022   HGB 12.3 04/15/2022   HCT 36.7 04/15/2022   MCV  90.2 04/15/2022   PLT 150 04/15/2022   Recent Labs    03/04/22 0912 03/25/22 0910 04/15/22 0913  NA 137 137 137  K 3.4* 3.0* 3.7  CL 99 99 106  CO2 _0 GLUCOSE 152* 176* 143*  BUN _1 CREATININE 0.71 0.62 0.70  CALCIUM 9.1 9.0 8.7*  GFRNONAA >60 >60 >60  PROT 7.6 7.5 7.0  ALBUMIN 3.7 3.7 3.6  AST 38 30 35  ALT _2 ALKPHOS 53 57 53  BILITOT 0.3 0.3 0.6    RADIOGRAPHIC STUDIES: I have personally reviewed the radiological images as listed and agreed with the findings in the report. No results found.  ASSESSMENT & PLAN:   Malignant neoplasm of upper lobe of right lung ([facility]) # pT3N0 [4.5cm; Squamous cell  ca- + involvement of the parietal pleura]; stage II. Currently s/p adjuvant carbo-taxol x4 [poor candidate for cisplatin].  PD-L1-5%; MARCH 15th, 2023-no evidence of any recurrent disease; stable mediastinal lymph node/previously PET negative; fibrotic/bronchitic changes noted bilateral lower lobes [ex-smoker]; currently on adjuvant Atezolizumab. STABLE.   # Continue Adjuvant Atezoluzimab #. Labs today reviewed;  acceptable for treatment today. Labs today reviewed;  acceptable for treatment today.   # Mild hypotension: off HCTZ- ?  Increased frequency of urination: check UA/culture-no obvious evidence of infection.  Ordered cortisol.  # Hypokalemia: Kdur 3.0 [HCTZ]-overall STABLE- continue Kdur BID; See above.   # TSH: LOW; but free T3/T4 wnl- monitor for now. ? Sec to [facility]; repeat thyroid panel pending today.   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 continue-vit D 50,000-STABLE.    # chronic back pain/chronic left LE/Bil PN-history of cervical spine injury-no evidence of metastatic disease.  Continue hydrocodone [for now as per Dr. Doy Hutching. STABLE.    # History of diabetes- continue metformin 500 mg BID; [place] 2023-Hb A1c-Hb A1C-5.9]. STABLE.    # Depression?anxiety-chronic; compounded by poor social support. DECLINES psychiatry evaluation. STABLE.    # DISPOSITION:  # check UA; culture # proceed with Atezoluzumab # follow up in 3 weeks MD; labs- cbc/cmp; AM cortisol levels; thyroid panel-; Atzeoluzimab; - Dr.B  .  Cc: Dr.A/Dr.Sparks        All questions were answered. The patient knows to call the clinic with any problems, questions or concerns.       Cammie Sickle, MD 04/21/2022 8:12 AM

## 2022-04-15 NOTE — Progress Notes (Signed)
Patient here for oncology follow-up appointment,  concerns of low BP, urinary frequency, & severe back pain

## 2022-04-15 NOTE — Assessment & Plan Note (Addendum)
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 Currently s/p adjuvant carbo-taxol x4 [poor candidate for cisplatin].  PD-L1-5%; MARCH 15th, 2023-no evidence of any recurrent disease; stable mediastinal lymph node/previously PET negative; fibrotic/bronchitic changes noted bilateral lower lobes [ex-smoker]; currently on adjuvant Atezolizumab. STABLE.   # Continue Adjuvant Atezoluzimab #. Labs today reviewed;  acceptable for treatment today. Labs today reviewed;  acceptable for treatment today.   # Mild hypotension: off HCTZ- ?  Increased frequency of urination: check UA/culture-no obvious evidence of infection.  Ordered cortisol.  # Hypokalemia: Kdur 3.0 [HCTZ]-overall STABLE- continue Kdur BID; See above.   # TSH: LOW; but free T3/T4 wnl- monitor for now. ? Sec to [place]; repeat thyroid panel pending today.   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 continue-vit D 50,000-STABLE.    # chronic back pain/chronic left LE/Bil PN-history of cervical spine injury-no evidence of metastatic disease.  Continue hydrocodone [for now as per Dr. Doy Hutching. STABLE.    # History of diabetes- continue metformin 500 mg BID; [place] 2023-Hb A1c-Hb A1C-5.9]. STABLE.    # Depression?anxiety-chronic; compounded by poor social support. DECLINES psychiatry evaluation. STABLE.    # DISPOSITION:  # check UA; culture # proceed with Atezoluzumab # follow up in 3 weeks MD; labs- cbc/cmp; AM cortisol levels; thyroid panel-; Atzeoluzimab; - Dr.B  .  Cc: Dr.A/Dr.Sparks

## 2022-04-16 LAB — URINE CULTURE: Culture: <10000 — AB

## 2022-04-21 ENCOUNTER — Encounter: Payer: Self-pay | Admitting: Internal Medicine

## 2022-04-26 ENCOUNTER — Other Ambulatory Visit: Payer: Self-pay

## 2022-05-04 ENCOUNTER — Other Ambulatory Visit: Payer: Self-pay

## 2022-05-05 ENCOUNTER — Telehealth: Payer: Self-pay | Admitting: *Deleted

## 2022-05-05 NOTE — Telephone Encounter (Signed)
Patient called to cancel infusion appointment due to illness. Please call her to reschedule.

## 2022-05-06 ENCOUNTER — Inpatient Hospital Stay: Payer: Medicare HMO

## 2022-05-06 ENCOUNTER — Telehealth: Payer: Self-pay | Admitting: Internal Medicine

## 2022-05-06 ENCOUNTER — Other Ambulatory Visit: Payer: Self-pay

## 2022-05-06 ENCOUNTER — Inpatient Hospital Stay: Payer: Medicare HMO | Admitting: Internal Medicine

## 2022-05-06 NOTE — Telephone Encounter (Signed)
Pt had 8/3 appts moved to 8/11, She has called in to have 8/11 appts moved to 8/24 at 9/930am. Please advise

## 2022-05-08 ENCOUNTER — Other Ambulatory Visit: Payer: Self-pay

## 2022-05-10 NOTE — Progress Notes (Deleted)
Psychiatric Initial Adult Assessment   Patient Identification: Olivia Buchanan MRN:  [id number] Date of Evaluation:  05/10/2022 Referral Source: *** Chief Complaint:  No chief complaint on file.  Visit Diagnosis: No diagnosis found.  History of Present Illness:   Olivia Buchanan [name] is a 71 y.o. year old female with a history of depression, pT3N0 squamous cell lung cancer s/p adjuvant carbo-taxol x4, currently on adjuvant Atezolizumab, COPD, type II diabetes, hypertension, hyperlipidemia, who is referred for depression.       Associated Signs/Symptoms: Depression Symptoms:  {DEPRESSION SYMPTOMS:20000} (Hypo) Manic Symptoms:  {BHH MANIC SYMPTOMS:22872} Anxiety Symptoms:  {BHH ANXIETY SYMPTOMS:22873} Psychotic Symptoms:  {BHH PSYCHOTIC SYMPTOMS:22874} PTSD Symptoms: {BHH PTSD SYMPTOMS:22875}  Past Psychiatric History:  Outpatient:  Psychiatry admission:  Previous suicide attempt:  Past trials of medication:  History of violence:    Previous Psychotropic Medications: {YES/NO:21197}  Substance Abuse History in the last 12 months:  {yes no:314532}  Consequences of Substance Abuse: {BHH CONSEQUENCES OF SUBSTANCE ABUSE:22880}  Past Medical History:  Past Medical History:  Diagnosis Date   Angioedema 12/08/2019   Anxiety    Aortic atherosclerosis (HCC)    Asthma    CAD (coronary artery disease)    3 vessel   Cancer (HCC)    COPD (chronic obstructive pulmonary disease) ([place]) 12/08/2019   DDD (degenerative disc disease), lumbar    Depression    Diabetes mellitus without complication (HCC)    no meds, diet controlled   Dyspnea    GERD (gastroesophageal reflux disease)    HTN (hypertension) 12/08/2019   Hyperlipidemia    Hypertension    Mass of upper lobe of right lung 04/02/2021   spiculated mass RIGHT posterior pulmonary apex with associated hilar and pretracheal LAD; measured 3.2 x 2.8 cm   Obesity (BMI 30-39.9)    T2DM (type 2 diabetes mellitus) ([place]) 12/08/2019     Past Surgical History:  Procedure Laterality Date   BACK SURGERY     lumbar L5-S1 ruptured disc x 3 surgeries   CERVICAL FUSION  2009   FINE NEEDLE ASPIRATION  06/23/2021   Procedure: FINE NEEDLE ASPIRATION (FNA) LINEAR;  Surgeon: Garner Nash, DO;  Location: [facility];  Service: Pulmonary;;   INTERCOSTAL NERVE BLOCK Right 07/09/2021   Procedure: INTERCOSTAL NERVE BLOCK;  Surgeon: Lajuana Matte, MD;  Location: [facility];  Service: Thoracic;  Laterality: Right;   IR IMAGING GUIDED PORT INSERTION  08/06/2021   LAPAROSCOPIC TOTAL HYSTERECTOMY  1998   with oophorectomy   LYMPH NODE DISSECTION Right 07/09/2021   Procedure: LYMPH NODE DISSECTION;  Surgeon: Lajuana Matte, MD;  Location: [facility];  Service: Thoracic;  Laterality: Right;   TONSILLECTOMY     VIDEO BRONCHOSCOPY WITH ENDOBRONCHIAL NAVIGATION N/A 04/15/2021   Procedure: VIDEO BRONCHOSCOPY WITH ENDOBRONCHIAL NAVIGATION;  Surgeon: Ottie Glazier, MD;  Location: [facility];  Service: Thoracic;  Laterality: N/A;   VIDEO BRONCHOSCOPY WITH ENDOBRONCHIAL ULTRASOUND N/A 04/15/2021   Procedure: VIDEO BRONCHOSCOPY WITH ENDOBRONCHIAL ULTRASOUND;  Surgeon: Ottie Glazier, MD;  Location: [facility];  Service: Thoracic;  Laterality: N/A;   VIDEO BRONCHOSCOPY WITH ENDOBRONCHIAL ULTRASOUND N/A 06/23/2021   Procedure: VIDEO BRONCHOSCOPY WITH ENDOBRONCHIAL ULTRASOUND;  Surgeon: Garner Nash, DO;  Location: [facility];  Service: Pulmonary;  Laterality: N/A;    Family Psychiatric History: ***  Family History:  Family History  Problem Relation Age of Onset   Bone cancer Maternal Uncle     Social History:   Social History   Socioeconomic History  Marital status: Married    Spouse name: Orpah Greek   Number of children: 3   Years of education: Not on file   Highest education level: Not on file  Occupational History   Not on file  Tobacco Use   Smoking status: Former    Packs/day: 3.00    Years: 50.00    Total pack years: 150.00     Types: Cigarettes    Quit date: 2019    Years since quitting: 4.6   Smokeless tobacco: Never  Vaping Use   Vaping Use: Never used  Substance and Sexual Activity   Alcohol use: Not Currently   Drug use: Never   Sexual activity: Not on file    Comment: Hysterectomy  Other Topics Concern   Not on file  Social History Narrative   Lives in home; with husband; lives in [place]; never smoking 2019; no alcohol. 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    Social Determinants of Health   Financial Resource Strain: Not on [employer] Insecurity: Not on file  Transportation Needs: Not on file  Physical Activity: Not on file  Stress: Not on file  Social Connections: Not on file    Additional Social History: ***  Allergies:   Allergies  Allergen Reactions   Ibuprofen Hives   Macrobid [Nitrofurantoin] Rash    Metabolic Disorder Labs: Lab Results  Component Value Date   HGBA1C 5.9 (H) 12/15/2021   MPG 123 12/15/2021   MPG 168.55 07/06/2021   No results found for: "PROLACTIN" Lab Results  Component Value Date   CHOL 152 12/15/2021   TRIG 182 (H) 12/15/2021   HDL 34 (L) 12/15/2021   CHOLHDL 4.5 12/15/2021   VLDL 36 12/15/2021   LDLCALC 82 12/15/2021   Lab Results  Component Value Date   TSH 0.033 (L) 03/25/2022    Therapeutic Level Labs: No results found for: "LITHIUM" No results found for: "CBMZ" No results found for: "VALPROATE"  Current Medications: Current Outpatient Medications  Medication Sig Dispense Refill   albuterol (VENTOLIN HFA) 108 (90 Base) MCG/ACT inhaler Inhale 1 puff into the lungs every 6 (six) hours as needed for wheezing.     aspirin EC 81 MG tablet Take 81 mg by mouth at bedtime.     Biotin 1000 MCG tablet Take 1,000 mcg by mouth 3 (three) times daily.     clonazePAM (KLONOPIN) 0.5 MG tablet Take 0.5 mg by mouth in the morning, at noon, and at bedtime.     diphenoxylate-atropine (LOMOTIL) 2.5-0.025 MG tablet Take by  mouth.     docusate sodium (COLACE) 100 MG capsule Take 200 mg by mouth at bedtime. (Patient not taking: Reported on 04/15/2022)     EPINEPHrine 0.3 mg/0.3 mL IJ SOAJ injection Inject 0.3 mLs (0.3 mg total) into the muscle as needed for anaphylaxis. 2 each 0   ergocalciferol (VITAMIN D2) 1.25 MG (50000 UT) capsule Take 1 capsule (50,000 Units total) by mouth once a week. 12 capsule 1   fluticasone-salmeterol (ADVAIR) 250-50 MCG/ACT AEPB Inhale 1 puff into the lungs in the morning and at bedtime.     gabapentin (NEURONTIN) 300 MG capsule Take 300 mg by mouth 3 (three) times daily. Take 300 mg qam and lunch and take 600 mg qhs     hydrochlorothiazide (HYDRODIURIL) 25 MG tablet Take 25 mg by mouth daily. (Patient not taking: Reported on 04/15/2022)     HYDROcodone-acetaminophen (NORCO) 10-325 MG tablet Take 1 tablet by mouth every 6 (six)  hours as needed.     ipratropium-albuterol (DUONEB) 0.5-2.5 (3) MG/3ML SOLN Take 3 mLs by nebulization 3 (three) times daily.     lidocaine-prilocaine (EMLA) cream Apply topically once.     Melatonin 10 MG TABS Take 10 mg by mouth at bedtime.     metFORMIN (GLUCOPHAGE) 500 MG tablet Take 1 tablet (500 mg total) by mouth 2 (two) times daily with a meal. 60 tablet 3   Multiple Vitamin (MULTI-VITAMIN) tablet Take 1 tablet by mouth at bedtime.     nystatin cream (MYCOSTATIN) Apply 1 application topically 2 (two) times daily as needed for dry skin (around lips).     pantoprazole (PROTONIX) 40 MG tablet Take 40 mg by mouth at bedtime.     potassium chloride SA (KLOR-CON) 20 MEQ tablet Take 20 mEq by mouth 2 (two) times daily.     pravastatin (PRAVACHOL) 80 MG tablet Take 80 mg by mouth at bedtime.     prochlorperazine (COMPAZINE) 10 MG tablet Take 1 tablet (10 mg total) by mouth every 6 (six) hours as needed (Nausea or vomiting). 30 tablet 1   tiZANidine (ZANAFLEX) 4 MG tablet Take 4 mg by mouth 3 (three) times daily.     traZODone (DESYREL) 100 MG tablet Take 50 mg by  mouth at bedtime.     venlafaxine XR (EFFEXOR-XR) 75 MG 24 hr capsule Take 150 mg by mouth at bedtime.     No current facility-administered medications for this visit.    Musculoskeletal: Strength & Muscle Tone: within normal limits Gait & Station: normal Patient leans: N/A  Psychiatric Specialty Exam: Review of Systems  There were no vitals taken for this visit.There is no height or weight on file to calculate BMI.  General Appearance: {Appearance:22683}  Eye Contact:  {BHH EYE CONTACT:22684}  Speech:  Clear and Coherent  Volume:  Normal  Mood:  {BHH MOOD:22306}  Affect:  {Affect (PAA):22687}  Thought Process:  Coherent  Orientation:  Full (Time, Place, and Person)  Thought Content:  Logical  Suicidal Thoughts:  {ST/HT (PAA):22692}  Homicidal Thoughts:  {ST/HT (PAA):22692}  Memory:  Immediate;   Good  Judgement:  {Judgement (PAA):22694}  Insight:  {Insight (PAA):22695}  Psychomotor Activity:  Normal  Concentration:  Concentration: Good and Attention Span: Good  Recall:  Good  Fund of Knowledge:Good  Language: Good  Akathisia:  No  Handed:  Right  AIMS (if indicated):  not done  Assets:  Communication Skills Desire for Improvement  ADL's:  Intact  Cognition: WNL  Sleep:  {BHH GOOD/FAIR/POOR:22877}   Screenings: English as a second language teacher 62 from [facility]/06/2021 in [facility] PREADMISSION TESTING Admission (Discharged) from 06/23/2021 in [place] 47 from 04/13/2021 in [facility] No Risk No Risk No Risk       Assessment and Plan:  Assessment  Plan   The patient demonstrates the following risk factors for suicide: Chronic risk factors for suicide include: {Chronic Risk Factors for ZOXWRUE:45409811}. Acute risk factors for suicide include: {Acute Risk Factors for BJYNWGN:56213086}. Protective factors for this patient include:  {Protective Factors for Suicide VHQI:69629528}. Considering these factors, the overall suicide risk at this point appears to be {Desc; low/moderate/high:110033}. Patient {ACTION; IS/IS UXL:24401027} appropriate for outpatient follow up.        Collaboration of Care: {BH OP Collaboration of Care:21014065}  Patient/Guardian was advised Release of Information must be obtained prior to any record release in order to  collaborate their care with an outside provider. Patient/Guardian was advised if they have not already done so to contact the registration department to sign all necessary forms in order for [place] to release information regarding their care.   Consent: Patient/Guardian gives verbal consent for treatment and assignment of benefits for services provided during this visit. Patient/Guardian expressed understanding and agreed to proceed.   Norman Clay, MD 8/7/202310:08 AM

## 2022-05-13 ENCOUNTER — Ambulatory Visit: Payer: Self-pay | Admitting: Psychiatry

## 2022-05-14 ENCOUNTER — Inpatient Hospital Stay: Payer: Medicare HMO | Admitting: Internal Medicine

## 2022-05-14 ENCOUNTER — Inpatient Hospital Stay: Payer: Medicare HMO

## 2022-05-18 ENCOUNTER — Other Ambulatory Visit: Payer: Self-pay | Admitting: Internal Medicine

## 2022-05-18 DIAGNOSIS — C3411 Malignant neoplasm of upper lobe, right bronchus or lung: Secondary | ICD-10-CM | POA: Diagnosis not present

## 2022-05-18 DIAGNOSIS — J431 Panlobular emphysema: Secondary | ICD-10-CM | POA: Diagnosis not present

## 2022-05-18 DIAGNOSIS — Z79899 Other long term (current) drug therapy: Secondary | ICD-10-CM | POA: Diagnosis not present

## 2022-05-18 DIAGNOSIS — Z1231 Encounter for screening mammogram for malignant neoplasm of breast: Secondary | ICD-10-CM | POA: Diagnosis not present

## 2022-05-18 DIAGNOSIS — E7800 Pure hypercholesterolemia, unspecified: Secondary | ICD-10-CM | POA: Diagnosis not present

## 2022-05-18 DIAGNOSIS — I10 Essential (primary) hypertension: Secondary | ICD-10-CM | POA: Diagnosis not present

## 2022-05-18 DIAGNOSIS — E118 Type 2 diabetes mellitus with unspecified complications: Secondary | ICD-10-CM | POA: Diagnosis not present

## 2022-05-23 ENCOUNTER — Other Ambulatory Visit: Payer: Self-pay

## 2022-05-27 ENCOUNTER — Inpatient Hospital Stay: Payer: Medicare HMO | Attending: Internal Medicine

## 2022-05-27 ENCOUNTER — Encounter: Payer: Self-pay | Admitting: Internal Medicine

## 2022-05-27 ENCOUNTER — Inpatient Hospital Stay: Payer: Medicare HMO

## 2022-05-27 ENCOUNTER — Inpatient Hospital Stay (HOSPITAL_BASED_OUTPATIENT_CLINIC_OR_DEPARTMENT_OTHER): Payer: Medicare HMO | Admitting: Internal Medicine

## 2022-05-27 ENCOUNTER — Other Ambulatory Visit: Payer: Self-pay

## 2022-05-27 VITALS — BP 106/61 | HR 64 | Temp 97.4°F | Ht 59.0 in | Wt 186.9 lb

## 2022-05-27 DIAGNOSIS — D491 Neoplasm of unspecified behavior of respiratory system: Secondary | ICD-10-CM

## 2022-05-27 DIAGNOSIS — C3411 Malignant neoplasm of upper lobe, right bronchus or lung: Secondary | ICD-10-CM

## 2022-05-27 DIAGNOSIS — E876 Hypokalemia: Secondary | ICD-10-CM | POA: Insufficient documentation

## 2022-05-27 DIAGNOSIS — F419 Anxiety disorder, unspecified: Secondary | ICD-10-CM | POA: Diagnosis not present

## 2022-05-27 DIAGNOSIS — E119 Type 2 diabetes mellitus without complications: Secondary | ICD-10-CM | POA: Insufficient documentation

## 2022-05-27 DIAGNOSIS — E032 Hypothyroidism due to medicaments and other exogenous substances: Secondary | ICD-10-CM

## 2022-05-27 DIAGNOSIS — Z5112 Encounter for antineoplastic immunotherapy: Secondary | ICD-10-CM | POA: Diagnosis not present

## 2022-05-27 DIAGNOSIS — G620 Drug-induced polyneuropathy: Secondary | ICD-10-CM | POA: Diagnosis not present

## 2022-05-27 DIAGNOSIS — Z79899 Other long term (current) drug therapy: Secondary | ICD-10-CM | POA: Insufficient documentation

## 2022-05-27 LAB — COMPREHENSIVE METABOLIC PANEL
ALT: 24 U/L (ref 0–44)
AST: 42 U/L — ABNORMAL HIGH (ref 15–41)
Albumin: 4.1 g/dL (ref 3.5–5.0)
Alkaline Phosphatase: 60 U/L (ref 38–126)
Anion gap: 7 (ref 5–15)
BUN: 13 mg/dL (ref 8–23)
CO2: 27 mmol/L (ref 22–32)
Calcium: 9 mg/dL (ref 8.9–10.3)
Chloride: 103 mmol/L (ref 98–111)
Creatinine, Ser: 0.99 mg/dL (ref 0.44–1.00)
GFR, Estimated: >60 mL/min (ref >60)
Glucose, Bld: 121 mg/dL — ABNORMAL HIGH (ref 70–99)
Potassium: 4.4 mmol/L (ref 3.5–5.1)
Sodium: 137 mmol/L (ref 135–145)
Total Bilirubin: 0.4 mg/dL (ref 0.3–1.2)
Total Protein: 7.5 g/dL (ref 6.5–8.1)

## 2022-05-27 LAB — CBC WITH DIFFERENTIAL/PLATELET
Abs Immature Granulocytes: 0.02 10*3/uL (ref 0.00–0.07)
Basophils Absolute: 0.1 10*3/uL (ref 0.0–0.1)
Basophils Relative: 1 %
Eosinophils Absolute: 0.7 10*3/uL — ABNORMAL HIGH (ref 0.0–0.5)
Eosinophils Relative: 9 %
HCT: 36.8 % (ref 36.0–46.0)
Hemoglobin: 12.2 g/dL (ref 12.0–15.0)
Immature Granulocytes: 0 %
Lymphocytes Relative: 32 %
Lymphs Abs: 2.3 10*3/uL (ref 0.7–4.0)
MCH: 30.3 pg (ref 26.0–34.0)
MCHC: 33.2 g/dL (ref 30.0–36.0)
MCV: 91.3 fL (ref 80.0–100.0)
Monocytes Absolute: 0.4 10*3/uL (ref 0.1–1.0)
Monocytes Relative: 5 %
Neutro Abs: 3.9 10*3/uL (ref 1.7–7.7)
Neutrophils Relative %: 53 %
Platelets: 141 10*3/uL — ABNORMAL LOW (ref 150–400)
RBC: 4.03 MIL/uL (ref 3.87–5.11)
RDW: 14.2 % (ref 11.5–15.5)
WBC: 7.3 10*3/uL (ref 4.0–10.5)
nRBC: 0 % (ref 0.0–0.2)

## 2022-05-27 LAB — TSH: TSH: 100 u[IU]/mL — ABNORMAL HIGH (ref 0.350–4.500)

## 2022-05-27 MED ORDER — SODIUM CHLORIDE 0.9% FLUSH
10.0000 mL | INTRAVENOUS | Status: DC | PRN
  Filled 2022-05-27: qty 10

## 2022-05-27 MED ORDER — LEVOTHYROXINE SODIUM 150 MCG PO TABS
150.0000 ug | ORAL_TABLET | Freq: Every day | ORAL | 3 refills | Status: DC
Start: 2022-05-27 — End: 2022-06-18

## 2022-05-27 MED ORDER — SODIUM CHLORIDE 0.9 % IV SOLN
Freq: Once | INTRAVENOUS | Status: AC
  Filled 2022-05-27: qty 250

## 2022-05-27 MED ORDER — HEPARIN SOD (PORK) LOCK FLUSH 100 UNIT/ML IV SOLN
500.0000 [IU] | Freq: Once | INTRAVENOUS | Status: AC | PRN
  Administered 2022-05-27: 500 [IU]
  Filled 2022-05-27: qty 5

## 2022-05-27 MED ORDER — SODIUM CHLORIDE 0.9 % IV SOLN
1200.0000 mg | Freq: Once | INTRAVENOUS | Status: AC
  Administered 2022-05-27: 1200 mg via INTRAVENOUS
  Filled 2022-05-27: qty 20

## 2022-05-27 NOTE — Progress Notes (Signed)
Prior Auth pending review - will take 24hrs to expedite per PA team.  Since it is continuation ok to proceed per MD and PA team

## 2022-05-27 NOTE — Progress Notes (Signed)
ON PATHWAY REGIMEN - Non-Small Cell Lung  No Change  Continue With Treatment as Ordered.  Original Decision Date/Time: 10/20/2021 18:51     A cycle is every 21 days:     Atezolizumab   **Always confirm dose/schedule in your pharmacy ordering system**  Patient Characteristics: Postoperative without Neoadjuvant Therapy (Pathologic Staging), Stage IIB, Adjuvant Chemotherapy Completed, EGFR Negative/Unknown and ALK Negative/Unknown, PD-L1 Expression ? 1% (TC) Therapeutic Status: Postoperative without Neoadjuvant Therapy (Pathologic Staging) AJCC T Category: pT3 AJCC N Category: pN0 AJCC M Category: cM0 AJCC 8 Stage Grouping: IIB ALK Rearrangement Status: Negative EGFR Mutation Status: Negative/Wild Type PD-L1 Expression Status: PD-L1 Expression ? 1% (TC) Intent of Therapy: Curative Intent, Discussed with Patient

## 2022-05-27 NOTE — Progress Notes (Signed)
C/o being off balance. She feels like she may be retaining fluid, has gained 8 lbs.  Would like cbc(done today), cmp(done today) thyroid stimulating hormone, thyroxine, triiodothyronine, u/s, a1c drawn for Dr. Doy Hutching.

## 2022-05-27 NOTE — Assessment & Plan Note (Addendum)
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 Currently s/p adjuvant carbo-taxol x4 [poor candidate for cisplatin].  PD-L1-5%; MARCH 15th, 2023-no evidence of any recurrent disease; stable mediastinal lymph node/previously PET negative; fibrotic/bronchitic changes noted bilateral lower lobes [ex-smoker]; currently on adjuvant Atezolizumab. STABLE.  Multiple interruptions given patient's overall general co-morbidities-unrelated to treatment.  # Continue Adjuvant Atezoluzimab #6. Labs today reviewed;  acceptable for treatment today. Labs today reviewed;  acceptable for treatment today.  See discussion regarding hypothyroidism.   # Hypokalemia: Off hydrochlorothiazide improved 4.4 potassium today.  Restart- hydrochlorothiazide monitor potassium closely.  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 continue-vit D 50,000-STABLE.    # chronic back pain/chronic left LE/Bil PN-history of cervical spine injury-no evidence of metastatic disease.  Continue hydrocodone [for now as per Dr. Doy Hutching. STABLE.    # History of diabetes- continue metformin 500 mg BID; [place] 2023-Hb A1c-Hb A1C-5.9]. STABLE.    # Depression?anxiety-chronic; compounded by poor social support. DECLINES psychiatry evaluation. STABLE.    #Extreme fatigue-weight gain-TSH-100.  Iatrogenic hypothyroidism-recommend starting Synthroid 150 mcg/day-on empty stomach.   # DISPOSITION:  # ADD TSH to labs today # proceed with Atezoluzumab;  # follow up in 3 weeks MD; labs- cbc/cmp;; Atzeoluzimab; - Dr.B  .  Cc: Dr.A/Dr.Sparks

## 2022-05-27 NOTE — Progress Notes (Signed)
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 Spiculated mass of the posterior right pulmonary apex measuring 3.2 x 2.8 cm, consistent with primary lung malignancy. 2. Enlarged right hilar and pretracheal lymph nodes measuring up to 1.6 x 1.5 cm, suspicious for nodal metastatic disease. 3. Recommend multidisciplinary thoracic referral for consideration of PET-CT metabolic characterization and tissue sampling. 4. Background of very fine centrilobular nodularity throughout the lungs, most concentrated at the apices. Scattered bronchiolar plugging. Findings are most consistent with smoking-related respiratory bronchiolitis. 5. Coarse, nodular contour of the liver in the included upper abdomen, suggestive of cirrhosis. 6. Coronary artery disease.  # COPD-  # DM- diet controlled; chronic back pain- hydrocodone 5 day; chronic left lower extremity weakness.  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 LYMPH NODE, HILAR, EXCISION:  - Lymph node negative for metastatic carcinoma.   B. LYMPH NODE, LEVEL 4, EXCISION:  - Lymph node negative for metastatic carcinoma.   C. LUNG, RIGHT UPPER LOBE, LOBECTOMY:  - Squamous cell carcinoma, 4.5 cm.  - Carcinoma extends through visceral pleura and focally involves  parietal pleura.  - Surgical margins negative for carcinoma.  - One lymph node negative for carcinoma.  - See oncology table and comment.   D. SOFT  TISSUE, CHEST WALL, BIOPSY:  - Fibroadipose tissue with focal inflammation and fibrosis.  - No carcinoma identified.   ONCOLOGY TABLE:  LUNG: Resection  Synchronous Tumors: Not applicable.  Total Number of Primary Tumors: 1  Procedure: Right upper lobectomy with lymph node biopsies and chest wall  biopsy.  Specimen Laterality: Right upper lobe.  Tumor Focality: Unifocal.  Tumor Site: Right upper lobe.  Tumor Size: 4.5 x 4 x 3 cm.  Histologic Type: Squamous cell carcinoma.  Visceral Pleura Invasion: Present.  Direct Invasion of Adjacent Structures: Focal involvement of parietal  pleura.  Lymphovascular Invasion: Not identified.  Margins: All surgical margins negative for carcinoma.  Regional Lymph Nodes:       Number of Lymph Nodes Involved: 0                            Nodal Sites with Tumor: 0       Number of Lymph Nodes Examined: 3                       Nodal Sites Examined: 2  Pathologic Stage Classification (pTNM, AJCC 8th Edition): pT3, pN0  Ancillary Studies: Can be performed if requested.   #Right upper lobe stage IIb-pT3 [not cisplatin candidate]; CarboTaxol x4. April 11th, 2023-Atezo q 3 w x1 year  # AUG 2023- IATROGENIC HYPOTHYRODISM- TSH 100; START SYNTHROID   Malignant neoplasm of upper lobe of right lung (HCC)  04/24/2021 Initial Diagnosis   Malignant neoplasm of upper lobe of right lung (HCC)   05/02/2021 Cancer Staging   Staging form: Lung, AJCC 8th Edition -   Clinical: Stage IB (cT2a, cN0, cM0) - Signed by Kashon Kraynak R, MD on 05/02/2021 Stage prefix: Initial diagnosis   07/23/2021 Cancer Staging   Staging form: Lung, AJCC 8th Edition - Pathologic: Stage IIB (pT3, pN0, cM0) - Signed by Sullivan Blasing R, MD on 07/23/2021 Stage prefix: Initial diagnosis    HISTORY OF PRESENTING ILLNESS: Patient ambulating independently.  Alone.   Olivia Buchanan 71 y.o.  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  Patient was taken off her hydrochlorothiazide diuretic because of borderline low blood pressures.   However patient noted to have increased weight gain in the last couple of months.  No diarrhea.  Complains of extreme fatigue.  Feels poorly complains of worsening myalgias.  Mild nausea no vomiting.  No worsening tingling or numbness.  Chronic back pain.  Chronic difficulty breathing.  No headaches.  No chest pain.  Review of Systems  Constitutional:  Positive for malaise/fatigue and weight loss. Negative for chills, diaphoresis and fever.  HENT:  Negative for nosebleeds and sore throat.   Eyes:  Negative for double vision.  Respiratory:  Positive for cough and shortness of breath. Negative for hemoptysis and wheezing.   Cardiovascular:  Negative for chest pain, palpitations, orthopnea and leg swelling.  Gastrointestinal:  Negative for abdominal pain, blood in stool, constipation, diarrhea, heartburn, melena and vomiting.  Genitourinary:  Negative for dysuria, frequency and urgency.  Musculoskeletal:  Positive for back pain, joint pain and neck pain.  Skin: Negative.  Negative for itching and rash.  Neurological:  Negative for tingling, focal weakness and weakness.  Endo/Heme/Allergies:  Does not bruise/bleed easily.  Psychiatric/Behavioral:  Negative for depression. The patient is not nervous/anxious and does not have insomnia.      MEDICAL HISTORY:  Past Medical History:  Diagnosis Date   Angioedema 12/08/2019   Anxiety    Aortic atherosclerosis (HCC)    Asthma    CAD (coronary artery disease)    3 vessel   Cancer (HCC)    COPD (chronic obstructive pulmonary disease) (HCC) 12/08/2019   DDD (degenerative disc disease), lumbar    Depression    Diabetes mellitus without complication (HCC)    no meds, diet controlled   Dyspnea    GERD (gastroesophageal reflux disease)    HTN (hypertension) 12/08/2019   Hyperlipidemia    Hypertension    Mass of upper lobe of  right lung 04/02/2021   spiculated mass RIGHT posterior pulmonary apex with associated hilar and pretracheal LAD; measured 3.2 x 2.8 cm   Obesity (BMI 30-39.9)    T2DM (type 2 diabetes mellitus) (HCC) 12/08/2019    SURGICAL HISTORY: Past Surgical History:  Procedure Laterality Date   BACK SURGERY     lumbar L5-S1 ruptured disc x 3 surgeries   CERVICAL FUSION  2009   FINE NEEDLE ASPIRATION  06/23/2021   Procedure: FINE NEEDLE ASPIRATION (FNA) LINEAR;  Surgeon: Icard, Bradley L, DO;  Location: [facility];  Service: Pulmonary;;   INTERCOSTAL NERVE BLOCK Right 07/09/2021   Procedure: INTERCOSTAL NERVE BLOCK;  Surgeon: Lightfoot, Harrell O, MD;  Location: [facility] OR;  Service: Thoracic;  Laterality: Right;   IR IMAGING GUIDED PORT INSERTION  08/06/2021   LAPAROSCOPIC TOTAL HYSTERECTOMY  1998   with oophorectomy   LYMPH NODE DISSECTION Right 07/09/2021   Procedure: LYMPH NODE DISSECTION;  Surgeon: Lightfoot, Harrell O, MD;  Location: [facility] OR;  Service: Thoracic;  Laterality: Right;   TONSILLECTOMY     VIDEO BRONCHOSCOPY   WITH ENDOBRONCHIAL NAVIGATION N/A 04/15/2021   Procedure: VIDEO BRONCHOSCOPY WITH ENDOBRONCHIAL NAVIGATION;  Surgeon: Ottie Glazier, MD;  Location: [facility];  Service: Thoracic;  Laterality: N/A;   VIDEO BRONCHOSCOPY WITH ENDOBRONCHIAL ULTRASOUND N/A 04/15/2021   Procedure: VIDEO BRONCHOSCOPY WITH ENDOBRONCHIAL ULTRASOUND;  Surgeon: Ottie Glazier, MD;  Location: [facility];  Service: Thoracic;  Laterality: N/A;   VIDEO BRONCHOSCOPY WITH ENDOBRONCHIAL ULTRASOUND N/A 06/23/2021   Procedure: VIDEO BRONCHOSCOPY WITH ENDOBRONCHIAL ULTRASOUND;  Surgeon: Garner Nash, DO;  Location: [place];  Service: Pulmonary;  Laterality: N/A;    SOCIAL HISTORY: Social History   Socioeconomic History   Marital status: Married    Spouse name: Dwight   Number of children: 3   Years of education: Not on file   Highest education level: Not on file  Occupational History   Not on file  Tobacco  Use   Smoking status: Former    Packs/day: 3.00    Years: 50.00    Total pack years: 150.00    Types: Cigarettes    Quit date: 2019    Years since quitting: 4.6   Smokeless tobacco: Never  Vaping Use   Vaping Use: Never used  Substance and Sexual Activity   Alcohol use: Not Currently   Drug use: Never   Sexual activity: Not on file    Comment: Hysterectomy  Other Topics Concern   Not on file  Social History Narrative   Lives in home; with husband; lives in [place]; never smoking 2019; no alcohol. 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    Social Determinants of Health   Financial Resource Strain: Not on file  Food Insecurity: Not on file  Transportation Needs: Not on file  Physical Activity: Not on file  Stress: Not on file  Social Connections: Not on file  Intimate Partner Violence: Not on file    FAMILY HISTORY: Family History  Problem Relation Age of Onset   Bone cancer Maternal Uncle     ALLERGIES:  is allergic to ibuprofen and macrobid [nitrofurantoin].  MEDICATIONS:  Current Outpatient Medications  Medication Sig Dispense Refill   albuterol (VENTOLIN HFA) 108 (90 Base) MCG/ACT inhaler Inhale 1 puff into the lungs every 6 (six) hours as needed for wheezing.     aspirin EC 81 MG tablet Take 81 mg by mouth at bedtime.     Biotin 1000 MCG tablet Take 1,000 mcg by mouth 3 (three) times daily.     clonazePAM (KLONOPIN) 0.5 MG tablet Take 0.5 mg by mouth in the morning, at noon, and at bedtime.     diphenoxylate-atropine (LOMOTIL) 2.5-0.025 MG tablet Take by mouth.     EPINEPHrine 0.3 mg/0.3 mL IJ SOAJ injection Inject 0.3 mLs (0.3 mg total) into the muscle as needed for anaphylaxis. 2 each 0   ergocalciferol (VITAMIN D2) 1.25 MG (50000 UT) capsule Take 1 capsule (50,000 Units total) by mouth once a week. 12 capsule 1   fluticasone-salmeterol (ADVAIR) 250-50 MCG/ACT AEPB Inhale 1 puff into the lungs in the morning and at bedtime.     gabapentin  (NEURONTIN) 300 MG capsule Take 300 mg by mouth 3 (three) times daily. Take 300 mg qam and lunch and take 600 mg qhs     HYDROcodone-acetaminophen (NORCO) 10-325 MG tablet Take 1 tablet by mouth every 6 (six) hours as needed.     ipratropium-albuterol (DUONEB) 0.5-2.5 (3) MG/3ML SOLN Take 3 mLs by nebulization 3 (three) times daily.     levothyroxine (SYNTHROID) 150 MCG tablet  Take 1 tablet (150 mcg total) by mouth daily before breakfast. Take 30- 60 mins prior to break fast; on empty stomach. 30 tablet 3   lidocaine-prilocaine (EMLA) cream Apply topically once.     Melatonin 10 MG TABS Take 10 mg by mouth at bedtime.     metFORMIN (GLUCOPHAGE) 500 MG tablet Take 1 tablet (500 mg total) by mouth 2 (two) times daily with a meal. 60 tablet 3   Multiple Vitamin (MULTI-VITAMIN) tablet Take 1 tablet by mouth at bedtime.     nystatin cream (MYCOSTATIN) Apply 1 application topically 2 (two) times daily as needed for dry skin (around lips).     pantoprazole (PROTONIX) 40 MG tablet Take 40 mg by mouth at bedtime.     potassium chloride SA (KLOR-CON) 20 MEQ tablet Take 20 mEq by mouth 2 (two) times daily.     pravastatin (PRAVACHOL) 80 MG tablet Take 80 mg by mouth at bedtime.     prochlorperazine (COMPAZINE) 10 MG tablet Take 1 tablet (10 mg total) by mouth every 6 (six) hours as needed (Nausea or vomiting). 30 tablet 1   tiZANidine (ZANAFLEX) 4 MG tablet Take 4 mg by mouth 3 (three) times daily.     traZODone (DESYREL) 100 MG tablet Take 50 mg by mouth at bedtime.     venlafaxine XR (EFFEXOR-XR) 75 MG 24 hr capsule Take 150 mg by mouth at bedtime.     docusate sodium (COLACE) 100 MG capsule Take 200 mg by mouth at bedtime. (Patient not taking: Reported on 04/15/2022)     hydrochlorothiazide (HYDRODIURIL) 25 MG tablet Take 25 mg by mouth daily. (Patient not taking: Reported on 04/15/2022)     No current facility-administered medications for this visit.      Marland Kitchen  PHYSICAL EXAMINATION: ECOG PERFORMANCE  STATUS: 1 - Symptomatic but completely ambulatory  Vitals:   05/27/22 0923  BP: 106/61  Pulse: 64  Temp: (!) 97.4 F (36.3 C)  SpO2: 99%   Filed Weights   05/27/22 0923  Weight: 186 lb 14.4 oz (84.8 kg)    Physical Exam Vitals and nursing note reviewed.  HENT:     Head: Normocephalic and atraumatic.     Mouth/Throat:     Mouth: Mucous membranes are moist.     Pharynx: Oropharynx is clear. No oropharyngeal exudate.  Eyes:     Extraocular Movements: Extraocular movements intact.     Pupils: Pupils are equal, round, and reactive to light.  Cardiovascular:     Rate and Rhythm: Normal rate and regular rhythm.  Pulmonary:     Effort: No respiratory distress.     Breath sounds: No wheezing.     Comments: Decreased breath sounds bilaterally at bases.  No wheeze or crackles Abdominal:     General: Bowel sounds are normal. There is no distension.     Palpations: Abdomen is soft. There is no mass.     Tenderness: There is no abdominal tenderness. There is no guarding or rebound.  Musculoskeletal:        General: No tenderness. Normal range of motion.     Cervical back: Normal range of motion and neck supple.  Skin:    General: Skin is warm.  Neurological:     General: No focal deficit present.     Mental Status: She is alert and oriented to person, place, and time.     Comments: 4-5 strength in the left lower extremity/chronic.  Psychiatric:        Mood  and Affect: Affect normal.        Behavior: Behavior normal.        Judgment: Judgment normal.      LABORATORY DATA:  I have reviewed the data as listed Lab Results  Component Value Date   WBC 7.3 05/27/2022   HGB 12.2 05/27/2022   HCT 36.8 05/27/2022   MCV 91.3 05/27/2022   PLT 141 (L) 05/27/2022   Recent Labs    03/25/22 0910 04/15/22 0913 05/27/22 0915  NA 137 137 137  K 3.0* 3.7 4.4  CL 99 106 103  CO2 $Re'27 25 27  'dFt$ GLUCOSE 176* 143* 121*  BUN $Re'10 11 13  'tIR$ CREATININE 0.62 0.70 0.99  CALCIUM 9.0 8.7* 9.0   GFRNONAA >60 >60 >60  PROT 7.5 7.0 7.5  ALBUMIN 3.7 3.6 4.1  AST 30 35 42*  ALT $Re'19 18 24  'nug$ ALKPHOS 57 53 60  BILITOT 0.3 0.6 0.4    RADIOGRAPHIC STUDIES: I have personally reviewed the radiological images as listed and agreed with the findings in the report. No results found.  ASSESSMENT & PLAN:   Malignant neoplasm of upper lobe of right lung ([place]) # pT3N0 [4.5cm; Squamous cell ca- + involvement of the parietal pleura]; stage II. Currently s/p adjuvant carbo-taxol x4 [poor candidate for cisplatin].  PD-L1-5%; MARCH 15th, 2023-no evidence of any recurrent disease; stable mediastinal lymph node/previously PET negative; fibrotic/bronchitic changes noted bilateral lower lobes [ex-smoker]; currently on adjuvant Atezolizumab. STABLE.  Multiple interruptions given patient's overall general co-morbidities-unrelated to treatment.  # Continue Adjuvant Atezoluzimab #6. Labs today reviewed;  acceptable for treatment today. Labs today reviewed;  acceptable for treatment today.  See discussion regarding hypothyroidism.   # Hypokalemia: Off hydrochlorothiazide improved 4.4 potassium today.  Restart- hydrochlorothiazide monitor potassium closely.  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 continue-vit D 50,000-STABLE.    # chronic back pain/chronic left LE/Bil PN-history of cervical spine injury-no evidence of metastatic disease.  Continue hydrocodone [for now as per Dr. Doy Hutching. STABLE.    # History of diabetes- continue metformin 500 mg BID; [place] 2023-Hb A1c-Hb A1C-5.9]. STABLE.    # Depression?anxiety-chronic; compounded by poor social support. DECLINES psychiatry evaluation. STABLE.    #Extreme fatigue-weight gain-TSH-100.  Iatrogenic hypothyroidism-recommend starting Synthroid 150 mcg/day-on empty stomach.   # DISPOSITION:  # ADD TSH to labs today # proceed with Atezoluzumab;  # follow up in 3 weeks MD; labs-  cbc/cmp;; Atzeoluzimab; - Dr.B  .  Cc: Dr.A/Dr.Sparks        All questions were answered. The patient knows to call the clinic with any problems, questions or concerns.       Cammie Sickle, MD 05/27/2022 8:39 PM

## 2022-05-27 NOTE — Patient Instructions (Signed)
Instrucciones al darle de alta: Discharge Instructions Gracias por elegir al [facility] [place] para brindarle atencin mdica de oncologa y Music therapist.  Si usted tiene cita de laboratorio con [employer] de [place], por favor entre por la puerta principal y regstrese en el escritorio central de informacin.   Use ropa cmoda y [place] para tener fcil acceso a las vas del Portacath (acceso venoso de Engineer, site duracin) o la lnea PICC (catter central colocado por va perifrica).   Nos esforzamos por ofrecerle tiempo de calidad con su proveedor. Es posible que tenga que volver a programar su cita si llega tarde (15 minutos o ms).  El llegar tarde le afecta a usted y a otros pacientes cuyas citas son posteriores a Merchandiser, retail.  Adems, si usted falta a tres o ms citas sin avisar a la oficina, puede ser retirado(a) de la clnica a discrecin del proveedor.      Para las solicitudes de renovacin de recetas, pida a su farmacia que se ponga en contacto con nuestra oficina y deje que transcurran 47 horas para que se complete el proceso de las renovaciones.    Hoy usted recibi los siguientes agentes de quimioterapia e/o inmunoterapia       Para ayudar a prevenir las nuseas y los vmitos despus de su tratamiento, le recomendamos que tome su medicamento para las nuseas segn las indicaciones.  LOS SNTOMAS QUE DEBEN COMUNICARSE INMEDIATAMENTE SE INDICAN A CONTINUACIN: *FIEBRE SUPERIOR A 100.4 F (38 C) O MS *ESCALOFROS O SUDORACIN *NUSEAS Y VMITOS QUE NO SE CONTROLAN CON EL MEDICAMENTO PARA LAS NUSEAS *DIFICULTAD INUSUAL PARA RESPIRAR  *MORETONES O HEMORRAGIAS NO HABITUALES *PROBLEMAS URINARIOS (dolor o ardor al Garment/textile technologist o frecuencia para Garment/textile technologist) *PROBLEMAS INTESTINALES (diarrea inusual, estreimiento, dolor cerca del ano) SENSIBILIDAD EN LA BOCA Y EN LA GARGANTA CON O SIN LA PRESENCIA DE LCERAS (dolor de garganta, llagas en la boca o dolor de muelas/dientes) ERUPCIN, HINCHAZN  O DOLORES INUSUALES FLUJO VAGINAL INUSUAL O PICAZN/RASQUIA    Los puntos marcados con un asterisco ( *) indican una posible emergencia y debe hacer un seguimiento tan pronto como le sea posible o vaya al Departamento de Emergencias si se le presenta algn problema.  Por favor, muestre la [place] DE ADVERTENCIA DE [name] DE ADVERTENCIA DE [name] al registrarse en [address] de Emergencias y a la enfermera de triaje.  Si tiene preguntas despus de su visita o necesita cancelar o volver a programar su cita, por favor pngase en contacto con [facility] [place] [phone]  y [place] instrucciones. Las horas de oficina son de 8:00 a.m. a 4:30 p.m. de lunes a viernes. Por favor, tenga en cuenta que los mensajes de voz que se dejan despus de las 4:00 p.m. posiblemente no se devolvern hasta el siguiente da de [name].  Cerramos los fines de semana y [name] En todo momento tiene acceso a una enfermera para preguntas urgentes. Por favor, llame al nmero principal de la clnica [phone] y [place] instrucciones.   Para cualquier pregunta que no sea de carcter urgente, tambin puede ponerse en contacto con su proveedor [employer] Ahora ofrecemos visitas electrnicas para cualquier persona mayor de 18 aos que solicite atencin mdica en lnea para los sntomas que no sean urgentes. Para ms detalles vaya a [email or web address].   Tambin puede bajar la aplicacin de MyChart! Vaya a la tienda de aplicaciones, busque "MyChart", abra la aplicacin, seleccione [place], e ingrese  con su nombre de usuario y la contrasea de Pharmacist, community.  Las mscaras son opcionales en los centros de Hotel manager. Si desea que su equipo de cuidados mdicos use una [employer] atienden, por favor hgaselo saber al personal. Olivia Buchanan una persona de apoyo que tenga por lo menos 16 aos para que le acompae a sus citas.

## 2022-05-27 NOTE — Progress Notes (Signed)
FYI- please inform pt of the below message. Thanks, GB ------------------------------------------------------------------ Hi Olivia Buchanan- just wanted to let you know that your TSH/thyroid tests are quite abnormal. This can explain your body aches; and also possible weight gain/ and fatigue.   I would recommend starting on synthroid 150 mcg a day. I have sent the script to your pharmacy. Please take in the morning on empty stomach as instructed. Will recheck at next visit.  Call us if any questions. Thanks, GB

## 2022-05-28 ENCOUNTER — Telehealth: Payer: Self-pay

## 2022-05-28 ENCOUNTER — Other Ambulatory Visit: Payer: Self-pay

## 2022-05-28 NOTE — Telephone Encounter (Signed)
-----   Message from Cammie Sickle, MD sent at 05/27/2022 10:26 PM EDT ----- [name]- please inform pt of the below message. Thanks, GB ------------------------------------------------------------------ Hi Olivia Buchanan- just wanted to let you know that your TSH/thyroid tests are quite abnormal. This can explain your body aches; and also possible weight gain/ and fatigue.   I would recommend starting on synthroid 150 mcg a day. I have sent the script to your pharmacy. Please take in the morning on empty stomach as instructed. Will recheck at next visit.  Call us if any questions. Thanks, GB

## 2022-05-28 NOTE — Telephone Encounter (Signed)
Pt wants to know if /when she starts and finishes tecentriq will her thyroid levels go back to normal? Or will she have to continue to take synthroid?

## 2022-05-28 NOTE — Telephone Encounter (Signed)
Pt notified by detailed message.

## 2022-06-04 ENCOUNTER — Other Ambulatory Visit: Payer: Self-pay

## 2022-06-17 ENCOUNTER — Inpatient Hospital Stay: Payer: Medicare HMO

## 2022-06-17 ENCOUNTER — Encounter: Payer: Self-pay | Admitting: Internal Medicine

## 2022-06-17 ENCOUNTER — Inpatient Hospital Stay: Payer: Medicare HMO | Attending: Internal Medicine | Admitting: Internal Medicine

## 2022-06-17 VITALS — BP 132/81 | HR 93 | Temp 97.2°F | Ht 59.0 in | Wt 178.6 lb

## 2022-06-17 DIAGNOSIS — Z79899 Other long term (current) drug therapy: Secondary | ICD-10-CM | POA: Diagnosis not present

## 2022-06-17 DIAGNOSIS — Z79890 Hormone replacement therapy (postmenopausal): Secondary | ICD-10-CM | POA: Insufficient documentation

## 2022-06-17 DIAGNOSIS — F419 Anxiety disorder, unspecified: Secondary | ICD-10-CM | POA: Insufficient documentation

## 2022-06-17 DIAGNOSIS — E876 Hypokalemia: Secondary | ICD-10-CM | POA: Diagnosis not present

## 2022-06-17 DIAGNOSIS — D491 Neoplasm of unspecified behavior of respiratory system: Secondary | ICD-10-CM

## 2022-06-17 DIAGNOSIS — R519 Headache, unspecified: Secondary | ICD-10-CM

## 2022-06-17 DIAGNOSIS — Z5112 Encounter for antineoplastic immunotherapy: Secondary | ICD-10-CM | POA: Diagnosis not present

## 2022-06-17 DIAGNOSIS — C3411 Malignant neoplasm of upper lobe, right bronchus or lung: Secondary | ICD-10-CM | POA: Diagnosis not present

## 2022-06-17 DIAGNOSIS — J449 Chronic obstructive pulmonary disease, unspecified: Secondary | ICD-10-CM | POA: Diagnosis not present

## 2022-06-17 DIAGNOSIS — E039 Hypothyroidism, unspecified: Secondary | ICD-10-CM | POA: Insufficient documentation

## 2022-06-17 DIAGNOSIS — E119 Type 2 diabetes mellitus without complications: Secondary | ICD-10-CM | POA: Diagnosis not present

## 2022-06-17 DIAGNOSIS — E032 Hypothyroidism due to medicaments and other exogenous substances: Secondary | ICD-10-CM

## 2022-06-17 LAB — CBC WITH DIFFERENTIAL/PLATELET
Abs Immature Granulocytes: 0.02 10*3/uL (ref 0.00–0.07)
Basophils Absolute: 0.1 10*3/uL (ref 0.0–0.1)
Basophils Relative: 1 %
Eosinophils Absolute: 0.5 10*3/uL (ref 0.0–0.5)
Eosinophils Relative: 6 %
HCT: 37.4 % (ref 36.0–46.0)
Hemoglobin: 12.9 g/dL (ref 12.0–15.0)
Immature Granulocytes: 0 %
Lymphocytes Relative: 38 %
Lymphs Abs: 2.7 10*3/uL (ref 0.7–4.0)
MCH: 31.3 pg (ref 26.0–34.0)
MCHC: 34.5 g/dL (ref 30.0–36.0)
MCV: 90.8 fL (ref 80.0–100.0)
Monocytes Absolute: 0.7 10*3/uL (ref 0.1–1.0)
Monocytes Relative: 9 %
Neutro Abs: 3.3 10*3/uL (ref 1.7–7.7)
Neutrophils Relative %: 46 %
Platelets: 166 10*3/uL (ref 150–400)
RBC: 4.12 MIL/uL (ref 3.87–5.11)
RDW: 14.3 % (ref 11.5–15.5)
WBC: 7.2 10*3/uL (ref 4.0–10.5)
nRBC: 0 % (ref 0.0–0.2)

## 2022-06-17 LAB — COMPREHENSIVE METABOLIC PANEL
ALT: 31 U/L (ref 0–44)
AST: 43 U/L — ABNORMAL HIGH (ref 15–41)
Albumin: 4.4 g/dL (ref 3.5–5.0)
Alkaline Phosphatase: 64 U/L (ref 38–126)
Anion gap: 8 (ref 5–15)
BUN: 14 mg/dL (ref 8–23)
CO2: 30 mmol/L (ref 22–32)
Calcium: 9.3 mg/dL (ref 8.9–10.3)
Chloride: 101 mmol/L (ref 98–111)
Creatinine, Ser: 0.62 mg/dL (ref 0.44–1.00)
GFR, Estimated: >60 mL/min (ref >60)
Glucose, Bld: 137 mg/dL — ABNORMAL HIGH (ref 70–99)
Potassium: 3.6 mmol/L (ref 3.5–5.1)
Sodium: 139 mmol/L (ref 135–145)
Total Bilirubin: 0.7 mg/dL (ref 0.3–1.2)
Total Protein: 8.5 g/dL — ABNORMAL HIGH (ref 6.5–8.1)

## 2022-06-17 LAB — TSH: TSH: 0.357 u[IU]/mL (ref 0.350–4.500)

## 2022-06-17 MED ORDER — SODIUM CHLORIDE 0.9 % IV SOLN
Freq: Once | INTRAVENOUS | Status: AC
  Filled 2022-06-17: qty 250

## 2022-06-17 MED ORDER — HEPARIN SOD (PORK) LOCK FLUSH 100 UNIT/ML IV SOLN
500.0000 [IU] | Freq: Once | INTRAVENOUS | Status: AC | PRN
  Administered 2022-06-17: 500 [IU]
  Filled 2022-06-17: qty 5

## 2022-06-17 MED ORDER — SODIUM CHLORIDE 0.9 % IV SOLN
1200.0000 mg | Freq: Once | INTRAVENOUS | Status: AC
  Administered 2022-06-17: 1200 mg via INTRAVENOUS
  Filled 2022-06-17: qty 20

## 2022-06-17 MED ORDER — LORAZEPAM 1 MG PO TABS: ORAL_TABLET | ORAL | 0 refills | Status: DC

## 2022-06-17 NOTE — Patient Instructions (Signed)
[facility]  Discharge Instructions: Thank you for choosing [facility] to provide your oncology and hematology care.  If you have a lab appointment with the Cancer Center, please go directly to the Cancer Center and check in at the registration area.  Wear comfortable clothing and clothing appropriate for easy access to any Portacath or PICC line.   We strive to give you quality time with your provider. You may need to reschedule your appointment if you arrive late (15 or more minutes).  Arriving late affects you and other patients whose appointments are after yours.  Also, if you miss three or more appointments without notifying the office, you may be dismissed from the clinic at the provider's discretion.      For prescription refill requests, have your pharmacy contact our office and allow 72 hours for refills to be completed.       To help prevent nausea and vomiting after your treatment, we encourage you to take your nausea medication as directed.  BELOW ARE SYMPTOMS THAT SHOULD BE REPORTED IMMEDIATELY: *FEVER GREATER THAN 100.4 F (38 C) OR HIGHER *CHILLS OR SWEATING *NAUSEA AND VOMITING THAT IS NOT CONTROLLED WITH YOUR NAUSEA MEDICATION *UNUSUAL SHORTNESS OF BREATH *UNUSUAL BRUISING OR BLEEDING *URINARY PROBLEMS (pain or burning when urinating, or frequent urination) *BOWEL PROBLEMS (unusual diarrhea, constipation, pain near the anus) TENDERNESS IN MOUTH AND THROAT WITH OR WITHOUT PRESENCE OF ULCERS (sore throat, sores in mouth, or a toothache) UNUSUAL RASH, SWELLING OR PAIN  UNUSUAL VAGINAL DISCHARGE OR ITCHING   Items with * indicate a potential emergency and should be followed up as soon as possible or go to the Emergency Department if any problems should occur.  Please show the CHEMOTHERAPY ALERT CARD or IMMUNOTHERAPY ALERT CARD at check-in to the Emergency Department and triage nurse.  Should you have questions after your  visit or need to cancel or reschedule your appointment, please contact [facility]  [phone] and follow the prompts.  Office hours are 8:00 a.m. to 4:30 p.m. Monday - Friday. Please note that voicemails left after 4:00 p.m. may not be returned until the following business day.  We are closed weekends and major holidays. You have access to a nurse at all times for urgent questions. Please call the main number to the clinic [phone] and follow the prompts.  For any non-urgent questions, you may also contact your provider using MyChart. We now offer e-Visits for anyone 18 and older to request care online for non-urgent symptoms. For details visit [email or web address].   Also download the MyChart app! Go to the app store, search "MyChart", open the app, select [facility], and log in with your MyChart username and password.  Masks are optional in the cancer centers. If you would like for your care team to wear a mask while they are taking care of you, please let them know. For doctor visits, patients may have with them one support person who is at least 69 years old. At this time, visitors are not allowed in the infusion area.   

## 2022-06-17 NOTE — Assessment & Plan Note (Signed)
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 Currently s/p adjuvant carbo-taxol x4 [poor candidate for cisplatin].  PD-L1-5%; MARCH 15th, 2023-no evidence of any recurrent disease; stable mediastinal lymph node/previously PET negative; fibrotic/bronchitic changes noted bilateral lower lobes [ex-smoker]; currently on adjuvant Atezolizumab. STABLE.  Multiple interruptions given patient's overall general co-morbidities-unrelated to treatment.  # Continue Adjuvant Atezoluzimab 7. Labs today reviewed;  acceptable for treatment today. Labs today reviewed;  acceptable for treatment today.  STABLE.   # Hypokalemia: Off hydrochlorothiazide improved 3.6 potassium today.  Restart- hydrochlorothiazide monitor potassium closely.STABLE.   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 continue-vit D 50,000-STABLE.    # chronic back pain/chronic left LE/Bil PN-history of cervical spine injury-no evidence of metastatic disease.  Continue hydrocodone [for now as per Dr. Doy Hutching. STABLE.    # History of diabetes- continue metformin 500 mg BID; [place] 2023-Hb A1c-Hb A1C-5.9]. STABLE.    # Depression?anxiety-chronic; compounded by poor social support. DECLINES psychiatry evaluation. STABLE.    # Iatrogenic Hypothyroidism  [AUG 2023]-TSH-100. Currently on Synthroid 150 mcg/day-; awaiting Thyroid testing from today.   # Headaches- check MRI Brain ASAP; rule out hypophysitis .   # DISPOSITION:  # Brain MRI ASAP # proceed with Atezoluzumab;  # needs AM appt for labs- follow up in 3 weeks MD; port/ labs- cbc/cmp; cortisol; ACTH;; Atzeoluzimab; - Dr.B  .  Cc: Dr.A/Dr.Sparks

## 2022-06-17 NOTE — Progress Notes (Unsigned)
Pt states she has questions she will ask you.

## 2022-06-17 NOTE — Progress Notes (Unsigned)
[facility] CONSULT NOTE  Patient Care Team: Sparks, Jeffrey D, MD as PCP - General (Internal Medicine) Rhode, Hayley, RN as Oncology Nurse Navigator Brahmanday, Govinda R, MD as Consulting Physician (Oncology)  CHIEF COMPLAINTS/PURPOSE OF CONSULTATION: lung cancer  #  Oncology History Overview Note  # RUL-squamous cell carcinoma [s/p bronchoscopy;Dr.A] 1. Spiculated mass of the posterior right pulmonary apex measuring 3.2 x 2.8 cm, consistent with primary lung malignancy. 2. Enlarged right hilar and pretracheal lymph nodes measuring up to 1.6 x 1.5 cm, suspicious for nodal metastatic disease. 3. Recommend multidisciplinary thoracic referral for consideration of PET-CT metabolic characterization and tissue sampling. 4. Background of very fine centrilobular nodularity throughout the lungs, most concentrated at the apices. Scattered bronchiolar plugging. Findings are most consistent with smoking-related respiratory bronchiolitis. 5. Coarse, nodular contour of the liver in the included upper abdomen, suggestive of cirrhosis. 6. Coronary artery disease.  # COPD-  # DM- diet controlled; chronic back pain- hydrocodone 5 day; chronic left lower extremity weakness.  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 LYMPH NODE, HILAR, EXCISION:  - Lymph node negative for metastatic carcinoma.   B. LYMPH NODE, LEVEL 4, EXCISION:  - Lymph node negative for metastatic carcinoma.   C. LUNG, RIGHT UPPER LOBE, LOBECTOMY:  - Squamous cell carcinoma, 4.5 cm.  - Carcinoma extends through visceral pleura and focally involves  parietal pleura.  - Surgical margins negative for carcinoma.  - One lymph node negative for carcinoma.  - See oncology table and comment.   D. SOFT  TISSUE, CHEST WALL, BIOPSY:  - Fibroadipose tissue with focal inflammation and fibrosis.  - No carcinoma identified.   ONCOLOGY TABLE:  LUNG: Resection  Synchronous Tumors: Not applicable.  Total Number of Primary Tumors: 1  Procedure: Right upper lobectomy with lymph node biopsies and chest wall  biopsy.  Specimen Laterality: Right upper lobe.  Tumor Focality: Unifocal.  Tumor Site: Right upper lobe.  Tumor Size: 4.5 x 4 x 3 cm.  Histologic Type: Squamous cell carcinoma.  Visceral Pleura Invasion: Present.  Direct Invasion of Adjacent Structures: Focal involvement of parietal  pleura.  Lymphovascular Invasion: Not identified.  Margins: All surgical margins negative for carcinoma.  Regional Lymph Nodes:       Number of Lymph Nodes Involved: 0                            Nodal Sites with Tumor: 0       Number of Lymph Nodes Examined: 3                       Nodal Sites Examined: 2  Pathologic Stage Classification (pTNM, AJCC 8th Edition): pT3, pN0  Ancillary Studies: Can be performed if requested.   #Right upper lobe stage IIb-pT3 [not cisplatin candidate]; CarboTaxol x4. April 11th, 2023-Atezo q 3 w x1 year  # AUG 2023- IATROGENIC HYPOTHYRODISM- TSH 100; START SYNTHROID   Malignant neoplasm of upper lobe of right lung (HCC)  04/24/2021 Initial Diagnosis   Malignant neoplasm of upper lobe of right lung (HCC)   05/02/2021 Cancer Staging   Staging form: Lung, AJCC 8th Edition -   Clinical: Stage IB (cT2a, cN0, cM0) - Signed by Brahmanday, Govinda R, MD on 05/02/2021 Stage prefix: Initial diagnosis   07/23/2021 Cancer Staging   Staging form: Lung, AJCC 8th Edition - Pathologic: Stage IIB (pT3, pN0, cM0) - Signed by Brahmanday, Govinda R, MD on 07/23/2021 Stage prefix: Initial diagnosis    HISTORY OF PRESENTING ILLNESS: Patient ambulating independently.  Alone.   Olivia Buchanan 71 y.o.  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  Patient was taken off her hydrochlorothiazide diuretic because of borderline low blood pressures.   However patient noted to have increased weight gain in the last couple of months.  No diarrhea.  Complains of extreme fatigue.  Feels poorly complains of worsening myalgias.  Mild nausea no vomiting.  No worsening tingling or numbness.  Chronic back pain.  Chronic difficulty breathing.  No headaches.  No chest pain.  Review of Systems  Constitutional:  Positive for malaise/fatigue and weight loss. Negative for chills, diaphoresis and fever.  HENT:  Negative for nosebleeds and sore throat.   Eyes:  Negative for double vision.  Respiratory:  Positive for cough and shortness of breath. Negative for hemoptysis and wheezing.   Cardiovascular:  Negative for chest pain, palpitations, orthopnea and leg swelling.  Gastrointestinal:  Negative for abdominal pain, blood in stool, constipation, diarrhea, heartburn, melena and vomiting.  Genitourinary:  Negative for dysuria, frequency and urgency.  Musculoskeletal:  Positive for back pain, joint pain and neck pain.  Skin: Negative.  Negative for itching and rash.  Neurological:  Negative for tingling, focal weakness and weakness.  Endo/Heme/Allergies:  Does not bruise/bleed easily.  Psychiatric/Behavioral:  Negative for depression. The patient is not nervous/anxious and does not have insomnia.      MEDICAL HISTORY:  Past Medical History:  Diagnosis Date   Angioedema 12/08/2019   Anxiety    Aortic atherosclerosis (HCC)    Asthma    CAD (coronary artery disease)    3 vessel   Cancer (HCC)    COPD (chronic obstructive pulmonary disease) (HCC) 12/08/2019   DDD (degenerative disc disease), lumbar    Depression    Diabetes mellitus without complication (HCC)    no meds, diet controlled   Dyspnea    GERD (gastroesophageal reflux disease)    HTN (hypertension) 12/08/2019   Hyperlipidemia    Hypertension    Mass of upper lobe of  right lung 04/02/2021   spiculated mass RIGHT posterior pulmonary apex with associated hilar and pretracheal LAD; measured 3.2 x 2.8 cm   Obesity (BMI 30-39.9)    T2DM (type 2 diabetes mellitus) (HCC) 12/08/2019    SURGICAL HISTORY: Past Surgical History:  Procedure Laterality Date   BACK SURGERY     lumbar L5-S1 ruptured disc x 3 surgeries   CERVICAL FUSION  2009   FINE NEEDLE ASPIRATION  06/23/2021   Procedure: FINE NEEDLE ASPIRATION (FNA) LINEAR;  Surgeon: Icard, Bradley L, DO;  Location: [facility];  Service: Pulmonary;;   INTERCOSTAL NERVE BLOCK Right 07/09/2021   Procedure: INTERCOSTAL NERVE BLOCK;  Surgeon: Lightfoot, Harrell O, MD;  Location: [facility] OR;  Service: Thoracic;  Laterality: Right;   IR IMAGING GUIDED PORT INSERTION  08/06/2021   LAPAROSCOPIC TOTAL HYSTERECTOMY  1998   with oophorectomy   LYMPH NODE DISSECTION Right 07/09/2021   Procedure: LYMPH NODE DISSECTION;  Surgeon: Lightfoot, Harrell O, MD;  Location: [facility] OR;  Service: Thoracic;  Laterality: Right;   TONSILLECTOMY     VIDEO BRONCHOSCOPY   WITH ENDOBRONCHIAL NAVIGATION N/A 04/15/2021   Procedure: VIDEO BRONCHOSCOPY WITH ENDOBRONCHIAL NAVIGATION;  Surgeon: Ottie Glazier, MD;  Location: [facility];  Service: Thoracic;  Laterality: N/A;   VIDEO BRONCHOSCOPY WITH ENDOBRONCHIAL ULTRASOUND N/A 04/15/2021   Procedure: VIDEO BRONCHOSCOPY WITH ENDOBRONCHIAL ULTRASOUND;  Surgeon: Ottie Glazier, MD;  Location: [facility];  Service: Thoracic;  Laterality: N/A;   VIDEO BRONCHOSCOPY WITH ENDOBRONCHIAL ULTRASOUND N/A 06/23/2021   Procedure: VIDEO BRONCHOSCOPY WITH ENDOBRONCHIAL ULTRASOUND;  Surgeon: Garner Nash, DO;  Location: [facility];  Service: Pulmonary;  Laterality: N/A;    SOCIAL HISTORY: Social History   Socioeconomic History   Marital status: Married    Spouse name: Dwight   Number of children: 3   Years of education: Not on file   Highest education level: Not on file  Occupational History   Not on file  Tobacco  Use   Smoking status: Former    Packs/day: 3.00    Years: 50.00    Total pack years: 150.00    Types: Cigarettes    Quit date: 2019    Years since quitting: 4.7   Smokeless tobacco: Never  Vaping Use   Vaping Use: Never used  Substance and Sexual Activity   Alcohol use: Not Currently   Drug use: Never   Sexual activity: Not on file    Comment: Hysterectomy  Other Topics Concern   Not on file  Social History Narrative   Lives in home; with husband; lives in [place]; never smoking 2019; no alcohol. 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    Social Determinants of Health   Financial Resource Strain: Not on file  Food Insecurity: Not on file  Transportation Needs: Not on file  Physical Activity: Not on file  Stress: Not on file  Social Connections: Not on file  Intimate Partner Violence: Not on file    FAMILY HISTORY: Family History  Problem Relation Age of Onset   Bone cancer Maternal Uncle     ALLERGIES:  is allergic to ibuprofen and macrobid [nitrofurantoin].  MEDICATIONS:  Current Outpatient Medications  Medication Sig Dispense Refill   albuterol (VENTOLIN HFA) 108 (90 Base) MCG/ACT inhaler Inhale 1 puff into the lungs every 6 (six) hours as needed for wheezing.     aspirin EC 81 MG tablet Take 81 mg by mouth at bedtime.     Biotin 1000 MCG tablet Take 1,000 mcg by mouth 3 (three) times daily.     clonazePAM (KLONOPIN) 0.5 MG tablet Take 0.5 mg by mouth in the morning, at noon, and at bedtime.     diphenoxylate-atropine (LOMOTIL) 2.5-0.025 MG tablet Take by mouth.     EPINEPHrine 0.3 mg/0.3 mL IJ SOAJ injection Inject 0.3 mLs (0.3 mg total) into the muscle as needed for anaphylaxis. 2 each 0   ergocalciferol (VITAMIN D2) 1.25 MG (50000 UT) capsule Take 1 capsule (50,000 Units total) by mouth once a week. 12 capsule 1   fluticasone-salmeterol (ADVAIR) 250-50 MCG/ACT AEPB Inhale 1 puff into the lungs in the morning and at bedtime.     gabapentin  (NEURONTIN) 300 MG capsule Take 300 mg by mouth 3 (three) times daily. Take 300 mg qam and lunch and take 600 mg qhs     hydrochlorothiazide (HYDRODIURIL) 25 MG tablet Take 25 mg by mouth daily.     HYDROcodone-acetaminophen (NORCO) 10-325 MG tablet Take 1 tablet by mouth every 6 (six) hours as needed.     ipratropium-albuterol (DUONEB) 0.5-2.5 (3) MG/3ML SOLN Take 3 mLs  by nebulization 3 (three) times daily.     levothyroxine (SYNTHROID) 150 MCG tablet Take 1 tablet (150 mcg total) by mouth daily before breakfast. Take 30- 60 mins prior to break fast; on empty stomach. 30 tablet 3   lidocaine-prilocaine (EMLA) cream Apply topically once.     LORazepam (ATIVAN) 1 MG tablet Take 1 to 2 pills 30 to 60 minutes before MRI.  And 1 more if needed just prior to MRI. 3 tablet 0   Melatonin 10 MG TABS Take 10 mg by mouth at bedtime.     metFORMIN (GLUCOPHAGE) 500 MG tablet Take 1 tablet (500 mg total) by mouth 2 (two) times daily with a meal. 60 tablet 3   Multiple Vitamin (MULTI-VITAMIN) tablet Take 1 tablet by mouth at bedtime.     nystatin cream (MYCOSTATIN) Apply 1 application topically 2 (two) times daily as needed for dry skin (around lips).     pantoprazole (PROTONIX) 40 MG tablet Take 40 mg by mouth at bedtime.     potassium chloride SA (KLOR-CON) 20 MEQ tablet Take 20 mEq by mouth 2 (two) times daily.     pravastatin (PRAVACHOL) 80 MG tablet Take 80 mg by mouth at bedtime.     prochlorperazine (COMPAZINE) 10 MG tablet Take 1 tablet (10 mg total) by mouth every 6 (six) hours as needed (Nausea or vomiting). 30 tablet 1   tiZANidine (ZANAFLEX) 4 MG tablet Take 4 mg by mouth 3 (three) times daily.     traZODone (DESYREL) 100 MG tablet Take 50 mg by mouth at bedtime.     venlafaxine XR (EFFEXOR-XR) 75 MG 24 hr capsule Take 150 mg by mouth at bedtime.     docusate sodium (COLACE) 100 MG capsule Take 200 mg by mouth at bedtime. (Patient not taking: Reported on 04/15/2022)     No current  facility-administered medications for this visit.      .  PHYSICAL EXAMINATION: ECOG PERFORMANCE STATUS: 1 - Symptomatic but completely ambulatory  Vitals:   06/17/22 0948  BP: 132/81  Pulse: 93  Temp: (!) 97.2 F (36.2 C)  SpO2: 94%   Filed Weights   06/17/22 0948  Weight: 178 lb 9.6 oz (81 kg)    Physical Exam Vitals and nursing note reviewed.  HENT:     Head: Normocephalic and atraumatic.     Mouth/Throat:     Mouth: Mucous membranes are moist.     Pharynx: Oropharynx is clear. No oropharyngeal exudate.  Eyes:     Extraocular Movements: Extraocular movements intact.     Pupils: Pupils are equal, round, and reactive to light.  Cardiovascular:     Rate and Rhythm: Normal rate and regular rhythm.  Pulmonary:     Effort: No respiratory distress.     Breath sounds: No wheezing.     Comments: Decreased breath sounds bilaterally at bases.  No wheeze or crackles Abdominal:     General: Bowel sounds are normal. There is no distension.     Palpations: Abdomen is soft. There is no mass.     Tenderness: There is no abdominal tenderness. There is no guarding or rebound.  Musculoskeletal:        General: No tenderness. Normal range of motion.     Cervical back: Normal range of motion and neck supple.  Skin:    General: Skin is warm.  Neurological:     General: No focal deficit present.     Mental Status: She is alert and oriented to person,   place, and time.     Comments: 4-5 strength in the left lower extremity/chronic.  Psychiatric:        Mood and Affect: Affect normal.        Behavior: Behavior normal.        Judgment: Judgment normal.      LABORATORY DATA:  I have reviewed the data as listed Lab Results  Component Value Date   WBC 7.2 06/17/2022   HGB 12.9 06/17/2022   HCT 37.4 06/17/2022   MCV 90.8 06/17/2022   PLT 166 06/17/2022   Recent Labs    04/15/22 0913 05/27/22 0915 06/17/22 0932  NA 137 137 139  K 3.7 4.4 3.6  CL 106 103 101  CO2 _0 GLUCOSE 143* 121* 137*  BUN _1 CREATININE 0.70 0.99 0.62  CALCIUM 8.7* 9.0 9.3  GFRNONAA >60 >60 >60  PROT 7.0 7.5 8.5*  ALBUMIN 3.6 4.1 4.4  AST 35 42* 43*  ALT _2 ALKPHOS 53 60 64  BILITOT 0.6 0.4 0.7    RADIOGRAPHIC STUDIES: I have personally reviewed the radiological images as listed and agreed with the findings in the report. No results found.  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 Currently s/p adjuvant carbo-taxol x4 [poor candidate for cisplatin].  PD-L1-5%; MARCH 15th, 2023-no evidence of any recurrent disease; stable mediastinal lymph node/previously PET negative; fibrotic/bronchitic changes noted bilateral lower lobes [ex-smoker]; currently on adjuvant Atezolizumab. STABLE.  Multiple interruptions given patient's overall general co-morbidities-unrelated to treatment.  # Continue Adjuvant Atezoluzimab 7. Labs today reviewed;  acceptable for treatment today. Labs today reviewed;  acceptable for treatment today.  STABLE.   # Hypokalemia: Off hydrochlorothiazide improved 3.6 potassium today.  Restart- hydrochlorothiazide monitor potassium closely.STABLE.   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 continue-vit D 50,000-STABLE.    # chronic back pain/chronic left LE/Bil PN-history of cervical spine injury-no evidence of metastatic disease.  Continue hydrocodone [for now as per Dr. Doy Hutching. STABLE.    # History of diabetes- continue metformin 500 mg BID; [place] 2023-Hb A1c-Hb A1C-5.9]. STABLE.    # Depression?anxiety-chronic; compounded by poor social support. DECLINES psychiatry evaluation. STABLE.    # Iatrogenic Hypothyroidism  [AUG 2023]-TSH-100. Currently on Synthroid 150 mcg/day-; awaiting Thyroid testing from today.   # Headaches- check MRI Brain ASAP; rule  out hypophysitis .   # DISPOSITION:  # Brain MRI ASAP # proceed with Atezoluzumab;  # needs AM appt for labs- follow up in 3 weeks MD; port/ labs- cbc/cmp; cortisol; ACTH;; Atzeoluzimab; - Dr.B  .  Cc: Dr.A/Dr.Sparks        All questions were answered. The patient knows to call the clinic with any problems, questions or concerns.       Cammie Sickle, MD 06/17/2022 10:29 AM

## 2022-06-18 ENCOUNTER — Other Ambulatory Visit: Payer: Self-pay

## 2022-06-18 ENCOUNTER — Encounter: Payer: Self-pay | Admitting: Internal Medicine

## 2022-06-18 ENCOUNTER — Telehealth: Payer: Self-pay

## 2022-06-18 ENCOUNTER — Other Ambulatory Visit: Payer: Self-pay | Admitting: Internal Medicine

## 2022-06-18 LAB — THYROID PANEL WITH TSH
Free Thyroxine Index: 5 — ABNORMAL HIGH (ref 1.2–4.9)
T3 Uptake Ratio: 33 % (ref 24–39)
T4, Total: 15.2 ug/dL — ABNORMAL HIGH (ref 4.5–12.0)
TSH: 0.354 u[IU]/mL — ABNORMAL LOW (ref 0.450–4.500)

## 2022-06-18 MED ORDER — LEVOTHYROXINE SODIUM 125 MCG PO TABS: 125.0000 ug | ORAL_TABLET | Freq: Every day | ORAL | 0 refills | Status: DC

## 2022-06-18 NOTE — Telephone Encounter (Signed)
Patient notified and rx sent.

## 2022-06-18 NOTE — Telephone Encounter (Signed)
-----   Message from Cammie Sickle, MD sent at 06/18/2022 12:45 PM EDT ----- Please inform patient that based on her thyroid numbers from yesterday I would like to decrease the dose of Synthroid.  Stop taking current dose of synthroid.   please Send script fir 125 mg once a day; #30.  No refills.   Thanks,  GB

## 2022-06-19 ENCOUNTER — Ambulatory Visit: Admission: RE | Admit: 2022-06-19 | Payer: Medicare HMO | Source: Ambulatory Visit

## 2022-06-24 ENCOUNTER — Other Ambulatory Visit: Payer: Self-pay

## 2022-07-05 ENCOUNTER — Other Ambulatory Visit: Payer: Self-pay

## 2022-07-09 ENCOUNTER — Inpatient Hospital Stay: Payer: Medicare HMO | Admitting: Internal Medicine

## 2022-07-09 ENCOUNTER — Inpatient Hospital Stay: Payer: Medicare HMO

## 2022-07-09 ENCOUNTER — Telehealth: Payer: Self-pay | Admitting: Internal Medicine

## 2022-07-09 NOTE — Telephone Encounter (Signed)
Answering service relayed message from patient to cancel all appointment on 07/09/2022. She is not feeling well. I relayed message to MD, nurse scheduler and infusion.

## 2022-07-09 NOTE — Telephone Encounter (Signed)
Called and left patient a message to see when she could come for her treatment. Left voice mail and will wait for call back to let Dr. B know when patient would like to come in.

## 2022-07-09 NOTE — Assessment & Plan Note (Deleted)
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 Currently s/p adjuvant carbo-taxol x4 [poor candidate for cisplatin].  PD-L1-5%; MARCH 15th, 2023-no evidence of any recurrent disease; stable mediastinal lymph node/previously PET negative; fibrotic/bronchitic changes noted bilateral lower lobes [ex-smoker]; currently on adjuvant Atezolizumab. STABLE.  Multiple interruptions given patient's overall general co-morbidities-unrelated to treatment. STABLE.  # Continue Adjuvant Atezoluzimab # 7. Labs today reviewed;  acceptable for treatment today. Labs today reviewed;  acceptable for treatment today.  Patient tolerating treatment with mild to moderate difficulties-see below hypothyroidism     # Hypokalemia: Off hydrochlorothiazide improved 3.6 potassium today.  Restart- hydrochlorothiazide monitor potassium closely.STABLE.   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 continue-vit D 50,000-STABLE.    # chronic back pain/chronic left LE/Bil PN-history of cervical spine injury-no evidence of metastatic disease.  Continue hydrocodone [for now as per Dr. Doy Hutching. STABLE.    # Iatrogenic Hypothyroidism  [AUG 2023]-TSH-100. Currently on Synthroid 150 mcg/day-; awaiting Thyroid testing from today.  Please take approximately 4 to 6 weeks to thyroid levels to adjust.  # Headaches/ongoing fatigue- check MRI Brain ASAP; rule out hypophysitis; check cortisol ACTH in a.m. at next visit.  # History of diabetes- continue metformin 500 mg BID; [place] 2023-Hb A1c-Hb A1C-5.9]. STABLE.    # Depression?anxiety-chronic; compounded by poor social support. DECLINES psychiatry evaluation. STABLE.    # DISPOSITION:  # Brain MRI ASAP # proceed with Atezoluzumab;  # needs AM appt for labs- follow up in 3 weeks MD; port/ labs- cbc/cmp; cortisol; ACTH;; Atzeoluzimab; - Dr.B  .  Cc: Dr.A/Dr.Sparks

## 2022-07-09 NOTE — Telephone Encounter (Signed)
Please see below, When would you like to reschedule?

## 2022-07-12 NOTE — Telephone Encounter (Signed)
R/S MRI as well.  Last MD check out was for for MRI ASAP.

## 2022-07-12 NOTE — Telephone Encounter (Signed)
Pt called back to rescheduled appt from Friday. She would like a Thursday appt. I noticed she was supposed to have an MRI before her appt and it looks like she no showed to that as well.

## 2022-07-13 ENCOUNTER — Telehealth: Payer: Self-pay | Admitting: *Deleted

## 2022-07-13 ENCOUNTER — Other Ambulatory Visit: Payer: Self-pay | Admitting: Internal Medicine

## 2022-07-13 ENCOUNTER — Other Ambulatory Visit: Payer: Self-pay

## 2022-07-13 DIAGNOSIS — C3411 Malignant neoplasm of upper lobe, right bronchus or lung: Secondary | ICD-10-CM

## 2022-07-13 NOTE — Progress Notes (Signed)
Cancel MRI as patient declines claustrophobic.   Ordered CT brain with and without contrast.  Please schedule ASAP

## 2022-07-13 NOTE — Telephone Encounter (Signed)
Call returned to patient and she is needing to get her appts from 07/09/22 r/s.  Also needing a refill on Levothyroxine (only has 6 pills left).

## 2022-07-13 NOTE — Telephone Encounter (Signed)
Next scheduled lab/MD/tx later this month.

## 2022-07-13 NOTE — Telephone Encounter (Signed)
Dr. Jacinto Reap, pt refused MRI how would you like to proceed?  9/14 MD note:  Headaches/ongoing fatigue- check MRI Brain ASAP; rule out hypophysitis; check cortisol ACTH in a.m. at next visit.

## 2022-07-13 NOTE — Telephone Encounter (Signed)
Patient called to report that she was unable to make her MRI appointment due to anxiety. She would like to speak with care team member about possible alternatives and she needs medication refills.

## 2022-07-14 ENCOUNTER — Encounter: Payer: Self-pay | Admitting: Internal Medicine

## 2022-07-14 ENCOUNTER — Other Ambulatory Visit: Payer: Self-pay | Admitting: Internal Medicine

## 2022-07-14 MED ORDER — LEVOTHYROXINE SODIUM 125 MCG PO TABS: 125.0000 ug | ORAL_TABLET | Freq: Every day | ORAL | 0 refills | Status: DC

## 2022-07-15 ENCOUNTER — Ambulatory Visit
Admission: RE | Admit: 2022-07-15 | Discharge: 2022-07-15 | Disposition: A | Discharge status: Home / Self Care | Payer: Medicare HMO | Source: Ambulatory Visit | Attending: Internal Medicine | Admitting: Internal Medicine

## 2022-07-15 ENCOUNTER — Other Ambulatory Visit: Payer: Medicare HMO

## 2022-07-15 DIAGNOSIS — C3411 Malignant neoplasm of upper lobe, right bronchus or lung: Secondary | ICD-10-CM | POA: Diagnosis not present

## 2022-07-15 DIAGNOSIS — R519 Headache, unspecified: Secondary | ICD-10-CM | POA: Diagnosis not present

## 2022-07-15 DIAGNOSIS — I6381 Other cerebral infarction due to occlusion or stenosis of small artery: Secondary | ICD-10-CM | POA: Diagnosis not present

## 2022-07-15 MED ORDER — IOHEXOL 300 MG/ML  SOLN
75.0000 mL | Freq: Once | INTRAMUSCULAR | Status: AC | PRN
  Administered 2022-07-15: 75 mL via INTRAVENOUS

## 2022-07-16 ENCOUNTER — Other Ambulatory Visit: Payer: Self-pay

## 2022-07-16 ENCOUNTER — Telehealth: Payer: Self-pay | Admitting: Internal Medicine

## 2022-07-16 NOTE — Telephone Encounter (Signed)
Pt left a VM to reschedule appts with Dr. Jacinto Reap. Looks like she has workque requests in , was not sure if those needed to be changed or not.

## 2022-07-19 ENCOUNTER — Other Ambulatory Visit: Payer: Self-pay | Admitting: Internal Medicine

## 2022-07-21 ENCOUNTER — Other Ambulatory Visit: Payer: Self-pay

## 2022-07-22 ENCOUNTER — Other Ambulatory Visit: Payer: Self-pay

## 2022-07-23 ENCOUNTER — Inpatient Hospital Stay: Payer: Medicare HMO | Attending: Internal Medicine | Admitting: Internal Medicine

## 2022-07-23 ENCOUNTER — Inpatient Hospital Stay: Payer: Medicare HMO

## 2022-07-23 ENCOUNTER — Encounter: Payer: Self-pay | Admitting: Internal Medicine

## 2022-07-23 VITALS — BP 107/58 | HR 84 | Temp 99.1°F | Resp 16 | Ht 59.0 in | Wt 179.1 lb

## 2022-07-23 DIAGNOSIS — Z7984 Long term (current) use of oral hypoglycemic drugs: Secondary | ICD-10-CM | POA: Insufficient documentation

## 2022-07-23 DIAGNOSIS — Z7951 Long term (current) use of inhaled steroids: Secondary | ICD-10-CM | POA: Diagnosis not present

## 2022-07-23 DIAGNOSIS — Z5112 Encounter for antineoplastic immunotherapy: Secondary | ICD-10-CM | POA: Diagnosis not present

## 2022-07-23 DIAGNOSIS — I10 Essential (primary) hypertension: Secondary | ICD-10-CM | POA: Insufficient documentation

## 2022-07-23 DIAGNOSIS — E119 Type 2 diabetes mellitus without complications: Secondary | ICD-10-CM | POA: Diagnosis not present

## 2022-07-23 DIAGNOSIS — G8929 Other chronic pain: Secondary | ICD-10-CM | POA: Insufficient documentation

## 2022-07-23 DIAGNOSIS — E039 Hypothyroidism, unspecified: Secondary | ICD-10-CM | POA: Diagnosis not present

## 2022-07-23 DIAGNOSIS — F32A Depression, unspecified: Secondary | ICD-10-CM | POA: Insufficient documentation

## 2022-07-23 DIAGNOSIS — Z87891 Personal history of nicotine dependence: Secondary | ICD-10-CM | POA: Insufficient documentation

## 2022-07-23 DIAGNOSIS — D491 Neoplasm of unspecified behavior of respiratory system: Secondary | ICD-10-CM

## 2022-07-23 DIAGNOSIS — Z7982 Long term (current) use of aspirin: Secondary | ICD-10-CM | POA: Insufficient documentation

## 2022-07-23 DIAGNOSIS — C3411 Malignant neoplasm of upper lobe, right bronchus or lung: Secondary | ICD-10-CM

## 2022-07-23 DIAGNOSIS — E785 Hyperlipidemia, unspecified: Secondary | ICD-10-CM | POA: Insufficient documentation

## 2022-07-23 DIAGNOSIS — Z79899 Other long term (current) drug therapy: Secondary | ICD-10-CM | POA: Diagnosis not present

## 2022-07-23 DIAGNOSIS — F419 Anxiety disorder, unspecified: Secondary | ICD-10-CM | POA: Insufficient documentation

## 2022-07-23 DIAGNOSIS — M5030 Other cervical disc degeneration, unspecified cervical region: Secondary | ICD-10-CM | POA: Insufficient documentation

## 2022-07-23 LAB — COMPREHENSIVE METABOLIC PANEL
ALT: 26 U/L (ref 0–44)
AST: 34 U/L (ref 15–41)
Albumin: 3.7 g/dL (ref 3.5–5.0)
Alkaline Phosphatase: 51 U/L (ref 38–126)
Anion gap: 8 (ref 5–15)
BUN: 14 mg/dL (ref 8–23)
CO2: 29 mmol/L (ref 22–32)
Calcium: 9 mg/dL (ref 8.9–10.3)
Chloride: 103 mmol/L (ref 98–111)
Creatinine, Ser: 0.7 mg/dL (ref 0.44–1.00)
GFR, Estimated: >60 mL/min (ref >60)
Glucose, Bld: 165 mg/dL — ABNORMAL HIGH (ref 70–99)
Potassium: 3.1 mmol/L — ABNORMAL LOW (ref 3.5–5.1)
Sodium: 140 mmol/L (ref 135–145)
Total Bilirubin: 0.5 mg/dL (ref 0.3–1.2)
Total Protein: 7.5 g/dL (ref 6.5–8.1)

## 2022-07-23 LAB — TSH: TSH: 0.027 u[IU]/mL — ABNORMAL LOW (ref 0.350–4.500)

## 2022-07-23 LAB — CBC WITH DIFFERENTIAL/PLATELET
Abs Immature Granulocytes: 0.01 10*3/uL (ref 0.00–0.07)
Basophils Absolute: 0.1 10*3/uL (ref 0.0–0.1)
Basophils Relative: 1 %
Eosinophils Absolute: 0.5 10*3/uL (ref 0.0–0.5)
Eosinophils Relative: 7 %
HCT: 36.5 % (ref 36.0–46.0)
Hemoglobin: 12.5 g/dL (ref 12.0–15.0)
Immature Granulocytes: 0 %
Lymphocytes Relative: 42 %
Lymphs Abs: 2.6 10*3/uL (ref 0.7–4.0)
MCH: 31.6 pg (ref 26.0–34.0)
MCHC: 34.2 g/dL (ref 30.0–36.0)
MCV: 92.4 fL (ref 80.0–100.0)
Monocytes Absolute: 0.5 10*3/uL (ref 0.1–1.0)
Monocytes Relative: 8 %
Neutro Abs: 2.6 10*3/uL (ref 1.7–7.7)
Neutrophils Relative %: 42 %
Platelets: 154 10*3/uL (ref 150–400)
RBC: 3.95 MIL/uL (ref 3.87–5.11)
RDW: 12.2 % (ref 11.5–15.5)
WBC: 6.3 10*3/uL (ref 4.0–10.5)
nRBC: 0 % (ref 0.0–0.2)

## 2022-07-23 MED ORDER — HEPARIN SOD (PORK) LOCK FLUSH 100 UNIT/ML IV SOLN
INTRAVENOUS | Status: AC
  Filled 2022-07-23: qty 5

## 2022-07-23 MED ORDER — HEPARIN SOD (PORK) LOCK FLUSH 100 UNIT/ML IV SOLN
500.0000 [IU] | Freq: Once | INTRAVENOUS | Status: AC | PRN
  Administered 2022-07-23: 500 [IU]
  Filled 2022-07-23: qty 5

## 2022-07-23 MED ORDER — SODIUM CHLORIDE 0.9 % IV SOLN
1200.0000 mg | Freq: Once | INTRAVENOUS | Status: AC
  Administered 2022-07-23: 1200 mg via INTRAVENOUS
  Filled 2022-07-23: qty 20

## 2022-07-23 MED ORDER — SODIUM CHLORIDE 0.9 % IV SOLN
Freq: Once | INTRAVENOUS | Status: AC
  Filled 2022-07-23: qty 250

## 2022-07-23 NOTE — Patient Instructions (Signed)
[facility] AT [place]  Discharge Instructions: Thank you for choosing [facility] to provide your oncology and hematology care.  If you have a lab appointment with the [facility], please go directly to the [facility] and check in at the registration area.  Wear comfortable clothing and clothing appropriate for easy access to any Portacath or PICC line.   We strive to give you quality time with your provider. You may need to reschedule your appointment if you arrive late (15 or more minutes).  Arriving late affects you and other patients whose appointments are after yours.  Also, if you miss three or more appointments without notifying the office, you may be dismissed from the clinic at the provider's discretion.      For prescription refill requests, have your pharmacy contact our office and allow 72 hours for refills to be completed.    Today you received the following chemotherapy and/or immunotherapy agents Tecentriq      To help prevent nausea and vomiting after your treatment, we encourage you to take your nausea medication as directed.  BELOW ARE SYMPTOMS THAT SHOULD BE REPORTED IMMEDIATELY: *FEVER GREATER THAN 100.4 F (38 C) OR HIGHER *CHILLS OR SWEATING *NAUSEA AND VOMITING THAT IS NOT CONTROLLED WITH YOUR NAUSEA MEDICATION *UNUSUAL SHORTNESS OF BREATH *UNUSUAL BRUISING OR BLEEDING *URINARY PROBLEMS (pain or burning when urinating, or frequent urination) *BOWEL PROBLEMS (unusual diarrhea, constipation, pain near the anus) TENDERNESS IN MOUTH AND THROAT WITH OR WITHOUT PRESENCE OF ULCERS (sore throat, sores in mouth, or a toothache) UNUSUAL RASH, SWELLING OR PAIN  UNUSUAL VAGINAL DISCHARGE OR ITCHING   Items with * indicate a potential emergency and should be followed up as soon as possible or go to the Emergency Department if any problems should occur.  Please show the CHEMOTHERAPY ALERT CARD or IMMUNOTHERAPY ALERT CARD at check-in to  the Emergency Department and triage nurse.  Should you have questions after your visit or need to cancel or reschedule your appointment, please contact [facility] CANCER [place] AT [place]  [phone] and follow the prompts.  Office hours are 8:00 a.m. to 4:30 p.m. Monday - Friday. Please note that voicemails left after 4:00 p.m. may not be returned until the following business day.  We are closed weekends and major holidays. You have access to a nurse at all times for urgent questions. Please call the main number to the clinic [phone] and follow the prompts.  For any non-urgent questions, you may also contact your provider using MyChart. We now offer e-Visits for anyone 69 and older to request care online for non-urgent symptoms. For details visit [email or web address].   Also download the MyChart app! Go to the app store, search "MyChart", open the app, select [facility], and log in with your MyChart username and password.  Masks are optional in the cancer centers. If you would like for your care team to wear a mask while they are taking care of you, please let them know. For doctor visits, patients may have with them one support person who is at least 69 years old. At this time, visitors are not allowed in the infusion area.

## 2022-07-23 NOTE — Assessment & Plan Note (Addendum)
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 Currently s/p adjuvant carbo-taxol x4 [poor candidate for cisplatin].  PD-L1-5%; MARCH 15th, 2023-no evidence of any recurrent disease; stable mediastinal lymph node/previously PET negative; fibrotic/bronchitic changes noted bilateral lower lobes [ex-smoker]; currently on adjuvant Atezolizumab. STABLE.  Multiple interruptions given patient's overall general co-morbidities-unrelated to treatment. STABLE.   # Continue Adjuvant Atezoluzimab # 8.  Labs today reviewed;  acceptable for treatment today. Labs today reviewed.  Patient tolerating treatment with mild to moderate difficulties-see below hypothyroidism     # Hypokalemia: Off hydrochlorothiazide improved 3.1 potassium today.  On Kdur BID; recommend every other day hydrochlorothiazide monitor potassium closely.STABLE.   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 continue-vit D 50,000-STABLE.    # chronic back pain/chronic left LE/Bil PN-history of cervical spine injury-no evidence of metastatic disease.  Continue hydrocodone [for now as per Dr. Doy Hutching. STABLE.    # Iatrogenic Hypothyroidism  [AUG 2023]-TSH-100. Currently on Synthroid 150 mcg/day-; SEP TSH 0.345.   Await TSH leveal from today.  # Headaches/ongoing fatigue- CT scan without and without contrast- rule out hypophysitis-negative for any acute process..  # History of diabetes- continue metformin 500 mg BID; [place] 2023-Hb A1c-Hb A1C-5.9]. STABLE.    # Depression?anxiety-chronic; compounded by poor social support. DECLINES psychiatry evaluation.  Status post social work evaluation.  STABLE.    # Bil LE- itchy eyes- recommend patanol; also defer to othplamology [Hx of glaucoma]  # DISPOSITION:  # proceed with Atezoluzumab today.  #  follow up in 3 weeks MD; port/ labs- cbc/cmp; Atzeoluzimab; - Dr.B  .  Cc: Dr.A/Dr.Sparks
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
## 2022-07-24 ENCOUNTER — Other Ambulatory Visit: Payer: Self-pay

## 2022-07-24 LAB — T4: T4, Total: 11.8 ug/dL (ref 4.5–12.0)

## 2022-07-26 ENCOUNTER — Other Ambulatory Visit: Payer: Self-pay

## 2022-07-27 ENCOUNTER — Encounter: Payer: Self-pay | Admitting: Licensed Clinical Social Worker

## 2022-07-27 DIAGNOSIS — C3400 Malignant neoplasm of unspecified main bronchus: Secondary | ICD-10-CM
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
## 2022-07-28 ENCOUNTER — Other Ambulatory Visit: Payer: Self-pay

## 2022-08-02 ENCOUNTER — Encounter (INDEPENDENT_AMBULATORY_CARE_PROVIDER_SITE_OTHER): Payer: Self-pay

## 2022-08-05 ENCOUNTER — Encounter: Payer: Self-pay | Admitting: Internal Medicine

## 2022-08-08 ENCOUNTER — Other Ambulatory Visit: Payer: Self-pay

## 2022-08-09 ENCOUNTER — Other Ambulatory Visit: Payer: Self-pay | Admitting: Internal Medicine

## 2022-08-13 ENCOUNTER — Inpatient Hospital Stay: Payer: Medicare HMO

## 2022-08-13 ENCOUNTER — Other Ambulatory Visit: Payer: Medicare HMO

## 2022-08-13 ENCOUNTER — Inpatient Hospital Stay: Payer: Medicare HMO | Attending: Internal Medicine | Admitting: Oncology

## 2022-08-13 VITALS — BP 109/59 | HR 84 | Temp 97.7°F | Wt 180.7 lb

## 2022-08-13 DIAGNOSIS — D491 Neoplasm of unspecified behavior of respiratory system: Secondary | ICD-10-CM

## 2022-08-13 DIAGNOSIS — E876 Hypokalemia: Secondary | ICD-10-CM | POA: Diagnosis not present

## 2022-08-13 DIAGNOSIS — M549 Dorsalgia, unspecified: Secondary | ICD-10-CM | POA: Diagnosis not present

## 2022-08-13 DIAGNOSIS — E039 Hypothyroidism, unspecified: Secondary | ICD-10-CM | POA: Diagnosis not present

## 2022-08-13 DIAGNOSIS — C3411 Malignant neoplasm of upper lobe, right bronchus or lung: Secondary | ICD-10-CM | POA: Insufficient documentation

## 2022-08-13 DIAGNOSIS — Z79890 Hormone replacement therapy (postmenopausal): Secondary | ICD-10-CM | POA: Diagnosis not present

## 2022-08-13 DIAGNOSIS — Z5112 Encounter for antineoplastic immunotherapy: Secondary | ICD-10-CM | POA: Insufficient documentation

## 2022-08-13 LAB — CBC WITH DIFFERENTIAL/PLATELET
Abs Immature Granulocytes: 0.02 10*3/uL (ref 0.00–0.07)
Basophils Absolute: 0.1 10*3/uL (ref 0.0–0.1)
Basophils Relative: 1 %
Eosinophils Absolute: 0.3 10*3/uL (ref 0.0–0.5)
Eosinophils Relative: 4 %
HCT: 37.2 % (ref 36.0–46.0)
Hemoglobin: 12.5 g/dL (ref 12.0–15.0)
Immature Granulocytes: 0 %
Lymphocytes Relative: 33 %
Lymphs Abs: 2.3 10*3/uL (ref 0.7–4.0)
MCH: 30.7 pg (ref 26.0–34.0)
MCHC: 33.6 g/dL (ref 30.0–36.0)
MCV: 91.4 fL (ref 80.0–100.0)
Monocytes Absolute: 0.8 10*3/uL (ref 0.1–1.0)
Monocytes Relative: 11 %
Neutro Abs: 3.5 10*3/uL (ref 1.7–7.7)
Neutrophils Relative %: 51 %
Platelets: 160 10*3/uL (ref 150–400)
RBC: 4.07 MIL/uL (ref 3.87–5.11)
RDW: 11.8 % (ref 11.5–15.5)
WBC: 7 10*3/uL (ref 4.0–10.5)
nRBC: 0 % (ref 0.0–0.2)

## 2022-08-13 LAB — COMPREHENSIVE METABOLIC PANEL
ALT: 20 U/L (ref 0–44)
AST: 32 U/L (ref 15–41)
Albumin: 3.7 g/dL (ref 3.5–5.0)
Alkaline Phosphatase: 47 U/L (ref 38–126)
Anion gap: 10 (ref 5–15)
BUN: 11 mg/dL (ref 8–23)
CO2: 26 mmol/L (ref 22–32)
Calcium: 9.2 mg/dL (ref 8.9–10.3)
Chloride: 101 mmol/L (ref 98–111)
Creatinine, Ser: 0.66 mg/dL (ref 0.44–1.00)
GFR, Estimated: >60 mL/min (ref >60)
Glucose, Bld: 180 mg/dL — ABNORMAL HIGH (ref 70–99)
Potassium: 3.3 mmol/L — ABNORMAL LOW (ref 3.5–5.1)
Sodium: 137 mmol/L (ref 135–145)
Total Bilirubin: 0.6 mg/dL (ref 0.3–1.2)
Total Protein: 7.2 g/dL (ref 6.5–8.1)

## 2022-08-13 LAB — TSH: TSH: <0.01 u[IU]/mL — ABNORMAL LOW (ref 0.350–4.500)

## 2022-08-13 MED ORDER — SODIUM CHLORIDE 0.9% FLUSH
10.0000 mL | INTRAVENOUS | Status: DC | PRN
  Filled 2022-08-13: qty 10

## 2022-08-13 MED ORDER — SODIUM CHLORIDE 0.9 % IV SOLN
Freq: Once | INTRAVENOUS | Status: AC
  Filled 2022-08-13: qty 250

## 2022-08-13 MED ORDER — SODIUM CHLORIDE 0.9 % IV SOLN
1200.0000 mg | Freq: Once | INTRAVENOUS | Status: AC
  Administered 2022-08-13: 1200 mg via INTRAVENOUS
  Filled 2022-08-13: qty 20

## 2022-08-13 MED ORDER — HEPARIN SOD (PORK) LOCK FLUSH 100 UNIT/ML IV SOLN
500.0000 [IU] | Freq: Once | INTRAVENOUS | Status: AC | PRN
  Administered 2022-08-13: 500 [IU]
  Filled 2022-08-13: qty 5

## 2022-08-13 NOTE — Progress Notes (Unsigned)
Fatigue and thyroid issues from her immunotherapy.

## 2022-08-13 NOTE — Patient Instructions (Signed)
[facility] AT [place]  Discharge Instructions: Thank you for choosing [facility] to provide your oncology and hematology care.  If you have a lab appointment with the [facility], please go directly to the [facility] and check in at the registration area.  Wear comfortable clothing and clothing appropriate for easy access to any Portacath or PICC line.   We strive to give you quality time with your provider. You may need to reschedule your appointment if you arrive late (15 or more minutes).  Arriving late affects you and other patients whose appointments are after yours.  Also, if you miss three or more appointments without notifying the office, you may be dismissed from the clinic at the provider's discretion.      For prescription refill requests, have your pharmacy contact our office and allow 72 hours for refills to be completed.    Today you received the following chemotherapy and/or immunotherapy agents- tecentriq      To help prevent nausea and vomiting after your treatment, we encourage you to take your nausea medication as directed.  BELOW ARE SYMPTOMS THAT SHOULD BE REPORTED IMMEDIATELY: *FEVER GREATER THAN 100.4 F (38 C) OR HIGHER *CHILLS OR SWEATING *NAUSEA AND VOMITING THAT IS NOT CONTROLLED WITH YOUR NAUSEA MEDICATION *UNUSUAL SHORTNESS OF BREATH *UNUSUAL BRUISING OR BLEEDING *URINARY PROBLEMS (pain or burning when urinating, or frequent urination) *BOWEL PROBLEMS (unusual diarrhea, constipation, pain near the anus) TENDERNESS IN MOUTH AND THROAT WITH OR WITHOUT PRESENCE OF ULCERS (sore throat, sores in mouth, or a toothache) UNUSUAL RASH, SWELLING OR PAIN  UNUSUAL VAGINAL DISCHARGE OR ITCHING   Items with * indicate a potential emergency and should be followed up as soon as possible or go to the Emergency Department if any problems should occur.  Please show the CHEMOTHERAPY ALERT CARD or IMMUNOTHERAPY ALERT CARD at check-in to  the Emergency Department and triage nurse.  Should you have questions after your visit or need to cancel or reschedule your appointment, please contact [facility] [place] AT [place]  [phone] and follow the prompts.  Office hours are 8:00 a.m. to 4:30 p.m. Monday - Friday. Please note that voicemails left after 4:00 p.m. may not be returned until the following business day.  We are closed weekends and major holidays. You have access to a nurse at all times for urgent questions. Please call the main number to the clinic [phone] and follow the prompts.  For any non-urgent questions, you may also contact your provider using MyChart. We now offer e-Visits for anyone 35 and older to request care online for non-urgent symptoms. For details visit [email or web address].   Also download the MyChart app! Go to the app store, search "MyChart", open the app, select [place], and log in with your MyChart username and password.  Masks are optional in the cancer centers. If you would like for your care team to wear a mask while they are taking care of you, please let them know. For doctor visits, patients may have with them one support person who is at least 69 years old. At this time, visitors are not allowed in the infusion area.

## 2022-08-14 ENCOUNTER — Encounter: Payer: Self-pay | Admitting: Internal Medicine

## 2022-08-14 LAB — T4: T4, Total: 12.6 ug/dL — ABNORMAL HIGH (ref 4.5–12.0)

## 2022-08-14 NOTE — Progress Notes (Signed)
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  INTERVAL HISTORY: Patient returns to clinic today for further evaluation and continuation of adjuvant Tecentriq.  She continues to have increased weakness and fatigue, but otherwise is tolerating her treatments well.  She has no neurologic complaints.  She denies any recent fevers or illnesses.  She has a fair appetite, but denies weight loss.  She has no chest pain or hemoptysis, but has chronic cough and shortness of breath.  She has no nausea, vomiting, constipation, or diarrhea.  She has no urinary complaints.  Patient offers no further specific complaints today.  REVIEW OF SYSTEMS:   Review of Systems  Constitutional:  Positive for malaise/fatigue. Negative for fever and weight loss.  Respiratory:  Positive for cough and shortness of breath. Negative for hemoptysis.   Cardiovascular: Negative.  Negative for chest pain and leg swelling.  Gastrointestinal: Negative.  Negative for abdominal pain.  Genitourinary: Negative.  Negative for dysuria.  Musculoskeletal: Negative.  Negative for back pain.  Skin: Negative.   Neurological:  Positive for weakness. Negative for dizziness, focal weakness and headaches.  Psychiatric/Behavioral: Negative.  The patient is not nervous/anxious.     As per HPI. Otherwise, a complete review of systems is negative.  PAST MEDICAL HISTORY: Past Medical History:  Diagnosis Date   Angioedema 12/08/2019   Anxiety    Aortic atherosclerosis (HCC)    Asthma    CAD (coronary artery disease)    3 vessel   Cancer (HCC)    COPD (chronic obstructive pulmonary  disease) ([place]) 12/08/2019   DDD (degenerative disc disease), lumbar    Depression    Diabetes mellitus without complication (HCC)    no meds, diet controlled   Dyspnea    GERD (gastroesophageal reflux disease)    HTN (hypertension) 12/08/2019   Hyperlipidemia    Hypertension    Mass of upper lobe of right lung 04/02/2021   spiculated mass RIGHT posterior pulmonary apex with associated hilar and pretracheal LAD; measured 3.2 x 2.8 cm   Obesity (BMI 30-39.9)    T2DM (type 2 diabetes mellitus) ([place]) 12/08/2019    PAST SURGICAL HISTORY: Past Surgical History:  Procedure Laterality Date   BACK SURGERY     lumbar L5-S1 ruptured disc x 3 surgeries   CERVICAL FUSION  2009   FINE NEEDLE ASPIRATION  06/23/2021   Procedure: FINE NEEDLE ASPIRATION (FNA) LINEAR;  Surgeon: Garner Nash, DO;  Location: [facility];  Service: Pulmonary;;   INTERCOSTAL NERVE BLOCK Right 07/09/2021   Procedure: INTERCOSTAL NERVE BLOCK;  Surgeon: Lajuana Matte, MD;  Location: [facility];  Service: Thoracic;  Laterality: Right;   IR IMAGING GUIDED PORT INSERTION  08/06/2021   LAPAROSCOPIC TOTAL HYSTERECTOMY  1998   with oophorectomy   LYMPH NODE DISSECTION Right 07/09/2021   Procedure: LYMPH NODE DISSECTION;  Surgeon: Lajuana Matte, MD;  Location: [facility];  Service: Thoracic;  Laterality: Right;   TONSILLECTOMY     VIDEO BRONCHOSCOPY WITH ENDOBRONCHIAL NAVIGATION N/A 04/15/2021   Procedure: VIDEO BRONCHOSCOPY WITH ENDOBRONCHIAL NAVIGATION;  Surgeon: Ottie Glazier, MD;  Location: [facility];  Service: Thoracic;  Laterality: N/A;   VIDEO BRONCHOSCOPY WITH ENDOBRONCHIAL ULTRASOUND  N/A 04/15/2021   Procedure: VIDEO BRONCHOSCOPY WITH ENDOBRONCHIAL ULTRASOUND;  Surgeon: Ottie Glazier, MD;  Location: [facility];  Service: Thoracic;  Laterality: N/A;   VIDEO BRONCHOSCOPY WITH ENDOBRONCHIAL ULTRASOUND N/A 06/23/2021   Procedure: VIDEO BRONCHOSCOPY WITH ENDOBRONCHIAL ULTRASOUND;  Surgeon: Garner Nash, DO;  Location:  [place];  Service: Pulmonary;  Laterality: N/A;    FAMILY HISTORY: Family History  Problem Relation Age of Onset   Bone cancer Maternal Uncle     ADVANCED DIRECTIVES (Y/N):  N  HEALTH MAINTENANCE: Social History   Tobacco Use   Smoking status: Former    Packs/day: 3.00    Years: 50.00    Total pack years: 150.00    Types: Cigarettes    Quit date: 2019    Years since quitting: 4.8   Smokeless tobacco: Never  Vaping Use   Vaping Use: Never used  Substance Use Topics   Alcohol use: Not Currently   Drug use: Never     Colonoscopy:  PAP:  Bone density:  Lipid panel:  Allergies  Allergen Reactions   Ibuprofen Hives   Macrobid [Nitrofurantoin] Rash    Current Outpatient Medications  Medication Sig Dispense Refill   albuterol (VENTOLIN HFA) 108 (90 Base) MCG/ACT inhaler Inhale 1 puff into the lungs every 6 (six) hours as needed for wheezing.     aspirin EC 81 MG tablet Take 81 mg by mouth at bedtime.     Biotin 1000 MCG tablet Take 1,000 mcg by mouth 3 (three) times daily.     clonazePAM (KLONOPIN) 0.5 MG tablet Take 0.5 mg by mouth in the morning, at noon, and at bedtime.     diphenoxylate-atropine (LOMOTIL) 2.5-0.025 MG tablet Take by mouth.     docusate sodium (COLACE) 100 MG capsule Take 200 mg by mouth at bedtime.     EPINEPHrine 0.3 mg/0.3 mL IJ SOAJ injection Inject 0.3 mLs (0.3 mg total) into the muscle as needed for anaphylaxis. 2 each 0   ergocalciferol (VITAMIN D2) 1.25 MG (50000 UT) capsule Take 1 capsule (50,000 Units total) by mouth once a week. 12 capsule 1   fluticasone-salmeterol (ADVAIR) 250-50 MCG/ACT AEPB Inhale 1 puff into the lungs in the morning and at bedtime.     gabapentin (NEURONTIN) 300 MG capsule Take 300 mg by mouth 3 (three) times daily. Take 300 mg qam and lunch and take 600 mg qhs     hydrochlorothiazide (HYDRODIURIL) 25 MG tablet Take 25 mg by mouth every other day.     HYDROcodone-acetaminophen (NORCO) 10-325 MG tablet Take 1  tablet by mouth every 6 (six) hours as needed.     ipratropium-albuterol (DUONEB) 0.5-2.5 (3) MG/3ML SOLN Take 3 mLs by nebulization 3 (three) times daily.     levothyroxine (SYNTHROID) 125 MCG tablet TAKE 1 TABLET(125 MCG) BY MOUTH DAILY BEFORE BREAKFAST 90 tablet 1   lidocaine-prilocaine (EMLA) cream Apply topically once.     Melatonin 10 MG TABS Take 10 mg by mouth at bedtime.     metFORMIN (GLUCOPHAGE) 500 MG tablet TAKE 1 TABLET(500 MG) BY MOUTH TWICE DAILY WITH A MEAL 60 tablet 3   Multiple Vitamin (MULTI-VITAMIN) tablet Take 1 tablet by mouth at bedtime.     nystatin cream (MYCOSTATIN) Apply 1 application topically 2 (two) times daily as needed for dry skin (around lips).     pantoprazole (PROTONIX) 40 MG tablet Take 40 mg by mouth at bedtime.     potassium chloride SA (KLOR-CON) 20 MEQ tablet Take 20 mEq by  mouth 2 (two) times daily.     pravastatin (PRAVACHOL) 80 MG tablet Take 80 mg by mouth at bedtime.     prochlorperazine (COMPAZINE) 10 MG tablet Take 1 tablet (10 mg total) by mouth every 6 (six) hours as needed (Nausea or vomiting). 30 tablet 1   tiZANidine (ZANAFLEX) 4 MG tablet Take 4 mg by mouth 3 (three) times daily.     traZODone (DESYREL) 100 MG tablet Take 50 mg by mouth at bedtime.     venlafaxine XR (EFFEXOR-XR) 75 MG 24 hr capsule Take 150 mg by mouth at bedtime.     No current facility-administered medications for this visit.   Facility-Administered Medications Ordered in Other Visits  Medication Dose Route Frequency Provider Last Rate Last Admin   heparin lock flush 100 UNIT/ML injection             OBJECTIVE: Vitals:   08/13/22 0953  BP: (!) 109/59  Pulse: 84  Temp: 97.7 F (36.5 C)  SpO2: 97%     Body mass index is 36.5 kg/m.    ECOG FS:0 - Asymptomatic  General: Well-developed, well-nourished, no acute distress. Eyes: Pink conjunctiva, anicteric sclera. HEENT: Normocephalic, moist mucous membranes. Lungs: No audible wheezing or coughing. Heart:  Regular rate and rhythm. Abdomen: Soft, nontender, no obvious distention. Musculoskeletal: No edema, cyanosis, or clubbing. Neuro: Alert, answering all questions appropriately. Cranial nerves grossly intact. Skin: No rashes or petechiae noted. Psych: Normal affect.  LAB RESULTS:  Lab Results  Component Value Date   NA 137 08/13/2022   K 3.3 (L) 08/13/2022   CL 101 08/13/2022   CO2 26 08/13/2022   GLUCOSE 180 (H) 08/13/2022   BUN 11 08/13/2022   CREATININE 0.66 08/13/2022   CALCIUM 9.2 08/13/2022   PROT 7.2 08/13/2022   ALBUMIN 3.7 08/13/2022   AST 32 08/13/2022   ALT 20 08/13/2022   ALKPHOS 47 08/13/2022   BILITOT 0.6 08/13/2022   GFRNONAA >60 08/13/2022   GFRAA  09/18/2008    >60        The eGFR has been calculated using the MDRD equation. This calculation has not been validated in all clinical    Lab Results  Component Value Date   WBC 7.0 08/13/2022   NEUTROABS 3.5 08/13/2022   HGB 12.5 08/13/2022   HCT 37.2 08/13/2022   MCV 91.4 08/13/2022   PLT 160 08/13/2022     STUDIES: CT HEAD W & WO CONTRAST (5MM)  Result Date: 07/15/2022 CLINICAL DATA:  Non-small cell lung cancer staging. Ongoing headaches and fatigue EXAM: CT HEAD WITHOUT AND WITH CONTRAST TECHNIQUE: Contiguous axial images were obtained from the base of the skull through the vertex without and with intravenous contrast. RADIATION DOSE REDUCTION: This exam was performed according to the departmental dose-optimization program which includes automated exposure control, adjustment of the mA and/or kV according to patient size and/or use of iterative reconstruction technique. CONTRAST:  21m OMNIPAQUE IOHEXOL 300 MG/ML  SOLN COMPARISON:  Head CT from 04/28/2021 FINDINGS: Brain: No evidence of acute infarction, hemorrhage, hydrocephalus, extra-axial collection or mass lesion/mass effect. 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 Vascular: Major vessels are enhancing Skull: Normal. Negative for fracture or  focal lesion. Sinuses/Orbits: Negative.  No explanation for headache. IMPRESSION: Stable head CT. No evidence of metastatic disease or explanation for headache. Electronically Signed   By: JJorje GuildM.D.   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  Patient's most recent imaging on December 16, 2021 did not reveal any evidence of recurrent or progressive disease.  Proceed with cycle 9 of adjuvant Atezoluzimab today.  Return to clinic in 3 weeks for further evaluation and consideration of cycle 10. Hypokalemia: Mild, monitor.  Patient's potassium is 3.3 today.  Continue oral supplementation. Myalgia/peripheral neuropathy: Chronic and unchanged.  Continue current medications as prescribed. Back pain: No evidence of metastatic disease.  Continue hydrocodone as prescribed. Hypothyroidism: Continue current dose of Synthroid.  Patient expressed understanding and was in agreement with this plan. She also understands that She can call clinic at any time with any questions, concerns, or complaints.    Cancer Staging  Malignant neoplasm of upper lobe of right lung (HCC) Staging form: Lung, AJCC 8th Edition - Clinical: Stage IB (cT2a, cN0, cM0) - Signed by Cammie Sickle, MD on 05/02/2021 Stage prefix: Initial diagnosis - Pathologic: Stage IIB (pT3, pN0, cM0) - Signed by Cammie Sickle, MD on 07/23/2021 Stage prefix: Initial diagnosis   Lloyd Huger, MD   08/14/2022 6:33 AM

## 2022-08-15 ENCOUNTER — Other Ambulatory Visit: Payer: Self-pay

## 2022-08-31 DIAGNOSIS — C3411 Malignant neoplasm of upper lobe, right bronchus or lung: Secondary | ICD-10-CM | POA: Diagnosis not present

## 2022-08-31 DIAGNOSIS — E118 Type 2 diabetes mellitus with unspecified complications: Secondary | ICD-10-CM | POA: Diagnosis not present

## 2022-08-31 DIAGNOSIS — E782 Mixed hyperlipidemia: Secondary | ICD-10-CM | POA: Diagnosis not present

## 2022-08-31 DIAGNOSIS — J449 Chronic obstructive pulmonary disease, unspecified: Secondary | ICD-10-CM | POA: Diagnosis not present

## 2022-08-31 DIAGNOSIS — I10 Essential (primary) hypertension: Secondary | ICD-10-CM | POA: Diagnosis not present

## 2022-08-31 DIAGNOSIS — Z87891 Personal history of nicotine dependence: Secondary | ICD-10-CM | POA: Diagnosis not present

## 2022-08-31 DIAGNOSIS — Z79899 Other long term (current) drug therapy: Secondary | ICD-10-CM | POA: Diagnosis not present

## 2022-08-31 DIAGNOSIS — J431 Panlobular emphysema: Secondary | ICD-10-CM | POA: Diagnosis not present

## 2022-09-01 ENCOUNTER — Other Ambulatory Visit: Payer: Self-pay

## 2022-09-02 ENCOUNTER — Other Ambulatory Visit: Payer: Self-pay

## 2022-09-03 ENCOUNTER — Inpatient Hospital Stay: Payer: Medicare HMO | Attending: Internal Medicine | Admitting: Internal Medicine

## 2022-09-03 ENCOUNTER — Inpatient Hospital Stay: Payer: Medicare HMO

## 2022-09-03 ENCOUNTER — Encounter: Payer: Self-pay | Admitting: Internal Medicine

## 2022-09-03 VITALS — BP 87/65 | HR 76 | Temp 97.6°F | Resp 19 | Wt 178.4 lb

## 2022-09-03 DIAGNOSIS — F32A Depression, unspecified: Secondary | ICD-10-CM | POA: Diagnosis not present

## 2022-09-03 DIAGNOSIS — C3411 Malignant neoplasm of upper lobe, right bronchus or lung: Secondary | ICD-10-CM

## 2022-09-03 DIAGNOSIS — G620 Drug-induced polyneuropathy: Secondary | ICD-10-CM | POA: Diagnosis not present

## 2022-09-03 DIAGNOSIS — D491 Neoplasm of unspecified behavior of respiratory system: Secondary | ICD-10-CM | POA: Diagnosis not present

## 2022-09-03 DIAGNOSIS — E876 Hypokalemia: Secondary | ICD-10-CM | POA: Diagnosis not present

## 2022-09-03 DIAGNOSIS — Z452 Encounter for adjustment and management of vascular access device: Secondary | ICD-10-CM | POA: Diagnosis not present

## 2022-09-03 DIAGNOSIS — Z5112 Encounter for antineoplastic immunotherapy: Secondary | ICD-10-CM | POA: Diagnosis present

## 2022-09-03 DIAGNOSIS — G8929 Other chronic pain: Secondary | ICD-10-CM | POA: Insufficient documentation

## 2022-09-03 DIAGNOSIS — J209 Acute bronchitis, unspecified: Secondary | ICD-10-CM | POA: Diagnosis not present

## 2022-09-03 DIAGNOSIS — R519 Headache, unspecified: Secondary | ICD-10-CM | POA: Diagnosis not present

## 2022-09-03 DIAGNOSIS — E0590 Thyrotoxicosis, unspecified without thyrotoxic crisis or storm: Secondary | ICD-10-CM | POA: Insufficient documentation

## 2022-09-03 DIAGNOSIS — Z79899 Other long term (current) drug therapy: Secondary | ICD-10-CM | POA: Diagnosis not present

## 2022-09-03 DIAGNOSIS — Z95828 Presence of other vascular implants and grafts: Secondary | ICD-10-CM

## 2022-09-03 DIAGNOSIS — M549 Dorsalgia, unspecified: Secondary | ICD-10-CM | POA: Diagnosis not present

## 2022-09-03 LAB — COMPREHENSIVE METABOLIC PANEL
ALT: 23 U/L (ref 0–44)
AST: 28 U/L (ref 15–41)
Albumin: 3.1 g/dL — ABNORMAL LOW (ref 3.5–5.0)
Alkaline Phosphatase: 60 U/L (ref 38–126)
Anion gap: 11 (ref 5–15)
BUN: 10 mg/dL (ref 8–23)
CO2: 26 mmol/L (ref 22–32)
Calcium: 8.6 mg/dL — ABNORMAL LOW (ref 8.9–10.3)
Chloride: 99 mmol/L (ref 98–111)
Creatinine, Ser: 0.63 mg/dL (ref 0.44–1.00)
GFR, Estimated: >60 mL/min (ref >60)
Glucose, Bld: 248 mg/dL — ABNORMAL HIGH (ref 70–99)
Potassium: 3.3 mmol/L — ABNORMAL LOW (ref 3.5–5.1)
Sodium: 136 mmol/L (ref 135–145)
Total Bilirubin: 0.4 mg/dL (ref 0.3–1.2)
Total Protein: 7.3 g/dL (ref 6.5–8.1)

## 2022-09-03 LAB — CBC WITH DIFFERENTIAL/PLATELET
Abs Immature Granulocytes: 0.06 10*3/uL (ref 0.00–0.07)
Basophils Absolute: 0.1 10*3/uL (ref 0.0–0.1)
Basophils Relative: 1 %
Eosinophils Absolute: 0.3 10*3/uL (ref 0.0–0.5)
Eosinophils Relative: 4 %
HCT: 36 % (ref 36.0–46.0)
Hemoglobin: 12.2 g/dL (ref 12.0–15.0)
Immature Granulocytes: 1 %
Lymphocytes Relative: 27 %
Lymphs Abs: 2.5 10*3/uL (ref 0.7–4.0)
MCH: 30.4 pg (ref 26.0–34.0)
MCHC: 33.9 g/dL (ref 30.0–36.0)
MCV: 89.8 fL (ref 80.0–100.0)
Monocytes Absolute: 0.6 10*3/uL (ref 0.1–1.0)
Monocytes Relative: 6 %
Neutro Abs: 5.8 10*3/uL (ref 1.7–7.7)
Neutrophils Relative %: 61 %
Platelets: 197 10*3/uL (ref 150–400)
RBC: 4.01 MIL/uL (ref 3.87–5.11)
RDW: 11.6 % (ref 11.5–15.5)
WBC: 9.3 10*3/uL (ref 4.0–10.5)
nRBC: 0 % (ref 0.0–0.2)

## 2022-09-03 LAB — TSH: TSH: 0.015 u[IU]/mL — ABNORMAL LOW (ref 0.350–4.500)

## 2022-09-03 MED ORDER — HEPARIN SOD (PORK) LOCK FLUSH 100 UNIT/ML IV SOLN
500.0000 [IU] | Freq: Once | INTRAVENOUS | Status: AC
  Administered 2022-09-03: 500 [IU] via INTRAVENOUS
  Filled 2022-09-03: qty 5

## 2022-09-03 MED ORDER — SODIUM CHLORIDE 0.9% FLUSH
10.0000 mL | Freq: Once | INTRAVENOUS | Status: AC
  Filled 2022-09-03: qty 10

## 2022-09-03 NOTE — Progress Notes (Signed)
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 Spiculated mass of the posterior right pulmonary apex measuring 3.2 x 2.8 cm, consistent with primary lung malignancy. 2. Enlarged right hilar and pretracheal lymph nodes measuring up to 1.6 x 1.5 cm, suspicious for nodal metastatic disease. 3. Recommend multidisciplinary thoracic referral for consideration of PET-CT metabolic characterization and tissue sampling. 4. Background of very fine centrilobular nodularity throughout the lungs, most concentrated at the apices. Scattered bronchiolar plugging. Findings are most consistent with smoking-related respiratory bronchiolitis. 5. Coarse, nodular contour of the liver in the included upper abdomen, suggestive of cirrhosis. 6. Coronary artery disease.  # COPD-  # DM- diet controlled; chronic back pain- hydrocodone 5 day; chronic left lower extremity weakness.  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 Currently on Synthroid 150 mcg/day-  # NGS/MOLECULAR TESTS:    # PALLIATIVE CARE EVALUATION:  # PAIN MANAGEMENT:    DIAGNOSIS:   STAGE:         ;  GOALS:  CURRENT/MOST RECENT THERAPY :    A. LYMPH NODE, HILAR, EXCISION:  - Lymph node negative for metastatic carcinoma.   B. LYMPH NODE, LEVEL 4, EXCISION:  - Lymph node negative for metastatic carcinoma.   C. LUNG, RIGHT UPPER LOBE, LOBECTOMY:  - Squamous cell carcinoma, 4.5 cm.  - Carcinoma extends through visceral pleura and focally involves  parietal pleura.  -  Surgical margins negative for carcinoma.  - One lymph node negative for carcinoma.  - See oncology table and comment.   D. SOFT TISSUE, CHEST WALL, BIOPSY:  - Fibroadipose tissue with focal inflammation and fibrosis.  - No carcinoma identified.   ONCOLOGY TABLE:  LUNG: Resection  Synchronous Tumors: Not applicable.  Total Number of Primary Tumors: 1  Procedure: Right upper lobectomy with lymph node biopsies and chest wall  biopsy.  Specimen Laterality: Right upper lobe.  Tumor Focality: Unifocal.  Tumor Site: Right upper lobe.  Tumor Size: 4.5 x 4 x 3 cm.  Histologic Type: Squamous cell carcinoma.  Visceral Pleura Invasion: Present.  Direct Invasion of Adjacent Structures: Focal involvement of parietal  pleura.  Lymphovascular Invasion: Not identified.  Margins: All surgical margins negative for carcinoma.  Regional Lymph Nodes:       Number of Lymph Nodes Involved: 0                            Nodal Sites with Tumor: 0       Number of Lymph Nodes Examined: 3                       Nodal Sites Examined: 2  Pathologic Stage Classification (pTNM, AJCC 8th Edition): pT3, pN0  Ancillary Studies: Can be performed if requested.   #Right upper lobe stage IIb-pT3 [not cisplatin candidate]; CarboTaxol x4. April 11th, 2023-Atezo q 3 w x1 year  # AUG 2023- IATROGENIC HYPOTHYRODISM- TSH 100; START SYNTHROID   Malignant neoplasm of upper lobe of right lung ([place])  04/24/2021 Initial Diagnosis   Malignant neoplasm of  upper lobe of right lung ([place])   05/02/2021 Cancer Staging   Staging form: Lung, AJCC 8th Edition - Clinical: Stage IB (cT2a, cN0, cM0) - Signed by Cammie Sickle, MD on 05/02/2021 Stage prefix: Initial diagnosis   07/23/2021 Cancer Staging   Staging form: Lung, AJCC 8th Edition - Pathologic: Stage IIB (pT3, pN0, cM0) - Signed by Cammie Sickle, MD on 07/23/2021 Stage prefix: Initial diagnosis    HISTORY OF PRESENTING ILLNESS:  Patient accompanied with her  daughter today.  in a wheel chair.   Olivia Buchanan 71 y.o.  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   Patient states she has had a cold for the last two weeks. She is currently on 2nd round of antibiotics (Levaquin 534m.   Complains of ongoing cough.  Shortness of with improvement not resolved.  Complains of ongoing fatigue.    Mild nausea no vomiting.  No worsening tingling or numbness.  Chronic back pain.   No chest pain.  Headaches improved.    Review of Systems  Constitutional:  Positive for malaise/fatigue and weight loss. Negative for chills, diaphoresis and fever.  HENT:  Negative for nosebleeds and sore throat.   Eyes:  Negative for double vision.  Respiratory:  Positive for cough and shortness of breath. Negative for hemoptysis and wheezing.   Cardiovascular:  Negative for chest pain, palpitations, orthopnea and leg swelling.  Gastrointestinal:  Negative for abdominal pain, blood in stool, constipation, diarrhea, heartburn, melena and vomiting.  Genitourinary:  Negative for dysuria, frequency and urgency.  Musculoskeletal:  Positive for back pain, joint pain and neck pain.  Skin: Negative.  Negative for itching and rash.  Neurological:  Negative for tingling, focal weakness and weakness.  Endo/Heme/Allergies:  Does not bruise/bleed easily.  Psychiatric/Behavioral:  Negative for depression. The patient is not nervous/anxious and does not have insomnia.      MEDICAL HISTORY:  Past Medical History:  Diagnosis Date   Angioedema 12/08/2019   Anxiety    Aortic atherosclerosis (HCC)    Asthma    CAD (coronary artery disease)    3 vessel   Cancer (HCC)    COPD [place]ch[place] obstructive pulmonary disease) ([facility] 12/08/2019   DDD (degenerative disc disease), lumbar    Depression    Diabetes mellitus without complication (HCC)    no meds, diet controlled   Dyspnea    GERD (gastroesophageal reflux disease)    HTN (hypertension)  12/08/2019   Hyperlipidemia    Hypertension    Mass of upper lobe of right lung 04/02/2021   spiculated mass RIGHT posterior pulmonary apex with associated hilar and pretracheal LAD; measured 3.2 x 2.8 cm   Obesity (BMI 30-39.9)    T2DM (type 2 diabetes mellitus) ([facility] 12/08/2019    SURGICAL HISTORY: Past Surgical History:  Procedure Laterality Date   BACK SURGERY     lumbar L5-S1 ruptured disc x 3 surgeries   CERVICAL FUSION  2009   FINE NEEDLE ASPIRATION  06/23/2021   Procedure: FINE NEEDLE ASPIRATION (FNA) LINEAR;  Surgeon: IGarner Nash DO;  [facility];  Service: Pulmonary;;   INTERCOSTAL NERVE BLOCK Right 07/09/2021   Procedure: INTERCOSTAL NERVE BLOCK;  Surgeon: LLajuana Matte MD;  [facility];  Service: Thoracic;  Laterality: Right;   IR IMAGING GUIDED PORT INSERTION  08/06/2021   LAPAROSCOPIC TOTAL HYSTERECTOMY  1998   with oophorectomy   LYMPH NODE DISSECTION Right 07/09/2021   Procedure: LYMPH NODE DISSECTION;  Surgeon: Lajuana Matte, MD;  Location: [facility];  Service: Thoracic;  Laterality: Right;   TONSILLECTOMY     VIDEO BRONCHOSCOPY WITH ENDOBRONCHIAL NAVIGATION N/A 04/15/2021   Procedure: VIDEO BRONCHOSCOPY WITH ENDOBRONCHIAL NAVIGATION;  Surgeon: Ottie Glazier, MD;  Location: [facility];  Service: Thoracic;  Laterality: N/A;   VIDEO BRONCHOSCOPY WITH ENDOBRONCHIAL ULTRASOUND N/A 04/15/2021   Procedure: VIDEO BRONCHOSCOPY WITH ENDOBRONCHIAL ULTRASOUND;  Surgeon: Ottie Glazier, MD;  Location: [facility];  Service: Thoracic;  Laterality: N/A;   VIDEO BRONCHOSCOPY WITH ENDOBRONCHIAL ULTRASOUND N/A 06/23/2021   Procedure: VIDEO BRONCHOSCOPY WITH ENDOBRONCHIAL ULTRASOUND;  Surgeon: Garner Nash, DO;  Location: [facility];  Service: Pulmonary;  Laterality: N/A;    SOCIAL HISTORY: Social History   Socioeconomic History   Marital status: Married    Spouse name: Dwight   Number of children: 3   Years of education: Not on file   Highest  education level: Not on file  Occupational History   Not on file  Tobacco Use   Smoking status: Former    Packs/day: 3.00    Years: 50.00    Total pack years: 150.00    Types: Cigarettes    Quit date: 2019    Years since quitting: 4.9   Smokeless tobacco: Never  Vaping Use   Vaping Use: Never used  Substance and Sexual Activity   Alcohol use: Not Currently   Drug use: Never   Sexual activity: Not on file    Comment: Hysterectomy  Other Topics Concern   Not on file  Social History Narrative   Lives in home; with husband; lives in [place]; never smoking 2019; no alcohol. 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    Social Determinants of Health   Financial Resource Strain: Not on file  Food Insecurity: Not on file  Transportation Needs: Not on file  Physical Activity: Not on file  Stress: Not on file  Social Connections: Not on file  Intimate Partner Violence: Not on file    FAMILY HISTORY: Family History  Problem Relation Age of Onset   Bone cancer Maternal Uncle     ALLERGIES:  is allergic to ibuprofen and macrobid [nitrofurantoin].  MEDICATIONS:  Current Outpatient Medications  Medication Sig Dispense Refill   albuterol (VENTOLIN HFA) 108 (90 Base) MCG/ACT inhaler Inhale 1 puff into the lungs every 6 (six) hours as needed for wheezing.     aspirin EC 81 MG tablet Take 81 mg by mouth at bedtime.     Biotin 1000 MCG tablet Take 1,000 mcg by mouth 3 (three) times daily.     clonazePAM (KLONOPIN) 0.5 MG tablet Take 0.5 mg by mouth in the morning, at noon, and at bedtime.     diphenoxylate-atropine (LOMOTIL) 2.5-0.025 MG tablet Take by mouth.     docusate sodium (COLACE) 100 MG capsule Take 200 mg by mouth at bedtime.     EPINEPHrine 0.3 mg/0.3 mL IJ SOAJ injection Inject 0.3 mLs (0.3 mg total) into the muscle as needed for anaphylaxis. 2 each 0   ergocalciferol (VITAMIN D2) 1.25 MG (50000 UT) capsule Take 1 capsule (50,000 Units total) by mouth  once a week. 12 capsule 1   fluticasone-salmeterol (ADVAIR) 250-50 MCG/ACT AEPB Inhale 1 puff into the lungs in the morning and at bedtime.     gabapentin (NEURONTIN) 300 MG capsule Take 300 mg by mouth 3 (three) times daily. Take 300 mg qam and lunch and take 600 mg qhs     hydrochlorothiazide (HYDRODIURIL) 25  MG tablet Take 25 mg by mouth every other day.     HYDROcodone-acetaminophen (NORCO) 10-325 MG tablet Take 1 tablet by mouth every 6 (six) hours as needed.     ipratropium-albuterol (DUONEB) 0.5-2.5 (3) MG/3ML SOLN Take 3 mLs by nebulization 3 (three) times daily.     levofloxacin (LEVAQUIN) 500 MG tablet Take by mouth.     levothyroxine (SYNTHROID) 125 MCG tablet TAKE 1 TABLET(125 MCG) BY MOUTH DAILY BEFORE BREAKFAST 90 tablet 1   lidocaine-prilocaine (EMLA) cream Apply topically once.     Melatonin 10 MG TABS Take 10 mg by mouth at bedtime.     metFORMIN (GLUCOPHAGE) 500 MG tablet TAKE 1 TABLET(500 MG) BY MOUTH TWICE DAILY WITH A MEAL 60 tablet 3   Multiple Vitamin (MULTI-VITAMIN) tablet Take 1 tablet by mouth at bedtime.     nystatin cream (MYCOSTATIN) Apply 1 application topically 2 (two) times daily as needed for dry skin (around lips).     pantoprazole (PROTONIX) 40 MG tablet Take 40 mg by mouth at bedtime.     potassium chloride SA (KLOR-CON) 20 MEQ tablet Take 20 mEq by mouth 2 (two) times daily.     pravastatin (PRAVACHOL) 80 MG tablet Take 80 mg by mouth at bedtime.     prochlorperazine (COMPAZINE) 10 MG tablet Take 1 tablet (10 mg total) by mouth every 6 (six) hours as needed (Nausea or vomiting). 30 tablet 1   tiZANidine (ZANAFLEX) 4 MG tablet Take 4 mg by mouth 3 (three) times daily.     traZODone (DESYREL) 100 MG tablet Take 50 mg by mouth at bedtime.     venlafaxine XR (EFFEXOR-XR) 75 MG 24 hr capsule Take 150 mg by mouth at bedtime.     No current facility-administered medications for this visit.   Facility-Administered Medications Ordered in Other Visits  Medication  Dose Route Frequency Provider Last Rate Last Admin   heparin lock flush 100 UNIT/ML injection            sodium chloride flush (NS) 0.9 % injection 10 mL  10 mL Intravenous Once Charlaine Dalton R, MD          .  PHYSICAL EXAMINATION: ECOG PERFORMANCE STATUS: 1 - Symptomatic but completely ambulatory  Vitals:   09/03/22 0900  BP: (!) 87/65  Pulse: 76  Resp: 19  Temp: 97.6 F (36.4 C)  SpO2: 94%   Filed Weights   09/03/22 0900  Weight: 178 lb 6.4 oz (80.9 kg)    Physical Exam Vitals and nursing note reviewed.  HENT:     Head: Normocephalic and atraumatic.     Mouth/Throat:     Mouth: Mucous membranes are moist.     Pharynx: Oropharynx is clear. No oropharyngeal exudate.  Eyes:     Extraocular Movements: Extraocular movements intact.     Pupils: Pupils are equal, round, and reactive to light.  Cardiovascular:     Rate and Rhythm: Normal rate and regular rhythm.  Pulmonary:     [place]: No respiratory distress.     Breath sounds: No wheezing.     Comments: Decreased breath sounds bilaterally at bases.  No wheeze or crackles Abdominal:     General: Bowel sounds are normal. There is no distension.     Palpations: Abdomen is soft. There is no mass.     Tenderness: There is no abdominal tenderness. There is no guarding or rebound.  Musculoskeletal:        General: No tenderness. Normal range of motion.  Cervical back: Normal range of motion and neck supple.  Skin:    General: Skin is warm.  Neurological:     General: No focal deficit present.     Mental Status: She is alert and oriented to person, place, and time.     Comments: 4-5 strength in the left lower extremity/chronic.  Psychiatric:        Mood and Affect: Affect normal.        Behavior: Behavior normal.        Judgment: Judgment normal.      LABORATORY DATA:  I have reviewed the data as listed Lab Results  Component Value Date   WBC 9.3 09/03/2022   HGB 12.2 09/03/2022   HCT 36.0 09/03/2022    MCV 89.8 09/03/2022   PLT 197 09/03/2022   Recent Labs    07/23/22 0856 08/13/22 0924 09/03/22 0837  NA 140 137 136  K 3.1* 3.3* 3.3*  CL 103 101 99  CO2 _0 GLUCOSE 165* 180* 248*  BUN _1 CREATININE 0.70 0.66 0.63  CALCIUM 9.0 9.2 8.6*  GFRNONAA >60 >60 >60  PROT 7.5 7.2 7.3  ALBUMIN 3.7 3.7 3.1*  AST 34 32 28  ALT _2 ALKPHOS 51 47 60  BILITOT 0.5 0.6 0.4    RADIOGRAPHIC STUDIES: I have personally reviewed the radiological images as listed and agreed with the findings in the report. No results found.  ASSESSMENT & PLAN:   Malignant neoplasm of upper lobe of right lung ([place]) # pT3N0 [4.5cm; Squamous cell ca- + involvement of the parietal pleura]; stage II. Currently s/p adjuvant carbo-taxol x4 [poor candidate for cisplatin].  PD-L1-5%; MARCH 15th, 2023-no evidence of any recurrent disease; stable mediastinal lymph node/previously PET negative; fibrotic/bronchitic changes noted bilateral lower lobes [ex-smoker]; currently on adjuvant Atezolizumab. STABLE.  Multiple interruptions given patient's overall general co-morbidities-unrelated to treatment. STABLE.   # HOLD Adjuvant Atezoluzimab # 10 of planned 13 [acute bronchitis- see below].   Labs today reviewed;  acceptable for treatment today. Labs today reviewed.  Patient tolerating treatment with mild to moderate difficulties-see below hypo-hyperthyroidism-      # Acute bronchitis- continue levaquin x 3/10 [PCP]; get CXR.   # Hypokalemia: Off hydrochlorothiazide - 3.3 potassium today.  On Kdur BID; recommend every other day hydrochlorothiazide monitor potassium closely.STABLE.   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 continue-vit D 50,000-STABLE.    # chronic back pain/chronic left LE/Bil PN-history of cervical spine injury-no evidence of metastatic disease.  Continue hydrocodone [for now as per Dr. Doy Hutching. STABLE.    #  Iatrogenic hypo-hyperthyroidism- [AUG 2023]-TSH-100. Currently on Synthroid 150 mcg/day-; SEP TSH 0.345. NOV 2023- TSH < 001. HOLD synthroid for now. Await TSH leveal from today.  # Headaches/ongoing fatigue- CT scan without and without contrast- rule out hypophysitis-negative for any acute process. STABLE.   # History of diabetes- continue metformin 500 mg BID; [place] 2023-Hb A1c-Hb A1C-5.9]. STABLE.    # Depression?anxiety-chronic; compounded by poor social support. DECLINES psychiatry evaluation.  Status post social work evaluation.  STABLE.  # DISPOSITION:  # CXR today # HOLD Atezoluzumab today.  # Follow up in 2 weeks MD; port/ labs- cbc/cmp; Atzeoluzimab; - Dr.B  .  Cc: Dr.A/Dr.Sparks         All questions were answered. The patient knows to call the clinic with any problems, questions or concerns.  Cammie Sickle, MD 09/03/2022 9:49 AM

## 2022-09-03 NOTE — Assessment & Plan Note (Addendum)
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 Currently s/p adjuvant carbo-taxol x4 [poor candidate for cisplatin].  PD-L1-5%; MARCH 15th, 2023-no evidence of any recurrent disease; stable mediastinal lymph node/previously PET negative; fibrotic/bronchitic changes noted bilateral lower lobes [ex-smoker]; currently on adjuvant Atezolizumab. STABLE.  Multiple interruptions given patient's overall general co-morbidities-unrelated to treatment. STABLE.   # HOLD Adjuvant Atezoluzimab # 10 of planned 13 [acute bronchitis- see below].   Labs today reviewed;  acceptable for treatment today. Labs today reviewed.  Patient tolerating treatment with mild to moderate difficulties-see below hypo-hyperthyroidism-      # Acute bronchitis- continue levaquin x 3/10 [PCP]; get CXR.   # Hypokalemia: Off hydrochlorothiazide - 3.3 potassium today.  On Kdur BID; recommend every other day hydrochlorothiazide monitor potassium closely.STABLE.   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 continue-vit D 50,000-STABLE.    # chronic back pain/chronic left LE/Bil PN-history of cervical spine injury-no evidence of metastatic disease.  Continue hydrocodone [for now as per Dr. Doy Hutching. STABLE.    # Iatrogenic hypo-hyperthyroidism- [AUG 2023]-TSH-100. Currently on Synthroid 150 mcg/day-; SEP TSH 0.345. NOV 2023- TSH < 001. HOLD synthroid for now. Await TSH leveal from today.  # Headaches/ongoing fatigue- CT scan without and without contrast- rule out hypophysitis-negative for any acute process. STABLE.   # History of diabetes- continue metformin 500 mg BID; [place] 2023-Hb A1c-Hb A1C-5.9]. STABLE.    # Depression?anxiety-chronic; compounded by poor social support. DECLINES psychiatry evaluation.  Status post social work evaluation.  STABLE.  # DISPOSITION:  # CXR today # HOLD Atezoluzumab today.  # Follow up in 2  weeks MD; port/ labs- cbc/cmp; Atzeoluzimab; - Dr.B  .  Cc: Dr.A/Dr.Sparks

## 2022-09-03 NOTE — Progress Notes (Signed)
Patient states she has had a cold for the last two weeks. She is currently on 2nd round of antibiotics (Levaquin 500mg )

## 2022-09-04 ENCOUNTER — Other Ambulatory Visit: Payer: Self-pay

## 2022-09-05 LAB — T4: T4, Total: 12 ug/dL (ref 4.5–12.0)

## 2022-09-06 ENCOUNTER — Telehealth: Payer: Self-pay | Admitting: *Deleted

## 2022-09-06 ENCOUNTER — Other Ambulatory Visit: Payer: Self-pay

## 2022-09-06 NOTE — Telephone Encounter (Signed)
Patient called reporting that she did not get the CXR you ordered for her wheezing. She states that she  is no longer wheezing and she feels it was from a cold she has as well as Asthma. She states that she does not have transportation to get her to have it done, but will be back in 2 weeks for her next treatment

## 2022-09-13 DIAGNOSIS — R0602 Shortness of breath: Secondary | ICD-10-CM | POA: Diagnosis not present

## 2022-09-15 ENCOUNTER — Other Ambulatory Visit: Payer: Self-pay

## 2022-09-16 ENCOUNTER — Telehealth: Payer: Self-pay | Admitting: Internal Medicine

## 2022-09-16 NOTE — Telephone Encounter (Signed)
Patient is scheduled for treatment tomorrow but states that she is sick (wheezing, headache, fatigue) and wants to reschedule. She mentioned she is getting a chest X-ray at another provider's office on Tuesday and would like Dr. B to review results prior to coming back in.   Please advise.

## 2022-09-17 ENCOUNTER — Inpatient Hospital Stay: Payer: Medicare HMO | Admitting: Internal Medicine

## 2022-09-17 ENCOUNTER — Inpatient Hospital Stay: Payer: Medicare HMO

## 2022-09-22 NOTE — Telephone Encounter (Signed)
I called patient to see if she has had her cxr and she said that she is going to go tomorrow

## 2022-09-23 DIAGNOSIS — R062 Wheezing: Secondary | ICD-10-CM | POA: Diagnosis not present

## 2022-09-29 ENCOUNTER — Telehealth: Payer: Self-pay | Admitting: *Deleted

## 2022-09-29 NOTE — Telephone Encounter (Signed)
Patient had her CXR last Thursday and was told to contact our office regarding next steps as it is suggesting that a CT be done. I have printed a copy of the report from the [facility] system for you

## 2022-09-30 ENCOUNTER — Telehealth: Payer: Self-pay | Admitting: Internal Medicine

## 2022-09-30 ENCOUNTER — Encounter: Payer: Self-pay | Admitting: Internal Medicine

## 2022-09-30 DIAGNOSIS — C3411 Malignant neoplasm of upper lobe, right bronchus or lung: Secondary | ICD-10-CM

## 2022-09-30 NOTE — Telephone Encounter (Addendum)
Call returned to patient and it went to voice mail, I left message that doctor has ordered for a CT to be done and that he would like to see if she is interested in resuming her treatments or not and to please return my call

## 2022-09-30 NOTE — Telephone Encounter (Signed)
Patient called back and said that yes she is interested on resuming more treatment if after her CT Dr B deems it necessary.

## 2022-09-30 NOTE — Telephone Encounter (Signed)
Reviewed the chest x-ray from [facility] clinic.  Given the abnormalities noted on the chest x-ray recommend CT scan of the chest for further evaluation.  Last CT chest March 2023.  Recommend follow-up after the CT scan chest plus minus treatment.   Discussed with heather smith-will inform patient about the scan/and if patient is interested in resuming treatments.  GB

## 2022-09-30 NOTE — Telephone Encounter (Signed)
CT scheduled on 1/2.  What/when would you like scheduled?

## 2022-10-01 ENCOUNTER — Other Ambulatory Visit: Payer: Self-pay

## 2022-10-01 ENCOUNTER — Other Ambulatory Visit: Payer: Self-pay | Admitting: Internal Medicine

## 2022-10-01 ENCOUNTER — Encounter: Payer: Self-pay | Admitting: Internal Medicine

## 2022-10-01 DIAGNOSIS — D491 Neoplasm of unspecified behavior of respiratory system: Secondary | ICD-10-CM

## 2022-10-01 NOTE — Telephone Encounter (Signed)
Dr. B would like pt scheduled on 1/12- MD; labs- cbc/cmp;TSH; atzeo- IS updated

## 2022-10-01 NOTE — Progress Notes (Signed)
Please schedule treatment to 1/12- GB  Messg- sent

## 2022-10-02 ENCOUNTER — Other Ambulatory Visit: Payer: Self-pay

## 2022-10-05 ENCOUNTER — Ambulatory Visit
Admission: RE | Admit: 2022-10-05 | Discharge: 2022-10-05 | Disposition: A | Discharge status: Home / Self Care | Payer: Medicare HMO | Source: Ambulatory Visit | Attending: Internal Medicine | Admitting: Internal Medicine

## 2022-10-05 DIAGNOSIS — C3490 Malignant neoplasm of unspecified part of unspecified bronchus or lung: Secondary | ICD-10-CM | POA: Diagnosis not present

## 2022-10-05 DIAGNOSIS — R918 Other nonspecific abnormal finding of lung field: Secondary | ICD-10-CM | POA: Diagnosis not present

## 2022-10-05 DIAGNOSIS — C3411 Malignant neoplasm of upper lobe, right bronchus or lung: Secondary | ICD-10-CM | POA: Diagnosis not present

## 2022-10-05 MED ORDER — IOHEXOL 300 MG/ML  SOLN
75.0000 mL | Freq: Once | INTRAMUSCULAR | Status: AC | PRN
  Administered 2022-10-05: 75 mL via INTRAVENOUS

## 2022-10-06 ENCOUNTER — Telehealth: Payer: Self-pay | Admitting: Internal Medicine

## 2022-10-06 NOTE — Telephone Encounter (Signed)
Patient would like a call to see of she can get the results of the CT scan

## 2022-10-07 NOTE — Telephone Encounter (Signed)
Dr. B notified patient via MyChart message that CT scan does not show any evidence of worsening cancer.  Recommend follow-up as planned, will discuss in detail at next visit.

## 2022-10-08 ENCOUNTER — Other Ambulatory Visit: Payer: Self-pay

## 2022-10-15 ENCOUNTER — Inpatient Hospital Stay: Payer: Medicare HMO | Admitting: Internal Medicine

## 2022-10-15 ENCOUNTER — Encounter: Payer: Self-pay | Admitting: Internal Medicine

## 2022-10-15 ENCOUNTER — Inpatient Hospital Stay: Payer: Medicare HMO

## 2022-10-15 ENCOUNTER — Inpatient Hospital Stay: Payer: Medicare HMO | Attending: Internal Medicine

## 2022-10-15 VITALS — BP 111/68 | HR 76 | Temp 97.8°F | Resp 18 | Wt 184.0 lb

## 2022-10-15 DIAGNOSIS — J449 Chronic obstructive pulmonary disease, unspecified: Secondary | ICD-10-CM | POA: Insufficient documentation

## 2022-10-15 DIAGNOSIS — I2510 Atherosclerotic heart disease of native coronary artery without angina pectoris: Secondary | ICD-10-CM | POA: Insufficient documentation

## 2022-10-15 DIAGNOSIS — D491 Neoplasm of unspecified behavior of respiratory system: Secondary | ICD-10-CM | POA: Diagnosis not present

## 2022-10-15 DIAGNOSIS — E039 Hypothyroidism, unspecified: Secondary | ICD-10-CM | POA: Diagnosis not present

## 2022-10-15 DIAGNOSIS — C3411 Malignant neoplasm of upper lobe, right bronchus or lung: Secondary | ICD-10-CM | POA: Diagnosis not present

## 2022-10-15 DIAGNOSIS — E876 Hypokalemia: Secondary | ICD-10-CM | POA: Insufficient documentation

## 2022-10-15 DIAGNOSIS — E119 Type 2 diabetes mellitus without complications: Secondary | ICD-10-CM | POA: Diagnosis not present

## 2022-10-15 DIAGNOSIS — Z5112 Encounter for antineoplastic immunotherapy: Secondary | ICD-10-CM | POA: Diagnosis not present

## 2022-10-15 LAB — COMPREHENSIVE METABOLIC PANEL
ALT: 15 U/L (ref 0–44)
AST: 28 U/L (ref 15–41)
Albumin: 3.6 g/dL (ref 3.5–5.0)
Alkaline Phosphatase: 53 U/L (ref 38–126)
Anion gap: 9 (ref 5–15)
BUN: 11 mg/dL (ref 8–23)
CO2: 28 mmol/L (ref 22–32)
Calcium: 8.7 mg/dL — ABNORMAL LOW (ref 8.9–10.3)
Chloride: 102 mmol/L (ref 98–111)
Creatinine, Ser: 0.54 mg/dL (ref 0.44–1.00)
GFR, Estimated: >60 mL/min (ref >60)
Glucose, Bld: 129 mg/dL — ABNORMAL HIGH (ref 70–99)
Potassium: 3.5 mmol/L (ref 3.5–5.1)
Sodium: 139 mmol/L (ref 135–145)
Total Bilirubin: 0.3 mg/dL (ref 0.3–1.2)
Total Protein: 6.8 g/dL (ref 6.5–8.1)

## 2022-10-15 LAB — CBC WITH DIFFERENTIAL/PLATELET
Abs Immature Granulocytes: 0.01 10*3/uL (ref 0.00–0.07)
Basophils Absolute: 0.1 10*3/uL (ref 0.0–0.1)
Basophils Relative: 1 %
Eosinophils Absolute: 0.9 10*3/uL — ABNORMAL HIGH (ref 0.0–0.5)
Eosinophils Relative: 13 %
HCT: 36.4 % (ref 36.0–46.0)
Hemoglobin: 12.2 g/dL (ref 12.0–15.0)
Immature Granulocytes: 0 %
Lymphocytes Relative: 36 %
Lymphs Abs: 2.6 10*3/uL (ref 0.7–4.0)
MCH: 29.3 pg (ref 26.0–34.0)
MCHC: 33.5 g/dL (ref 30.0–36.0)
MCV: 87.5 fL (ref 80.0–100.0)
Monocytes Absolute: 0.6 10*3/uL (ref 0.1–1.0)
Monocytes Relative: 8 %
Neutro Abs: 3 10*3/uL (ref 1.7–7.7)
Neutrophils Relative %: 42 %
Platelets: 152 10*3/uL (ref 150–400)
RBC: 4.16 MIL/uL (ref 3.87–5.11)
RDW: 12.8 % (ref 11.5–15.5)
WBC: 7.1 10*3/uL (ref 4.0–10.5)
nRBC: 0 % (ref 0.0–0.2)

## 2022-10-15 LAB — TSH: TSH: 0.021 u[IU]/mL — ABNORMAL LOW (ref 0.350–4.500)

## 2022-10-15 MED ORDER — SODIUM CHLORIDE 0.9% FLUSH
10.0000 mL | INTRAVENOUS | Status: DC | PRN
  Administered 2022-10-15: 10 mL
  Filled 2022-10-15: qty 10

## 2022-10-15 MED ORDER — HEPARIN SOD (PORK) LOCK FLUSH 100 UNIT/ML IV SOLN
500.0000 [IU] | Freq: Once | INTRAVENOUS | Status: AC | PRN
  Administered 2022-10-15: 500 [IU]
  Filled 2022-10-15: qty 5

## 2022-10-15 MED ORDER — SODIUM CHLORIDE 0.9 % IV SOLN
1200.0000 mg | Freq: Once | INTRAVENOUS | Status: AC
  Administered 2022-10-15: 1200 mg via INTRAVENOUS
  Filled 2022-10-15: qty 20

## 2022-10-15 MED ORDER — SODIUM CHLORIDE 0.9 % IV SOLN
Freq: Once | INTRAVENOUS | Status: AC
  Filled 2022-10-15: qty 250

## 2022-10-15 NOTE — Assessment & Plan Note (Addendum)
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 Currently s/p adjuvant carbo-taxol x4 [poor candidate for cisplatin].  PD-L1-5%; MARCH 15th, 2023-no evidence of any recurrent disease; stable mediastinal lymph node/previously PET negative; fibrotic/bronchitic changes noted bilateral lower lobes [ex-smoker]; currently on adjuvant Atezolizumab. STABLE.  Multiple interruptions given patient's overall general co-morbidities-unrelated to treatment. STABLE.   CT JAN 2024-  Stable exam. No new or progressive findings to suggest recurrent or metastatic disease;  The mildly enlarged precarinal lymph node seen is overall stable.  Will plan to continue surveillance imaging every 3 to 6 months.   # proceed  Adjuvant Atezoluzimab # 10 of planned 13  Labs today reviewed;  acceptable for treatment today. Labs today reviewed.  Patient tolerating treatment with mild to moderate difficulties-see below hypo-hyperthyroidism-      # Hypokalemia: Off hydrochlorothiazide - On Kdur BID- 3.5- stable.   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 continue-vit D 50,000-stable.   # chronic back pain/chronic left LE/Bil PN-history of cervical spine injury-no evidence of metastatic disease.  Continue hydrocodone [for now as per Dr. Judithann Sheen. stable.   # Iatrogenic hypo-hyperthyroidism- [AUG 2023]-TSH-100. Currently on Synthroid 150 mcg/day-; SEP TSH 0.345. DEC 2023- synthoird- 100 mg/day; monitor for now. stable.   # Headaches/ongoing fatigue- CT scan without and without contrast- rule out hypophysitis-negative for any acute process. stable.   # History of diabetes- continue metformin 500 mg BID; [MARCH 2023-Hb A1c-Hb A1C-5.9]. stable.  [place] as per pt.     #Incidental findings on Imaging JAN 2024- Subtle nodularity of liver contour raises the question of cirrhosis;  Aortic Atherosclerosis; 2. Enlargement of the  pulmonary outflow tract/main pulmonary arteries suggests pulmonary arterial hypertension.reviewed/discussed/counseled the patient.   # DISPOSITION:  # proceed with Atezoluzumab today.  # Follow up in 3 weeks MD; port/ labs- cbc/cmp; thyroid profile- Atzeoluzimab; - Dr.B   # I reviewed the blood work- with the patient in detail; also reviewed the imaging independently [as summarized above]; and with the patient in detail.   Marland Kitchen  Cc: Dr.A/Dr.Sparks

## 2022-10-15 NOTE — Progress Notes (Signed)
Patient would like to discuss CT results.  When taking Metformin she has uncontrollable diarrhea.  Tolerated Metformin before she received treatments and is not taking now due to the diarrhea.  Metformin is prescribed by Dr. Judithann Sheen and she has not contacted that office.    Bilateral upper arm pain with episodes of 8-9/10 pain scale.  No acute pain today.  Does get nauseated that is controlled with Compazine.

## 2022-10-15 NOTE — Patient Instructions (Signed)
[facility] CANCER CTR AT [place]-[facility]ICAL ONCOLOGY  Discharge Instructions: Thank you for choosing [facility] to provide your oncology and hematology care.  If you have a lab appointment with the Cancer Center, please go directly to the Cancer Center and check in at the registration area.  Wear comfortable clothing and clothing appropriate for easy access to any Portacath or PICC line.   We strive to give you quality time with your provider. You may need to reschedule your appointment if you arrive late (15 or more minutes).  Arriving late affects you and other patients whose appointments are after yours.  Also, if you miss three or more appointments without notifying the office, you may be dismissed from the clinic at the provider's discretion.      For prescription refill requests, have your pharmacy contact our office and allow 72 hours for refills to be completed.    Today you received the following chemotherapy and/or immunotherapy agents- tecentriq      To help prevent nausea and vomiting after your treatment, we encourage you to take your nausea medication as directed.  BELOW ARE SYMPTOMS THAT SHOULD BE REPORTED IMMEDIATELY: *FEVER GREATER THAN 100.4 F (38 C) OR HIGHER *CHILLS OR SWEATING *NAUSEA AND VOMITING THAT IS NOT CONTROLLED WITH YOUR NAUSEA MEDICATION *UNUSUAL SHORTNESS OF BREATH *UNUSUAL BRUISING OR BLEEDING *URINARY PROBLEMS (pain or burning when urinating, or frequent urination) *BOWEL PROBLEMS (unusual diarrhea, constipation, pain near the anus) TENDERNESS IN MOUTH AND THROAT WITH OR WITHOUT PRESENCE OF ULCERS (sore throat, sores in mouth, or a toothache) UNUSUAL RASH, SWELLING OR PAIN  UNUSUAL VAGINAL DISCHARGE OR ITCHING   Items with * indicate a potential emergency and should be followed up as soon as possible or go to the Emergency Department if any problems should occur.  Please show the CHEMOTHERAPY ALERT CARD or IMMUNOTHERAPY ALERT CARD at check-in to  the Emergency Department and triage nurse.  Should you have questions after your visit or need to cancel or reschedule your appointment, please contact [facility] CANCER CTR AT [place] ONCOL[facility]  [phone] and follow the prompts.  Office hours are 8:00 a.m. to 4:30 p.m. Monday - Friday. Please note that voicemails left after 4:00 p.m. may not be returned until the following business day.  We are closed weekends and major holidays. You have access to a nurse at all times for urgent questions. Please call the main number to the clinic [phone] and follow the prompts.  For any non-urgent questions, you may also contact your provider using MyChart. We now offer e-Visits for anyone 36 and older to request care online for non-urgent symptoms. For details visit [email or web address].   Also download the MyChart app! Go to the app store, search "MyChart", open the app, select [place], and log in with your MyChart username and password.  Masks are optional in the cancer centers. If you would like for your care team to wear a mask while they are taking care of you, please let them know. For doctor visits, patients may have with them one support person who is at least 70 years old. At this time, visitors are not allowed in the infusion area.

## 2022-10-15 NOTE — Progress Notes (Signed)
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 Spiculated mass of the posterior right pulmonary apex measuring 3.2 x 2.8 cm, consistent with primary lung malignancy. 2. Enlarged right hilar and pretracheal lymph nodes measuring up to 1.6 x 1.5 cm, suspicious for nodal metastatic disease. 3. Recommend multidisciplinary thoracic referral for consideration of PET-CT metabolic characterization and tissue sampling. 4. Background of very fine centrilobular nodularity throughout the lungs, most concentrated at the apices. Scattered bronchiolar plugging. Findings are most consistent with smoking-related respiratory bronchiolitis. 5. Coarse, nodular contour of the liver in the included upper abdomen, suggestive of cirrhosis. 6. Coronary artery disease.  # COPD-  # DM- diet controlled; chronic back pain- hydrocodone 5 day; chronic left lower extremity weakness.  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 Currently on Synthroid 150 mcg/day-  # NGS/MOLECULAR TESTS:    # PALLIATIVE CARE EVALUATION:  # PAIN MANAGEMENT:    DIAGNOSIS:   STAGE:         ;  GOALS:  CURRENT/MOST RECENT THERAPY :    A. LYMPH NODE, HILAR, EXCISION:  - Lymph node negative for metastatic carcinoma.   B. LYMPH NODE, LEVEL 4, EXCISION:  - Lymph node negative for metastatic carcinoma.   C. LUNG, RIGHT UPPER LOBE, LOBECTOMY:  - Squamous cell carcinoma, 4.5 cm.  - Carcinoma extends through visceral pleura and focally involves  parietal pleura.  -  Surgical margins negative for carcinoma.  - One lymph node negative for carcinoma.  - See oncology table and comment.   D. SOFT TISSUE, CHEST WALL, BIOPSY:  - Fibroadipose tissue with focal inflammation and fibrosis.  - No carcinoma identified.   ONCOLOGY TABLE:  LUNG: Resection  Synchronous Tumors: Not applicable.  Total Number of Primary Tumors: 1  Procedure: Right upper lobectomy with lymph node biopsies and chest wall  biopsy.  Specimen Laterality: Right upper lobe.  Tumor Focality: Unifocal.  Tumor Site: Right upper lobe.  Tumor Size: 4.5 x 4 x 3 cm.  Histologic Type: Squamous cell carcinoma.  Visceral Pleura Invasion: Present.  Direct Invasion of Adjacent Structures: Focal involvement of parietal  pleura.  Lymphovascular Invasion: Not identified.  Margins: All surgical margins negative for carcinoma.  Regional Lymph Nodes:       Number of Lymph Nodes Involved: 0                            Nodal Sites with Tumor: 0       Number of Lymph Nodes Examined: 3                       Nodal Sites Examined: 2  Pathologic Stage Classification (pTNM, AJCC 8th Edition): pT3, pN0  Ancillary Studies: Can be performed if requested.   #Right upper lobe stage IIb-pT3 [not cisplatin candidate]; CarboTaxol x4. April 11th, 2023-Atezo q 3 w x1 year  # AUG 2023- IATROGENIC HYPOTHYRODISM- TSH 100; START SYNTHROID   Malignant neoplasm of upper lobe of right lung ([place])  04/24/2021 Initial Diagnosis   Malignant neoplasm of  upper lobe of right lung (HCC)   05/02/2021 Cancer Staging   Staging form: Lung, AJCC 8th Edition - Clinical: Stage IB (cT2a, cN0, cM0) - Signed by Earna Coder, MD on 05/02/2021 Stage prefix: Initial diagnosis   07/23/2021 Cancer Staging   Staging form: Lung, AJCC 8th Edition - Pathologic: Stage IIB (pT3, pN0, cM0) - Signed by Earna Coder, MD on 07/23/2021 Stage prefix: Initial diagnosis    HISTORY OF PRESENTING ILLNESS:  Patient accompanied with her  daughter today.  in a wheel chair.   Athenia D Beam Olivia Buchanan 72 y.o.  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  Patient would like to discuss CT results.  When taking Metformin she has uncontrollable diarrhea.  Tolerated Metformin before she received treatments and is not taking now due to the diarrhea.    Chronic bilateral upper arm pain with episodes of 8-9/10 pain scale.  No acute pain today.  Intermittently patient does get nauseated that is controlled with Compazine.   Complains of ongoing chronic mild cough.  Shortness of with improvement; not resolved.  Complains of ongoing fatigue.   No worsening tingling or numbness.  Chronic back pain.   No chest pain.  Headaches improved.    Review of Systems  Constitutional:  Positive for malaise/fatigue and weight loss. Negative for chills, diaphoresis and fever.  HENT:  Negative for nosebleeds and sore throat.   Eyes:  Negative for double vision.  Respiratory:  Positive for cough and shortness of breath. Negative for hemoptysis and wheezing.   Cardiovascular:  Negative for chest pain, palpitations, orthopnea and leg swelling.  Gastrointestinal:  Negative for abdominal pain, blood in stool, constipation, diarrhea, heartburn, melena and vomiting.  Genitourinary:  Negative for dysuria, frequency and urgency.  Musculoskeletal:  Positive for back pain, joint pain and neck pain.  Skin: Negative.  Negative for itching and rash.  Neurological:  Negative for tingling, focal weakness and weakness.  Endo/Heme/Allergies:  Does not bruise/bleed easily.  Psychiatric/Behavioral:  Negative for depression. The patient is not nervous/anxious and does not have insomnia.      MEDICAL HISTORY:  Past Medical History:  Diagnosis Date   Angioedema 12/08/2019   Anxiety    Aortic atherosclerosis (HCC)    Asthma    CAD (coronary artery disease)    3 vessel   Cancer (HCC)    COPD (chronic  obstructive pulmonary disease) (HCC) 12/08/2019   DDD (degenerative disc disease), lumbar    Depression    Diabetes mellitus without complication (HCC)    no meds, diet controlled   Dyspnea    GERD (gastroesophageal reflux disease)    HTN (hypertension) 12/08/2019   Hyperlipidemia    Hypertension    Mass of upper lobe of right lung 04/02/2021   spiculated mass RIGHT posterior pulmonary apex with associated hilar and pretracheal LAD; measured 3.2 x 2.8 cm   Obesity (BMI 30-39.9)    T2DM (type 2 diabetes mellitus) (HCC) 12/08/2019    SURGICAL HISTORY: Past Surgical History:  Procedure Laterality Date   BACK SURGERY     lumbar L5-S1 ruptured disc x 3 surgeries   CERVICAL FUSION  2009   FINE NEEDLE ASPIRATION  06/23/2021   Procedure: FINE NEEDLE ASPIRATION (FNA) LINEAR;  Surgeon: Josephine Igo, DO;  Location: [facility] ENDOSCOPY;  Service: Pulmonary;;   INTERCOSTAL NERVE BLOCK Right 07/09/2021   Procedure: INTERCOSTAL NERVE BLOCK;  Surgeon: Corliss Skains, MD;  Location: [facility] OR;  Service: Thoracic;  Laterality: Right;   IR IMAGING GUIDED PORT INSERTION  08/06/2021   LAPAROSCOPIC TOTAL HYSTERECTOMY  1998   with oophorectomy   LYMPH NODE DISSECTION Right 07/09/2021   Procedure: LYMPH NODE DISSECTION;  Surgeon: Corliss Skains, MD;  Location: [facility] OR;  Service: Thoracic;  Laterality: Right;   TONSILLECTOMY     VIDEO BRONCHOSCOPY WITH ENDOBRONCHIAL NAVIGATION N/A 04/15/2021   Procedure: VIDEO BRONCHOSCOPY WITH ENDOBRONCHIAL NAVIGATION;  Surgeon: Vida Rigger, MD;  Location: [facility] ORS;  Service: Thoracic;  Laterality: N/A;   VIDEO BRONCHOSCOPY WITH ENDOBRONCHIAL ULTRASOUND N/A 04/15/2021   Procedure: VIDEO BRONCHOSCOPY WITH ENDOBRONCHIAL ULTRASOUND;  Surgeon: Vida Rigger, MD;  Location: [facility] ORS;  Service: Thoracic;  Laterality: N/A;   VIDEO BRONCHOSCOPY WITH ENDOBRONCHIAL ULTRASOUND N/A 06/23/2021   Procedure: VIDEO BRONCHOSCOPY WITH ENDOBRONCHIAL ULTRASOUND;  Surgeon: Josephine Igo, DO;  Location: [facility] ENDOS[facility];  Service: Pulmonary;  Laterality: N/A;    SOCIAL HISTORY: Social History   Socioeconomic History   Marital status: Married    Spouse name: Dwight   Number of children: 3   Years of education: Not on file   Highest education level: Not on file  Occupational History   Not on file  Tobacco Use   Smoking status: Former    Packs/day: 3.00    Years: 50.00    Total pack years: 150.00    Types: Cigarettes    Quit date: 2019    Years since quitting: 5.0   Smokeless tobacco: Never  Vaping Use   Vaping Use: Never used  Substance and Sexual Activity   Alcohol use: Not Currently   Drug use: Never   Sexual activity: Not on file    Comment: Hysterectomy  Other Topics Concern   Not on file  Social History Narrative   Lives in home; with husband; [place]; never smoking 2019; no alcohol. 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    Social Determinants of Health   Financial Resource Strain: Not on file  Food Insecurity: Not on file  Transportation Needs: Not on file  Physical Activity: Not on file  Stress: Not on file  Social Connections: Not on file  Intimate Partner Violence: Not on file    FAMILY HISTORY: Family History  Problem Relation Age of Onset   Bone cancer Maternal Uncle     ALLERGIES:  is allergic to ibuprofen and macrobid [nitrofurantoin].  MEDICATIONS:  Current Outpatient Medications  Medication Sig Dispense Refill   albuterol (VENTOLIN HFA) 108 (90 Base) MCG/ACT inhaler Inhale 1 puff into the lungs every 6 (six) hours as needed for wheezing.     aspirin EC 81 MG tablet Take 81 mg by mouth at bedtime.     Biotin 1000 MCG tablet Take 1,000 mcg by mouth 3 (three) times daily.     clonazePAM (KLONOPIN) 0.5 MG tablet Take 0.5 mg by mouth in the morning, at noon, and at bedtime.     diphenoxylate-atropine (LOMOTIL) 2.5-0.025 MG tablet Take by mouth.     EPINEPHrine 0.3 mg/0.3 mL IJ SOAJ injection Inject  0.3 mLs (0.3 mg total) into the muscle as needed for anaphylaxis. 2 each 0   ergocalciferol (VITAMIN D2) 1.25 MG (50000 UT) capsule Take 1 capsule (50,000 Units total) by mouth once a week. 12 capsule 1   fluticasone-salmeterol (ADVAIR) 250-50 MCG/ACT AEPB Inhale 1 puff into the lungs in the morning and at bedtime.     gabapentin (NEURONTIN) 300 MG capsule Take 300 mg by mouth  3 (three) times daily. Take 300 mg qam and lunch and take 600 mg qhs     hydrochlorothiazide (HYDRODIURIL) 25 MG tablet Take 25 mg by mouth every other day.     HYDROcodone-acetaminophen (NORCO) 10-325 MG tablet Take 1 tablet by mouth every 6 (six) hours as needed.     ipratropium-albuterol (DUONEB) 0.5-2.5 (3) MG/3ML SOLN Take 3 mLs by nebulization 3 (three) times daily.     levothyroxine (SYNTHROID) 125 MCG tablet TAKE 1 TABLET(125 MCG) BY MOUTH DAILY BEFORE BREAKFAST 90 tablet 1   lidocaine-prilocaine (EMLA) cream Apply topically once.     Melatonin 10 MG TABS Take 10 mg by mouth at bedtime.     Multiple Vitamin (MULTI-VITAMIN) tablet Take 1 tablet by mouth at bedtime.     nystatin cream (MYCOSTATIN) Apply 1 application topically 2 (two) times daily as needed for dry skin (around lips).     pantoprazole (PROTONIX) 40 MG tablet Take 40 mg by mouth at bedtime.     potassium chloride SA (KLOR-CON) 20 MEQ tablet Take 20 mEq by mouth 2 (two) times daily.     pravastatin (PRAVACHOL) 80 MG tablet Take 80 mg by mouth at bedtime.     prochlorperazine (COMPAZINE) 10 MG tablet Take 1 tablet (10 mg total) by mouth every 6 (six) hours as needed (Nausea or vomiting). 30 tablet 1   tiZANidine (ZANAFLEX) 4 MG tablet Take 4 mg by mouth 3 (three) times daily.     traZODone (DESYREL) 100 MG tablet Take 50 mg by mouth at bedtime.     venlafaxine XR (EFFEXOR-XR) 75 MG 24 hr capsule Take 150 mg by mouth at bedtime.     docusate sodium (COLACE) 100 MG capsule Take 200 mg by mouth at bedtime. (Patient not taking: Reported on 10/15/2022)      metFORMIN (GLUCOPHAGE) 500 MG tablet TAKE 1 TABLET(500 MG) BY MOUTH TWICE DAILY WITH A MEAL (Patient not taking: Reported on 10/15/2022) 60 tablet 3   No current facility-administered medications for this visit.   Facility-Administered Medications Ordered in Other Visits  Medication Dose Route Frequency Provider Last Rate Last Admin   heparin lock flush 100 UNIT/ML injection            sodium chloride flush (NS) 0.9 % injection 10 mL  10 mL Intravenous Once Louretta Shorten R, MD          .  PHYSICAL EXAMINATION: ECOG PERFORMANCE STATUS: 1 - Symptomatic but completely ambulatory  Vitals:   10/15/22 1300  BP: 111/68  Pulse: 76  Resp: 18  Temp: 97.8 F (36.6 C)  SpO2: 96%   Filed Weights   10/15/22 1300  Weight: 184 lb (83.5 kg)    Physical Exam Vitals and nursing note reviewed.  HENT:     Head: Normocephalic and atraumatic.     Mouth/Throat:     Mouth: Mucous membranes are moist.     Pharynx: Oropharynx is clear. No oropharyngeal exudate.  Eyes:     Extraocular Movements: Extraocular movements intact.     Pupils: Pupils are equal, round, and reactive to light.  Cardiovascular:     Rate and Rhythm: Normal rate and regular rhythm.  Pulmonary:     Effort: No respiratory distress.     Breath sounds: No wheezing.     Comments: Decreased breath sounds bilaterally at bases.  No wheeze or crackles Abdominal:     General: Bowel sounds are normal. There is no distension.     Palpations: Abdomen is soft.  There is no mass.     Tenderness: There is no abdominal tenderness. There is no guarding or rebound.  Musculoskeletal:        General: No tenderness. Normal range of motion.     Cervical back: Normal range of motion and neck supple.  Skin:    General: Skin is warm.  Neurological:     General: No focal deficit present.     Mental Status: She is alert and oriented to person, place, and time.     Comments: 4-5 strength in the left lower extremity/chronic.  Psychiatric:         Mood and Affect: Affect normal.        Behavior: Behavior normal.        Judgment: Judgment normal.      LABORATORY DATA:  I have reviewed the data as listed Lab Results  Component Value Date   WBC 7.1 10/15/2022   HGB 12.2 10/15/2022   HCT 36.4 10/15/2022   MCV 87.5 10/15/2022   PLT 152 10/15/2022   Recent Labs    08/13/22 0924 09/03/22 0837 10/15/22 1231  NA 137 136 139  K 3.3* 3.3* 3.5  CL 101 99 102  CO2 26 26 28   GLUCOSE 180* 248* 129*  BUN 11 10 11   CREATININE 0.66 0.63 0.54  CALCIUM 9.2 8.6* 8.7*  GFRNONAA >60 >60 >60  PROT 7.2 7.3 6.8  ALBUMIN 3.7 3.1* 3.6  AST 32 28 28  ALT 20 23 15   ALKPHOS 47 60 53  BILITOT 0.6 0.4 0.3    RADIOGRAPHIC STUDIES: I have personally reviewed the radiological images as listed and agreed with the findings in the report. CT CHEST W CONTRAST  Result Date: 10/07/2022 CLINICAL DATA:  Non-small-cell lung cancer. Restaging. * Tracking Code: BO * EXAM: CT CHEST WITH CONTRAST TECHNIQUE: Multidetector CT imaging of the chest was performed during intravenous contrast administration. RADIATION DOSE REDUCTION: This exam was performed according to the departmental dose-optimization program which includes automated exposure control, adjustment of the mA and/or kV according to patient size and/or use of iterative reconstruction technique. CONTRAST:  52mL OMNIPAQUE IOHEXOL 300 MG/ML  SOLN COMPARISON:  12/16/2021 FINDINGS: Cardiovascular: The heart size is normal. No substantial pericardial effusion. Coronary artery calcification is evident. Mild atherosclerotic calcification is noted in the wall of the thoracic aorta. Right Port-A-Cath tip is positioned in the right atrium. Enlargement of the pulmonary outflow tract/main pulmonary arteries suggests pulmonary arterial hypertension. Mediastinum/Nodes: 13 mm short axis precarinal lymph node measured previously is 10 mm short axis today on image 46/2. No mediastinal lymphadenopathy. There is no hilar  lymphadenopathy. There is no axillary lymphadenopathy. Lungs/Pleura: Surgical changes are again noted in the region of the right hilum. Chronic interstitial and alveolar opacity in the peripheral right lung base is stable in the interval. Subpleural reticulation noted in both lungs, basilar predominant. No new suspicious pulmonary nodule or mass. No focal airspace consolidation. There is no evidence of pleural effusion. Upper Abdomen: Subtle nodularity of liver contour (lateral segment left liver image 148/2) raises the question of cirrhosis. No adrenal nodule or mass. Musculoskeletal: No worrisome lytic or sclerotic osseous abnormality. IMPRESSION: 1. Stable exam. No new or progressive findings to suggest recurrent or metastatic disease. 2. Enlargement of the pulmonary outflow tract/main pulmonary arteries suggests pulmonary arterial hypertension. 3. The mildly enlarged precarinal lymph node seen previously has decreased in size in the interval. 4. Subtle nodularity of liver contour raises the question of cirrhosis. 5.  Aortic Atherosclerosis (ICD10-I70.0).  Electronically Signed   By: Kennith Center M.D.   On: 10/07/2022 08:02    ASSESSMENT & PLAN:   Malignant neoplasm of upper lobe of right lung (HCC) # pT3N0 [4.5cm; Squamous cell ca- + involvement of the parietal pleura]; stage II. Currently s/p adjuvant carbo-taxol x4 [poor candidate for cisplatin].  PD-L1-5%; MARCH 15th, 2023-no evidence of any recurrent disease; stable mediastinal lymph node/previously PET negative; fibrotic/bronchitic changes noted bilateral lower lobes [ex-smoker]; currently on adjuvant Atezolizumab. STABLE.  Multiple interruptions given patient's overall general co-morbidities-unrelated to treatment. STABLE.   CT JAN 2024-  Stable exam. No new or progressive findings to suggest recurrent or metastatic disease;  The mildly enlarged precarinal lymph node seen is overall stable.  Will plan to continue surveillance imaging every 3 to 6  months.   # proceed  Adjuvant Atezoluzimab # 10 of planned 13 [acute bronchitis- see below].   Labs today reviewed;  acceptable for treatment today. Labs today reviewed.  Patient tolerating treatment with mild to moderate difficulties-see below hypo-hyperthyroidism-      # Hypokalemia: Off hydrochlorothiazide - On Kdur BID- 3.5- stable.   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 continue-vit D 50,000-stable.   # chronic back pain/chronic left LE/Bil PN-history of cervical spine injury-no evidence of metastatic disease.  Continue hydrocodone [for now as per Dr. Judithann Sheen. stable.   # Iatrogenic hypo-hyperthyroidism- [AUG 2023]-TSH-100. Currently on Synthroid 150 mcg/day-; SEP TSH 0.345. DEC 2023- synthoird- 100 mg/day; monitor for now. stable.   # Headaches/ongoing fatigue- CT scan without and without contrast- rule out hypophysitis-negative for any acute process. stable.   # History of diabetes- continue metformin 500 mg BID; [MARCH 2023-Hb A1c-Hb A1C-5.9]. stable.  [place] as per pt.     #Incidental findings on Imaging JAN 2024- Subtle nodularity of liver contour raises the question of cirrhosis;  Aortic Atherosclerosis; 2. Enlargement of the pulmonary outflow tract/main pulmonary arteries suggests pulmonary arterial hypertension.reviewed/discussed/counseled the patient.   # DISPOSITION:  # proceed with Atezoluzumab today.  # Follow up in 3 weeks MD; port/ labs- cbc/cmp; thyroid profile- Atzeoluzimab; - Dr.B   # I reviewed the blood work- with the patient in detail; also reviewed the imaging independently [as summarized above]; and with the patient in detail.   Marland Kitchen  Cc: Dr.A/Dr.Sparks   All questions were answered. The patient knows to call the clinic with any problems, questions or concerns.    Earna Coder, MD 10/25/2022 8:46 AM

## 2022-10-16 ENCOUNTER — Other Ambulatory Visit: Payer: Self-pay

## 2022-10-16 LAB — T4: T4, Total: 9.3 ug/dL (ref 4.5–12.0)

## 2022-10-20 ENCOUNTER — Other Ambulatory Visit: Payer: Self-pay

## 2022-10-25 ENCOUNTER — Encounter: Payer: Self-pay | Admitting: Internal Medicine

## 2022-10-26 ENCOUNTER — Other Ambulatory Visit: Payer: Self-pay

## 2022-11-05 ENCOUNTER — Inpatient Hospital Stay: Payer: Medicare HMO | Attending: Internal Medicine | Admitting: Internal Medicine

## 2022-11-05 ENCOUNTER — Encounter: Payer: Self-pay | Admitting: Internal Medicine

## 2022-11-05 ENCOUNTER — Other Ambulatory Visit: Payer: Self-pay | Admitting: Internal Medicine

## 2022-11-05 ENCOUNTER — Inpatient Hospital Stay: Payer: Medicare HMO

## 2022-11-05 VITALS — BP 110/70 | HR 79 | Temp 97.1°F | Resp 18 | Ht 59.0 in | Wt 183.0 lb

## 2022-11-05 DIAGNOSIS — G8929 Other chronic pain: Secondary | ICD-10-CM | POA: Diagnosis not present

## 2022-11-05 DIAGNOSIS — D491 Neoplasm of unspecified behavior of respiratory system: Secondary | ICD-10-CM

## 2022-11-05 DIAGNOSIS — E119 Type 2 diabetes mellitus without complications: Secondary | ICD-10-CM | POA: Diagnosis not present

## 2022-11-05 DIAGNOSIS — J4489 Other specified chronic obstructive pulmonary disease: Secondary | ICD-10-CM | POA: Insufficient documentation

## 2022-11-05 DIAGNOSIS — Z79620 Long term (current) use of immunosuppressive biologic: Secondary | ICD-10-CM | POA: Diagnosis not present

## 2022-11-05 DIAGNOSIS — M549 Dorsalgia, unspecified: Secondary | ICD-10-CM | POA: Diagnosis not present

## 2022-11-05 DIAGNOSIS — E032 Hypothyroidism due to medicaments and other exogenous substances: Secondary | ICD-10-CM | POA: Diagnosis not present

## 2022-11-05 DIAGNOSIS — C3411 Malignant neoplasm of upper lobe, right bronchus or lung: Secondary | ICD-10-CM | POA: Diagnosis not present

## 2022-11-05 DIAGNOSIS — Z79899 Other long term (current) drug therapy: Secondary | ICD-10-CM | POA: Insufficient documentation

## 2022-11-05 DIAGNOSIS — E876 Hypokalemia: Secondary | ICD-10-CM | POA: Diagnosis not present

## 2022-11-05 DIAGNOSIS — G620 Drug-induced polyneuropathy: Secondary | ICD-10-CM | POA: Diagnosis not present

## 2022-11-05 DIAGNOSIS — Z5112 Encounter for antineoplastic immunotherapy: Secondary | ICD-10-CM | POA: Diagnosis not present

## 2022-11-05 DIAGNOSIS — Z79890 Hormone replacement therapy (postmenopausal): Secondary | ICD-10-CM | POA: Insufficient documentation

## 2022-11-05 LAB — COMPREHENSIVE METABOLIC PANEL
ALT: 18 U/L (ref 0–44)
AST: 30 U/L (ref 15–41)
Albumin: 3.9 g/dL (ref 3.5–5.0)
Alkaline Phosphatase: 64 U/L (ref 38–126)
Anion gap: 9 (ref 5–15)
BUN: 9 mg/dL (ref 8–23)
CO2: 28 mmol/L (ref 22–32)
Calcium: 8.9 mg/dL (ref 8.9–10.3)
Chloride: 102 mmol/L (ref 98–111)
Creatinine, Ser: 0.64 mg/dL (ref 0.44–1.00)
GFR, Estimated: >60 mL/min (ref >60)
Glucose, Bld: 171 mg/dL — ABNORMAL HIGH (ref 70–99)
Potassium: 3.4 mmol/L — ABNORMAL LOW (ref 3.5–5.1)
Sodium: 139 mmol/L (ref 135–145)
Total Bilirubin: 0.2 mg/dL — ABNORMAL LOW (ref 0.3–1.2)
Total Protein: 7.5 g/dL (ref 6.5–8.1)

## 2022-11-05 LAB — CBC WITH DIFFERENTIAL/PLATELET
Abs Immature Granulocytes: 0.02 10*3/uL (ref 0.00–0.07)
Basophils Absolute: 0.1 10*3/uL (ref 0.0–0.1)
Basophils Relative: 1 %
Eosinophils Absolute: 0.6 10*3/uL — ABNORMAL HIGH (ref 0.0–0.5)
Eosinophils Relative: 10 %
HCT: 36.1 % (ref 36.0–46.0)
Hemoglobin: 12.6 g/dL (ref 12.0–15.0)
Immature Granulocytes: 0 %
Lymphocytes Relative: 45 %
Lymphs Abs: 2.9 10*3/uL (ref 0.7–4.0)
MCH: 30.4 pg (ref 26.0–34.0)
MCHC: 34.9 g/dL (ref 30.0–36.0)
MCV: 87 fL (ref 80.0–100.0)
Monocytes Absolute: 0.6 10*3/uL (ref 0.1–1.0)
Monocytes Relative: 9 %
Neutro Abs: 2.2 10*3/uL (ref 1.7–7.7)
Neutrophils Relative %: 35 %
Platelets: 148 10*3/uL — ABNORMAL LOW (ref 150–400)
RBC: 4.15 MIL/uL (ref 3.87–5.11)
RDW: 13.2 % (ref 11.5–15.5)
WBC: 6.3 10*3/uL (ref 4.0–10.5)
nRBC: 0 % (ref 0.0–0.2)

## 2022-11-05 LAB — TSH: TSH: 0.032 u[IU]/mL — ABNORMAL LOW (ref 0.350–4.500)

## 2022-11-05 MED ORDER — HEPARIN SOD (PORK) LOCK FLUSH 100 UNIT/ML IV SOLN
500.0000 [IU] | Freq: Once | INTRAVENOUS | Status: AC | PRN
  Administered 2022-11-05: 500 [IU]
  Filled 2022-11-05: qty 5

## 2022-11-05 MED ORDER — SODIUM CHLORIDE 0.9 % IV SOLN
Freq: Once | INTRAVENOUS | Status: AC
  Filled 2022-11-05: qty 250

## 2022-11-05 MED ORDER — SODIUM CHLORIDE 0.9 % IV SOLN
1200.0000 mg | Freq: Once | INTRAVENOUS | Status: AC
  Administered 2022-11-05: 1200 mg via INTRAVENOUS
  Filled 2022-11-05: qty 20

## 2022-11-05 NOTE — Progress Notes (Signed)
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 Spiculated mass of the posterior right pulmonary apex measuring 3.2 x 2.8 cm, consistent with primary lung malignancy. 2. Enlarged right hilar and pretracheal lymph nodes measuring up to 1.6 x 1.5 cm, suspicious for nodal metastatic disease. 3. Recommend multidisciplinary thoracic referral for consideration of PET-CT metabolic characterization and tissue sampling. 4. Background of very fine centrilobular nodularity throughout the lungs, most concentrated at the apices. Scattered bronchiolar plugging. Findings are most consistent with smoking-related respiratory bronchiolitis. 5. Coarse, nodular contour of the liver in the included upper abdomen, suggestive of cirrhosis. 6. Coronary artery disease.  # COPD-  # DM- diet controlled; chronic back pain- hydrocodone 5 day; chronic left lower extremity weakness.  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 Currently on Synthroid 150 mcg/day-  # NGS/MOLECULAR TESTS:    # PALLIATIVE CARE EVALUATION:  # PAIN MANAGEMENT:    DIAGNOSIS:   STAGE:         ;  GOALS:  CURRENT/MOST RECENT THERAPY :    A. LYMPH NODE, HILAR, EXCISION:  - Lymph node negative for metastatic carcinoma.   B. LYMPH NODE, LEVEL 4, EXCISION:  - Lymph node negative for metastatic carcinoma.   C. LUNG, RIGHT UPPER LOBE, LOBECTOMY:  - Squamous cell carcinoma, 4.5 cm.  - Carcinoma extends through visceral pleura and focally involves  parietal pleura.  -  Surgical margins negative for carcinoma.  - One lymph node negative for carcinoma.  - See oncology table and comment.   D. SOFT TISSUE, CHEST WALL, BIOPSY:  - Fibroadipose tissue with focal inflammation and fibrosis.  - No carcinoma identified.   ONCOLOGY TABLE:  LUNG: Resection  Synchronous Tumors: Not applicable.  Total Number of Primary Tumors: 1  Procedure: Right upper lobectomy with lymph node biopsies and chest wall  biopsy.  Specimen Laterality: Right upper lobe.  Tumor Focality: Unifocal.  Tumor Site: Right upper lobe.  Tumor Size: 4.5 x 4 x 3 cm.  Histologic Type: Squamous cell carcinoma.  Visceral Pleura Invasion: Present.  Direct Invasion of Adjacent Structures: Focal involvement of parietal  pleura.  Lymphovascular Invasion: Not identified.  Margins: All surgical margins negative for carcinoma.  Regional Lymph Nodes:       Number of Lymph Nodes Involved: 0                            Nodal Sites with Tumor: 0       Number of Lymph Nodes Examined: 3                       Nodal Sites Examined: 2  Pathologic Stage Classification (pTNM, AJCC 8th Edition): pT3, pN0  Ancillary Studies: Can be performed if requested.   #Right upper lobe stage IIb-pT3 [not cisplatin candidate]; CarboTaxol x4. April 11th, 2023-Atezo q 3 w x1 year  # AUG 2023- IATROGENIC HYPOTHYRODISM- TSH 100; START SYNTHROID   Malignant neoplasm of upper lobe of right lung ([place])  04/24/2021 Initial Diagnosis   Malignant neoplasm of  upper lobe of right lung ([place])   05/02/2021 Cancer Staging   Staging form: Lung, AJCC 8th Edition - Clinical: Stage IB (cT2a, cN0, cM0) - Signed by Cammie Sickle, MD on 05/02/2021 Stage prefix: Initial diagnosis   07/23/2021 Cancer Staging   Staging form: Lung, AJCC 8th Edition - Pathologic: Stage IIB (pT3, pN0, cM0) - Signed by Cammie Sickle, MD on 07/23/2021 Stage prefix: Initial diagnosis    HISTORY OF PRESENTING ILLNESS:  Patient accompanied with her  daughter today.  in a wheel chair.   Olivia Buchanan 72 y.o.  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  Right shoulder/right wrist pain and does not want work up. Still not taking Metformin due to uncontrollable diarrhea when taking.  Patient is currently not on any diabetic medications.   Patient continues to have chronic joint pains.  Intermittently patient does get nauseated that is controlled with Compazine.   Chronic mild shortness of breath chronic cough not any worse.  Chronic mild fatigue. No worsening tingling or numbness.  Chronic back pain.   No chest pain.  Headaches improved.    Review of Systems  Constitutional:  Positive for malaise/fatigue and weight loss. Negative for chills, diaphoresis and fever.  HENT:  Negative for nosebleeds and sore throat.   Eyes:  Negative for double vision.  Respiratory:  Positive for cough and shortness of breath. Negative for hemoptysis and wheezing.   Cardiovascular:  Negative for chest pain, palpitations, orthopnea and leg swelling.  Gastrointestinal:  Negative for abdominal pain, blood in stool, constipation, diarrhea, heartburn, melena and vomiting.  Genitourinary:  Negative for dysuria, frequency and urgency.  Musculoskeletal:  Positive for back pain, joint pain and neck pain.  Skin: Negative.  Negative for itching and rash.  Neurological:  Negative for tingling, focal weakness and weakness.  Endo/Heme/Allergies:  Does not bruise/bleed easily.  Psychiatric/Behavioral:  Negative for depression. The patient is not nervous/anxious and does not have insomnia.      MEDICAL HISTORY:  Past Medical History:  Diagnosis Date   Angioedema 12/08/2019   Anxiety    Aortic atherosclerosis (HCC)    Asthma    CAD (coronary artery disease)    3 vessel   Cancer (HCC)    COPD (chronic obstructive pulmonary disease) ([place]) 12/08/2019   DDD (degenerative disc disease), lumbar    Depression    Diabetes  mellitus without complication (HCC)    no meds, diet controlled   Dyspnea    GERD (gastroesophageal reflux disease)    HTN (hypertension) 12/08/2019   Hyperlipidemia    Hypertension    Mass of upper lobe of right lung 04/02/2021   spiculated mass RIGHT posterior pulmonary apex with associated hilar and pretracheal LAD; measured 3.2 x 2.8 cm   Obesity (BMI 30-39.9)    T2DM (type 2 diabetes mellitus) ([place]) 12/08/2019    SURGICAL HISTORY: Past Surgical History:  Procedure Laterality Date   BACK SURGERY     lumbar L5-S1 ruptured disc x 3 surgeries   CERVICAL FUSION  2009   FINE NEEDLE ASPIRATION  06/23/2021   Procedure: FINE NEEDLE ASPIRATION (FNA) LINEAR;  Surgeon: Garner Nash, DO;  Location: [facility];  Service: Pulmonary;;   INTERCOSTAL NERVE BLOCK Right 07/09/2021   Procedure: INTERCOSTAL NERVE BLOCK;  Surgeon: Lajuana Matte, MD;  Location: [facility];  Service: Thoracic;  Laterality: Right;   IR IMAGING GUIDED PORT INSERTION  08/06/2021   LAPAROSCOPIC TOTAL HYSTERECTOMY  1998  with oophorectomy   LYMPH NODE DISSECTION Right 07/09/2021   Procedure: LYMPH NODE DISSECTION;  Surgeon: Lajuana Matte, MD;  Location: [facility];  Service: Thoracic;  Laterality: Right;   TONSILLECTOMY     VIDEO BRONCHOSCOPY WITH ENDOBRONCHIAL NAVIGATION N/A 04/15/2021   Procedure: VIDEO BRONCHOSCOPY WITH ENDOBRONCHIAL NAVIGATION;  Surgeon: Ottie Glazier, MD;  Location: [facility];  Service: Thoracic;  Laterality: N/A;   VIDEO BRONCHOSCOPY WITH ENDOBRONCHIAL ULTRASOUND N/A 04/15/2021   Procedure: VIDEO BRONCHOSCOPY WITH ENDOBRONCHIAL ULTRASOUND;  Surgeon: Ottie Glazier, MD;  Location: [facility];  Service: Thoracic;  Laterality: N/A;   VIDEO BRONCHOSCOPY WITH ENDOBRONCHIAL ULTRASOUND N/A 06/23/2021   Procedure: VIDEO BRONCHOSCOPY WITH ENDOBRONCHIAL ULTRASOUND;  Surgeon: Garner Nash, DO;  Location: [facility];  Service: Pulmonary;  Laterality: N/A;    SOCIAL HISTORY: Social History    Socioeconomic History   Marital status: Married    Spouse name: Dwight   Number of children: 3   Years of education: Not on file   Highest education level: Not on file  Occupational History   Not on file  Tobacco Use   Smoking status: Former    Packs/day: 3.00    Years: 50.00    Total pack years: 150.00    Types: Cigarettes    Quit date: 2019    Years since quitting: 5.0   Smokeless tobacco: Never  Vaping Use   Vaping Use: Never used  Substance and Sexual Activity   Alcohol use: Not Currently   Drug use: Never   Sexual activity: Not on file    Comment: Hysterectomy  Other Topics Concern   Not on file  Social History Narrative   Lives in home; with husband; lives in [place]; never smoking 2019; no alcohol. 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    Social Determinants of Health   Financial Resource Strain: Not on file  Food Insecurity: Not on file  Transportation Needs: Not on file  Physical Activity: Not on file  Stress: Not on file  Social Connections: Not on file  Intimate Partner Violence: Not on file    FAMILY HISTORY: Family History  Problem Relation Age of Onset   Bone cancer Maternal Uncle     ALLERGIES:  is allergic to ibuprofen and macrobid [nitrofurantoin].  MEDICATIONS:  Current Outpatient Medications  Medication Sig Dispense Refill   albuterol (VENTOLIN HFA) 108 (90 Base) MCG/ACT inhaler Inhale 1 puff into the lungs every 6 (six) hours as needed for wheezing.     aspirin EC 81 MG tablet Take 81 mg by mouth at bedtime.     Biotin 1000 MCG tablet Take 1,000 mcg by mouth 3 (three) times daily.     clonazePAM (KLONOPIN) 0.5 MG tablet Take 0.5 mg by mouth in the morning, at noon, and at bedtime.     diphenoxylate-atropine (LOMOTIL) 2.5-0.025 MG tablet Take by mouth.     EPINEPHrine 0.3 mg/0.3 mL IJ SOAJ injection Inject 0.3 mLs (0.3 mg total) into the muscle as needed for anaphylaxis. 2 each 0   ergocalciferol (VITAMIN D2)  1.25 MG (50000 UT) capsule Take 1 capsule (50,000 Units total) by mouth once a week. 12 capsule 1   fluticasone-salmeterol (ADVAIR) 250-50 MCG/ACT AEPB Inhale 1 puff into the lungs in the morning and at bedtime.     gabapentin (NEURONTIN) 300 MG capsule Take 300 mg by mouth 3 (three) times daily. Take 300 mg qam and lunch and take 600 mg qhs     hydrochlorothiazide (HYDRODIURIL) 25 MG  tablet Take 25 mg by mouth every other day.     HYDROcodone-acetaminophen (NORCO) 10-325 MG tablet Take 1 tablet by mouth every 6 (six) hours as needed.     ipratropium-albuterol (DUONEB) 0.5-2.5 (3) MG/3ML SOLN Take 3 mLs by nebulization 3 (three) times daily.     levothyroxine (SYNTHROID) 125 MCG tablet TAKE 1 TABLET(125 MCG) BY MOUTH DAILY BEFORE BREAKFAST 90 tablet 1   lidocaine-prilocaine (EMLA) cream Apply topically once.     Melatonin 10 MG TABS Take 10 mg by mouth at bedtime.     Multiple Vitamin (MULTI-VITAMIN) tablet Take 1 tablet by mouth at bedtime.     nystatin cream (MYCOSTATIN) Apply 1 application topically 2 (two) times daily as needed for dry skin (around lips).     pantoprazole (PROTONIX) 40 MG tablet Take 40 mg by mouth at bedtime.     potassium chloride SA (KLOR-CON) 20 MEQ tablet Take 20 mEq by mouth 2 (two) times daily.     pravastatin (PRAVACHOL) 80 MG tablet Take 80 mg by mouth at bedtime.     prochlorperazine (COMPAZINE) 10 MG tablet Take 1 tablet (10 mg total) by mouth every 6 (six) hours as needed (Nausea or vomiting). 30 tablet 1   tiZANidine (ZANAFLEX) 4 MG tablet Take 4 mg by mouth 3 (three) times daily.     traZODone (DESYREL) 100 MG tablet Take 50 mg by mouth at bedtime.     venlafaxine XR (EFFEXOR-XR) 75 MG 24 hr capsule Take 150 mg by mouth at bedtime.     docusate sodium (COLACE) 100 MG capsule Take 200 mg by mouth at bedtime. (Patient not taking: Reported on 10/15/2022)     metFORMIN (GLUCOPHAGE) 500 MG tablet TAKE 1 TABLET(500 MG) BY MOUTH TWICE DAILY WITH A MEAL (Patient not  taking: Reported on 10/15/2022) 60 tablet 3   No current facility-administered medications for this visit.   Facility-Administered Medications Ordered in Other Visits  Medication Dose Route Frequency Provider Last Rate Last Admin   heparin lock flush 100 UNIT/ML injection            sodium chloride flush (NS) 0.9 % injection 10 mL  10 mL Intravenous Once Charlaine Dalton R, MD          .  PHYSICAL EXAMINATION: ECOG PERFORMANCE STATUS: 1 - Symptomatic but completely ambulatory  Vitals:   11/05/22 1000  BP: 110/70  Pulse: 79  Resp: 18  Temp: (!) 97.1 F (36.2 C)  SpO2: 96%   Filed Weights   11/05/22 1000  Weight: 183 lb (83 kg)    Physical Exam Vitals and nursing note reviewed.  HENT:     Head: Normocephalic and atraumatic.     Mouth/Throat:     Mouth: Mucous membranes are moist.     Pharynx: Oropharynx is clear. No oropharyngeal exudate.  Eyes:     Extraocular Movements: Extraocular movements intact.     Pupils: Pupils are equal, round, and reactive to light.  Cardiovascular:     Rate and Rhythm: Normal rate and regular rhythm.  Pulmonary:     Effort: No respiratory distress.     Breath sounds: No wheezing.     Comments: Decreased breath sounds bilaterally at bases.  No wheeze or crackles Abdominal:     General: Bowel sounds are normal. There is no distension.     Palpations: Abdomen is soft. There is no mass.     Tenderness: There is no abdominal tenderness. There is no guarding or rebound.  Musculoskeletal:  General: No tenderness. Normal range of motion.     Cervical back: Normal range of motion and neck supple.  Skin:    General: Skin is warm.  Neurological:     General: No focal deficit present.     Mental Status: She is alert and oriented to person, place, and time.     Comments: 4-5 strength in the left lower extremity/chronic.  Psychiatric:        Mood and Affect: Affect normal.        Behavior: Behavior normal.        Judgment: Judgment  normal.      LABORATORY DATA:  I have reviewed the data as listed Lab Results  Component Value Date   WBC 6.3 11/05/2022   HGB 12.6 11/05/2022   HCT 36.1 11/05/2022   MCV 87.0 11/05/2022   PLT 148 (L) 11/05/2022   Recent Labs    09/03/22 0837 10/15/22 1231 11/05/22 1009  NA 136 139 139  K 3.3* 3.5 3.4*  CL 99 102 102  CO2 26 28 28   GLUCOSE 248* 129* 171*  BUN 10 11 9   CREATININE 0.63 0.54 0.64  CALCIUM 8.6* 8.7* 8.9  GFRNONAA >60 >60 >60  PROT 7.3 6.8 7.5  ALBUMIN 3.1* 3.6 3.9  AST 28 28 30   ALT 23 15 18   ALKPHOS 60 53 64  BILITOT 0.4 0.3 0.2*    RADIOGRAPHIC STUDIES: I have personally reviewed the radiological images as listed and agreed with the findings in the report. No results found.  ASSESSMENT & PLAN:   Malignant neoplasm of upper lobe of right lung ([place]) # pT3N0 [4.5cm; Squamous cell ca- + involvement of the parietal pleura]; stage II. Currently s/p adjuvant carbo-taxol x4 [poor candidate for cisplatin].  PD-L1-5%; Currently on adjuvant Atezolizumab. CT JAN 2024-  Stable exam. No new or progressive findings to suggest recurrent or metastatic disease;  The mildly enlarged precarinal lymph node seen is overall stable.  Multiple interruptions given patient's overall general co-morbidities-unrelated to treatment. stable  # proceed  Adjuvant Atezoluzimab # 11 of planned 13  Labs today reviewed;  acceptable for treatment today. Labs today reviewed.  Patient tolerating treatment with mild to moderate difficulties-see below hypo-hyperthyroidism.     # Right shoulder/wrist pain- ? Etiology- recommend X-rays. Pt wants to wait.   # Hypokalemia: Off hydrochlorothiazide - On Kdur BID- 3.5- stable.   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 continue-vit D 50,000-stable.   # chronic back pain/chronic left LE/Bil PN-history of cervical spine injury-no evidence of metastatic disease.  Continue  hydrocodone [for now as per Dr. Doy Hutching. stable.   # Iatrogenic hypo-hyperthyroidism- [AUG 2023]-TSH-100 mcg/day-; JAN 2024-TSH- LOW DEC 2023- synthoird- 100 mg/day; monitor for now. stable. Will monitor for me.   # History of diabetes-OFF metformin [sec to diarrhea] [facility] 2023-Hb A1c-Hb A1C-5.9]. BF BG-171- ?defer to PCP.   # DISPOSITION:  # proceed with Atezoluzumab today.  # Follow up in 3 weeks MD; port/ labs- cbc/cmp; thyroid profile- Atzeoluzimab; - Dr.B   All questions were answered. The patient knows to call the clinic with any problems, questions or concerns.    Cammie Sickle, MD 11/05/2022 11:09 AM

## 2022-11-05 NOTE — Progress Notes (Signed)
Right wrist pain and does not want work up.    Still not taking Metformin due to uncontrollable diarrhea when taking.

## 2022-11-05 NOTE — Assessment & Plan Note (Addendum)
#   pT3N0 [4.5cm; Squamous cell ca- + involvement of the parietal pleura]; stage II. Currently s/p adjuvant carbo-taxol x4 [poor candidate for cisplatin].  PD-L1-5%; Currently on adjuvant Atezolizumab. CT JAN 2024-  Stable exam. No new or progressive findings to suggest recurrent or metastatic disease;  The mildly enlarged precarinal lymph node seen is overall stable.  Multiple interruptions given patient's overall general co-morbidities-unrelated to treatment. stable  # proceed  Adjuvant Atezoluzimab # 12 of planned 13  Labs today reviewed;  acceptable for treatment today. Labs today reviewed.  Patient tolerating treatment with mild to moderate difficulties-see below hypo-hyperthyroidism.     # Right shoulder/wrist pain- ? Etiology- recommend X-rays. Pt wants to wait.   # Hypokalemia: Off hydrochlorothiazide - On Kdur BID- 3.4- stable.   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 continue-vit D 50,000-stable.   # chronic back pain/chronic left LE/Bil PN-history of cervical spine injury-no evidence of metastatic disease.  Continue hydrocodone [for now as per Dr. Doy Hutching. stable.   # Iatrogenic hypo-hyperthyroidism- [AUG 2023]-TSH-100 mcg/day-; JAN 2024-TSH- LOW DEC 2023- synthoird- 100 mg/day; monitor for now. stable. Will monitor for now.  TSH slowly improving.   # History of diabetes-OFF metformin [sec to diarrhea] [facility] 2023-Hb A1c-Hb A1C-5.9]. BF BG-171- ?defer to PCP.   # DISPOSITION:  # proceed with Atezoluzumab today.  # Follow up in 3 weeks MD; port/ labs- cbc/cmp; thyroid profile- Atzeoluzimab; - Dr.B

## 2022-11-06 LAB — THYROID PANEL WITH TSH
Free Thyroxine Index: 3 (ref 1.2–4.9)
T3 Uptake Ratio: 29 % (ref 24–39)
T4, Total: 10.4 ug/dL (ref 4.5–12.0)
TSH: 0.033 u[IU]/mL — ABNORMAL LOW (ref 0.450–4.500)

## 2022-11-07 ENCOUNTER — Other Ambulatory Visit: Payer: Self-pay

## 2022-11-16 ENCOUNTER — Other Ambulatory Visit: Payer: Self-pay

## 2022-11-19 ENCOUNTER — Other Ambulatory Visit: Payer: Self-pay

## 2022-11-26 ENCOUNTER — Inpatient Hospital Stay: Payer: Medicare HMO

## 2022-11-26 ENCOUNTER — Inpatient Hospital Stay: Payer: Medicare HMO | Admitting: Internal Medicine

## 2022-11-26 VITALS — BP 105/70 | HR 80 | Temp 97.8°F | Resp 18 | Ht 59.0 in | Wt 181.4 lb

## 2022-11-26 DIAGNOSIS — Z5112 Encounter for antineoplastic immunotherapy: Secondary | ICD-10-CM | POA: Diagnosis not present

## 2022-11-26 DIAGNOSIS — G8929 Other chronic pain: Secondary | ICD-10-CM | POA: Diagnosis not present

## 2022-11-26 DIAGNOSIS — G620 Drug-induced polyneuropathy: Secondary | ICD-10-CM | POA: Diagnosis not present

## 2022-11-26 DIAGNOSIS — M549 Dorsalgia, unspecified: Secondary | ICD-10-CM | POA: Diagnosis not present

## 2022-11-26 DIAGNOSIS — C3411 Malignant neoplasm of upper lobe, right bronchus or lung: Secondary | ICD-10-CM | POA: Diagnosis not present

## 2022-11-26 DIAGNOSIS — E119 Type 2 diabetes mellitus without complications: Secondary | ICD-10-CM | POA: Diagnosis not present

## 2022-11-26 DIAGNOSIS — D491 Neoplasm of unspecified behavior of respiratory system: Secondary | ICD-10-CM

## 2022-11-26 DIAGNOSIS — E876 Hypokalemia: Secondary | ICD-10-CM | POA: Diagnosis not present

## 2022-11-26 DIAGNOSIS — J4489 Other specified chronic obstructive pulmonary disease: Secondary | ICD-10-CM | POA: Diagnosis not present

## 2022-11-26 DIAGNOSIS — E032 Hypothyroidism due to medicaments and other exogenous substances: Secondary | ICD-10-CM | POA: Diagnosis not present

## 2022-11-26 LAB — CBC WITH DIFFERENTIAL/PLATELET
Abs Immature Granulocytes: 0.02 10*3/uL (ref 0.00–0.07)
Basophils Absolute: 0.1 10*3/uL (ref 0.0–0.1)
Basophils Relative: 1 %
Eosinophils Absolute: 0.9 10*3/uL — ABNORMAL HIGH (ref 0.0–0.5)
Eosinophils Relative: 11 %
HCT: 38 % (ref 36.0–46.0)
Hemoglobin: 13.1 g/dL (ref 12.0–15.0)
Immature Granulocytes: 0 %
Lymphocytes Relative: 41 %
Lymphs Abs: 3.4 10*3/uL (ref 0.7–4.0)
MCH: 30.5 pg (ref 26.0–34.0)
MCHC: 34.5 g/dL (ref 30.0–36.0)
MCV: 88.6 fL (ref 80.0–100.0)
Monocytes Absolute: 0.7 10*3/uL (ref 0.1–1.0)
Monocytes Relative: 8 %
Neutro Abs: 3.3 10*3/uL (ref 1.7–7.7)
Neutrophils Relative %: 39 %
Platelets: 153 10*3/uL (ref 150–400)
RBC: 4.29 MIL/uL (ref 3.87–5.11)
RDW: 13 % (ref 11.5–15.5)
WBC: 8.4 10*3/uL (ref 4.0–10.5)
nRBC: 0 % (ref 0.0–0.2)

## 2022-11-26 LAB — COMPREHENSIVE METABOLIC PANEL
ALT: 20 U/L (ref 0–44)
AST: 31 U/L (ref 15–41)
Albumin: 3.9 g/dL (ref 3.5–5.0)
Alkaline Phosphatase: 65 U/L (ref 38–126)
Anion gap: 12 (ref 5–15)
BUN: 12 mg/dL (ref 8–23)
CO2: 25 mmol/L (ref 22–32)
Calcium: 9 mg/dL (ref 8.9–10.3)
Chloride: 100 mmol/L (ref 98–111)
Creatinine, Ser: 0.75 mg/dL (ref 0.44–1.00)
GFR, Estimated: >60 mL/min (ref >60)
Glucose, Bld: 180 mg/dL — ABNORMAL HIGH (ref 70–99)
Potassium: 3.5 mmol/L (ref 3.5–5.1)
Sodium: 137 mmol/L (ref 135–145)
Total Bilirubin: 0.4 mg/dL (ref 0.3–1.2)
Total Protein: 7.4 g/dL (ref 6.5–8.1)

## 2022-11-26 LAB — TSH: TSH: 0.048 u[IU]/mL — ABNORMAL LOW (ref 0.350–4.500)

## 2022-11-26 MED ORDER — SODIUM CHLORIDE 0.9 % IV SOLN
Freq: Once | INTRAVENOUS | Status: AC
  Filled 2022-11-26: qty 250

## 2022-11-26 MED ORDER — SODIUM CHLORIDE 0.9 % IV SOLN
1200.0000 mg | Freq: Once | INTRAVENOUS | Status: AC
  Administered 2022-11-26: 1200 mg via INTRAVENOUS
  Filled 2022-11-26: qty 20

## 2022-11-26 NOTE — Patient Instructions (Signed)
[facility]  Discharge Instructions: Thank you for choosing [facility] to provide your oncology and hematology care.  If you have a lab appointment with the [facility], please go directly to the [facility] and check in at the registration area.  Wear comfortable clothing and clothing appropriate for easy access to any Portacath or PICC line.   We strive to give you quality time with your provider. You may need to reschedule your appointment if you arrive late (15 or more minutes).  Arriving late affects you and other patients whose appointments are after yours.  Also, if you miss three or more appointments without notifying the office, you may be dismissed from the clinic at the provider's discretion.      For prescription refill requests, have your pharmacy contact our office and allow 72 hours for refills to be completed.    Today you received the following chemotherapy and/or immunotherapy agents Tecentriq      To help prevent nausea and vomiting after your treatment, we encourage you to take your nausea medication as directed.  BELOW ARE SYMPTOMS THAT SHOULD BE REPORTED IMMEDIATELY: *FEVER GREATER THAN 100.4 F (38 C) OR HIGHER *CHILLS OR SWEATING *NAUSEA AND VOMITING THAT IS NOT CONTROLLED WITH YOUR NAUSEA MEDICATION *UNUSUAL SHORTNESS OF BREATH *UNUSUAL BRUISING OR BLEEDING *URINARY PROBLEMS (pain or burning when urinating, or frequent urination) *BOWEL PROBLEMS (unusual diarrhea, constipation, pain near the anus) TENDERNESS IN MOUTH AND THROAT WITH OR WITHOUT PRESENCE OF ULCERS (sore throat, sores in mouth, or a toothache) UNUSUAL RASH, SWELLING OR PAIN  UNUSUAL VAGINAL DISCHARGE OR ITCHING   Items with * indicate a potential emergency and should be followed up as soon as possible or go to the Emergency Department if any problems should occur.  Please show the CHEMOTHERAPY ALERT CARD or IMMUNOTHERAPY ALERT CARD at check-in to  the Emergency Department and triage nurse.  Should you have questions after your visit or need to cancel or reschedule your appointment, please contact [facility]  [phone] and follow the prompts.  Office hours are 8:00 a.m. to 4:30 p.m. Monday - Friday. Please note that voicemails left after 4:00 p.m. may not be returned until the following business day.  We are closed weekends and major holidays. You have access to a nurse at all times for urgent questions. Please call the main number to the clinic [phone] and follow the prompts.  For any non-urgent questions, you may also contact your provider using MyChart. We now offer e-Visits for anyone 48 and older to request care online for non-urgent symptoms. For details visit [email or web address].   Also download the MyChart app! Go to the app store, search "MyChart", open the app, select [facility], and log in with your MyChart username and password.

## 2022-11-26 NOTE — Assessment & Plan Note (Addendum)
#   pT3N0 [4.5cm; Squamous cell ca- + involvement of the parietal pleura]; stage II. Currently s/p adjuvant carbo-taxol x4 [poor candidate for cisplatin].  PD-L1-5%; Currently on adjuvant Atezolizumab. CT 2nd-  JAN 2024-  Stable exam. No new or progressive findings to suggest recurrent or metastatic disease;  The mildly enlarged precarinal lymph node seen is overall stable.  Multiple interruptions given patient's overall general co-morbidities-unrelated to treatment. stable  # proceed  Adjuvant Atezoluzimab # 13 of planned 13  Labs today reviewed;  acceptable for treatment today. Labs today reviewed.  Patient tolerating treatment with mild to moderate difficulties-see below hypo-hyperthyroidism.     # Right shoulder/wrist pain- ? Etiology- [facility]- recommend voltaren gel.   # Hypokalemia: Off hydrochlorothiazide - On Kdur BID- 3.5 stable.   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 continue-vit D 50,000-stable.   # chronic back pain/chronic left LE/Bil PN-history of cervical spine injury-no evidence of metastatic disease.  Continue hydrocodone [for now as per Dr. Doy Hutching. stable.   # Iatrogenic hypo-hyperthyroidism- [AUG 2023]-TSH-100 mcg/day-; JAN 2024-TSH- LOW DEC 2023- synthoird- 100 mg/day; monitor for now. stable. Will monitor for now.  TSH slowly improving. Stable.   # History of diabetes-OFF metformin [sec to diarrhea] [facility] 2023-Hb A1c-Hb A1C-5.9]. BF BG-164- stable.  ?defer to PCP.   # DISPOSITION:  # proceed with Atezoluzumab today.  # Follow up in 1st week of April- MD; labs- cbc/cmp;port flush; thyroid profile- CT prior- Dr.B

## 2022-11-26 NOTE — Progress Notes (Signed)
Worsening right wrist pain with movement.  While wrist is resting there is no pain.  Decreasing appetite with 2 lb wt loss.  Feeling nauseas today.

## 2022-11-26 NOTE — Progress Notes (Signed)
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 Spiculated mass of the posterior right pulmonary apex measuring 3.2 x 2.8 cm, consistent with primary lung malignancy. 2. Enlarged right hilar and pretracheal lymph nodes measuring up to 1.6 x 1.5 cm, suspicious for nodal metastatic disease. 3. Recommend multidisciplinary thoracic referral for consideration of PET-CT metabolic characterization and tissue sampling. 4. Background of very fine centrilobular nodularity throughout the lungs, most concentrated at the apices. Scattered bronchiolar plugging. Findings are most consistent with smoking-related respiratory bronchiolitis. 5. Coarse, nodular contour of the liver in the included upper abdomen, suggestive of cirrhosis. 6. Coronary artery disease.  # COPD-  # DM- diet controlled; chronic back pain- hydrocodone 5 day; chronic left lower extremity weakness.  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 Currently on Synthroid 150 mcg/day-  # NGS/MOLECULAR TESTS:    # PALLIATIVE CARE EVALUATION:  # PAIN MANAGEMENT:    DIAGNOSIS:   STAGE:         ;  GOALS:  CURRENT/MOST RECENT THERAPY :    A. LYMPH NODE, HILAR, EXCISION:  - Lymph node negative for metastatic carcinoma.   B. LYMPH NODE, LEVEL 4, EXCISION:  - Lymph node negative for metastatic carcinoma.   C. LUNG, RIGHT UPPER LOBE, LOBECTOMY:  - Squamous cell carcinoma, 4.5 cm.  - Carcinoma extends through visceral pleura and focally involves  parietal pleura.  -  Surgical margins negative for carcinoma.  - One lymph node negative for carcinoma.  - See oncology table and comment.   D. SOFT TISSUE, CHEST WALL, BIOPSY:  - Fibroadipose tissue with focal inflammation and fibrosis.  - No carcinoma identified.   ONCOLOGY TABLE:  LUNG: Resection  Synchronous Tumors: Not applicable.  Total Number of Primary Tumors: 1  Procedure: Right upper lobectomy with lymph node biopsies and chest wall  biopsy.  Specimen Laterality: Right upper lobe.  Tumor Focality: Unifocal.  Tumor Site: Right upper lobe.  Tumor Size: 4.5 x 4 x 3 cm.  Histologic Type: Squamous cell carcinoma.  Visceral Pleura Invasion: Present.  Direct Invasion of Adjacent Structures: Focal involvement of parietal  pleura.  Lymphovascular Invasion: Not identified.  Margins: All surgical margins negative for carcinoma.  Regional Lymph Nodes:       Number of Lymph Nodes Involved: 0                            Nodal Sites with Tumor: 0       Number of Lymph Nodes Examined: 3                       Nodal Sites Examined: 2  Pathologic Stage Classification (pTNM, AJCC 8th Edition): pT3, pN0  Ancillary Studies: Can be performed if requested.   #Right upper lobe stage IIb-pT3 [not cisplatin candidate]; CarboTaxol x4. April 11th, 2023-Atezo q 3 w x1 year  # AUG 2023- IATROGENIC HYPOTHYRODISM- TSH 100; START SYNTHROID   Malignant neoplasm of upper lobe of right lung ([place])  04/24/2021 Initial Diagnosis   Malignant neoplasm of  upper lobe of right lung ([place])   05/02/2021 Cancer Staging   Staging form: Lung, AJCC 8th Edition - Clinical: Stage IB (cT2a, cN0, cM0) - Signed by Cammie Sickle, MD on 05/02/2021 Stage prefix: Initial diagnosis   07/23/2021 Cancer Staging   Staging form: Lung, AJCC 8th Edition - Pathologic: Stage IIB (pT3, pN0, cM0) - Signed by Cammie Sickle, MD on 07/23/2021 Stage prefix: Initial diagnosis    HISTORY OF PRESENTING ILLNESS:  Patient accompanied with her  daughter today.  in a wheel chair.   Deicy D Beam [name] 72 y.o.  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  Worsening right wrist pain with movement.  While wrist is resting there is no pain.   Decreasing appetite with 2 lb wt loss. Feeling nauseas today.   Chronic mild shortness of breath chronic cough not any worse.  Chronic mild fatigue. No worsening tingling or numbness.  Chronic back pain.   No chest pain.  Headaches improved.    Review of Systems  Constitutional:  Positive for malaise/fatigue and weight loss. Negative for chills, diaphoresis and fever.  HENT:  Negative for nosebleeds and sore throat.   Eyes:  Negative for double vision.  Respiratory:  Positive for cough and shortness of breath. Negative for hemoptysis and wheezing.   Cardiovascular:  Negative for chest pain, palpitations, orthopnea and leg swelling.  Gastrointestinal:  Negative for abdominal pain, blood in stool, constipation, diarrhea, heartburn, melena and vomiting.  Genitourinary:  Negative for dysuria, frequency and urgency.  Musculoskeletal:  Positive for back pain, joint pain and neck pain.  Skin: Negative.  Negative for itching and rash.  Neurological:  Negative for tingling, focal weakness and weakness.  Endo/Heme/Allergies:  Does not bruise/bleed easily.  Psychiatric/Behavioral:  Negative for depression. The patient is not nervous/anxious and does not have insomnia.      MEDICAL HISTORY:  Past Medical History:  Diagnosis Date   Angioedema 12/08/2019   Anxiety    Aortic atherosclerosis (HCC)    Asthma    CAD (coronary artery disease)    3 vessel   Cancer (HCC)    COPD (chronic obstructive pulmonary disease) ([place]) 12/08/2019   DDD (degenerative disc disease), lumbar    Depression    Diabetes mellitus without complication (HCC)    no meds, diet controlled   Dyspnea    GERD (gastroesophageal reflux disease)    HTN (hypertension) 12/08/2019    Hyperlipidemia    Hypertension    Mass of upper lobe of right lung 04/02/2021   spiculated mass RIGHT posterior pulmonary apex with associated hilar and pretracheal LAD; measured 3.2 x 2.8 cm   Obesity (BMI 30-39.9)    T2DM (type 2 diabetes mellitus) ([place]) 12/08/2019    SURGICAL HISTORY: Past Surgical History:  Procedure Laterality Date   BACK SURGERY     lumbar L5-S1 ruptured disc x 3 surgeries   CERVICAL FUSION  2009   FINE NEEDLE ASPIRATION  06/23/2021   Procedure: FINE NEEDLE ASPIRATION (FNA) LINEAR;  Surgeon: Garner Nash, DO;  Location: [facility];  Service: Pulmonary;;   INTERCOSTAL NERVE BLOCK Right 07/09/2021   Procedure: INTERCOSTAL NERVE BLOCK;  Surgeon: Lajuana Matte, MD;  Location: [facility];  Service: Thoracic;  Laterality: Right;   IR IMAGING GUIDED PORT INSERTION  08/06/2021   LAPAROSCOPIC TOTAL HYSTERECTOMY  1998   with oophorectomy   LYMPH NODE DISSECTION Right 07/09/2021   Procedure: LYMPH NODE DISSECTION;  Surgeon: Lajuana Matte, MD;  Location: [facility];  Service: Thoracic;  Laterality: Right;   TONSILLECTOMY     VIDEO BRONCHOSCOPY WITH ENDOBRONCHIAL NAVIGATION N/A 04/15/2021   Procedure: VIDEO BRONCHOSCOPY WITH ENDOBRONCHIAL NAVIGATION;  Surgeon: Ottie Glazier, MD;  Location: [facility];  Service: Thoracic;  Laterality: N/A;   VIDEO BRONCHOSCOPY WITH ENDOBRONCHIAL ULTRASOUND N/A 04/15/2021   Procedure: VIDEO BRONCHOSCOPY WITH ENDOBRONCHIAL ULTRASOUND;  Surgeon: Ottie Glazier, MD;  Location: [facility];  Service: Thoracic;  Laterality: N/A;   VIDEO BRONCHOSCOPY WITH ENDOBRONCHIAL ULTRASOUND N/A 06/23/2021   Procedure: VIDEO BRONCHOSCOPY WITH ENDOBRONCHIAL ULTRASOUND;  Surgeon: Garner Nash, DO;  Location: [facility];  Service: Pulmonary;  Laterality: N/A;    SOCIAL HISTORY: Social History   Socioeconomic History   Marital status: Married    Spouse name: Dwight   Number of children: 3   Years of education: Not on file   Highest education level:  Not on file  Occupational History   Not on file  Tobacco Use   Smoking status: Former    Packs/day: 3.00    Years: 50.00    Total pack years: 150.00    Types: Cigarettes    Quit date: 2019    Years since quitting: 5.1   Smokeless tobacco: Never  Vaping Use   Vaping Use: Never used  Substance and Sexual Activity   Alcohol use: Not Currently   Drug use: Never   Sexual activity: Not on file    Comment: Hysterectomy  Other Topics Concern   Not on file  Social History Narrative   Lives in home; with husband; lives in [place]; never smoking 2019; no alcohol. 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    Social Determinants of Health   Financial Resource Strain: Not on file  Food Insecurity: Not on file  Transportation Needs: Not on file  Physical Activity: Not on file  Stress: Not on file  Social Connections: Not on file  Intimate Partner Violence: Not on file    FAMILY HISTORY: Family History  Problem Relation Age of Onset   Bone cancer Maternal Uncle     ALLERGIES:  is allergic to ibuprofen and macrobid [nitrofurantoin].  MEDICATIONS:  Current Outpatient Medications  Medication Sig Dispense Refill   albuterol (VENTOLIN HFA) 108 (90 Base) MCG/ACT inhaler Inhale 1 puff into the lungs every 6 (six) hours as needed for wheezing.     aspirin EC 81 MG tablet Take 81 mg by mouth at bedtime.     Biotin 1000 MCG tablet Take 1,000 mcg by mouth 3 (three) times daily.     clonazePAM (KLONOPIN) 0.5 MG tablet Take 0.5 mg by mouth in the morning, at noon, and at bedtime.     diphenoxylate-atropine (LOMOTIL) 2.5-0.025 MG tablet Take by mouth.     EPINEPHrine 0.3 mg/0.3 mL IJ SOAJ injection Inject 0.3 mLs (0.3 mg total) into the muscle as needed for anaphylaxis. 2 each 0   fluticasone-salmeterol (ADVAIR) 250-50 MCG/ACT AEPB Inhale 1 puff into the lungs in the morning and at bedtime.     gabapentin (NEURONTIN) 300 MG capsule Take 300 mg by mouth 3 (three) times  daily. Take 300 mg qam and lunch and take 600 mg qhs     hydrochlorothiazide (HYDRODIURIL) 25 MG tablet Take 25 mg by mouth every other day.     HYDROcodone-acetaminophen (NORCO) 10-325 MG tablet Take 1 tablet by mouth every 6 (six) hours as needed.     ipratropium-albuterol (DUONEB) 0.5-2.5 (3) MG/3ML SOLN Take 3 mLs by nebulization 3 (three)  times daily.     levothyroxine (SYNTHROID) 125 MCG tablet TAKE 1 TABLET(125 MCG) BY MOUTH DAILY BEFORE BREAKFAST 90 tablet 1   lidocaine-prilocaine (EMLA) cream Apply topically once.     Melatonin 10 MG TABS Take 10 mg by mouth at bedtime.     Multiple Vitamin (MULTI-VITAMIN) tablet Take 1 tablet by mouth at bedtime.     nystatin cream (MYCOSTATIN) Apply 1 application topically 2 (two) times daily as needed for dry skin (around lips).     pantoprazole (PROTONIX) 40 MG tablet Take 40 mg by mouth at bedtime.     potassium chloride SA (KLOR-CON) 20 MEQ tablet Take 20 mEq by mouth 2 (two) times daily.     pravastatin (PRAVACHOL) 80 MG tablet Take 80 mg by mouth at bedtime.     prochlorperazine (COMPAZINE) 10 MG tablet Take 1 tablet (10 mg total) by mouth every 6 (six) hours as needed (Nausea or vomiting). 30 tablet 1   tiZANidine (ZANAFLEX) 4 MG tablet Take 4 mg by mouth 3 (three) times daily.     traZODone (DESYREL) 100 MG tablet Take 50 mg by mouth at bedtime.     venlafaxine XR (EFFEXOR-XR) 75 MG 24 hr capsule Take 150 mg by mouth at bedtime.     Vitamin D, Ergocalciferol, (DRISDOL) 1.25 MG (50000 UNIT) CAPS capsule TAKE 1 CAPSULE BY MOUTH 1 TIME A WEEK 12 capsule 1   docusate sodium (COLACE) 100 MG capsule Take 200 mg by mouth at bedtime. (Patient not taking: Reported on 10/15/2022)     metFORMIN (GLUCOPHAGE) 500 MG tablet TAKE 1 TABLET(500 MG) BY MOUTH TWICE DAILY WITH A MEAL (Patient not taking: Reported on 10/15/2022) 60 tablet 3   No current facility-administered medications for this visit.   Facility-Administered Medications Ordered in Other Visits   Medication Dose Route Frequency Provider Last Rate Last Admin   heparin lock flush 100 UNIT/ML injection            sodium chloride flush (NS) 0.9 % injection 10 mL  10 mL Intravenous Once Charlaine Dalton R, MD          .  PHYSICAL EXAMINATION: ECOG PERFORMANCE STATUS: 1 - Symptomatic but completely ambulatory  Vitals:   11/26/22 0900  BP: 105/70  Pulse: 80  Resp: 18  Temp: 97.8 F (36.6 C)   Filed Weights   11/26/22 0900  Weight: 181 lb 6.4 oz (82.3 kg)    Physical Exam Vitals and nursing note reviewed.  HENT:     Head: Normocephalic and atraumatic.     Mouth/Throat:     Mouth: Mucous membranes are moist.     Pharynx: Oropharynx is clear. No oropharyngeal exudate.  Eyes:     Extraocular Movements: Extraocular movements intact.     Pupils: Pupils are equal, round, and reactive to light.  Cardiovascular:     Rate and Rhythm: Normal rate and regular rhythm.  Pulmonary:     Effort: No respiratory distress.     Breath sounds: No wheezing.     Comments: Decreased breath sounds bilaterally at bases.  No wheeze or crackles Abdominal:     General: Bowel sounds are normal. There is no distension.     Palpations: Abdomen is soft. There is no mass.     Tenderness: There is no abdominal tenderness. There is no guarding or rebound.  Musculoskeletal:        General: No tenderness. Normal range of motion.     Cervical back: Normal range of motion  and neck supple.  Skin:    General: Skin is warm.  Neurological:     General: No focal deficit present.     Mental Status: She is alert and oriented to person, place, and time.     Comments: 4-5 strength in the left lower extremity/chronic.  Psychiatric:        Mood and Affect: Affect normal.        Behavior: Behavior normal.        Judgment: Judgment normal.      LABORATORY DATA:  I have reviewed the data as listed Lab Results  Component Value Date   WBC 8.4 11/26/2022   HGB 13.1 11/26/2022   HCT 38.0 11/26/2022    MCV 88.6 11/26/2022   PLT 153 11/26/2022   Recent Labs    10/15/22 1231 11/05/22 1009 11/26/22 0928  NA 139 139 137  K 3.5 3.4* 3.5  CL 102 102 100  CO2 28 28 25   GLUCOSE 129* 171* 180*  BUN 11 9 12   CREATININE 0.54 0.64 0.75  CALCIUM 8.7* 8.9 9.0  GFRNONAA >60 >60 >60  PROT 6.8 7.5 7.4  ALBUMIN 3.6 3.9 3.9  AST 28 30 31   ALT 15 18 20   ALKPHOS 53 64 65  BILITOT 0.3 0.2* 0.4    RADIOGRAPHIC STUDIES: I have personally reviewed the radiological images as listed and agreed with the findings in the report. No results found.  ASSESSMENT & PLAN:   Malignant neoplasm of upper lobe of right lung ([place]) # pT3N0 [4.5cm; Squamous cell ca- + involvement of the parietal pleura]; stage II. Currently s/p adjuvant carbo-taxol x4 [poor candidate for cisplatin].  PD-L1-5%; Currently on adjuvant Atezolizumab. CT 2nd-  JAN 2024-  Stable exam. No new or progressive findings to suggest recurrent or metastatic disease;  The mildly enlarged precarinal lymph node seen is overall stable.  Multiple interruptions given patient's overall general co-morbidities-unrelated to treatment. stable  # proceed  Adjuvant Atezoluzimab # 13 of planned 13  Labs today reviewed;  acceptable for treatment today. Labs today reviewed.  Patient tolerating treatment with mild to moderate difficulties-see below hypo-hyperthyroidism.     # Right shoulder/wrist pain- ? Etiology- MSK- recommend voltaren gel.   # Hypokalemia: Off hydrochlorothiazide - On Kdur BID- 3.5 stable.   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 continue-vit D 50,000-stable.   # chronic back pain/chronic left LE/Bil PN-history of cervical spine injury-no evidence of metastatic disease.  Continue hydrocodone [for now as per Dr. Doy Hutching. stable.   # Iatrogenic hypo-hyperthyroidism- [AUG 2023]-TSH-100 mcg/day-; JAN 2024-TSH- LOW DEC 2023- synthoird- 100 mg/day; monitor for now.  stable. Will monitor for now.  TSH slowly improving. Stable.   # History of diabetes-OFF metformin [sec to diarrhea] [facility] 2023-Hb A1c-Hb A1C-5.9]. BF BG-164- stable.  ?defer to PCP.   # DISPOSITION:  # proceed with Atezoluzumab today.  # Follow up in 1st week of April- MD; labs- cbc/cmp;port flush; thyroid profile- CT prior- Dr.B   All questions were answered. The patient knows to call the clinic with any problems, questions or concerns.    Cammie Sickle, MD 11/26/2022 10:25 AM

## 2022-11-26 NOTE — Patient Instructions (Signed)
Recommend Voltaren gel topical 2-3 times a day- OTC>

## 2022-11-27 ENCOUNTER — Other Ambulatory Visit: Payer: Self-pay

## 2022-11-27 LAB — T4: T4, Total: 10.3 ug/dL (ref 4.5–12.0)

## 2022-12-03 DIAGNOSIS — E119 Type 2 diabetes mellitus without complications: Secondary | ICD-10-CM | POA: Diagnosis not present

## 2022-12-03 DIAGNOSIS — J449 Chronic obstructive pulmonary disease, unspecified: Secondary | ICD-10-CM | POA: Diagnosis not present

## 2022-12-03 DIAGNOSIS — R8290 Unspecified abnormal findings in urine: Secondary | ICD-10-CM | POA: Diagnosis not present

## 2022-12-03 DIAGNOSIS — E785 Hyperlipidemia, unspecified: Secondary | ICD-10-CM | POA: Diagnosis not present

## 2022-12-03 DIAGNOSIS — Z79899 Other long term (current) drug therapy: Secondary | ICD-10-CM | POA: Diagnosis not present

## 2022-12-03 DIAGNOSIS — Z0000 Encounter for general adult medical examination without abnormal findings: Secondary | ICD-10-CM | POA: Diagnosis not present

## 2022-12-03 DIAGNOSIS — E669 Obesity, unspecified: Secondary | ICD-10-CM | POA: Diagnosis not present

## 2022-12-03 DIAGNOSIS — M549 Dorsalgia, unspecified: Secondary | ICD-10-CM | POA: Diagnosis not present

## 2022-12-03 DIAGNOSIS — J439 Emphysema, unspecified: Secondary | ICD-10-CM | POA: Diagnosis not present

## 2022-12-03 DIAGNOSIS — Z1231 Encounter for screening mammogram for malignant neoplasm of breast: Secondary | ICD-10-CM | POA: Diagnosis not present

## 2022-12-03 DIAGNOSIS — I10 Essential (primary) hypertension: Secondary | ICD-10-CM | POA: Diagnosis not present

## 2022-12-05 ENCOUNTER — Other Ambulatory Visit: Payer: Self-pay

## 2022-12-06 ENCOUNTER — Encounter: Payer: Self-pay | Admitting: Internal Medicine

## 2022-12-06 DIAGNOSIS — Z1231 Encounter for screening mammogram for malignant neoplasm of breast: Secondary | ICD-10-CM

## 2022-12-15 DIAGNOSIS — Z1231 Encounter for screening mammogram for malignant neoplasm of breast: Secondary | ICD-10-CM | POA: Diagnosis not present

## 2023-01-03 ENCOUNTER — Ambulatory Visit
Admission: RE | Admit: 2023-01-03 | Discharge: 2023-01-03 | Disposition: A | Discharge status: Home / Self Care | Payer: Medicare HMO | Source: Ambulatory Visit | Attending: Internal Medicine | Admitting: Internal Medicine

## 2023-01-03 DIAGNOSIS — C3490 Malignant neoplasm of unspecified part of unspecified bronchus or lung: Secondary | ICD-10-CM | POA: Diagnosis not present

## 2023-01-03 DIAGNOSIS — D491 Neoplasm of unspecified behavior of respiratory system: Secondary | ICD-10-CM | POA: Insufficient documentation

## 2023-01-03 MED ORDER — IOHEXOL 300 MG/ML  SOLN
75.0000 mL | Freq: Once | INTRAMUSCULAR | Status: AC | PRN
  Administered 2023-01-03: 75 mL via INTRAVENOUS

## 2023-01-06 ENCOUNTER — Inpatient Hospital Stay: Payer: Medicare HMO | Admitting: Nurse Practitioner

## 2023-01-06 ENCOUNTER — Encounter: Payer: Self-pay | Admitting: Nurse Practitioner

## 2023-01-06 ENCOUNTER — Inpatient Hospital Stay: Payer: Medicare HMO | Attending: Internal Medicine

## 2023-01-06 VITALS — BP 130/71 | HR 71 | Temp 97.7°F | Resp 17 | Wt 183.8 lb

## 2023-01-06 DIAGNOSIS — Z95828 Presence of other vascular implants and grafts: Secondary | ICD-10-CM

## 2023-01-06 DIAGNOSIS — E032 Hypothyroidism due to medicaments and other exogenous substances: Secondary | ICD-10-CM | POA: Insufficient documentation

## 2023-01-06 DIAGNOSIS — C3411 Malignant neoplasm of upper lobe, right bronchus or lung: Secondary | ICD-10-CM | POA: Diagnosis not present

## 2023-01-06 DIAGNOSIS — Z452 Encounter for adjustment and management of vascular access device: Secondary | ICD-10-CM | POA: Diagnosis not present

## 2023-01-06 LAB — CMP (CANCER CENTER ONLY)
ALT: 19 U/L (ref 0–44)
AST: 33 U/L (ref 15–41)
Albumin: 3.8 g/dL (ref 3.5–5.0)
Alkaline Phosphatase: 61 U/L (ref 38–126)
Anion gap: 7 (ref 5–15)
BUN: 9 mg/dL (ref 8–23)
CO2: 26 mmol/L (ref 22–32)
Calcium: 8.8 mg/dL — ABNORMAL LOW (ref 8.9–10.3)
Chloride: 102 mmol/L (ref 98–111)
Creatinine: 0.71 mg/dL (ref 0.44–1.00)
GFR, Estimated: >60 mL/min (ref >60)
Glucose, Bld: 158 mg/dL — ABNORMAL HIGH (ref 70–99)
Potassium: 3.7 mmol/L (ref 3.5–5.1)
Sodium: 135 mmol/L (ref 135–145)
Total Bilirubin: 0.4 mg/dL (ref 0.3–1.2)
Total Protein: 7.1 g/dL (ref 6.5–8.1)

## 2023-01-06 LAB — CBC WITH DIFFERENTIAL (CANCER CENTER ONLY)
Abs Immature Granulocytes: 0.01 10*3/uL (ref 0.00–0.07)
Basophils Absolute: 0.1 10*3/uL (ref 0.0–0.1)
Basophils Relative: 1 %
Eosinophils Absolute: 0.9 10*3/uL — ABNORMAL HIGH (ref 0.0–0.5)
Eosinophils Relative: 13 %
HCT: 37.1 % (ref 36.0–46.0)
Hemoglobin: 12.9 g/dL (ref 12.0–15.0)
Immature Granulocytes: 0 %
Lymphocytes Relative: 42 %
Lymphs Abs: 3.1 10*3/uL (ref 0.7–4.0)
MCH: 31.3 pg (ref 26.0–34.0)
MCHC: 34.8 g/dL (ref 30.0–36.0)
MCV: 90 fL (ref 80.0–100.0)
Monocytes Absolute: 0.6 10*3/uL (ref 0.1–1.0)
Monocytes Relative: 8 %
Neutro Abs: 2.6 10*3/uL (ref 1.7–7.7)
Neutrophils Relative %: 36 %
Platelet Count: 167 10*3/uL (ref 150–400)
RBC: 4.12 MIL/uL (ref 3.87–5.11)
RDW: 12.3 % (ref 11.5–15.5)
WBC Count: 7.2 10*3/uL (ref 4.0–10.5)
nRBC: 0 % (ref 0.0–0.2)

## 2023-01-06 MED ORDER — SODIUM CHLORIDE 0.9% FLUSH
10.0000 mL | Freq: Once | INTRAVENOUS | Status: AC
  Administered 2023-01-06: 10 mL via INTRAVENOUS
  Filled 2023-01-06: qty 10

## 2023-01-06 MED ORDER — HEPARIN SOD (PORK) LOCK FLUSH 100 UNIT/ML IV SOLN
500.0000 [IU] | Freq: Once | INTRAVENOUS | Status: AC
  Administered 2023-01-06: 500 [IU] via INTRAVENOUS
  Filled 2023-01-06: qty 5

## 2023-01-06 NOTE — Progress Notes (Signed)
Patient here for oncology follow-up appointment,  concerns of arm pain and dizziness with inhaler use

## 2023-01-06 NOTE — Progress Notes (Signed)
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 Spiculated mass of the posterior right pulmonary apex measuring 3.2 x 2.8 cm, consistent with primary lung malignancy. 2. Enlarged right hilar and pretracheal lymph nodes measuring up to 1.6 x 1.5 cm, suspicious for nodal metastatic disease. 3. Recommend multidisciplinary thoracic referral for consideration of PET-CT metabolic characterization and tissue sampling. 4. Background of very fine centrilobular nodularity throughout the lungs, most concentrated at the apices. Scattered bronchiolar plugging. Findings are most consistent with smoking-related respiratory bronchiolitis. 5. Coarse, nodular contour of the liver in the included upper abdomen, suggestive of cirrhosis. 6. Coronary artery disease.  # COPD-  # DM- diet controlled; chronic back pain- hydrocodone 5 day; chronic left lower extremity weakness.  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 Currently on Synthroid 150 mcg/day-  # NGS/MOLECULAR TESTS:    # PALLIATIVE CARE EVALUATION:  # PAIN MANAGEMENT:    DIAGNOSIS:   STAGE:         ;  GOALS:  CURRENT/MOST RECENT THERAPY :    A. LYMPH NODE, HILAR, EXCISION:  - Lymph node negative for metastatic carcinoma.   B. LYMPH NODE, LEVEL 4, EXCISION:  - Lymph node negative for metastatic carcinoma.   C. LUNG, RIGHT UPPER LOBE, LOBECTOMY:  - Squamous cell carcinoma, 4.5 cm.  - Carcinoma extends through visceral pleura and focally involves  parietal pleura.  -  Surgical margins negative for carcinoma.  - One lymph node negative for carcinoma.  - See oncology table and comment.   D. SOFT TISSUE, CHEST WALL, BIOPSY:  - Fibroadipose tissue with focal inflammation and fibrosis.  - No carcinoma identified.   ONCOLOGY TABLE:  LUNG: Resection  Synchronous Tumors: Not applicable.  Total Number of Primary Tumors: 1  Procedure: Right upper lobectomy with lymph node biopsies and chest wall  biopsy.  Specimen Laterality: Right upper lobe.  Tumor Focality: Unifocal.  Tumor Site: Right upper lobe.  Tumor Size: 4.5 x 4 x 3 cm.  Histologic Type: Squamous cell carcinoma.  Visceral Pleura Invasion: Present.  Direct Invasion of Adjacent Structures: Focal involvement of parietal  pleura.  Lymphovascular Invasion: Not identified.  Margins: All surgical margins negative for carcinoma.  Regional Lymph Nodes:       Number of Lymph Nodes Involved: 0                            Nodal Sites with Tumor: 0       Number of Lymph Nodes Examined: 3                       Nodal Sites Examined: 2  Pathologic Stage Classification (pTNM, AJCC 8th Edition): pT3, pN0  Ancillary Studies: Can be performed if requested.   #Right upper lobe stage IIb-pT3 [not cisplatin candidate]; CarboTaxol x4. April 11th, 2023-Atezo q 3 w x1 year  # AUG 2023- IATROGENIC HYPOTHYRODISM- TSH 100; START SYNTHROID   Malignant neoplasm of upper lobe of right lung  04/24/2021 Initial Diagnosis   Malignant neoplasm of upper  lobe of right lung (HCC)   05/02/2021 Cancer Staging   Staging form: Lung, AJCC 8th Edition - Clinical: Stage IB (cT2a, cN0, cM0) - Signed by Earna Coder, MD on 05/02/2021 Stage prefix: Initial diagnosis   07/23/2021 Cancer Staging   Staging form: Lung, AJCC 8th Edition - Pathologic: Stage IIB (pT3, pN0, cM0) - Signed by Earna Coder, MD on 07/23/2021 Stage prefix: Initial diagnosis    HISTORY OF PRESENTING ILLNESS:  Patient accompanied by daughter. In  wheelchair.    Olivia Buchanan 72 y.o. 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 She's recovering from treatment. Continues to feel fatigued but no worse. Continues to have wrist pain. Questions topical that was recommended.    Review of Systems  Constitutional:  Positive for malaise/fatigue. Negative for chills, diaphoresis, fever and weight loss.  HENT:  Negative for nosebleeds and sore throat.   Eyes:  Negative for double vision.  Respiratory:  Positive for cough. Negative for hemoptysis, shortness of breath and wheezing.   Cardiovascular:  Negative for chest pain, palpitations, orthopnea and leg swelling.  Gastrointestinal:  Negative for abdominal pain, blood in stool, constipation, diarrhea, heartburn, melena and vomiting.  Genitourinary:  Negative for dysuria, frequency and urgency.  Musculoskeletal:  Positive for back pain, joint pain and neck pain.  Skin:  Negative for itching and rash.  Neurological:  Negative for tingling, focal weakness and weakness.  Endo/Heme/Allergies:  Does not bruise/bleed easily.  Psychiatric/Behavioral:  Negative for depression. The patient is not nervous/anxious and does not have insomnia.      MEDICAL HISTORY:  Past Medical History:  Diagnosis Date   Angioedema 12/08/2019   Anxiety    Aortic atherosclerosis    Asthma    CAD (coronary artery disease)    3 vessel   Cancer    COPD (chronic obstructive pulmonary disease) 12/08/2019   DDD (degenerative disc disease), lumbar    Depression    Diabetes mellitus without complication    no meds, diet controlled   Dyspnea    GERD (gastroesophageal reflux disease)    HTN (hypertension) 12/08/2019   Hyperlipidemia    Hypertension    Mass of upper lobe of right lung 04/02/2021   spiculated mass RIGHT posterior pulmonary apex with associated hilar and pretracheal LAD; measured 3.2 x 2.8 cm    Obesity (BMI 30-39.9)    T2DM (type 2 diabetes mellitus) 12/08/2019    SURGICAL HISTORY: Past Surgical History:  Procedure Laterality Date   BACK SURGERY     lumbar L5-S1 ruptured disc x 3 surgeries   CERVICAL FUSION  2009   FINE NEEDLE ASPIRATION  06/23/2021   Procedure: FINE NEEDLE ASPIRATION (FNA) LINEAR;  Surgeon: Josephine Igo, DO;  Location: [facility];  Service: Pulmonary;;   INTERCOSTAL NERVE BLOCK Right 07/09/2021   Procedure: INTERCOSTAL NERVE BLOCK;  Surgeon: Corliss Skains, MD;  Location: [facility] OR;  Service: Thoracic;  Laterality: Right;   IR IMAGING GUIDED PORT INSERTION  08/06/2021   LAPAROSCOPIC TOTAL HYSTERECTOMY  1998   with oophorectomy   LYMPH NODE DISSECTION Right 07/09/2021   Procedure: LYMPH NODE DISSECTION;  Surgeon: Corliss Skains, MD;  Location: [facility] OR;  Service: Thoracic;  Laterality: Right;   TONSILLECTOMY     VIDEO BRONCHOSCOPY WITH ENDOBRONCHIAL NAVIGATION N/A 04/15/2021   Procedure: VIDEO BRONCHOSCOPY WITH ENDOBRONCHIAL NAVIGATION;  Surgeon: Vida Rigger, MD;  Location: [facility];  Service:  Thoracic;  Laterality: N/A;   VIDEO BRONCHOSCOPY WITH ENDOBRONCHIAL ULTRASOUND N/A 04/15/2021   Procedure: VIDEO BRONCHOSCOPY WITH ENDOBRONCHIAL ULTRASOUND;  Surgeon: Vida Rigger, MD;  Location: [facility] OR[facility];  Service: Thoracic;  Laterality: N/A;   VIDEO BRONCHOSCOPY WITH ENDOBRONCHIAL ULTRASOUND N/A 06/23/2021   Procedure: VIDEO BRONCHOSCOPY WITH ENDOBRONCHIAL ULTRASOUND;  Surgeon: Josephine Igo, DO;  Location: [facility];  Service: Pulmonary;  Laterality: N/A;    SOCIAL HISTORY: Social History   Socioeconomic History   Marital status: Married    Spouse name: Dwight   Number of children: 3   Years of education: Not on file   Highest education level: Not on file  Occupational History   Not on file  Tobacco Use   Smoking status: Former    Packs/day: 3.00    Years: 50.00    Additional pack years: 0.00    Total pack years: 150.00    Types:  Cigarettes    Quit date: 2019    Years since quitting: 5.2   Smokeless tobacco: Never  Vaping Use   Vaping Use: Never used  Substance and Sexual Activity   Alcohol use: Not Currently   Drug use: Never   Sexual activity: Not on file    Comment: Hysterectomy  Other Topics Concern   Not on file  Social History Narrative   Lives in home; with husband; [place]; never smoking 2019; no alcohol. 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    Social Determinants of Health   Financial Resource Strain: Not on file  Food Insecurity: Not on file  Transportation Needs: Not on file  Physical Activity: Not on file  Stress: Not on file  Social Connections: Not on file  Intimate Partner Violence: Not on file    FAMILY HISTORY: Family History  Problem Relation Age of Onset   Bone cancer Maternal Uncle     ALLERGIES:  is allergic to ibuprofen and macrobid [nitrofurantoin].  MEDICATIONS:  Current Outpatient Medications  Medication Sig Dispense Refill   albuterol (VENTOLIN HFA) 108 (90 Base) MCG/ACT inhaler Inhale 1 puff into the lungs every 6 (six) hours as needed for wheezing.     aspirin EC 81 MG tablet Take 81 mg by mouth at bedtime.     Biotin 1000 MCG tablet Take 1,000 mcg by mouth 3 (three) times daily.     clonazePAM (KLONOPIN) 0.5 MG tablet Take 0.5 mg by mouth in the morning, at noon, and at bedtime.     diclofenac Sodium (VOLTAREN ARTHRITIS PAIN) 1 % GEL Apply topically 4 (four) times daily.     diphenoxylate-atropine (LOMOTIL) 2.5-0.025 MG tablet Take by mouth.     EPINEPHrine 0.3 mg/0.3 mL IJ SOAJ injection Inject 0.3 mLs (0.3 mg total) into the muscle as needed for anaphylaxis. 2 each 0   fluticasone-salmeterol (ADVAIR) 250-50 MCG/ACT AEPB Inhale 1 puff into the lungs in the morning and at bedtime.     gabapentin (NEURONTIN) 300 MG capsule Take 300 mg by mouth 3 (three) times daily. Take 300 mg qam and lunch and take 600 mg qhs      hydrochlorothiazide (HYDRODIURIL) 25 MG tablet Take 25 mg by mouth every other day.     HYDROcodone-acetaminophen (NORCO) 10-325 MG tablet Take 1 tablet by mouth every 6 (six) hours as needed.     ipratropium-albuterol (DUONEB) 0.5-2.5 (3) MG/3ML SOLN Take 3 mLs by nebulization 3 (three) times daily.     levothyroxine (SYNTHROID) 125 MCG tablet TAKE 1 TABLET(125 MCG) BY MOUTH  DAILY BEFORE BREAKFAST 90 tablet 1   lidocaine-prilocaine (EMLA) cream Apply topically once.     Melatonin 10 MG TABS Take 10 mg by mouth at bedtime.     Multiple Vitamin (MULTI-VITAMIN) tablet Take 1 tablet by mouth at bedtime.     nystatin cream (MYCOSTATIN) Apply 1 application topically 2 (two) times daily as needed for dry skin (around lips).     pantoprazole (PROTONIX) 40 MG tablet Take 40 mg by mouth at bedtime.     potassium chloride SA (KLOR-CON) 20 MEQ tablet Take 20 mEq by mouth 2 (two) times daily.     pravastatin (PRAVACHOL) 80 MG tablet Take 80 mg by mouth at bedtime.     prochlorperazine (COMPAZINE) 10 MG tablet Take 1 tablet (10 mg total) by mouth every 6 (six) hours as needed (Nausea or vomiting). 30 tablet 1   tiZANidine (ZANAFLEX) 4 MG tablet Take 4 mg by mouth 3 (three) times daily.     traZODone (DESYREL) 100 MG tablet Take 50 mg by mouth at bedtime.     venlafaxine XR (EFFEXOR-XR) 75 MG 24 hr capsule Take 150 mg by mouth at bedtime.     Vitamin D, Ergocalciferol, (DRISDOL) 1.25 MG (50000 UNIT) CAPS capsule TAKE 1 CAPSULE BY MOUTH 1 TIME A WEEK 12 capsule 1   docusate sodium (COLACE) 100 MG capsule Take 200 mg by mouth at bedtime. (Patient not taking: Reported on 10/15/2022)     metFORMIN (GLUCOPHAGE) 500 MG tablet TAKE 1 TABLET(500 MG) BY MOUTH TWICE DAILY WITH A MEAL (Patient not taking: Reported on 10/15/2022) 60 tablet 3   No current facility-administered medications for this visit.   Facility-Administered Medications Ordered in Other Visits  Medication Dose Route Frequency Provider Last Rate Last  Admin   heparin lock flush 100 UNIT/ML injection            sodium chloride flush (NS) 0.9 % injection 10 mL  10 mL Intravenous Once Louretta Shorten R, MD       .  PHYSICAL EXAMINATION: ECOG PERFORMANCE STATUS: 1 - Symptomatic but completely ambulatory  Vitals:   01/06/23 0952  BP: 130/71  Pulse: 71  Resp: 17  Temp: 97.7 F (36.5 C)  SpO2: 97%   Filed Weights   01/06/23 0952  Weight: 183 lb 12.8 oz (83.4 kg)   Physical Exam Vitals reviewed.  Constitutional:      Appearance: She is not ill-appearing.  Eyes:     General: No scleral icterus.    Conjunctiva/sclera: Conjunctivae normal.  Cardiovascular:     Rate and Rhythm: Normal rate and regular rhythm.  Pulmonary:     Effort: No respiratory distress.     Comments: Diminished bilaterally Abdominal:     General: There is no distension.     Palpations: Abdomen is soft.     Tenderness: There is no abdominal tenderness. There is no guarding.  Lymphadenopathy:     Cervical: No cervical adenopathy.  Skin:    General: Skin is warm.     Coloration: Skin is not pale.  Neurological:     Mental Status: She is alert. Mental status is at baseline.  Psychiatric:        Mood and Affect: Mood normal.        Behavior: Behavior normal.    LABORATORY DATA:  I have reviewed the data as listed Lab Results  Component Value Date   WBC 7.2 01/06/2023   HGB 12.9 01/06/2023   HCT 37.1 01/06/2023   MCV  90.0 01/06/2023   PLT 167 01/06/2023   Recent Labs    11/05/22 1009 11/26/22 0928 01/06/23 0932  NA 139 137 135  K 3.4* 3.5 3.7  CL 102 100 102  CO2 28 25 26   GLUCOSE 171* 180* 158*  BUN 9 12 9   CREATININE 0.64 0.75 0.71  CALCIUM 8.9 9.0 8.8*  GFRNONAA >60 >60 >60  PROT 7.5 7.4 7.1  ALBUMIN 3.9 3.9 3.8  AST 30 31 33  ALT 18 20 19   ALKPHOS 64 65 61  BILITOT 0.2* 0.4 0.4     RADIOGRAPHIC STUDIES: I have personally reviewed the radiological images as listed and agreed with the findings in the report. CT CHEST W  CONTRAST  Result Date: 01/03/2023 CLINICAL DATA:  Non-small-cell lung cancer. Restaging. * Tracking Code: BO * EXAM: CT CHEST WITH CONTRAST TECHNIQUE: Multidetector CT imaging of the chest was performed during intravenous contrast administration. RADIATION DOSE REDUCTION: This exam was performed according to the departmental dose-optimization program which includes automated exposure control, adjustment of the mA and/or kV according to patient size and/or use of iterative reconstruction technique. CONTRAST:  75mL OMNIPAQUE IOHEXOL 300 MG/ML  SOLN COMPARISON:  10/05/2022 FINDINGS: Cardiovascular: The heart size is normal. No substantial pericardial effusion. Coronary artery calcification is evident. Mild atherosclerotic calcification is noted in the wall of the thoracic aorta. Enlargement of the pulmonary outflow tract/main pulmonary arteries suggests pulmonary arterial hypertension. Right Port-A-Cath tip is positioned in the right atrium. Mediastinum/Nodes: 10 mm precarinal node measured previously is 8 mm today on 48/2. 10 mm subcarinal lymph node on 60/2 is similar to prior. No evidence for gross hilar lymphadenopathy although assessment is limited by the lack of intravenous contrast on the current study. The esophagus has normal imaging features. There is no axillary lymphadenopathy. Lungs/Pleura: Volume loss right hemithorax compatible with right upper lobectomy. Subpleural and peripheral parenchymal scarring in the right lower lobe is stable. No new suspicious pulmonary nodule or mass. No focal airspace consolidation. There is no evidence of pleural effusion. Upper Abdomen: Liver contour appears nodular, raising concern for cirrhosis. No adrenal nodule or mass. Musculoskeletal: No worrisome lytic or sclerotic osseous abnormality. IMPRESSION: 1. Stable exam. No new or progressive findings to suggest recurrent or metastatic disease. 2. Stable appearance of borderline mediastinal lymph nodes. 3. Liver contour  appears nodular, raising concern for cirrhosis. 4.  Aortic Atherosclerosis (ICD10-I70.0). Electronically Signed   By: Kennith Center M.D.   On: 01/03/2023 17:12    ASSESSMENT & PLAN:   No problem-specific Assessment & Plan notes found for this encounter.  # Malignant neoplasm of upper lobe of right lung - pT3N0 [4.5cm; Squamous cell ca- + involvement of the parietal pleura]; stage II. Currently s/p adjuvant carbo-taxol x4 [poor candidate for cisplatin].  PD-L1-5%; CT 10/05/22 stable. S/p adjuvant atezolizumab, completed 11/26/22. Treatment complicated by comorbidities and thyroid changes. CT 01/03/23 reviewed and stable without new or progressive disease. Borderline lymph  nodes are stable. Some nodularity of the liver possibly related to cirrhosis & aortic atherosclerosis. Recommend repeating in 3 months for surveillance.   # Right shoulder/wrist pain- recommend she see PCP for workup. Can trial topical voltaren gel in interim.    # Hypokalemia: Off hydrochlorothiazide - On Kdur BID- 3.7 today. Improved to stable. Stable.    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 Previously on cymbalta. Continue 300 mg AM, 600 mg qhs. Continue-vit D 50,000-stable.    # Chronic back pain/chronic left LE- history of cervical spine injury-no  evidence of metastatic disease. Continue hydrocodone, for now, per Dr Judithann Sheen. Stable.    # Iatrogenic hypo-hyperthyroidism- [AUG 2023]-TSH-100 mcg/day-; JAN 2024-TSH- LOW DEC 2023- synthoird- 100 mg/day. TSH today remains low at 0.062. Slowly improving. Continue current dose of synthroid.     # History of diabetes- Off metformin d/t diarrhea. MARCH 2023-Hb A1c-5.9]. Glucose 158 today. Improved. Continue f/u with PCP.    # Port- consider keeping x 1 year if tolerating. Port flush every 6-8 weeks. Flushed today.    DISPOSITION:  6 weeks- port flush 12 week- ct chest Day to week later- port/lab (cbc, cmp, tsh), Dr Donneta Romberg- la  All questions were  answered. The patient knows to call the clinic with any problems, questions or concerns.   Alinda Dooms, NP 01/06/2023

## 2023-01-07 ENCOUNTER — Other Ambulatory Visit: Payer: Self-pay

## 2023-01-07 LAB — THYROID PANEL WITH TSH
Free Thyroxine Index: 2.8 (ref 1.2–4.9)
T3 Uptake Ratio: 28 % (ref 24–39)
T4, Total: 9.9 ug/dL (ref 4.5–12.0)
TSH: 0.062 u[IU]/mL — ABNORMAL LOW (ref 0.450–4.500)

## 2023-02-17 ENCOUNTER — Inpatient Hospital Stay: Payer: Medicare HMO | Attending: Internal Medicine

## 2023-02-17 DIAGNOSIS — Z452 Encounter for adjustment and management of vascular access device: Secondary | ICD-10-CM | POA: Insufficient documentation

## 2023-02-17 DIAGNOSIS — C3411 Malignant neoplasm of upper lobe, right bronchus or lung: Secondary | ICD-10-CM | POA: Diagnosis not present

## 2023-02-17 DIAGNOSIS — Z95828 Presence of other vascular implants and grafts: Secondary | ICD-10-CM

## 2023-02-17 MED ORDER — SODIUM CHLORIDE 0.9% FLUSH
10.0000 mL | Freq: Once | INTRAVENOUS | Status: AC
  Administered 2023-02-17: 10 mL via INTRAVENOUS
  Filled 2023-02-17: qty 10

## 2023-02-17 MED ORDER — HEPARIN SOD (PORK) LOCK FLUSH 100 UNIT/ML IV SOLN
500.0000 [IU] | Freq: Once | INTRAVENOUS | Status: AC
  Administered 2023-02-17: 500 [IU] via INTRAVENOUS
  Filled 2023-02-17: qty 5

## 2023-02-21 ENCOUNTER — Encounter: Payer: Self-pay | Admitting: Internal Medicine

## 2023-03-07 DIAGNOSIS — E118 Type 2 diabetes mellitus with unspecified complications: Secondary | ICD-10-CM | POA: Diagnosis not present

## 2023-03-07 DIAGNOSIS — I10 Essential (primary) hypertension: Secondary | ICD-10-CM | POA: Diagnosis not present

## 2023-03-07 DIAGNOSIS — J431 Panlobular emphysema: Secondary | ICD-10-CM | POA: Diagnosis not present

## 2023-03-07 DIAGNOSIS — J449 Chronic obstructive pulmonary disease, unspecified: Secondary | ICD-10-CM | POA: Diagnosis not present

## 2023-03-07 DIAGNOSIS — Z1211 Encounter for screening for malignant neoplasm of colon: Secondary | ICD-10-CM | POA: Diagnosis not present

## 2023-03-07 DIAGNOSIS — E782 Mixed hyperlipidemia: Secondary | ICD-10-CM | POA: Diagnosis not present

## 2023-03-07 DIAGNOSIS — E039 Hypothyroidism, unspecified: Secondary | ICD-10-CM | POA: Diagnosis not present

## 2023-03-07 DIAGNOSIS — Z79899 Other long term (current) drug therapy: Secondary | ICD-10-CM | POA: Diagnosis not present

## 2023-03-12 IMAGING — DX DG CHEST 2V
2 series · 2 of 2 positions shown · non-contrast
Comparison: 07/12/2021

CLINICAL DATA: Chest pain

EXAM:
CHEST - 2 VIEW

[w chest pa]
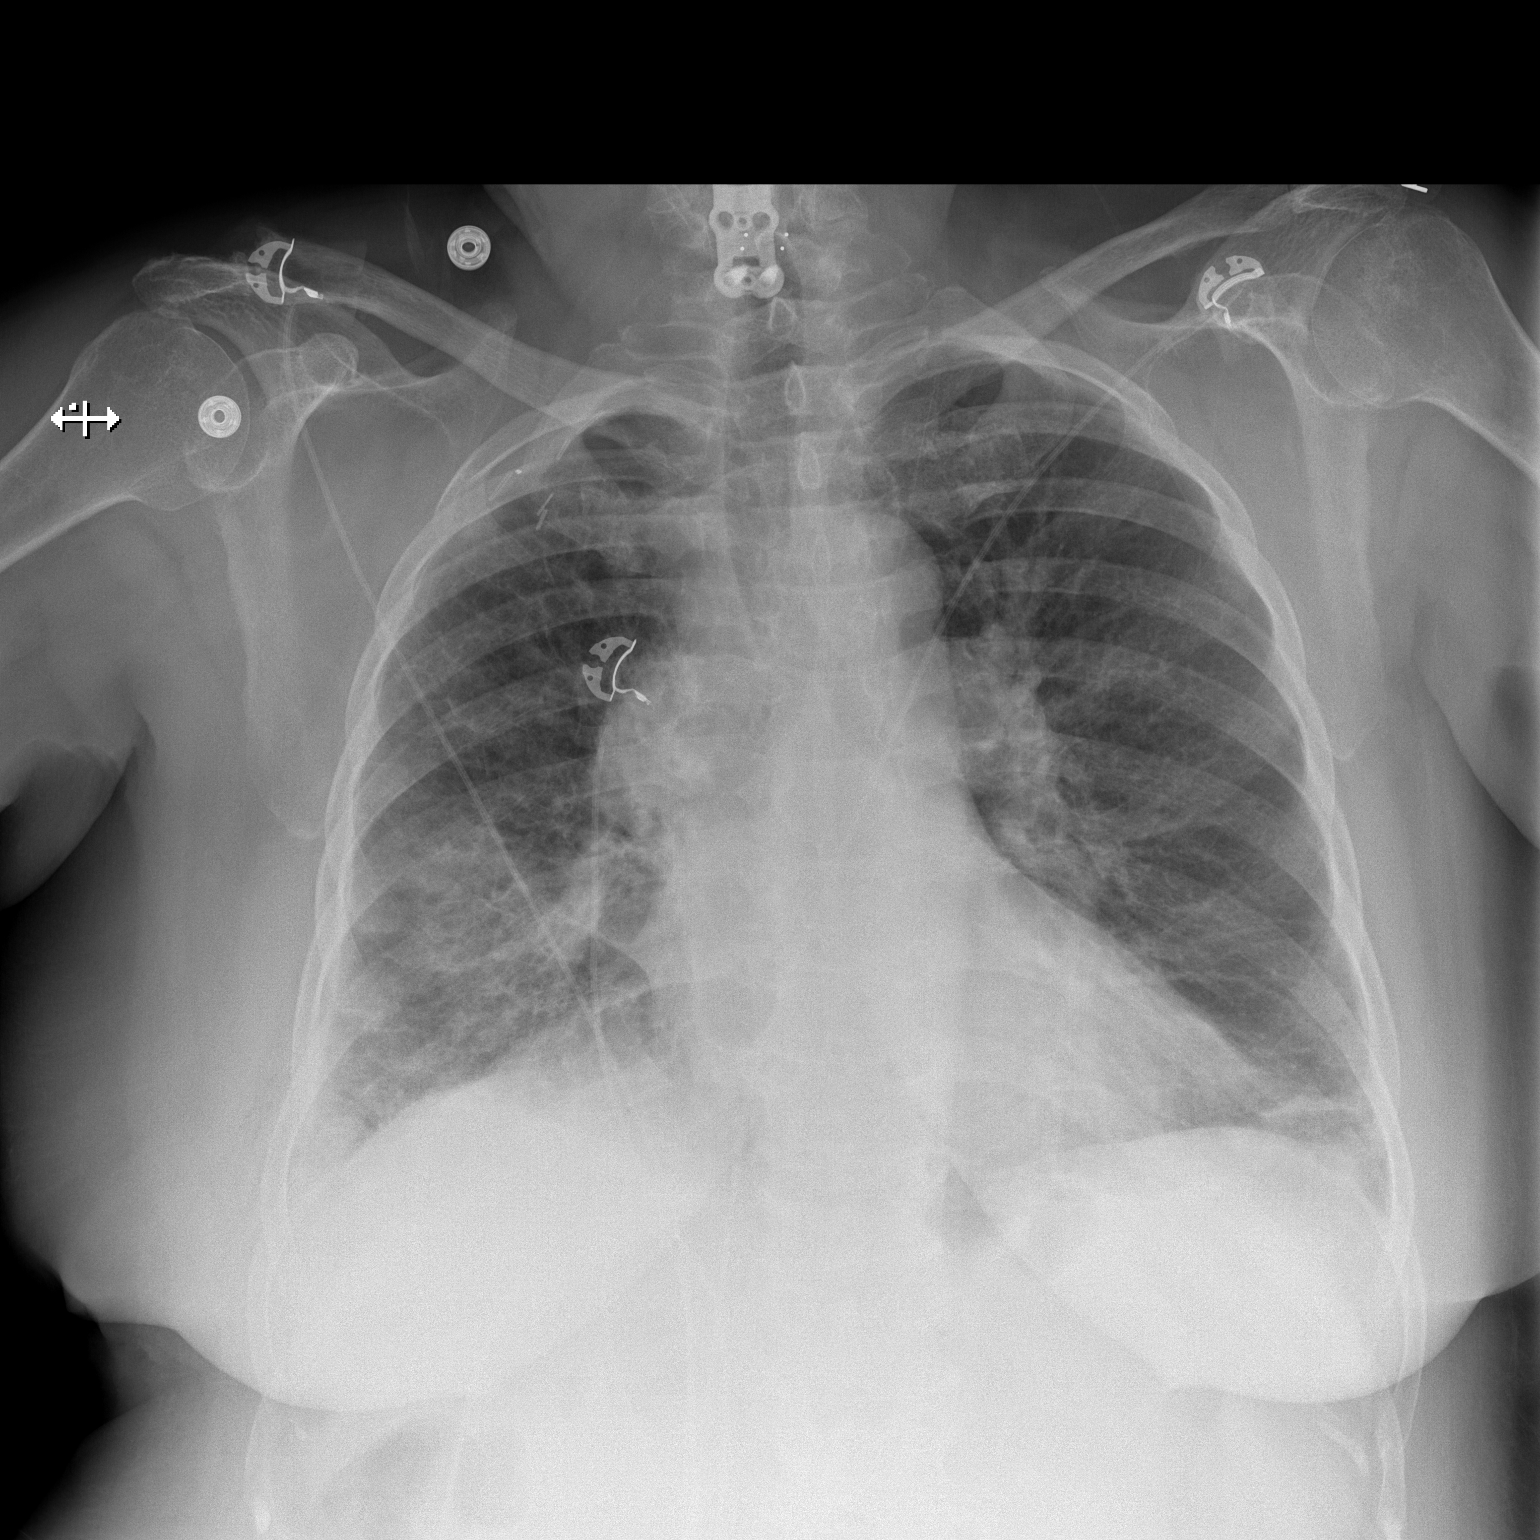

[w chest lat]
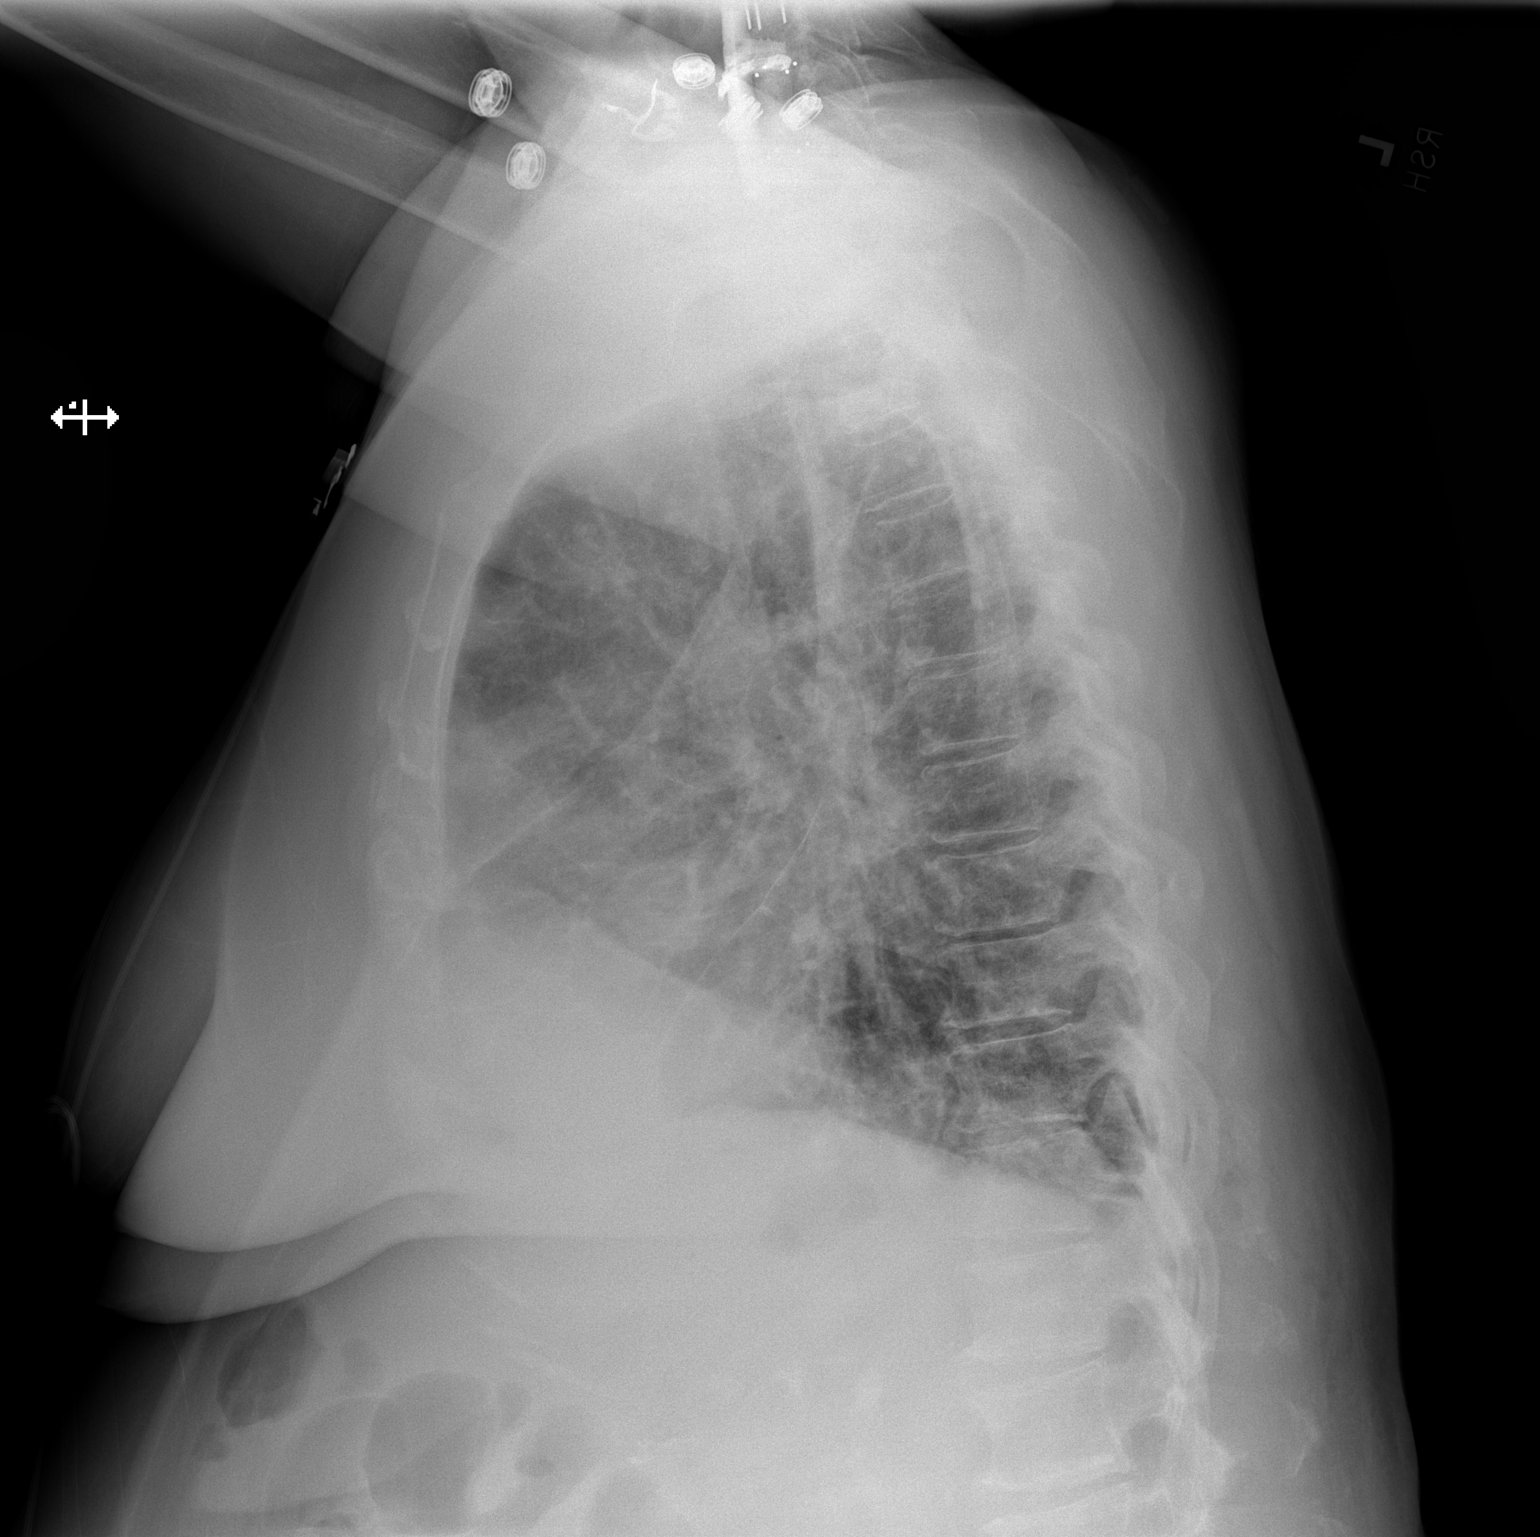

[2 of 2 positions shown; findings below may reference images not displayed]

FINDINGS: Patchy airspace disease noted right mid and lower lung with probable
atelectasis at the left base. Tiny bilateral pleural effusions
suspected. Tiny right apical pneumothorax seen on the previous study
not clearly demonstrated on the current film. Similar soft tissue
fullness in the right hilar region. The visualized bony structures
of the thorax show no acute abnormality. Telemetry leads overlie the
chest.
IMPRESSION: 1. Interval increase in patchy airspace disease at the right base,
likely atelectatic although superimposed infection cannot be
excluded.
2. The tiny right apical pneumothorax seen on yesterday's study is
not evident on today's exam.
3. Tiny bilateral pleural effusions.

## 2023-03-13 IMAGING — DX DG CHEST 1V PORT
1 series · 1 of 1 positions shown · non-contrast
Comparison: 07/13/2021, 07/12/2021

CLINICAL DATA: Chest soreness after surgery

EXAM:
PORTABLE CHEST 1 VIEW

[chest ap]
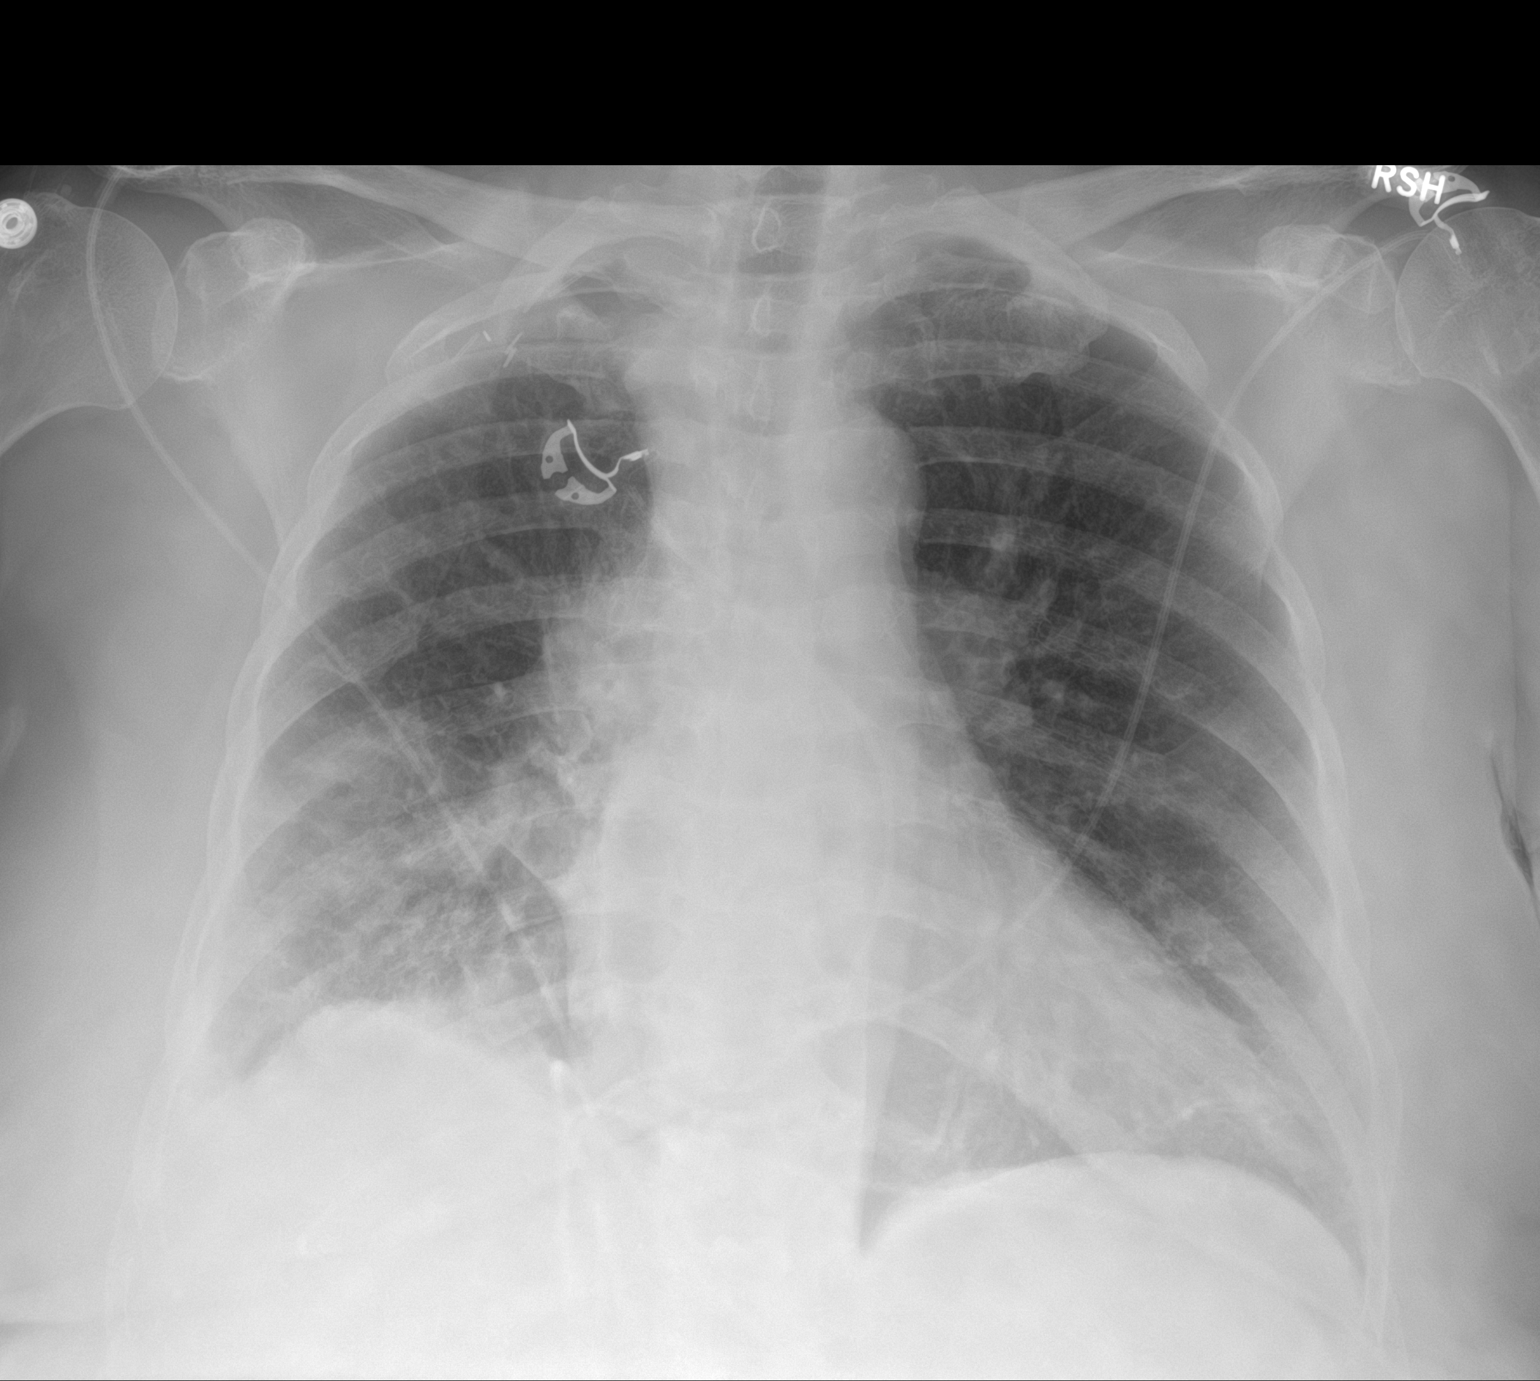

[1 of 1 positions shown; findings below may reference images not displayed]

FINDINGS: Stable cardiomediastinal contours. Postsurgical changes from right
upper lobectomy. No discernible pneumothorax. Continued worsening of
patchy airspace opacity in the right lower lobe. Probable small
right pleural effusion.
IMPRESSION: 1. Continued worsening of patchy airspace opacity in the right lower
lobe, concerning for pneumonia.
2. Probable small right pleural effusion.

## 2023-03-17 ENCOUNTER — Other Ambulatory Visit: Payer: Self-pay | Admitting: *Deleted

## 2023-03-17 MED ORDER — LIDOCAINE-PRILOCAINE 2.5-2.5 % EX CREA: TOPICAL_CREAM | CUTANEOUS | 2 refills | Status: AC

## 2023-04-07 ENCOUNTER — Other Ambulatory Visit: Payer: Self-pay

## 2023-04-12 ENCOUNTER — Ambulatory Visit: Admission: RE | Admit: 2023-04-12 | Payer: Medicare HMO | Source: Ambulatory Visit

## 2023-04-14 ENCOUNTER — Other Ambulatory Visit: Payer: Self-pay

## 2023-04-19 ENCOUNTER — Other Ambulatory Visit: Payer: Self-pay

## 2023-04-19 ENCOUNTER — Other Ambulatory Visit: Payer: Medicare HMO

## 2023-04-19 ENCOUNTER — Ambulatory Visit: Payer: Medicare HMO | Admitting: Internal Medicine

## 2023-04-25 ENCOUNTER — Other Ambulatory Visit: Payer: Self-pay | Admitting: Internal Medicine

## 2023-04-25 ENCOUNTER — Ambulatory Visit
Admission: RE | Admit: 2023-04-25 | Discharge: 2023-04-25 | Disposition: A | Discharge status: Home / Self Care | Payer: Medicare HMO | Source: Ambulatory Visit | Attending: Nurse Practitioner | Admitting: Nurse Practitioner

## 2023-04-25 DIAGNOSIS — C3411 Malignant neoplasm of upper lobe, right bronchus or lung: Secondary | ICD-10-CM | POA: Diagnosis not present

## 2023-04-25 DIAGNOSIS — C3490 Malignant neoplasm of unspecified part of unspecified bronchus or lung: Secondary | ICD-10-CM | POA: Diagnosis not present

## 2023-04-25 DIAGNOSIS — I700 Atherosclerosis of aorta: Secondary | ICD-10-CM | POA: Diagnosis not present

## 2023-04-25 MED ORDER — IOHEXOL 300 MG/ML  SOLN
75.0000 mL | Freq: Once | INTRAMUSCULAR | Status: AC | PRN
  Administered 2023-04-25: 75 mL via INTRAVENOUS

## 2023-05-03 ENCOUNTER — Inpatient Hospital Stay: Payer: Medicare HMO | Attending: Internal Medicine

## 2023-05-03 ENCOUNTER — Inpatient Hospital Stay (HOSPITAL_BASED_OUTPATIENT_CLINIC_OR_DEPARTMENT_OTHER): Payer: Medicare HMO | Admitting: Internal Medicine

## 2023-05-03 ENCOUNTER — Encounter: Payer: Self-pay | Admitting: Internal Medicine

## 2023-05-03 VITALS — BP 114/69 | HR 73 | Temp 97.6°F | Ht 59.0 in | Wt 183.9 lb

## 2023-05-03 DIAGNOSIS — E032 Hypothyroidism due to medicaments and other exogenous substances: Secondary | ICD-10-CM

## 2023-05-03 DIAGNOSIS — E119 Type 2 diabetes mellitus without complications: Secondary | ICD-10-CM | POA: Diagnosis not present

## 2023-05-03 DIAGNOSIS — G629 Polyneuropathy, unspecified: Secondary | ICD-10-CM | POA: Insufficient documentation

## 2023-05-03 DIAGNOSIS — I2510 Atherosclerotic heart disease of native coronary artery without angina pectoris: Secondary | ICD-10-CM | POA: Diagnosis not present

## 2023-05-03 DIAGNOSIS — Z85118 Personal history of other malignant neoplasm of bronchus and lung: Secondary | ICD-10-CM | POA: Insufficient documentation

## 2023-05-03 DIAGNOSIS — Z08 Encounter for follow-up examination after completed treatment for malignant neoplasm: Secondary | ICD-10-CM | POA: Diagnosis not present

## 2023-05-03 DIAGNOSIS — M549 Dorsalgia, unspecified: Secondary | ICD-10-CM | POA: Insufficient documentation

## 2023-05-03 DIAGNOSIS — C3411 Malignant neoplasm of upper lobe, right bronchus or lung: Secondary | ICD-10-CM

## 2023-05-03 DIAGNOSIS — E876 Hypokalemia: Secondary | ICD-10-CM | POA: Insufficient documentation

## 2023-05-03 DIAGNOSIS — G8929 Other chronic pain: Secondary | ICD-10-CM | POA: Insufficient documentation

## 2023-05-03 LAB — COMPREHENSIVE METABOLIC PANEL
ALT: 17 U/L (ref 0–44)
AST: 36 U/L (ref 15–41)
Albumin: 3.8 g/dL (ref 3.5–5.0)
Alkaline Phosphatase: 58 U/L (ref 38–126)
Anion gap: 6 (ref 5–15)
BUN: 9 mg/dL (ref 8–23)
CO2: 26 mmol/L (ref 22–32)
Calcium: 8.7 mg/dL — ABNORMAL LOW (ref 8.9–10.3)
Chloride: 105 mmol/L (ref 98–111)
Creatinine, Ser: 0.67 mg/dL (ref 0.44–1.00)
GFR, Estimated: >60 mL/min (ref >60)
Glucose, Bld: 91 mg/dL (ref 70–99)
Potassium: 3.9 mmol/L (ref 3.5–5.1)
Sodium: 137 mmol/L (ref 135–145)
Total Bilirubin: 0.3 mg/dL (ref 0.3–1.2)
Total Protein: 7.1 g/dL (ref 6.5–8.1)

## 2023-05-03 LAB — CBC WITH DIFFERENTIAL/PLATELET
Abs Immature Granulocytes: 0.01 10*3/uL (ref 0.00–0.07)
Basophils Absolute: 0.1 10*3/uL (ref 0.0–0.1)
Basophils Relative: 1 %
Eosinophils Absolute: 1 10*3/uL — ABNORMAL HIGH (ref 0.0–0.5)
Eosinophils Relative: 14 %
HCT: 37.9 % (ref 36.0–46.0)
Hemoglobin: 12.8 g/dL (ref 12.0–15.0)
Immature Granulocytes: 0 %
Lymphocytes Relative: 37 %
Lymphs Abs: 2.7 10*3/uL (ref 0.7–4.0)
MCH: 31.1 pg (ref 26.0–34.0)
MCHC: 33.8 g/dL (ref 30.0–36.0)
MCV: 92.2 fL (ref 80.0–100.0)
Monocytes Absolute: 0.5 10*3/uL (ref 0.1–1.0)
Monocytes Relative: 7 %
Neutro Abs: 2.9 10*3/uL (ref 1.7–7.7)
Neutrophils Relative %: 41 %
Platelets: 146 10*3/uL — ABNORMAL LOW (ref 150–400)
RBC: 4.11 MIL/uL (ref 3.87–5.11)
RDW: 12.5 % (ref 11.5–15.5)
WBC: 7.1 10*3/uL (ref 4.0–10.5)
nRBC: 0 % (ref 0.0–0.2)

## 2023-05-03 NOTE — Progress Notes (Signed)
Results of CT.

## 2023-05-03 NOTE — Progress Notes (Signed)
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 Spiculated mass of the posterior right pulmonary apex measuring 3.2 x 2.8 cm, consistent with primary lung malignancy. 2. Enlarged right hilar and pretracheal lymph nodes measuring up to 1.6 x 1.5 cm, suspicious for nodal metastatic disease. 3. Recommend multidisciplinary thoracic referral for consideration of PET-CT metabolic characterization and tissue sampling. 4. Background of very fine centrilobular nodularity throughout the lungs, most concentrated at the apices. Scattered bronchiolar plugging. Findings are most consistent with smoking-related respiratory bronchiolitis. 5. Coarse, nodular contour of the liver in the included upper abdomen, suggestive of cirrhosis. 6. Coronary artery disease.  # COPD-  # DM- diet controlled; chronic back pain- hydrocodone 5 day; chronic left lower extremity weakness.  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 Currently on Synthroid 150 mcg/day-  # NGS/MOLECULAR TESTS:    # PALLIATIVE CARE EVALUATION:  # PAIN MANAGEMENT:    DIAGNOSIS:   STAGE:         ;  GOALS:  CURRENT/MOST RECENT THERAPY :    A. LYMPH NODE, HILAR, EXCISION:  - Lymph node negative for metastatic carcinoma.   B. LYMPH NODE, LEVEL 4, EXCISION:  - Lymph node negative for metastatic carcinoma.   C. LUNG, RIGHT UPPER LOBE, LOBECTOMY:  - Squamous cell carcinoma, 4.5 cm.  - Carcinoma extends through visceral pleura and focally involves  parietal pleura.  -  Surgical margins negative for carcinoma.  - One lymph node negative for carcinoma.  - See oncology table and comment.   D. SOFT TISSUE, CHEST WALL, BIOPSY:  - Fibroadipose tissue with focal inflammation and fibrosis.  - No carcinoma identified.   ONCOLOGY TABLE:  LUNG: Resection  Synchronous Tumors: Not applicable.  Total Number of Primary Tumors: 1  Procedure: Right upper lobectomy with lymph node biopsies and chest wall  biopsy.  Specimen Laterality: Right upper lobe.  Tumor Focality: Unifocal.  Tumor Site: Right upper lobe.  Tumor Size: 4.5 x 4 x 3 cm.  Histologic Type: Squamous cell carcinoma.  Visceral Pleura Invasion: Present.  Direct Invasion of Adjacent Structures: Focal involvement of parietal  pleura.  Lymphovascular Invasion: Not identified.  Margins: All surgical margins negative for carcinoma.  Regional Lymph Nodes:       Number of Lymph Nodes Involved: 0                            Nodal Sites with Tumor: 0       Number of Lymph Nodes Examined: 3                       Nodal Sites Examined: 2  Pathologic Stage Classification (pTNM, AJCC 8th Edition): pT3, pN0  Ancillary Studies: Can be performed if requested.   #Right upper lobe stage IIb-pT3 [not cisplatin candidate]; CarboTaxol x4. April 11th, 2023-Atezo q 3 w x1 year  # AUG 2023- IATROGENIC HYPOTHYRODISM- TSH 100; START SYNTHROID   Malignant neoplasm of upper lobe of right lung (HCC)  04/24/2021 Initial Diagnosis   Malignant neoplasm of  upper lobe of right lung (HCC)   05/02/2021 Cancer Staging   Staging form: Lung, AJCC 8th Edition - Clinical: Stage IB (cT2a, cN0, cM0) - Signed by Earna Coder, MD on 05/02/2021 Stage prefix: Initial diagnosis   07/23/2021 Cancer Staging   Staging form: Lung, AJCC 8th Edition - Pathologic: Stage IIB (pT3, pN0, cM0) - Signed by Earna Coder, MD on 07/23/2021 Stage prefix: Initial diagnosis    HISTORY OF PRESENTING ILLNESS:  Patient is alone.  in a wheel  chair.   Olivia Buchanan 72 y.o.  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  Chronic mild shortness of breath chronic cough not any worse.  Chronic mild fatigue. No worsening tingling or numbness.  Chronic back pain.   No chest pain.  Headaches improved.    Concerned about lack of weight loss on monjauro.   Review of Systems  Constitutional:  Positive for malaise/fatigue and weight loss. Negative for chills, diaphoresis and fever.  HENT:  Negative for nosebleeds and sore throat.   Eyes:  Negative for double vision.  Respiratory:  Positive for cough and shortness of breath. Negative for hemoptysis and wheezing.   Cardiovascular:  Negative for chest pain, palpitations, orthopnea and leg swelling.  Gastrointestinal:  Negative for abdominal pain, blood in stool, constipation, diarrhea, heartburn, melena and vomiting.  Genitourinary:  Negative for dysuria, frequency and urgency.  Musculoskeletal:  Positive for back pain, joint pain and neck pain.  Skin: Negative.  Negative for itching and rash.  Neurological:  Negative for tingling, focal weakness and weakness.  Endo/Heme/Allergies:  Does not bruise/bleed easily.  Psychiatric/Behavioral:  Negative for depression. The patient is not nervous/anxious and does not have insomnia.      MEDICAL HISTORY:  Past Medical History:  Diagnosis Date   Angioedema 12/08/2019   Anxiety    Aortic atherosclerosis (HCC)    Asthma    CAD (coronary artery disease)    3 vessel   Cancer (HCC)    COPD (chronic obstructive pulmonary disease) (HCC) 12/08/2019   DDD (degenerative disc disease), lumbar    Depression    Diabetes mellitus without complication (HCC)    no meds, diet controlled   Dyspnea    GERD (gastroesophageal reflux disease)    HTN (hypertension) 12/08/2019   Hyperlipidemia    Hypertension    Mass of upper lobe of right lung 04/02/2021   spiculated mass  RIGHT posterior pulmonary apex with associated hilar and pretracheal LAD; measured 3.2 x 2.8 cm   Obesity (BMI 30-39.9)    T2DM (type 2 diabetes mellitus) (HCC) 12/08/2019    SURGICAL HISTORY: Past Surgical History:  Procedure Laterality Date   BACK SURGERY     lumbar L5-S1 ruptured disc x 3 surgeries   CERVICAL FUSION  2009   FINE NEEDLE ASPIRATION  06/23/2021   Procedure: FINE NEEDLE ASPIRATION (FNA) LINEAR;  Surgeon: Josephine Igo, DO;  Location: [facility];  Service: Pulmonary;;   INTERCOSTAL NERVE BLOCK Right 07/09/2021   Procedure: INTERCOSTAL NERVE BLOCK;  Surgeon: Corliss Skains, MD;  Location: [facility] OR;  Service: Thoracic;  Laterality: Right;   IR IMAGING GUIDED PORT INSERTION  08/06/2021   LAPAROSCOPIC TOTAL HYSTERECTOMY  1998   with oophorectomy   LYMPH NODE DISSECTION Right 07/09/2021   Procedure: LYMPH NODE DISSECTION;  Surgeon: Corliss Skains, MD;  Location: [facility] OR;  Service: Thoracic;  Laterality: Right;   TONSILLECTOMY  VIDEO BRONCHOSCOPY WITH ENDOBRONCHIAL NAVIGATION N/A 04/15/2021   Procedure: VIDEO BRONCHOSCOPY WITH ENDOBRONCHIAL NAVIGATION;  Surgeon: Vida Rigger, MD;  Location: [facility] ORS;  Service: Thoracic;  Laterality: N/A;   VIDEO BRONCHOSCOPY WITH ENDOBRONCHIAL ULTRASOUND N/A 04/15/2021   Procedure: VIDEO BRONCHOSCOPY WITH ENDOBRONCHIAL ULTRASOUND;  Surgeon: Vida Rigger, MD;  Location: [facility] ORS;  Service: Thoracic;  Laterality: N/A;   VIDEO BRONCHOSCOPY WITH ENDOBRONCHIAL ULTRASOUND N/A 06/23/2021   Procedure: VIDEO BRONCHOSCOPY WITH ENDOBRONCHIAL ULTRASOUND;  Surgeon: Josephine Igo, DO;  Location: [facility];  Service: Pulmonary;  Laterality: N/A;    SOCIAL HISTORY: Social History   Socioeconomic History   Marital status: Married    Spouse name: Dwight   Number of children: 3   Years of education: Not on file   Highest education level: Not on file  Occupational History   Not on file  Tobacco Use   Smoking status: Former     Current packs/day: 0.00    Average packs/day: 3.0 packs/day for 50.0 years (150.0 ttl pk-yrs)    Types: Cigarettes    Start date: 12    Quit date: 2019    Years since quitting: 5.5   Smokeless tobacco: Never  Vaping Use   Vaping status: Never Used  Substance and Sexual Activity   Alcohol use: Not Currently   Drug use: Never   Sexual activity: Not on file    Comment: Hysterectomy  Other Topics Concern   Not on file  Social History Narrative   Lives in home; with husband; [place]; never smoking 2019; no alcohol. 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    Social Determinants of Health   Financial Resource Strain: Low Risk  (12/03/2022)   Received from [facility], [facility]   Overall Financial Resource Strain (CARDIA)    Difficulty of Paying Living Expenses: Not hard at all  Food Insecurity: No Food Insecurity (12/03/2022)   Received from [facility], [facility]   Hunger Vital Sign    Worried About Running Out of Food in the Last Year: Never true    Ran Out of Food in the Last Year: Never true  Transportation Needs: No Transportation Needs (12/03/2022)   Received from [facility], [facility]   [facility] - Transportation    In the past 12 months, has lack of transportation kept you from medical appointments or from getting medications?: No    Lack of Transportation (Non-Medical): No  Physical Activity: Not on file  Stress: Not on file  Social Connections: Not on file  Intimate Partner Violence: Not on file    FAMILY HISTORY: Family History  Problem Relation Age of Onset   Bone cancer Maternal Uncle     ALLERGIES:  is allergic to ibuprofen and macrobid [nitrofurantoin].  MEDICATIONS:  Current Outpatient Medications  Medication Sig Dispense Refill   albuterol (VENTOLIN HFA) 108 (90 Base) MCG/ACT inhaler Inhale 1 puff into the lungs  every 6 (six) hours as needed for wheezing.     aspirin EC 81 MG tablet Take 81 mg by mouth at bedtime.     Biotin 1000 MCG tablet Take 1,000 mcg by mouth 3 (three) times daily.     clonazePAM (KLONOPIN) 0.5 MG tablet Take 0.5 mg by mouth in the morning, at noon, and at bedtime.     diclofenac Sodium (VOLTAREN ARTHRITIS PAIN) 1 % GEL Apply topically 4 (four) times daily.     diphenoxylate-atropine (  LOMOTIL) 2.5-0.025 MG tablet Take by mouth.     EPINEPHrine 0.3 mg/0.3 mL IJ SOAJ injection Inject 0.3 mLs (0.3 mg total) into the muscle as needed for anaphylaxis. 2 each 0   fluticasone-salmeterol (ADVAIR) 250-50 MCG/ACT AEPB Inhale 1 puff into the lungs in the morning and at bedtime.     gabapentin (NEURONTIN) 300 MG capsule Take 300 mg by mouth 3 (three) times daily. Take 300 mg qam and lunch and take 600 mg qhs     hydrochlorothiazide (HYDRODIURIL) 25 MG tablet Take 25 mg by mouth every other day.     HYDROcodone-acetaminophen (NORCO) 10-325 MG tablet Take 1 tablet by mouth every 6 (six) hours as needed.     ipratropium-albuterol (DUONEB) 0.5-2.5 (3) MG/3ML SOLN Take 3 mLs by nebulization 3 (three) times daily.     levothyroxine (SYNTHROID) 125 MCG tablet TAKE 1 TABLET(125 MCG) BY MOUTH DAILY BEFORE BREAKFAST 90 tablet 1   lidocaine-prilocaine (EMLA) cream Use as directed prior to port being accessed 30 g 2   Melatonin 10 MG TABS Take 10 mg by mouth at bedtime.     MOUNJARO 2.5 MG/0.5ML Pen Inject 2.5 mg into the skin once a week.     Multiple Vitamin (MULTI-VITAMIN) tablet Take 1 tablet by mouth at bedtime.     nystatin cream (MYCOSTATIN) Apply 1 application topically 2 (two) times daily as needed for dry skin (around lips).     pantoprazole (PROTONIX) 40 MG tablet Take 40 mg by mouth at bedtime.     potassium chloride SA (KLOR-CON) 20 MEQ tablet Take 20 mEq by mouth 2 (two) times daily.     pravastatin (PRAVACHOL) 80 MG tablet Take 80 mg by mouth at bedtime.     prochlorperazine (COMPAZINE) 10  MG tablet Take 1 tablet (10 mg total) by mouth every 6 (six) hours as needed (Nausea or vomiting). 30 tablet 1   tiZANidine (ZANAFLEX) 4 MG tablet Take 4 mg by mouth 3 (three) times daily.     traZODone (DESYREL) 100 MG tablet Take 50 mg by mouth at bedtime.     venlafaxine XR (EFFEXOR-XR) 75 MG 24 hr capsule Take 150 mg by mouth at bedtime.     Vitamin D, Ergocalciferol, (DRISDOL) 1.25 MG (50000 UNIT) CAPS capsule TAKE 1 CAPSULE BY MOUTH 1 TIME A WEEK 12 capsule 1   docusate sodium (COLACE) 100 MG capsule Take 200 mg by mouth at bedtime. (Patient not taking: Reported on 10/15/2022)     No current facility-administered medications for this visit.   Facility-Administered Medications Ordered in Other Visits  Medication Dose Route Frequency Provider Last Rate Last Admin   heparin lock flush 100 UNIT/ML injection            sodium chloride flush (NS) 0.9 % injection 10 mL  10 mL Intravenous Once Louretta Shorten R, MD          .  PHYSICAL EXAMINATION: ECOG PERFORMANCE STATUS: 1 - Symptomatic but completely ambulatory  Vitals:   05/03/23 1257  BP: 114/69  Pulse: 73  Temp: 97.6 F (36.4 C)  SpO2: 97%    Filed Weights   05/03/23 1257  Weight: 183 lb 14.4 oz (83.4 kg)     Physical Exam Vitals and nursing note reviewed.  HENT:     Head: Normocephalic and atraumatic.     Mouth/Throat:     Mouth: Mucous membranes are moist.     Pharynx: Oropharynx is clear. No oropharyngeal exudate.  Eyes:  Extraocular Movements: Extraocular movements intact.     Pupils: Pupils are equal, round, and reactive to light.  Cardiovascular:     Rate and Rhythm: Normal rate and regular rhythm.  Pulmonary:     Effort: No respiratory distress.     Breath sounds: No wheezing.     Comments: Decreased breath sounds bilaterally at bases.  No wheeze or crackles Abdominal:     General: Bowel sounds are normal. There is no distension.     Palpations: Abdomen is soft. There is no mass.     Tenderness:  There is no abdominal tenderness. There is no guarding or rebound.  Musculoskeletal:        General: No tenderness. Normal range of motion.     Cervical back: Normal range of motion and neck supple.  Skin:    General: Skin is warm.  Neurological:     General: No focal deficit present.     Mental Status: She is alert and oriented to person, place, and time.     Comments: 4-5 strength in the left lower extremity/chronic.  Psychiatric:        Mood and Affect: Affect normal.        Behavior: Behavior normal.        Judgment: Judgment normal.      LABORATORY DATA:  I have reviewed the data as listed Lab Results  Component Value Date   WBC 7.1 05/03/2023   HGB 12.8 05/03/2023   HCT 37.9 05/03/2023   MCV 92.2 05/03/2023   PLT 146 (L) 05/03/2023   Recent Labs    11/26/22 0928 01/06/23 0932 05/03/23 1315  NA 137 135 137  K 3.5 3.7 3.9  CL 100 102 105  CO2 25 26 26   GLUCOSE 180* 158* 91  BUN 12 9 9   CREATININE 0.75 0.71 0.67  CALCIUM 9.0 8.8* 8.7*  GFRNONAA >60 >60 >60  PROT 7.4 7.1 7.1  ALBUMIN 3.9 3.8 3.8  AST 31 33 36  ALT 20 19 17   ALKPHOS 65 61 58  BILITOT 0.4 0.4 0.3    RADIOGRAPHIC STUDIES: I have personally reviewed the radiological images as listed and agreed with the findings in the report. CT Chest W Contrast  Result Date: 05/02/2023 CLINICAL DATA:  Non-small-cell lung cancer. Status post right upper lobectomy. Restaging. * Tracking Code: BO * EXAM: CT CHEST WITH CONTRAST TECHNIQUE: Multidetector CT imaging of the chest was performed during intravenous contrast administration. RADIATION DOSE REDUCTION: This exam was performed according to the departmental dose-optimization program which includes automated exposure control, adjustment of the mA and/or kV according to patient size and/or use of iterative reconstruction technique. CONTRAST:  75mL OMNIPAQUE IOHEXOL 300 MG/ML  SOLN COMPARISON:  01/03/2023 FINDINGS: Cardiovascular: The heart size is normal. No  substantial pericardial effusion. Coronary artery calcification is evident. Mild atherosclerotic calcification is noted in the wall of the thoracic aorta. Right Port-A-Cath tip is positioned at the SVC/RA junction. Mediastinum/Nodes: No mediastinal lymphadenopathy. There is no hilar lymphadenopathy. The esophagus has normal imaging features. There is no axillary lymphadenopathy. Lungs/Pleura: Volume loss in the right hemithorax compatible with the reported history of prior right upper lobectomy. Scattered areas of subpleural and peripheral parenchymal scarring in the right lung are stable. Area of scarring in the posterior right lower lobe on image 49/3 is similar in size to the prior exam but 4 mm nodular component on 49/3 has become more confluent in the interval peripheral tree-in-bud nodularity in the lower right lung suggests sequelae of  atypical infection no new overtly suspicious pulmonary nodule or mass. No focal airspace consolidation. No pleural effusion. Upper Abdomen: Nodular liver contour suggests cirrhosis. No acute findings in the visualized upper abdomen. Musculoskeletal: No worrisome lytic or sclerotic osseous abnormality. IMPRESSION: 1. Stable exam. No new or progressive findings to suggest recurrent or metastatic disease. 2. Area of scarring in the posterior right lower lobe is similar in size to the prior exam but 4 mm nodular component has become more confluent in the interval. Attention on follow-up recommended. 3. Peripheral tree-in-bud nodularity in the lower right lung suggests sequelae of atypical infection. Stable. 4. Nodular liver contour suggests cirrhosis. 5.  Aortic Atherosclerosis (ICD10-I70.0). Electronically Signed   By: Kennith Center M.D.   On: 05/02/2023 09:31    ASSESSMENT & PLAN:   Malignant neoplasm of upper lobe of right lung (HCC) # pT3N0 [4.5cm; Squamous cell ca- + involvement of the parietal pleura]; stage II. Currently s/p adjuvant carbo-taxol x4 [poor candidate for  cisplatin].  PD-L1-5%; Currently  s/p  adjuvant Atezolizumab x 13 cycles; JULY 2024- Stable exam. No new or progressive findings to suggest recurrent or metastatic disease; - 4mm nodular Right upper posterior- will repeat CT scan in 3 months. Ordered today.   # Hypokalemia: Off hydrochlorothiazide - On Kdur BID- 3.5stable.   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 continue-vit D 50,000-stable.   # chronic back pain/chronic left LE/Bil PN-history of cervical spine injury-no evidence of metastatic disease.  Continue hydrocodone [for now as per Dr. Judithann Sheen. stable.   # Iatrogenic hypo-hyperthyroidism- [AUG 2023]-TSH-100 mcg/day-; JAN 2024-TSH- LOW DEC 2023- synthoird- 100 mg/day; monitor for now. stable. Will monitor for now.  TSH slowly improving. stable.    # History of diabetes-OFF metformin [sec to diarrhea] [facility] 2023-Hb A1c-Hb A1C-5.9]. on Monjauro-   #Incidental findings on Imaging  CT, APRIL 2024-  Liver contour appears nodular, raising concern for cirrhosis;  Aortic Atherosclerosis (ICD10-I70.0).I reviewed/discussed/counseled the patient.   # DISPOSITION:  # follow up in  midNOV - MD: port flush-labs- cbc/cmp;tyroid panel-  1 week prior-CT chest prior- Dr.B    All questions were answered. The patient knows to call the clinic with any problems, questions or concerns.    Earna Coder, MD 05/03/2023 2:31 PM

## 2023-05-03 NOTE — Assessment & Plan Note (Addendum)
#   pT3N0 [4.5cm; Squamous cell ca- + involvement of the parietal pleura]; stage II. Currently s/p adjuvant carbo-taxol x4 [poor candidate for cisplatin].  PD-L1-5%; Currently  s/p  adjuvant Atezolizumab x 13 cycles; JULY 2024- Stable exam. No new or progressive findings to suggest recurrent or metastatic disease; - 4mm nodular Right upper posterior- will repeat CT scan in 3 months. Ordered today.   # Hypokalemia: Off hydrochlorothiazide - On Kdur BID- 3.5stable.   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 continue-vit D 50,000-stable.   # chronic back pain/chronic left LE/Bil PN-history of cervical spine injury-no evidence of metastatic disease.  Continue hydrocodone [for now as per Dr. Judithann Sheen. stable.   # Iatrogenic hypo-hyperthyroidism- [AUG 2023]-TSH-100 mcg/day-; JAN 2024-TSH- LOW DEC 2023- synthoird- 100 mg/day; monitor for now. stable. Will monitor for now.  TSH slowly improving. stable.    # History of diabetes-OFF metformin [sec to diarrhea] [facility] 2023-Hb A1c-Hb A1C-5.9]. on Monjauro-   #Incidental findings on Imaging  CT, APRIL 2024-  Liver contour appears nodular, raising concern for cirrhosis;  Aortic Atherosclerosis (ICD10-I70.0).I reviewed/discussed/counseled the patient.   # DISPOSITION:  # follow up in  midNOV - MD: port flush-labs- cbc/cmp;tyroid panel-  1 week prior-CT chest prior- Dr.B

## 2023-05-04 ENCOUNTER — Other Ambulatory Visit: Payer: Self-pay

## 2023-05-17 ENCOUNTER — Other Ambulatory Visit: Payer: Self-pay

## 2023-06-07 DIAGNOSIS — R110 Nausea: Secondary | ICD-10-CM | POA: Diagnosis not present

## 2023-06-07 DIAGNOSIS — Z1211 Encounter for screening for malignant neoplasm of colon: Secondary | ICD-10-CM | POA: Diagnosis not present

## 2023-06-07 DIAGNOSIS — R197 Diarrhea, unspecified: Secondary | ICD-10-CM | POA: Diagnosis not present

## 2023-06-07 DIAGNOSIS — E782 Mixed hyperlipidemia: Secondary | ICD-10-CM | POA: Diagnosis not present

## 2023-06-07 DIAGNOSIS — J431 Panlobular emphysema: Secondary | ICD-10-CM | POA: Diagnosis not present

## 2023-06-07 DIAGNOSIS — Z79899 Other long term (current) drug therapy: Secondary | ICD-10-CM | POA: Diagnosis not present

## 2023-06-07 DIAGNOSIS — E119 Type 2 diabetes mellitus without complications: Secondary | ICD-10-CM | POA: Diagnosis not present

## 2023-06-07 DIAGNOSIS — E039 Hypothyroidism, unspecified: Secondary | ICD-10-CM | POA: Diagnosis not present

## 2023-06-07 DIAGNOSIS — I10 Essential (primary) hypertension: Secondary | ICD-10-CM | POA: Diagnosis not present

## 2023-06-09 ENCOUNTER — Other Ambulatory Visit: Payer: Self-pay | Admitting: Internal Medicine

## 2023-06-09 DIAGNOSIS — R197 Diarrhea, unspecified: Secondary | ICD-10-CM

## 2023-06-09 DIAGNOSIS — R110 Nausea: Secondary | ICD-10-CM

## 2023-06-16 ENCOUNTER — Other Ambulatory Visit: Payer: Medicare HMO

## 2023-07-07 ENCOUNTER — Other Ambulatory Visit: Payer: Medicare HMO

## 2023-08-08 ENCOUNTER — Other Ambulatory Visit: Payer: Medicare HMO

## 2023-08-15 ENCOUNTER — Telehealth: Payer: Self-pay | Admitting: Internal Medicine

## 2023-08-15 ENCOUNTER — Ambulatory Visit: Admission: RE | Admit: 2023-08-15 | Payer: Medicare HMO | Source: Ambulatory Visit

## 2023-08-15 NOTE — Telephone Encounter (Signed)
Pt called to cancel port lab/MD appts because she canceled her CT appt.  Pt mentioned not having bottom teeth and going to the dentist and stated if the provider wanted to r/s her CT (only at [facility]) they can and she will see it all in mychart.

## 2023-08-19 ENCOUNTER — Other Ambulatory Visit: Payer: Self-pay

## 2023-08-22 ENCOUNTER — Ambulatory Visit: Payer: Medicare HMO | Admitting: Internal Medicine

## 2023-08-22 ENCOUNTER — Other Ambulatory Visit: Payer: Medicare HMO

## 2023-08-23 ENCOUNTER — Ambulatory Visit
Admission: RE | Admit: 2023-08-23 | Discharge: 2023-08-23 | Disposition: A | Discharge status: Home / Self Care | Payer: Medicare HMO | Source: Ambulatory Visit | Attending: Internal Medicine | Admitting: Internal Medicine

## 2023-08-23 DIAGNOSIS — C3411 Malignant neoplasm of upper lobe, right bronchus or lung: Secondary | ICD-10-CM | POA: Diagnosis not present

## 2023-08-23 DIAGNOSIS — J432 Centrilobular emphysema: Secondary | ICD-10-CM | POA: Diagnosis not present

## 2023-08-23 DIAGNOSIS — I700 Atherosclerosis of aorta: Secondary | ICD-10-CM | POA: Diagnosis not present

## 2023-08-23 DIAGNOSIS — C3490 Malignant neoplasm of unspecified part of unspecified bronchus or lung: Secondary | ICD-10-CM | POA: Diagnosis not present

## 2023-08-23 MED ORDER — IOHEXOL 300 MG/ML  SOLN
75.0000 mL | Freq: Once | INTRAMUSCULAR | Status: AC | PRN
  Administered 2023-08-23: 75 mL via INTRAVENOUS

## 2023-09-05 ENCOUNTER — Inpatient Hospital Stay: Payer: Medicare HMO | Admitting: Internal Medicine

## 2023-09-05 ENCOUNTER — Encounter: Payer: Self-pay | Admitting: Internal Medicine

## 2023-09-05 ENCOUNTER — Inpatient Hospital Stay: Payer: Medicare HMO | Attending: Internal Medicine

## 2023-09-05 VITALS — BP 113/60 | HR 83 | Resp 18 | Wt 162.3 lb

## 2023-09-05 DIAGNOSIS — Z85118 Personal history of other malignant neoplasm of bronchus and lung: Secondary | ICD-10-CM | POA: Insufficient documentation

## 2023-09-05 DIAGNOSIS — Z79899 Other long term (current) drug therapy: Secondary | ICD-10-CM | POA: Diagnosis not present

## 2023-09-05 DIAGNOSIS — C3411 Malignant neoplasm of upper lobe, right bronchus or lung: Secondary | ICD-10-CM | POA: Diagnosis not present

## 2023-09-05 DIAGNOSIS — E876 Hypokalemia: Secondary | ICD-10-CM | POA: Diagnosis not present

## 2023-09-05 DIAGNOSIS — Z08 Encounter for follow-up examination after completed treatment for malignant neoplasm: Secondary | ICD-10-CM | POA: Insufficient documentation

## 2023-09-05 DIAGNOSIS — Z452 Encounter for adjustment and management of vascular access device: Secondary | ICD-10-CM | POA: Diagnosis not present

## 2023-09-05 DIAGNOSIS — G620 Drug-induced polyneuropathy: Secondary | ICD-10-CM | POA: Diagnosis not present

## 2023-09-05 DIAGNOSIS — Z95828 Presence of other vascular implants and grafts: Secondary | ICD-10-CM

## 2023-09-05 DIAGNOSIS — M549 Dorsalgia, unspecified: Secondary | ICD-10-CM | POA: Insufficient documentation

## 2023-09-05 DIAGNOSIS — G8929 Other chronic pain: Secondary | ICD-10-CM | POA: Diagnosis not present

## 2023-09-05 DIAGNOSIS — E119 Type 2 diabetes mellitus without complications: Secondary | ICD-10-CM | POA: Diagnosis not present

## 2023-09-05 LAB — CMP (CANCER CENTER ONLY)
ALT: 16 U/L (ref 0–44)
AST: 31 U/L (ref 15–41)
Albumin: 3.6 g/dL (ref 3.5–5.0)
Alkaline Phosphatase: 49 U/L (ref 38–126)
Anion gap: 10 (ref 5–15)
BUN: 10 mg/dL (ref 8–23)
CO2: 28 mmol/L (ref 22–32)
Calcium: 8.7 mg/dL — ABNORMAL LOW (ref 8.9–10.3)
Chloride: 103 mmol/L (ref 98–111)
Creatinine: 0.68 mg/dL (ref 0.44–1.00)
GFR, Estimated: >60 mL/min (ref >60)
Glucose, Bld: 105 mg/dL — ABNORMAL HIGH (ref 70–99)
Potassium: 3.9 mmol/L (ref 3.5–5.1)
Sodium: 141 mmol/L (ref 135–145)
Total Bilirubin: 0.6 mg/dL (ref <1.2)
Total Protein: 6.7 g/dL (ref 6.5–8.1)

## 2023-09-05 LAB — CBC WITH DIFFERENTIAL (CANCER CENTER ONLY)
Abs Immature Granulocytes: 0.01 10*3/uL (ref 0.00–0.07)
Basophils Absolute: 0.1 10*3/uL (ref 0.0–0.1)
Basophils Relative: 1 %
Eosinophils Absolute: 0.3 10*3/uL (ref 0.0–0.5)
Eosinophils Relative: 5 %
HCT: 36.2 % (ref 36.0–46.0)
Hemoglobin: 12.4 g/dL (ref 12.0–15.0)
Immature Granulocytes: 0 %
Lymphocytes Relative: 39 %
Lymphs Abs: 2.3 10*3/uL (ref 0.7–4.0)
MCH: 31.7 pg (ref 26.0–34.0)
MCHC: 34.3 g/dL (ref 30.0–36.0)
MCV: 92.6 fL (ref 80.0–100.0)
Monocytes Absolute: 0.5 10*3/uL (ref 0.1–1.0)
Monocytes Relative: 9 %
Neutro Abs: 2.7 10*3/uL (ref 1.7–7.7)
Neutrophils Relative %: 46 %
Platelet Count: 144 10*3/uL — ABNORMAL LOW (ref 150–400)
RBC: 3.91 MIL/uL (ref 3.87–5.11)
RDW: 11.2 % — ABNORMAL LOW (ref 11.5–15.5)
WBC Count: 5.8 10*3/uL (ref 4.0–10.5)
nRBC: 0 % (ref 0.0–0.2)

## 2023-09-05 MED ORDER — SODIUM CHLORIDE 0.9% FLUSH
10.0000 mL | INTRAVENOUS | Status: DC | PRN
  Administered 2023-09-05: 10 mL via INTRAVENOUS
  Filled 2023-09-05: qty 10

## 2023-09-05 MED ORDER — HEPARIN SOD (PORK) LOCK FLUSH 100 UNIT/ML IV SOLN
500.0000 [IU] | Freq: Once | INTRAVENOUS | Status: AC
  Administered 2023-09-05: 500 [IU] via INTRAVENOUS
  Filled 2023-09-05: qty 5

## 2023-09-05 NOTE — Progress Notes (Signed)
Back hurts all the time since 2009. 2 months now her lungs are clear, sleeping well. Denies cough or dyspnea. Appetite is diminished due to mounjuro injections for diabetes. She has lost weight on the medications.

## 2023-09-05 NOTE — Progress Notes (Signed)
Survivorship Care Plan visit completed.  Treatment summary reviewed and given to patient.  ASCO answers booklet reviewed and given to patient.  CARE program and Cancer Transitions discussed with patient along with other resources cancer center offers to patients and caregivers.  Patient verbalized understanding.    

## 2023-09-05 NOTE — Progress Notes (Signed)
[facility] CONSULT NOTE  Patient Care Team: Marguarite Arbour, MD as PCP - General (Internal Medicine) Glory Buff, RN as Oncology Nurse Navigator Earna Coder, MD as Consulting Physician (Oncology)  CHIEF COMPLAINTS/PURPOSE OF CONSULTATION: lung cancer  #  Oncology History Overview Note  # RUL-squamous cell carcinoma [s/p bronchoscopy;Dr.A] 1. Spiculated mass of the posterior right pulmonary apex measuring 3.2 x 2.8 cm, consistent with primary lung malignancy. 2. Enlarged right hilar and pretracheal lymph nodes measuring up to 1.6 x 1.5 cm, suspicious for nodal metastatic disease. 3. Recommend multidisciplinary thoracic referral for consideration of PET-CT metabolic characterization and tissue sampling. 4. Background of very fine centrilobular nodularity throughout the lungs, most concentrated at the apices. Scattered bronchiolar plugging. Findings are most consistent with smoking-related respiratory bronchiolitis. 5. Coarse, nodular contour of the liver in the included upper abdomen, suggestive of cirrhosis. 6. Coronary artery disease.  # COPD-  # DM- diet controlled; chronic back pain- hydrocodone 5 day; chronic left lower extremity weakness.  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 Currently on Synthroid 150 mcg/day-  # NGS/MOLECULAR TESTS:    # PALLIATIVE CARE EVALUATION:  # PAIN MANAGEMENT:    DIAGNOSIS:   STAGE:         ;  GOALS:  CURRENT/MOST RECENT THERAPY :    A. LYMPH NODE, HILAR, EXCISION:  - Lymph node negative for metastatic carcinoma.   B. LYMPH NODE, LEVEL 4, EXCISION:  - Lymph node negative for metastatic carcinoma.   C. LUNG, RIGHT UPPER LOBE, LOBECTOMY:  - Squamous cell carcinoma, 4.5 cm.  - Carcinoma extends through visceral pleura and focally involves  parietal pleura.  -  Surgical margins negative for carcinoma.  - One lymph node negative for carcinoma.  - See oncology table and comment.   D. SOFT TISSUE, CHEST WALL, BIOPSY:  - Fibroadipose tissue with focal inflammation and fibrosis.  - No carcinoma identified.   ONCOLOGY TABLE:  LUNG: Resection  Synchronous Tumors: Not applicable.  Total Number of Primary Tumors: 1  Procedure: Right upper lobectomy with lymph node biopsies and chest wall  biopsy.  Specimen Laterality: Right upper lobe.  Tumor Focality: Unifocal.  Tumor Site: Right upper lobe.  Tumor Size: 4.5 x 4 x 3 cm.  Histologic Type: Squamous cell carcinoma.  Visceral Pleura Invasion: Present.  Direct Invasion of Adjacent Structures: Focal involvement of parietal  pleura.  Lymphovascular Invasion: Not identified.  Margins: All surgical margins negative for carcinoma.  Regional Lymph Nodes:       Number of Lymph Nodes Involved: 0                            Nodal Sites with Tumor: 0       Number of Lymph Nodes Examined: 3                       Nodal Sites Examined: 2  Pathologic Stage Classification (pTNM, AJCC 8th Edition): pT3, pN0  Ancillary Studies: Can be performed if requested.   #Right upper lobe stage IIb-pT3 [not cisplatin candidate]; CarboTaxol x4. April 11th, 2023-Atezo q 3 w x1 year  # AUG 2023- IATROGENIC HYPOTHYRODISM- TSH 100; START SYNTHROID   Malignant neoplasm of upper lobe of right lung (HCC)  04/24/2021 Initial Diagnosis   Malignant neoplasm of  upper lobe of right lung (HCC)   05/02/2021 Cancer Staging   Staging form: Lung, AJCC 8th Edition - Clinical: Stage IB (cT2a, cN0, cM0) - Signed by Earna Coder, MD on 05/02/2021 Stage prefix: Initial diagnosis   07/23/2021 Cancer Staging   Staging form: Lung, AJCC 8th Edition - Pathologic: Stage IIB (pT3, pN0, cM0) - Signed by Earna Coder, MD on 07/23/2021 Stage prefix: Initial diagnosis    HISTORY OF PRESENTING ILLNESS:  Patient is alone.  in a wheel  chair.   Olivia Buchanan 72 y.o.  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  Chronic back hurts all the time since 2009. 2 months now her lungs are clear, sleeping well. Denies cough or dyspnea. Appetite is diminished due to mounjuro injections for diabetes. She has lost weight on the medication   Patient noted to have weight loss on monjauro.   Review of Systems  Constitutional:  Positive for malaise/fatigue and weight loss. Negative for chills, diaphoresis and fever.  HENT:  Negative for nosebleeds and sore throat.   Eyes:  Negative for double vision.  Respiratory:  Positive for cough and shortness of breath. Negative for hemoptysis and wheezing.   Cardiovascular:  Negative for chest pain, palpitations, orthopnea and leg swelling.  Gastrointestinal:  Negative for abdominal pain, blood in stool, constipation, diarrhea, heartburn, melena and vomiting.  Genitourinary:  Negative for dysuria, frequency and urgency.  Musculoskeletal:  Positive for back pain, joint pain and neck pain.  Skin: Negative.  Negative for itching and rash.  Neurological:  Negative for tingling, focal weakness and weakness.  Endo/Heme/Allergies:  Does not bruise/bleed easily.  Psychiatric/Behavioral:  Negative for depression. The patient is not nervous/anxious and does not have insomnia.      MEDICAL HISTORY:  Past Medical History:  Diagnosis Date   Angioedema 12/08/2019   Anxiety    Aortic atherosclerosis (HCC)    Asthma    CAD (coronary artery disease)    3 vessel   Cancer (HCC)    COPD (chronic obstructive pulmonary disease) (HCC) 12/08/2019   DDD (degenerative disc disease), lumbar    Depression    Diabetes mellitus without complication (HCC)    no meds, diet controlled   Dyspnea    GERD (gastroesophageal reflux disease)    HTN (hypertension) 12/08/2019   Hyperlipidemia    Hypertension    Mass of upper  lobe of right lung 04/02/2021   spiculated mass RIGHT posterior pulmonary apex with associated hilar and pretracheal LAD; measured 3.2 x 2.8 cm   Obesity (BMI 30-39.9)    T2DM (type 2 diabetes mellitus) (HCC) 12/08/2019    SURGICAL HISTORY: Past Surgical History:  Procedure Laterality Date   BACK SURGERY     lumbar L5-S1 ruptured disc x 3 surgeries   CERVICAL FUSION  2009   FINE NEEDLE ASPIRATION  06/23/2021   Procedure: FINE NEEDLE ASPIRATION (FNA) LINEAR;  Surgeon: Josephine Igo, DO;  Location: [facility];  Service: Pulmonary;;   INTERCOSTAL NERVE BLOCK Right 07/09/2021   Procedure: INTERCOSTAL NERVE BLOCK;  Surgeon: Corliss Skains, MD;  Location: [facility] OR;  Service: Thoracic;  Laterality: Right;   IR IMAGING GUIDED PORT INSERTION  08/06/2021   LAPAROSCOPIC TOTAL HYSTERECTOMY  1998   with oophorectomy   LYMPH NODE DISSECTION Right 07/09/2021   Procedure: LYMPH NODE DISSECTION;  Surgeon: Corliss Skains, MD;  Location: [facility] OR;  Service: Thoracic;  Laterality: Right;   TONSILLECTOMY     VIDEO BRONCHOSCOPY WITH ENDOBRONCHIAL NAVIGATION N/A 04/15/2021   Procedure: VIDEO BRONCHOSCOPY WITH ENDOBRONCHIAL NAVIGATION;  Surgeon: Vida Rigger, MD;  Location: [facility] ORS;  Service: Thoracic;  Laterality: N/A;   VIDEO BRONCHOSCOPY WITH ENDOBRONCHIAL ULTRASOUND N/A 04/15/2021   Procedure: VIDEO BRONCHOSCOPY WITH ENDOBRONCHIAL ULTRASOUND;  Surgeon: Vida Rigger, MD;  Location: [facility] ORS;  Service: Thoracic;  Laterality: N/A;   VIDEO BRONCHOSCOPY WITH ENDOBRONCHIAL ULTRASOUND N/A 06/23/2021   Procedure: VIDEO BRONCHOSCOPY WITH ENDOBRONCHIAL ULTRASOUND;  Surgeon: Josephine Igo, DO;  Location: [facility];  Service: Pulmonary;  Laterality: N/A;    SOCIAL HISTORY: Social History   Socioeconomic History   Marital status: Married    Spouse name: Dwight   Number of children: 3   Years of education: Not on file   Highest education level: Not on file  Occupational History   Not on file   Tobacco Use   Smoking status: Former    Current packs/day: 0.00    Average packs/day: 3.0 packs/day for 50.0 years (150.0 ttl pk-yrs)    Types: Cigarettes    Start date: 24    Quit date: 2019    Years since quitting: 5.9   Smokeless tobacco: Never  Vaping Use   Vaping status: Never Used  Substance and Sexual Activity   Alcohol use: Not Currently   Drug use: Never   Sexual activity: Not on file    Comment: Hysterectomy  Other Topics Concern   Not on file  Social History Narrative   Lives in home; with husband; [place]; never smoking 2019; no alcohol. 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    Social Determinants of Health   Financial Resource Strain: Low Risk  (12/03/2022)   Received from [facility], [facility]   Overall Financial Resource Strain (CARDIA)    Difficulty of Paying Living Expenses: Not hard at all  Food Insecurity: No Food Insecurity (12/03/2022)   Received from [facility], [facility]   Hunger Vital Sign    Worried About Running Out of Food in the Last Year: Never true    Ran Out of Food in the Last Year: Never true  Transportation Needs: No Transportation Needs (12/03/2022)   Received from [facility], [facility]   [facility] - Transportation    In the past 12 months, has lack of transportation kept you from medical appointments or from getting medications?: No    Lack of Transportation (Non-Medical): No  Physical Activity: Not on file  Stress: Not on file  Social Connections: Not on file  Intimate Partner Violence: Not on file    FAMILY HISTORY: Family History  Problem Relation Age of Onset   Bone cancer Maternal Uncle     ALLERGIES:  is allergic to ibuprofen and macrobid [nitrofurantoin].  MEDICATIONS:  Current Outpatient Medications  Medication Sig Dispense Refill   albuterol (VENTOLIN HFA) 108 (90 Base)  MCG/ACT inhaler Inhale 1 puff into the lungs every 6 (six) hours as needed for wheezing.     aspirin EC 81 MG tablet Take 81 mg by mouth at bedtime.     clonazePAM (KLONOPIN) 0.5 MG tablet Take 0.5 mg by mouth in the morning, at noon, and at bedtime.     diclofenac Sodium (VOLTAREN ARTHRITIS PAIN) 1 % GEL Apply topically 4 (four) times daily.     fluticasone-salmeterol (ADVAIR) 250-50 MCG/ACT AEPB Inhale 1 puff into  the lungs in the morning and at bedtime.     gabapentin (NEURONTIN) 300 MG capsule Take 300 mg by mouth 3 (three) times daily. Take 300 mg qam and lunch and take 600 mg qhs     hydrochlorothiazide (HYDRODIURIL) 25 MG tablet Take 25 mg by mouth every other day.     HYDROcodone-acetaminophen (NORCO) 10-325 MG tablet Take 1 tablet by mouth every 6 (six) hours as needed.     ipratropium-albuterol (DUONEB) 0.5-2.5 (3) MG/3ML SOLN Take 3 mLs by nebulization 3 (three) times daily.     levothyroxine (SYNTHROID) 125 MCG tablet TAKE 1 TABLET(125 MCG) BY MOUTH DAILY BEFORE BREAKFAST 90 tablet 1   Melatonin 10 MG TABS Take 10 mg by mouth at bedtime.     MOUNJARO 2.5 MG/0.5ML Pen Inject 2.5 mg into the skin once a week.     Multiple Vitamin (MULTI-VITAMIN) tablet Take 1 tablet by mouth at bedtime.     nystatin cream (MYCOSTATIN) Apply 1 application topically 2 (two) times daily as needed for dry skin (around lips).     pantoprazole (PROTONIX) 40 MG tablet Take 40 mg by mouth at bedtime.     potassium chloride SA (KLOR-CON) 20 MEQ tablet Take 20 mEq by mouth 2 (two) times daily.     pravastatin (PRAVACHOL) 80 MG tablet Take 80 mg by mouth at bedtime.     tiZANidine (ZANAFLEX) 4 MG tablet Take 4 mg by mouth 3 (three) times daily.     traZODone (DESYREL) 100 MG tablet Take 50 mg by mouth at bedtime.     venlafaxine XR (EFFEXOR-XR) 75 MG 24 hr capsule Take 150 mg by mouth at bedtime.     Vitamin D, Ergocalciferol, (DRISDOL) 1.25 MG (50000 UNIT) CAPS capsule TAKE 1 CAPSULE BY MOUTH 1 TIME A WEEK 12  capsule 1   diphenoxylate-atropine (LOMOTIL) 2.5-0.025 MG tablet Take by mouth. (Patient not taking: Reported on 09/05/2023)     EPINEPHrine 0.3 mg/0.3 mL IJ SOAJ injection Inject 0.3 mLs (0.3 mg total) into the muscle as needed for anaphylaxis. (Patient not taking: Reported on 09/05/2023) 2 each 0   lidocaine-prilocaine (EMLA) cream Use as directed prior to port being accessed 30 g 2   prochlorperazine (COMPAZINE) 10 MG tablet Take 1 tablet (10 mg total) by mouth every 6 (six) hours as needed (Nausea or vomiting). (Patient not taking: Reported on 09/05/2023) 30 tablet 1   No current facility-administered medications for this visit.   Facility-Administered Medications Ordered in Other Visits  Medication Dose Route Frequency Provider Last Rate Last Admin   heparin lock flush 100 UNIT/ML injection            sodium chloride flush (NS) 0.9 % injection 10 mL  10 mL Intravenous Once Louretta Shorten R, MD       sodium chloride flush (NS) 0.9 % injection 10 mL  10 mL Intravenous PRN Earna Coder, MD   10 mL at 09/05/23 1257      .  PHYSICAL EXAMINATION: ECOG PERFORMANCE STATUS: 1 - Symptomatic but completely ambulatory  Vitals:   09/05/23 1308  BP: 113/60  Pulse: 83  Resp: 18  SpO2: 97%     Filed Weights   09/05/23 1308  Weight: 162 lb 4.8 oz (73.6 kg)      Physical Exam Vitals and nursing note reviewed.  HENT:     Head: Normocephalic and atraumatic.     Mouth/Throat:     Mouth: Mucous membranes are moist.  Pharynx: Oropharynx is clear. No oropharyngeal exudate.  Eyes:     Extraocular Movements: Extraocular movements intact.     Pupils: Pupils are equal, round, and reactive to light.  Cardiovascular:     Rate and Rhythm: Normal rate and regular rhythm.  Pulmonary:     Effort: No respiratory distress.     Breath sounds: No wheezing.     Comments: Decreased breath sounds bilaterally at bases.  No wheeze or crackles Abdominal:     General: Bowel sounds are  normal. There is no distension.     Palpations: Abdomen is soft. There is no mass.     Tenderness: There is no abdominal tenderness. There is no guarding or rebound.  Musculoskeletal:        General: No tenderness. Normal range of motion.     Cervical back: Normal range of motion and neck supple.  Skin:    General: Skin is warm.  Neurological:     General: No focal deficit present.     Mental Status: She is alert and oriented to person, place, and time.     Comments: 4-5 strength in the left lower extremity/chronic.  Psychiatric:        Mood and Affect: Affect normal.        Behavior: Behavior normal.        Judgment: Judgment normal.      LABORATORY DATA:  I have reviewed the data as listed Lab Results  Component Value Date   WBC 5.8 09/05/2023   HGB 12.4 09/05/2023   HCT 36.2 09/05/2023   MCV 92.6 09/05/2023   PLT 144 (L) 09/05/2023   Recent Labs    01/06/23 0932 05/03/23 1315 09/05/23 1254  NA 135 137 141  K 3.7 3.9 3.9  CL 102 105 103  CO2 26 26 28   GLUCOSE 158* 91 105*  BUN 9 9 10   CREATININE 0.71 0.67 0.68  CALCIUM 8.8* 8.7* 8.7*  GFRNONAA >60 >60 >60  PROT 7.1 7.1 6.7  ALBUMIN 3.8 3.8 3.6  AST 33 36 31  ALT 19 17 16   ALKPHOS 61 58 49  BILITOT 0.4 0.3 0.6    RADIOGRAPHIC STUDIES: I have personally reviewed the radiological images as listed and agreed with the findings in the report. CT CHEST W CONTRAST  Result Date: 08/31/2023 CLINICAL DATA:  Non-small cell lung cancer. Chemotherapy and immunotherapy finished March 2024. * Tracking Code: BO * EXAM: CT CHEST WITH CONTRAST TECHNIQUE: Multidetector CT imaging of the chest was performed during intravenous contrast administration. RADIATION DOSE REDUCTION: This exam was performed according to the departmental dose-optimization program which includes automated exposure control, adjustment of the mA and/or kV according to patient size and/or use of iterative reconstruction technique. CONTRAST:  75mL OMNIPAQUE  IOHEXOL 300 MG/ML  SOLN COMPARISON:  04/25/2023. FINDINGS: Cardiovascular: Right IJ Port-A-Cath terminates in the SVC. Atherosclerotic calcification of the aorta, aortic valve and coronary arteries. Enlarged pulmonic trunk and heart. No pericardial effusion. Mediastinum/Nodes: Mediastinal lymph nodes measure up to 11 mm in the AP window (2/43), decreased in size from 12 mm on 04/25/2023. Bihilar lymphoid tissue without pathologically enlarged lymph nodes. No axillary adenopathy. Esophagus is grossly unremarkable. Lungs/Pleura: Centrilobular and paraseptal emphysema. Right upper lobectomy with pleuroparenchymal scarring in the right hemithorax, including in the subpleural aspect of the superior segment right lower lobe (3/43). Mild basilar coarsened subpleural ground-glass and bronchiolectasis, right greater than left. Focal ground-glass in medial right lower lobe (3/71), new in the interval from 04/25/2023, favoring an  infectious/inflammatory etiology. No pleural fluid. Airway is otherwise unremarkable. Upper Abdomen: Visualized portions of the liver, adrenal glands, kidneys, spleen, pancreas, stomach and bowel are grossly unremarkable. No upper abdominal adenopathy. Musculoskeletal: Degenerative changes in the spine. No worrisome lytic or sclerotic lesions. IMPRESSION: 1. Right upper lobectomy with pleuroparenchymal scarring in the right hemithorax. AP window lymph node has decreased slightly in size in the interval. 2. 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 3. Aortic atherosclerosis (ICD10-I70.0). Coronary artery calcification. 4. Enlarged pulmonic trunk, indicative of pulmonary arterial hypertension. 5.  Emphysema (ICD10-J43.9). Electronically Signed   By: Leanna Battles M.D.   On: 08/31/2023 14:06    ASSESSMENT & PLAN:   Malignant neoplasm of upper lobe of right lung (HCC) # pT3N0 [4.5cm; Squamous cell ca- + involvement of the parietal pleura]; stage II.  Currently s/p adjuvant carbo-taxol x4 [poor candidate for cisplatin].  PD-L1-5%; Currently  s/p  adjuvant Atezolizumab x 13 cycles; NOV 11th, 2024- Chest-contrast-  Right upper lobectomy with pleuroparenchymal scarring in the right hemithorax. AP window lymph node has decreased slightly in size in the interval. 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 Will plan CT scan in 6 months; will order CT scna at next visit.   # Hypokalemia: Off hydrochlorothiazide - On Kdur BID- 3.5-  stable.  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 continue-vit D 50,000- stable.  # chronic back pain/chronic left LE/Bil PN-history of cervical spine injury-no evidence of metastatic disease.  Continue hydrocodone [for now as per Dr. Judithann Sheen.  stable.  # Iatrogenic hypo-hyperthyroidism- [AUG 2023]-TSH-100 mcg/day-; JAN 2024-TSH- LOW DEC 2023- synthoird- 100 mg/day; monitor for now. stable. Will monitor for now.  TSH slowly improving. stable.    # History of diabetes-OFF metformin [sec to diarrhea] [facility] 2023-Hb A1c-Hb A1C-5.9]. on Monjauro/ weight loss-  stable.   #Incidental findings on Imaging  CT, NOV 2024-  Liver contour appears nodular, raising concern for cirrhosis;  Aortic Atherosclerosis .I reviewed/discussed/counseled the patient.   # IV access: Mediport- functional.   # DISPOSITION:  # follow up in  3 months-  MD: port flush-labs- cbc/cmp;thyroid panel-Dr.B  # I reviewed the blood work- with the patient in detail; also reviewed the imaging independently [as summarized above]; and with the patient in detail.     All questions were answered. The patient knows to call the clinic with any problems, questions or concerns.    Earna Coder, MD 09/05/2023 1:29 PM

## 2023-09-05 NOTE — Assessment & Plan Note (Addendum)
#   pT3N0 [4.5cm; Squamous cell ca- + involvement of the parietal pleura]; stage II. Currently s/p adjuvant carbo-taxol x4 [poor candidate for cisplatin].  PD-L1-5%; Currently  s/p  adjuvant Atezolizumab x 13 cycles; NOV 11th, 2024- Chest-contrast-  Right upper lobectomy with pleuroparenchymal scarring in the right hemithorax. AP window lymph node has decreased slightly in size in the interval. 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 Will plan CT scan in 6 months; will order CT scna at next visit.   # Hypokalemia: Off hydrochlorothiazide - On Kdur BID- 3.5-  stable.  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 continue-vit D 50,000- stable.  # chronic back pain/chronic left LE/Bil PN-history of cervical spine injury-no evidence of metastatic disease.  Continue hydrocodone [for now as per Dr. Judithann Sheen.  stable.  # Iatrogenic hypo-hyperthyroidism- [AUG 2023]-TSH-100 mcg/day-; JAN 2024-TSH- LOW DEC 2023- synthoird- 100 mg/day; monitor for now. stable. Will monitor for now.  TSH slowly improving. stable.    # History of diabetes-OFF metformin [sec to diarrhea] [facility] 2023-Hb A1c-Hb A1C-5.9]. on Monjauro/ weight loss-  stable.   #Incidental findings on Imaging  CT, NOV 2024-  Liver contour appears nodular, raising concern for cirrhosis;  Aortic Atherosclerosis .I reviewed/discussed/counseled the patient.   # IV access: Mediport- functional.   # DISPOSITION:  # follow up in  3 months-  MD: port flush-labs- cbc/cmp;thyroid panel-Dr.B  # I reviewed the blood work- with the patient in detail; also reviewed the imaging independently [as summarized above]; and with the patient in detail.

## 2023-09-06 ENCOUNTER — Other Ambulatory Visit: Payer: Self-pay

## 2023-09-06 LAB — THYROID PANEL WITH TSH
Free Thyroxine Index: 3.1 (ref 1.2–4.9)
T3 Uptake Ratio: 27 % (ref 24–39)
T4, Total: 11.4 ug/dL (ref 4.5–12.0)
TSH: 0.047 u[IU]/mL — ABNORMAL LOW (ref 0.450–4.500)

## 2023-09-08 DIAGNOSIS — E119 Type 2 diabetes mellitus without complications: Secondary | ICD-10-CM | POA: Diagnosis not present

## 2023-09-08 DIAGNOSIS — Z79899 Other long term (current) drug therapy: Secondary | ICD-10-CM | POA: Diagnosis not present

## 2023-09-08 DIAGNOSIS — I10 Essential (primary) hypertension: Secondary | ICD-10-CM | POA: Diagnosis not present

## 2023-09-08 DIAGNOSIS — E039 Hypothyroidism, unspecified: Secondary | ICD-10-CM | POA: Diagnosis not present

## 2023-09-08 DIAGNOSIS — E782 Mixed hyperlipidemia: Secondary | ICD-10-CM | POA: Diagnosis not present

## 2023-11-15 ENCOUNTER — Other Ambulatory Visit: Payer: Self-pay | Admitting: Internal Medicine

## 2023-11-28 ENCOUNTER — Telehealth: Payer: Self-pay | Admitting: *Deleted

## 2023-11-28 NOTE — Telephone Encounter (Signed)
 She states that the scan just done 3 months  ago was good and he was going to make it 6 months, she wants to know if it is 3 month or 6 month  and also she will probably change the date of appts.

## 2023-12-13 ENCOUNTER — Other Ambulatory Visit: Payer: Self-pay

## 2023-12-13 ENCOUNTER — Inpatient Hospital Stay: Payer: Medicare HMO

## 2023-12-13 ENCOUNTER — Inpatient Hospital Stay: Payer: Medicare HMO | Admitting: Internal Medicine

## 2023-12-14 ENCOUNTER — Other Ambulatory Visit: Payer: Self-pay

## 2023-12-17 ENCOUNTER — Other Ambulatory Visit: Payer: Self-pay | Admitting: Internal Medicine

## 2023-12-17 DIAGNOSIS — D491 Neoplasm of unspecified behavior of respiratory system: Secondary | ICD-10-CM

## 2023-12-19 ENCOUNTER — Encounter: Payer: Self-pay | Admitting: Internal Medicine

## 2023-12-22 DIAGNOSIS — E039 Hypothyroidism, unspecified: Secondary | ICD-10-CM | POA: Diagnosis not present

## 2023-12-22 DIAGNOSIS — E118 Type 2 diabetes mellitus with unspecified complications: Secondary | ICD-10-CM | POA: Diagnosis not present

## 2023-12-22 DIAGNOSIS — Z79899 Other long term (current) drug therapy: Secondary | ICD-10-CM | POA: Diagnosis not present

## 2023-12-22 DIAGNOSIS — E782 Mixed hyperlipidemia: Secondary | ICD-10-CM | POA: Diagnosis not present

## 2023-12-28 ENCOUNTER — Inpatient Hospital Stay: Admitting: Internal Medicine

## 2023-12-28 ENCOUNTER — Inpatient Hospital Stay: Attending: Internal Medicine

## 2023-12-28 ENCOUNTER — Encounter: Payer: Self-pay | Admitting: Internal Medicine

## 2023-12-28 VITALS — BP 90/50 | HR 73 | Temp 98.3°F | Resp 17 | Wt 145.8 lb

## 2023-12-28 DIAGNOSIS — Z1231 Encounter for screening mammogram for malignant neoplasm of breast: Secondary | ICD-10-CM | POA: Diagnosis not present

## 2023-12-28 DIAGNOSIS — G8929 Other chronic pain: Secondary | ICD-10-CM | POA: Insufficient documentation

## 2023-12-28 DIAGNOSIS — E0590 Thyrotoxicosis, unspecified without thyrotoxic crisis or storm: Secondary | ICD-10-CM | POA: Diagnosis not present

## 2023-12-28 DIAGNOSIS — I10 Essential (primary) hypertension: Secondary | ICD-10-CM | POA: Diagnosis not present

## 2023-12-28 DIAGNOSIS — Z85118 Personal history of other malignant neoplasm of bronchus and lung: Secondary | ICD-10-CM | POA: Diagnosis not present

## 2023-12-28 DIAGNOSIS — C3411 Malignant neoplasm of upper lobe, right bronchus or lung: Secondary | ICD-10-CM

## 2023-12-28 DIAGNOSIS — Z452 Encounter for adjustment and management of vascular access device: Secondary | ICD-10-CM | POA: Diagnosis not present

## 2023-12-28 DIAGNOSIS — Z95828 Presence of other vascular implants and grafts: Secondary | ICD-10-CM

## 2023-12-28 DIAGNOSIS — G620 Drug-induced polyneuropathy: Secondary | ICD-10-CM | POA: Diagnosis not present

## 2023-12-28 DIAGNOSIS — M549 Dorsalgia, unspecified: Secondary | ICD-10-CM | POA: Insufficient documentation

## 2023-12-28 DIAGNOSIS — E876 Hypokalemia: Secondary | ICD-10-CM | POA: Diagnosis not present

## 2023-12-28 DIAGNOSIS — Z08 Encounter for follow-up examination after completed treatment for malignant neoplasm: Secondary | ICD-10-CM | POA: Diagnosis not present

## 2023-12-28 DIAGNOSIS — E785 Hyperlipidemia, unspecified: Secondary | ICD-10-CM | POA: Diagnosis not present

## 2023-12-28 DIAGNOSIS — Z0000 Encounter for general adult medical examination without abnormal findings: Secondary | ICD-10-CM | POA: Diagnosis not present

## 2023-12-28 DIAGNOSIS — J449 Chronic obstructive pulmonary disease, unspecified: Secondary | ICD-10-CM | POA: Diagnosis not present

## 2023-12-28 DIAGNOSIS — E119 Type 2 diabetes mellitus without complications: Secondary | ICD-10-CM | POA: Diagnosis not present

## 2023-12-28 DIAGNOSIS — E079 Disorder of thyroid, unspecified: Secondary | ICD-10-CM | POA: Diagnosis not present

## 2023-12-28 DIAGNOSIS — M25552 Pain in left hip: Secondary | ICD-10-CM | POA: Diagnosis not present

## 2023-12-28 LAB — CBC WITH DIFFERENTIAL (CANCER CENTER ONLY)
Abs Immature Granulocytes: 0.02 10*3/uL (ref 0.00–0.07)
Basophils Absolute: 0.1 10*3/uL (ref 0.0–0.1)
Basophils Relative: 1 %
Eosinophils Absolute: 0.2 10*3/uL (ref 0.0–0.5)
Eosinophils Relative: 3 %
HCT: 34.6 % — ABNORMAL LOW (ref 36.0–46.0)
Hemoglobin: 11.9 g/dL — ABNORMAL LOW (ref 12.0–15.0)
Immature Granulocytes: 0 %
Lymphocytes Relative: 37 %
Lymphs Abs: 2.6 10*3/uL (ref 0.7–4.0)
MCH: 31.3 pg (ref 26.0–34.0)
MCHC: 34.4 g/dL (ref 30.0–36.0)
MCV: 91.1 fL (ref 80.0–100.0)
Monocytes Absolute: 0.6 10*3/uL (ref 0.1–1.0)
Monocytes Relative: 8 %
Neutro Abs: 3.6 10*3/uL (ref 1.7–7.7)
Neutrophils Relative %: 51 %
Platelet Count: 148 10*3/uL — ABNORMAL LOW (ref 150–400)
RBC: 3.8 MIL/uL — ABNORMAL LOW (ref 3.87–5.11)
RDW: 11.8 % (ref 11.5–15.5)
WBC Count: 7 10*3/uL (ref 4.0–10.5)
nRBC: 0 % (ref 0.0–0.2)

## 2023-12-28 LAB — CMP (CANCER CENTER ONLY)
ALT: 13 U/L (ref 0–44)
AST: 25 U/L (ref 15–41)
Albumin: 3.7 g/dL (ref 3.5–5.0)
Alkaline Phosphatase: 55 U/L (ref 38–126)
Anion gap: 8 (ref 5–15)
BUN: 9 mg/dL (ref 8–23)
CO2: 26 mmol/L (ref 22–32)
Calcium: 8.9 mg/dL (ref 8.9–10.3)
Chloride: 105 mmol/L (ref 98–111)
Creatinine: 0.67 mg/dL (ref 0.44–1.00)
GFR, Estimated: >60 mL/min (ref >60)
Glucose, Bld: 87 mg/dL (ref 70–99)
Potassium: 4.1 mmol/L (ref 3.5–5.1)
Sodium: 139 mmol/L (ref 135–145)
Total Bilirubin: 0.7 mg/dL (ref 0.0–1.2)
Total Protein: 7.1 g/dL (ref 6.5–8.1)

## 2023-12-28 MED ORDER — SODIUM CHLORIDE 0.9% FLUSH
10.0000 mL | Freq: Once | INTRAVENOUS | Status: AC
  Administered 2023-12-28: 10 mL via INTRAVENOUS
  Filled 2023-12-28: qty 10

## 2023-12-28 MED ORDER — HEPARIN SOD (PORK) LOCK FLUSH 100 UNIT/ML IV SOLN
500.0000 [IU] | Freq: Once | INTRAVENOUS | Status: AC
  Administered 2023-12-28: 500 [IU] via INTRAVENOUS
  Filled 2023-12-28: qty 5

## 2023-12-28 NOTE — Progress Notes (Signed)
 Pt having knee pain and L hip pain (arthritis). She is in a wheelchair today. Breathing is good. Denies cough or dyspnea. New denture implants has impacted on her eating, she is also on mounjaro.

## 2023-12-28 NOTE — Assessment & Plan Note (Addendum)
#   pT3N0 [4.5cm; Squamous cell ca- + involvement of the parietal pleura]; stage II. Currently s/p adjuvant carbo-taxol x4 [poor candidate for cisplatin].  PD-L1-5%; Currently  s/p  adjuvant Atezolizumab x 13 cycles; NOV 11th, 2024- Chest-contrast-  Right upper lobectomy with pleuroparenchymal scarring in the right hemithorax. AP window lymph node has decreased slightly in size in the interval. 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 Will plan CT scan in 6 months; will order CT scan today-   # Hypokalemia: Off hydrochlorothiazide - On Kdur BID- 4.1- can take 1 pill a day-  stable.   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 continue-vit D 50,000- stable.  # chronic back pain/chronic left LE/Bil PN-history of cervical spine injury-no evidence of metastatic disease.  Continue hydrocodone [for now as per Dr. Judithann Sheen.  stable.  # Iatrogenic hypo-hyperthyroidism- [AUG 2023]-TSH-100 mcg/day-; JAN 2024-TSH- LOW DEC 2023- synthoird- 100 mg/day-  TSH slowly improving. stable.    # History of diabetes-OFF metformin [sec to diarrhea] [facility] 2023-Hb A1c-Hb A1C-5.9]. on Monjauro/ weight loss-  stable.   # IV access: Mediport- functional.   Chest-contrast-   # DISPOSITION:  # follow up in  3 months-  MD: port flush-labs- cbc/cmp;thyroid panel; CT chest prior-[facility]-Dr.B

## 2023-12-28 NOTE — Progress Notes (Signed)
 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 Spiculated mass of the posterior right pulmonary apex measuring 3.2 x 2.8 cm, consistent with primary lung malignancy. 2. Enlarged right hilar and pretracheal lymph nodes measuring up to 1.6 x 1.5 cm, suspicious for nodal metastatic disease. 3. Recommend multidisciplinary thoracic referral for consideration of PET-CT metabolic characterization and tissue sampling. 4. Background of very fine centrilobular nodularity throughout the lungs, most concentrated at the apices. Scattered bronchiolar plugging. Findings are most consistent with smoking-related respiratory bronchiolitis. 5. Coarse, nodular contour of the liver in the included upper abdomen, suggestive of cirrhosis. 6. Coronary artery disease.  # COPD-  # DM- diet controlled; chronic back pain- hydrocodone 5 day; chronic left lower extremity weakness.  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 Currently on Synthroid 150 mcg/day-  # NGS/MOLECULAR TESTS:    # PALLIATIVE CARE EVALUATION:  # PAIN MANAGEMENT:    DIAGNOSIS:   STAGE:         ;  GOALS:  CURRENT/MOST RECENT THERAPY :    A. LYMPH NODE, HILAR, EXCISION:  - Lymph node negative for metastatic carcinoma.   B. LYMPH NODE, LEVEL 4, EXCISION:  - Lymph node negative for metastatic carcinoma.   C. LUNG, RIGHT UPPER LOBE, LOBECTOMY:  - Squamous cell carcinoma, 4.5 cm.  - Carcinoma extends through visceral pleura and focally involves  parietal pleura.  -  Surgical margins negative for carcinoma.  - One lymph node negative for carcinoma.  - See oncology table and comment.   D. SOFT TISSUE, CHEST WALL, BIOPSY:  - Fibroadipose tissue with focal inflammation and fibrosis.  - No carcinoma identified.   ONCOLOGY TABLE:  LUNG: Resection  Synchronous Tumors: Not applicable.  Total Number of Primary Tumors: 1  Procedure: Right upper lobectomy with lymph node biopsies and chest wall  biopsy.  Specimen Laterality: Right upper lobe.  Tumor Focality: Unifocal.  Tumor Site: Right upper lobe.  Tumor Size: 4.5 x 4 x 3 cm.  Histologic Type: Squamous cell carcinoma.  Visceral Pleura Invasion: Present.  Direct Invasion of Adjacent Structures: Focal involvement of parietal  pleura.  Lymphovascular Invasion: Not identified.  Margins: All surgical margins negative for carcinoma.  Regional Lymph Nodes:       Number of Lymph Nodes Involved: 0                            Nodal Sites with Tumor: 0       Number of Lymph Nodes Examined: 3                       Nodal Sites Examined: 2  Pathologic Stage Classification (pTNM, AJCC 8th Edition): pT3, pN0  Ancillary Studies: Can be performed if requested.   #Right upper lobe stage IIb-pT3 [not cisplatin candidate]; CarboTaxol x4. April 11th, 2023-Atezo q 3 w x1 year  # AUG 2023- IATROGENIC HYPOTHYRODISM- TSH 100; START SYNTHROID   Malignant neoplasm of upper lobe of right lung (HCC)  04/24/2021 Initial Diagnosis   Malignant neoplasm of  upper lobe of right lung (HCC)   05/02/2021 Cancer Staging   Staging form: Lung, AJCC 8th Edition - Clinical: Stage IB (cT2a, cN0, cM0) - Signed by Earna Coder, MD on 05/02/2021 Stage prefix: Initial diagnosis   07/23/2021 Cancer Staging   Staging form: Lung, AJCC 8th Edition - Pathologic: Stage IIB (pT3, pN0, cM0) - Signed by Earna Coder, MD on 07/23/2021 Stage prefix: Initial diagnosis    HISTORY OF PRESENTING ILLNESS:  Patient is alone.  in a wheel  chair.   Olivia Buchanan 73 y.o.  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  Pt having knee pain and L hip pain (arthritis). She is in a wheelchair today. Breathing is good. Denies cough or dyspnea. New denture implants has impacted on her eating, she is also on mounjaro.   Review of Systems  Constitutional:  Positive for malaise/fatigue and weight loss. Negative for chills, diaphoresis and fever.  HENT:  Negative for nosebleeds and sore throat.   Eyes:  Negative for double vision.  Respiratory:  Positive for cough and shortness of breath. Negative for hemoptysis and wheezing.   Cardiovascular:  Negative for chest pain, palpitations, orthopnea and leg swelling.  Gastrointestinal:  Negative for abdominal pain, blood in stool, constipation, diarrhea, heartburn, melena and vomiting.  Genitourinary:  Negative for dysuria, frequency and urgency.  Musculoskeletal:  Positive for back pain, joint pain and neck pain.  Skin: Negative.  Negative for itching and rash.  Neurological:  Negative for tingling, focal weakness and weakness.  Endo/Heme/Allergies:  Does not bruise/bleed easily.  Psychiatric/Behavioral:  Negative for depression. The patient is not nervous/anxious and does not have insomnia.      MEDICAL HISTORY:  Past Medical History:  Diagnosis Date   Angioedema 12/08/2019   Anxiety    Aortic atherosclerosis (HCC)    Asthma    CAD (coronary artery disease)    3 vessel   Cancer (HCC)    COPD (chronic obstructive pulmonary disease) (HCC) 12/08/2019   DDD (degenerative disc disease), lumbar    Depression    Diabetes mellitus without complication (HCC)    no meds, diet controlled   Dyspnea    GERD (gastroesophageal reflux disease)    HTN (hypertension) 12/08/2019   Hyperlipidemia    Hypertension    Mass of upper lobe of right lung 04/02/2021   spiculated mass RIGHT posterior pulmonary apex with  associated hilar and pretracheal LAD; measured 3.2 x 2.8 cm   Obesity (BMI 30-39.9)    T2DM (type 2 diabetes mellitus) (HCC) 12/08/2019    SURGICAL HISTORY: Past Surgical History:  Procedure Laterality Date   BACK SURGERY     lumbar L5-S1 ruptured disc x 3 surgeries   CERVICAL FUSION  2009   FINE NEEDLE ASPIRATION  06/23/2021   Procedure: FINE NEEDLE ASPIRATION (FNA) LINEAR;  Surgeon: Josephine Igo, DO;  Location: [facility];  Service: Pulmonary;;   INTERCOSTAL NERVE BLOCK Right 07/09/2021   Procedure: INTERCOSTAL NERVE BLOCK;  Surgeon: Corliss Skains, MD;  Location: [facility] OR;  Service: Thoracic;  Laterality: Right;   IR IMAGING GUIDED PORT INSERTION  08/06/2021   LAPAROSCOPIC TOTAL HYSTERECTOMY  1998   with oophorectomy   LYMPH NODE DISSECTION Right 07/09/2021   Procedure: LYMPH NODE DISSECTION;  Surgeon: Corliss Skains, MD;  Location: [facility] OR;  Service: Thoracic;  Laterality: Right;   TONSILLECTOMY     VIDEO BRONCHOSCOPY WITH ENDOBRONCHIAL NAVIGATION N/A 04/15/2021  Procedure: VIDEO BRONCHOSCOPY WITH ENDOBRONCHIAL NAVIGATION;  Surgeon: Vida Rigger, MD;  Location: [facility];  Service: Thoracic;  Laterality: N/A;   VIDEO BRONCHOSCOPY WITH ENDOBRONCHIAL ULTRASOUND N/A 04/15/2021   Procedure: VIDEO BRONCHOSCOPY WITH ENDOBRONCHIAL ULTRASOUND;  Surgeon: Vida Rigger, MD;  Location: [facility];  Service: Thoracic;  Laterality: N/A;   VIDEO BRONCHOSCOPY WITH ENDOBRONCHIAL ULTRASOUND N/A 06/23/2021   Procedure: VIDEO BRONCHOSCOPY WITH ENDOBRONCHIAL ULTRASOUND;  Surgeon: Josephine Igo, DO;  Location: [facility];  Service: Pulmonary;  Laterality: N/A;    SOCIAL HISTORY: Social History   Socioeconomic History   Marital status: Married    Spouse name: Dwight   Number of children: 3   Years of education: Not on file   Highest education level: Not on file  Occupational History   Not on file  Tobacco Use   Smoking status: Former    Current packs/day: 0.00    Average  packs/day: 3.0 packs/day for 50.0 years (150.0 ttl pk-yrs)    Types: Cigarettes    Start date: 89    Quit date: 2019    Years since quitting: 6.2   Smokeless tobacco: Never  Vaping Use   Vaping status: Never Used  Substance and Sexual Activity   Alcohol use: Not Currently   Drug use: Never   Sexual activity: Not on file    Comment: Hysterectomy  Other Topics Concern   Not on file  Social History Narrative   Lives in home; with husband; [place]; never smoking 2019; no alcohol. 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    Social Drivers of Health   Financial Resource Strain: Low Risk  (12/03/2022)   Received from [facility], [facility]   Overall Financial Resource Strain (CARDIA)    Difficulty of Paying Living Expenses: Not hard at all  Food Insecurity: No Food Insecurity (12/03/2022)   Received from [facility], [facility]   Hunger Vital Sign    Worried About Running Out of Food in the Last Year: Never true    Ran Out of Food in the Last Year: Never true  Transportation Needs: No Transportation Needs (12/03/2022)   Received from [facility], [facility]   [facility] - Transportation    In the past 12 months, has lack of transportation kept you from medical appointments or from getting medications?: No    Lack of Transportation (Non-Medical): No  Physical Activity: Not on file  Stress: Not on file  Social Connections: Not on file  Intimate Partner Violence: Not on file    FAMILY HISTORY: Family History  Problem Relation Age of Onset   Bone cancer Maternal Uncle     ALLERGIES:  is allergic to ibuprofen and macrobid [nitrofurantoin].  MEDICATIONS:  Current Outpatient Medications  Medication Sig Dispense Refill   ascorbic acid (VITAMIN C) 500 MG tablet Take 500 mg by mouth daily.     aspirin EC 81 MG tablet Take 81 mg by mouth at bedtime.      clonazePAM (KLONOPIN) 0.5 MG tablet Take 0.5 mg by mouth in the morning, at noon, and at bedtime.     diclofenac Sodium (VOLTAREN ARTHRITIS PAIN) 1 % GEL Apply topically 4 (four) times daily.     diphenoxylate-atropine (LOMOTIL) 2.5-0.025 MG tablet Take by mouth.     gabapentin (NEURONTIN) 300 MG capsule Take 300 mg by mouth 3 (three) times daily. Take 300 mg qam and lunch and take 600 mg  qhs     hydrochlorothiazide (HYDRODIURIL) 25 MG tablet Take 25 mg by mouth every other day.     HYDROcodone-acetaminophen (NORCO) 10-325 MG tablet Take 1 tablet by mouth every 6 (six) hours as needed.     levothyroxine (SYNTHROID) 125 MCG tablet TAKE 1 TABLET(125 MCG) BY MOUTH DAILY BEFORE BREAKFAST 90 tablet 1   lidocaine-prilocaine (EMLA) cream Use as directed prior to port being accessed 30 g 2   Melatonin 10 MG TABS Take 10 mg by mouth at bedtime.     MOUNJARO 2.5 MG/0.5ML Pen Inject 2.5 mg into the skin once a week.     Multiple Vitamin (MULTI-VITAMIN) tablet Take 1 tablet by mouth at bedtime.     nystatin cream (MYCOSTATIN) Apply 1 application topically 2 (two) times daily as needed for dry skin (around lips).     potassium chloride SA (KLOR-CON) 20 MEQ tablet Take 20 mEq by mouth 2 (two) times daily.     pravastatin (PRAVACHOL) 80 MG tablet Take 80 mg by mouth at bedtime.     tiZANidine (ZANAFLEX) 4 MG tablet Take 4 mg by mouth 3 (three) times daily.     traZODone (DESYREL) 100 MG tablet Take 50 mg by mouth at bedtime.     venlafaxine XR (EFFEXOR-XR) 75 MG 24 hr capsule Take 150 mg by mouth at bedtime.     Vitamin D, Ergocalciferol, (DRISDOL) 1.25 MG (50000 UNIT) CAPS capsule TAKE 1 CAPSULE BY MOUTH 1 TIME A WEEK 12 capsule 1   albuterol (VENTOLIN HFA) 108 (90 Base) MCG/ACT inhaler Inhale 1 puff into the lungs every 6 (six) hours as needed for wheezing. (Patient not taking: Reported on 12/28/2023)     EPINEPHrine 0.3 mg/0.3 mL IJ SOAJ injection Inject 0.3 mLs (0.3 mg total) into the muscle as needed  for anaphylaxis. (Patient not taking: Reported on 09/05/2023) 2 each 0   fluticasone-salmeterol (ADVAIR) 250-50 MCG/ACT AEPB Inhale 1 puff into the lungs in the morning and at bedtime. (Patient not taking: Reported on 12/28/2023)     ipratropium-albuterol (DUONEB) 0.5-2.5 (3) MG/3ML SOLN Take 3 mLs by nebulization 3 (three) times daily. (Patient not taking: Reported on 12/28/2023)     pantoprazole (PROTONIX) 40 MG tablet Take 40 mg by mouth at bedtime. (Patient not taking: Reported on 12/28/2023)     prochlorperazine (COMPAZINE) 10 MG tablet TAKE 1 TABLET(10 MG) BY MOUTH EVERY 6 HOURS AS NEEDED FOR NAUSEA OR VOMITING (Patient not taking: Reported on 12/28/2023) 30 tablet 1   No current facility-administered medications for this visit.   Facility-Administered Medications Ordered in Other Visits  Medication Dose Route Frequency Provider Last Rate Last Admin   heparin lock flush 100 UNIT/ML injection            sodium chloride flush (NS) 0.9 % injection 10 mL  10 mL Intravenous Once Earna Coder, MD        PHYSICAL EXAMINATION: ECOG PERFORMANCE STATUS: 1 - Symptomatic but completely ambulatory  Vitals:   12/28/23 1316  BP: (!) 90/50  Pulse: 73  Resp: 17  Temp: 98.3 F (36.8 C)  SpO2: 99%      Filed Weights   12/28/23 1316  Weight: 145 lb 12.8 oz (66.1 kg)    Physical Exam Vitals and nursing note reviewed.  HENT:     Head: Normocephalic and atraumatic.     Mouth/Throat:     Mouth: Mucous membranes are moist.     Pharynx: Oropharynx is clear. No oropharyngeal exudate.  Eyes:  Extraocular Movements: Extraocular movements intact.     Pupils: Pupils are equal, round, and reactive to light.  Cardiovascular:     Rate and Rhythm: Normal rate and regular rhythm.  Pulmonary:     Effort: No respiratory distress.     Breath sounds: No wheezing.     Comments: Decreased breath sounds bilaterally at bases.  No wheeze or crackles Abdominal:     General: Bowel sounds are normal.  There is no distension.     Palpations: Abdomen is soft. There is no mass.     Tenderness: There is no abdominal tenderness. There is no guarding or rebound.  Musculoskeletal:        General: No tenderness. Normal range of motion.     Cervical back: Normal range of motion and neck supple.  Skin:    General: Skin is warm.  Neurological:     General: No focal deficit present.     Mental Status: She is alert and oriented to person, place, and time.     Comments: 4-5 strength in the left lower extremity/chronic.  Psychiatric:        Mood and Affect: Affect normal.        Behavior: Behavior normal.        Judgment: Judgment normal.      LABORATORY DATA:  I have reviewed the data as listed Lab Results  Component Value Date   WBC 7.0 12/28/2023   HGB 11.9 (L) 12/28/2023   HCT 34.6 (L) 12/28/2023   MCV 91.1 12/28/2023   PLT 148 (L) 12/28/2023   Recent Labs    05/03/23 1315 09/05/23 1254 12/28/23 1255  NA 137 141 139  K 3.9 3.9 4.1  CL 105 103 105  CO2 26 28 26   GLUCOSE 91 105* 87  BUN 9 10 9   CREATININE 0.67 0.68 0.67  CALCIUM 8.7* 8.7* 8.9  GFRNONAA >60 >60 >60  PROT 7.1 6.7 7.1  ALBUMIN 3.8 3.6 3.7  AST 36 31 25  ALT 17 16 13   ALKPHOS 58 49 55  BILITOT 0.3 0.6 0.7    RADIOGRAPHIC STUDIES: I have personally reviewed the radiological images as listed and agreed with the findings in the report. No results found.  ASSESSMENT & PLAN:   Malignant neoplasm of upper lobe of right lung (HCC) # pT3N0 [4.5cm; Squamous cell ca- + involvement of the parietal pleura]; stage II. Currently s/p adjuvant carbo-taxol x4 [poor candidate for cisplatin].  PD-L1-5%; Currently  s/p  adjuvant Atezolizumab x 13 cycles; NOV 11th, 2024- Chest-contrast-  Right upper lobectomy with pleuroparenchymal scarring in the right hemithorax. AP window lymph node has decreased slightly in size in the interval. 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 Will  plan CT scan in 6 months; will order CT scan today-   # Hypokalemia: Off hydrochlorothiazide - On Kdur BID- 3.5-  stable.  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 continue-vit D 50,000- stable.  # chronic back pain/chronic left LE/Bil PN-history of cervical spine injury-no evidence of metastatic disease.  Continue hydrocodone [for now as per Dr. Judithann Sheen.  stable.  # Iatrogenic hypo-hyperthyroidism- [AUG 2023]-TSH-100 mcg/day-; JAN 2024-TSH- LOW DEC 2023- synthoird- 100 mg/day; monitor for now. stable. Will monitor for now.  TSH slowly improving. stable.    # History of diabetes-OFF metformin [sec to diarrhea] [facility] 2023-Hb A1c-Hb A1C-5.9]. on Monjauro/ weight loss-  stable.   # IV access: Mediport-  functional.   # DISPOSITION:  # follow up in  3 months-  MD: port flush-labs- cbc/cmp;thyroid panel-Dr.B  # I reviewed the blood work- with the patient in detail; also reviewed the imaging independently [as summarized above]; and with the patient in detail.     All questions were answered. The patient knows to call the clinic with any problems, questions or concerns.    Earna Coder, MD 12/28/2023 3:07 PM

## 2023-12-29 ENCOUNTER — Other Ambulatory Visit: Payer: Self-pay | Admitting: Internal Medicine

## 2023-12-29 ENCOUNTER — Other Ambulatory Visit: Payer: Self-pay

## 2023-12-29 DIAGNOSIS — D491 Neoplasm of unspecified behavior of respiratory system: Secondary | ICD-10-CM

## 2023-12-29 LAB — THYROID PANEL WITH TSH
Free Thyroxine Index: 3.4 (ref 1.2–4.9)
T3 Uptake Ratio: 28 % (ref 24–39)
T4, Total: 12.3 ug/dL — ABNORMAL HIGH (ref 4.5–12.0)
TSH: 0.019 u[IU]/mL — ABNORMAL LOW (ref 0.450–4.500)

## 2024-01-02 ENCOUNTER — Other Ambulatory Visit: Payer: Self-pay | Admitting: Internal Medicine

## 2024-01-02 DIAGNOSIS — D491 Neoplasm of unspecified behavior of respiratory system: Secondary | ICD-10-CM

## 2024-01-03 ENCOUNTER — Encounter: Payer: Self-pay | Admitting: Internal Medicine

## 2024-01-10 DIAGNOSIS — Z1231 Encounter for screening mammogram for malignant neoplasm of breast: Secondary | ICD-10-CM | POA: Diagnosis not present

## 2024-01-16 ENCOUNTER — Other Ambulatory Visit: Payer: Self-pay

## 2024-01-31 DIAGNOSIS — R29898 Other symptoms and signs involving the musculoskeletal system: Secondary | ICD-10-CM | POA: Diagnosis not present

## 2024-01-31 DIAGNOSIS — M25552 Pain in left hip: Secondary | ICD-10-CM | POA: Diagnosis not present

## 2024-01-31 DIAGNOSIS — R2689 Other abnormalities of gait and mobility: Secondary | ICD-10-CM | POA: Diagnosis not present

## 2024-02-06 DIAGNOSIS — R2689 Other abnormalities of gait and mobility: Secondary | ICD-10-CM | POA: Diagnosis not present

## 2024-02-06 DIAGNOSIS — M25552 Pain in left hip: Secondary | ICD-10-CM | POA: Diagnosis not present

## 2024-02-06 DIAGNOSIS — R29898 Other symptoms and signs involving the musculoskeletal system: Secondary | ICD-10-CM | POA: Diagnosis not present

## 2024-02-13 DIAGNOSIS — R2689 Other abnormalities of gait and mobility: Secondary | ICD-10-CM | POA: Diagnosis not present

## 2024-02-13 DIAGNOSIS — M25552 Pain in left hip: Secondary | ICD-10-CM | POA: Diagnosis not present

## 2024-02-13 DIAGNOSIS — R29898 Other symptoms and signs involving the musculoskeletal system: Secondary | ICD-10-CM | POA: Diagnosis not present

## 2024-02-17 ENCOUNTER — Telehealth: Payer: Self-pay | Admitting: Internal Medicine

## 2024-02-17 NOTE — Telephone Encounter (Signed)
 Pt left a vm and stated she wants to have the location of her scan changed. Pt stated she only goes to [facility]. Can you change the order? Pt stated to schedule appt and let her know the details.  Thank you

## 2024-02-20 ENCOUNTER — Other Ambulatory Visit: Payer: Self-pay | Admitting: *Deleted

## 2024-02-20 ENCOUNTER — Other Ambulatory Visit: Payer: Self-pay

## 2024-02-20 DIAGNOSIS — C3411 Malignant neoplasm of upper lobe, right bronchus or lung: Secondary | ICD-10-CM

## 2024-02-21 DIAGNOSIS — M25552 Pain in left hip: Secondary | ICD-10-CM | POA: Diagnosis not present

## 2024-02-21 DIAGNOSIS — R29898 Other symptoms and signs involving the musculoskeletal system: Secondary | ICD-10-CM | POA: Diagnosis not present

## 2024-02-21 DIAGNOSIS — R2689 Other abnormalities of gait and mobility: Secondary | ICD-10-CM | POA: Diagnosis not present

## 2024-03-12 DIAGNOSIS — M25552 Pain in left hip: Secondary | ICD-10-CM | POA: Diagnosis not present

## 2024-03-12 DIAGNOSIS — R2689 Other abnormalities of gait and mobility: Secondary | ICD-10-CM | POA: Diagnosis not present

## 2024-03-12 DIAGNOSIS — R29898 Other symptoms and signs involving the musculoskeletal system: Secondary | ICD-10-CM | POA: Diagnosis not present

## 2024-03-21 ENCOUNTER — Ambulatory Visit
Admission: RE | Admit: 2024-03-21 | Discharge: 2024-03-21 | Disposition: A | Discharge status: Home / Self Care | Source: Ambulatory Visit | Attending: Internal Medicine | Admitting: Internal Medicine

## 2024-03-21 ENCOUNTER — Inpatient Hospital Stay: Admission: RE | Admit: 2024-03-21 | Source: Ambulatory Visit

## 2024-03-21 DIAGNOSIS — C3411 Malignant neoplasm of upper lobe, right bronchus or lung: Secondary | ICD-10-CM | POA: Insufficient documentation

## 2024-03-21 DIAGNOSIS — J439 Emphysema, unspecified: Secondary | ICD-10-CM | POA: Diagnosis not present

## 2024-03-21 DIAGNOSIS — I700 Atherosclerosis of aorta: Secondary | ICD-10-CM | POA: Diagnosis not present

## 2024-03-21 MED ORDER — IOHEXOL 300 MG/ML  SOLN
75.0000 mL | Freq: Once | INTRAMUSCULAR | Status: AC | PRN
  Administered 2024-03-21: 75 mL via INTRAVENOUS

## 2024-03-28 ENCOUNTER — Inpatient Hospital Stay (HOSPITAL_BASED_OUTPATIENT_CLINIC_OR_DEPARTMENT_OTHER): Admitting: Internal Medicine

## 2024-03-28 ENCOUNTER — Encounter: Payer: Self-pay | Admitting: Internal Medicine

## 2024-03-28 ENCOUNTER — Inpatient Hospital Stay: Attending: Internal Medicine

## 2024-03-28 VITALS — BP 104/65 | HR 84 | Temp 98.7°F | Resp 16 | Ht 59.0 in | Wt 135.0 lb

## 2024-03-28 DIAGNOSIS — C3411 Malignant neoplasm of upper lobe, right bronchus or lung: Secondary | ICD-10-CM | POA: Diagnosis not present

## 2024-03-28 DIAGNOSIS — E876 Hypokalemia: Secondary | ICD-10-CM | POA: Insufficient documentation

## 2024-03-28 DIAGNOSIS — E119 Type 2 diabetes mellitus without complications: Secondary | ICD-10-CM | POA: Diagnosis not present

## 2024-03-28 DIAGNOSIS — Z85118 Personal history of other malignant neoplasm of bronchus and lung: Secondary | ICD-10-CM | POA: Diagnosis not present

## 2024-03-28 DIAGNOSIS — Z08 Encounter for follow-up examination after completed treatment for malignant neoplasm: Secondary | ICD-10-CM | POA: Insufficient documentation

## 2024-03-28 LAB — CMP (CANCER CENTER ONLY)
ALT: 23 U/L (ref 0–44)
AST: 36 U/L (ref 15–41)
Albumin: 3.8 g/dL (ref 3.5–5.0)
Alkaline Phosphatase: 54 U/L (ref 38–126)
Anion gap: 7 (ref 5–15)
BUN: 12 mg/dL (ref 8–23)
CO2: 26 mmol/L (ref 22–32)
Calcium: 9.2 mg/dL (ref 8.9–10.3)
Chloride: 103 mmol/L (ref 98–111)
Creatinine: 0.76 mg/dL (ref 0.44–1.00)
GFR, Estimated: >60 mL/min (ref >60)
Glucose, Bld: 108 mg/dL — ABNORMAL HIGH (ref 70–99)
Potassium: 4.3 mmol/L (ref 3.5–5.1)
Sodium: 136 mmol/L (ref 135–145)
Total Bilirubin: 0.6 mg/dL (ref 0.0–1.2)
Total Protein: 7.5 g/dL (ref 6.5–8.1)

## 2024-03-28 LAB — CBC WITH DIFFERENTIAL (CANCER CENTER ONLY)
Abs Immature Granulocytes: 0.01 10*3/uL (ref 0.00–0.07)
Basophils Absolute: 0.1 10*3/uL (ref 0.0–0.1)
Basophils Relative: 1 %
Eosinophils Absolute: 0.3 10*3/uL (ref 0.0–0.5)
Eosinophils Relative: 6 %
HCT: 36.4 % (ref 36.0–46.0)
Hemoglobin: 12.4 g/dL (ref 12.0–15.0)
Immature Granulocytes: 0 %
Lymphocytes Relative: 31 %
Lymphs Abs: 1.7 10*3/uL (ref 0.7–4.0)
MCH: 31.6 pg (ref 26.0–34.0)
MCHC: 34.1 g/dL (ref 30.0–36.0)
MCV: 92.6 fL (ref 80.0–100.0)
Monocytes Absolute: 0.4 10*3/uL (ref 0.1–1.0)
Monocytes Relative: 8 %
Neutro Abs: 2.9 10*3/uL (ref 1.7–7.7)
Neutrophils Relative %: 54 %
Platelet Count: 148 10*3/uL — ABNORMAL LOW (ref 150–400)
RBC: 3.93 MIL/uL (ref 3.87–5.11)
RDW: 12 % (ref 11.5–15.5)
WBC Count: 5.4 10*3/uL (ref 4.0–10.5)
nRBC: 0 % (ref 0.0–0.2)

## 2024-03-28 MED ORDER — TRIAMCINOLONE ACETONIDE 0.5 % EX OINT: 1.0000 | TOPICAL_OINTMENT | Freq: Two times a day (BID) | CUTANEOUS | 1 refills | Status: AC

## 2024-03-28 NOTE — Progress Notes (Signed)
 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 Spiculated mass of the posterior right pulmonary apex measuring 3.2 x 2.8 cm, consistent with primary lung malignancy. 2. Enlarged right hilar and pretracheal lymph nodes measuring up to 1.6 x 1.5 cm, suspicious for nodal metastatic disease. 3. Recommend multidisciplinary thoracic referral for consideration of PET-CT metabolic characterization and tissue sampling. 4. Background of very fine centrilobular nodularity throughout the lungs, most concentrated at the apices. Scattered bronchiolar plugging. Findings are most consistent with smoking-related respiratory bronchiolitis. 5. Coarse, nodular contour of the liver in the included upper abdomen, suggestive of cirrhosis. 6. Coronary artery disease.  # COPD-  # DM- diet controlled; chronic back pain- hydrocodone  5 day; chronic left lower extremity weakness.  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 Currently on Synthroid  150 mcg/day-  # NGS/MOLECULAR TESTS:    # PALLIATIVE CARE EVALUATION:  # PAIN MANAGEMENT:    DIAGNOSIS:   STAGE:         ;  GOALS:  CURRENT/MOST RECENT THERAPY :    A. LYMPH NODE, HILAR, EXCISION:  - Lymph node negative for metastatic carcinoma.   B. LYMPH NODE, LEVEL 4, EXCISION:  - Lymph node negative for metastatic carcinoma.   C. LUNG, RIGHT UPPER LOBE, LOBECTOMY:  - Squamous cell carcinoma, 4.5 cm.  - Carcinoma extends through visceral pleura and focally involves  parietal pleura.  -  Surgical margins negative for carcinoma.  - One lymph node negative for carcinoma.  - See oncology table and comment.   D. SOFT TISSUE, CHEST WALL, BIOPSY:  - Fibroadipose tissue with focal inflammation and fibrosis.  - No carcinoma identified.   ONCOLOGY TABLE:  LUNG: Resection  Synchronous Tumors: Not applicable.  Total Number of Primary Tumors: 1  Procedure: Right upper lobectomy with lymph node biopsies and chest wall  biopsy.  Specimen Laterality: Right upper lobe.  Tumor Focality: Unifocal.  Tumor Site: Right upper lobe.  Tumor Size: 4.5 x 4 x 3 cm.  Histologic Type: Squamous cell carcinoma.  Visceral Pleura Invasion: Present.  Direct Invasion of Adjacent Structures: Focal involvement of parietal  pleura.  Lymphovascular Invasion: Not identified.  Margins: All surgical margins negative for carcinoma.  Regional Lymph Nodes:       Number of Lymph Nodes Involved: 0                            Nodal Sites with Tumor: 0       Number of Lymph Nodes Examined: 3                       Nodal Sites Examined: 2  Pathologic Stage Classification (pTNM, AJCC 8th Edition): pT3, pN0  Ancillary Studies: Can be performed if requested.   #Right upper lobe stage IIb-pT3 [not cisplatin candidate]; CarboTaxol x4. April 11th, 2023-Atezo q 3 w x1 year  # AUG 2023- IATROGENIC HYPOTHYRODISM- TSH 100; START SYNTHROID    Malignant neoplasm of upper lobe of right lung (HCC)  04/24/2021 Initial Diagnosis   Malignant neoplasm of  upper lobe of right lung (HCC)   05/02/2021 Cancer Staging   Staging form: Lung, AJCC 8th Edition - Clinical: Stage IB (cT2a, cN0, cM0) - Signed by Rennie Cindy SAUNDERS, MD on 05/02/2021 Stage prefix: Initial diagnosis   07/23/2021 Cancer Staging   Staging form: Lung, AJCC 8th Edition - Pathologic: Stage IIB (pT3, pN0, cM0) - Signed by Rennie Cindy SAUNDERS, MD on 07/23/2021 Stage prefix: Initial diagnosis    HISTORY OF PRESENTING ILLNESS:  Patient is with husband. in a  wheel chair.   Olivia Buchanan 73 y.o.  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  Patient noted a rash on her back and chest- started 2 days ago. Complains of itchiness. Taking benadryl  prn.  Itchy and burns. Using no creams on skin. Of note,  She finished a rx of a sulfa for uti.   Pt having knee pain and L hip pain (arthritis). She is in a wheelchair today. Breathing is good. Denies cough or dyspnea. New denture implants has impacted on her eating, she is also on mounjaro.   Review of Systems  Constitutional:  Positive for malaise/fatigue and weight loss. Negative for chills, diaphoresis and fever.  HENT:  Negative for nosebleeds and sore throat.   Eyes:  Negative for double vision.  Respiratory:  Positive for cough and shortness of breath. Negative for hemoptysis and wheezing.   Cardiovascular:  Negative for chest pain, palpitations, orthopnea and leg swelling.  Gastrointestinal:  Negative for abdominal pain, blood in stool, constipation, diarrhea, heartburn, melena and vomiting.  Genitourinary:  Negative for dysuria, frequency and urgency.  Musculoskeletal:  Positive for back pain, joint pain and neck pain.  Skin: Negative.  Negative for itching and rash.  Neurological:  Negative for tingling, focal weakness and weakness.  Endo/Heme/Allergies:  Does not bruise/bleed easily.  Psychiatric/Behavioral:  Negative for depression. The patient is not nervous/anxious and does not have insomnia.      MEDICAL HISTORY:  Past Medical History:  Diagnosis Date   Angioedema 12/08/2019   Anxiety    Aortic atherosclerosis (HCC)    Asthma    CAD (coronary artery disease)    3 vessel   Cancer (HCC)    COPD (chronic obstructive pulmonary disease) (HCC) 12/08/2019   DDD (degenerative disc disease), lumbar    Depression    Diabetes mellitus without complication (HCC)    no meds, diet  controlled   Dyspnea    GERD (gastroesophageal reflux disease)    HTN (hypertension) 12/08/2019   Hyperlipidemia    Hypertension    Mass of upper lobe of right lung 04/02/2021   spiculated mass RIGHT posterior pulmonary apex with associated hilar and pretracheal LAD; measured 3.2 x 2.8 cm   Obesity (BMI 30-39.9)    T2DM (type 2 diabetes mellitus) (HCC) 12/08/2019    SURGICAL HISTORY: Past Surgical History:  Procedure Laterality Date   BACK SURGERY     lumbar L5-S1 ruptured disc x 3 surgeries   CERVICAL FUSION  2009   FINE NEEDLE ASPIRATION  06/23/2021   Procedure: FINE NEEDLE ASPIRATION (FNA) LINEAR;  Surgeon: Brenna Adine CROME, DO;  Location: [facility];  Service: Pulmonary;;   INTERCOSTAL NERVE BLOCK Right 07/09/2021   Procedure: INTERCOSTAL NERVE BLOCK;  Surgeon: Shyrl Linnie KIDD, MD;  Location: [facility] OR;  Service: Thoracic;  Laterality: Right;   IR IMAGING GUIDED PORT INSERTION  08/06/2021   LAPAROSCOPIC TOTAL HYSTERECTOMY  1998  with oophorectomy   LYMPH NODE DISSECTION Right 07/09/2021   Procedure: LYMPH NODE DISSECTION;  Surgeon: Shyrl Linnie KIDD, MD;  Location: [facility] OR;  Service: Thoracic;  Laterality: Right;   TONSILLECTOMY     VIDEO BRONCHOSCOPY WITH ENDOBRONCHIAL NAVIGATION N/A 04/15/2021   Procedure: VIDEO BRONCHOSCOPY WITH ENDOBRONCHIAL NAVIGATION;  Surgeon: Parris Manna, MD;  Location: [facility];  Service: Thoracic;  Laterality: N/A;   VIDEO BRONCHOSCOPY WITH ENDOBRONCHIAL ULTRASOUND N/A 04/15/2021   Procedure: VIDEO BRONCHOSCOPY WITH ENDOBRONCHIAL ULTRASOUND;  Surgeon: Parris Manna, MD;  Location: [facility];  Service: Thoracic;  Laterality: N/A;   VIDEO BRONCHOSCOPY WITH ENDOBRONCHIAL ULTRASOUND N/A 06/23/2021   Procedure: VIDEO BRONCHOSCOPY WITH ENDOBRONCHIAL ULTRASOUND;  Surgeon: Brenna Adine CROME, DO;  Location: [facility];  Service: Pulmonary;  Laterality: N/A;    SOCIAL HISTORY: Social History   Socioeconomic History   Marital status: Married    Spouse  name: Dwight   Number of children: 3   Years of education: Not on file   Highest education level: Not on file  Occupational History   Not on file  Tobacco Use   Smoking status: Former    Current packs/day: 0.00    Average packs/day: 3.0 packs/day for 50.0 years (150.0 ttl pk-yrs)    Types: Cigarettes    Start date: 46    Quit date: 2019    Years since quitting: 6.4   Smokeless tobacco: Never  Vaping Use   Vaping status: Never Used  Substance and Sexual Activity   Alcohol use: Not Currently   Drug use: Never   Sexual activity: Not on file    Comment: Hysterectomy  Other Topics Concern   Not on file  Social History Narrative   Lives in home; with husband; [place]; never smoking 2019; no alcohol. 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    Social Drivers of Health   Financial Resource Strain: Low Risk  (12/03/2022)   Received from [facility]   Overall Financial Resource Strain (CARDIA)    Difficulty of Paying Living Expenses: Not hard at all  Food Insecurity: No Food Insecurity (12/03/2022)   Received from [facility]   Hunger Vital Sign    Within the past 12 months, you worried that your food would run out before you got the money to buy more.: Never true    Within the past 12 months, the food you bought just didn't last and you didn't have money to get more.: Never true  Transportation Needs: No Transportation Needs (12/03/2022)   Received from [facility] - Transportation    In the past 12 months, has lack of transportation kept you from medical appointments or from getting medications?: No    Lack of Transportation (Non-Medical): No  Physical Activity: Not on file  Stress: Not on file  Social Connections: Not on file  Intimate Partner Violence: Not on file    FAMILY HISTORY: Family History  Problem Relation Age of Onset   Bone cancer Maternal Uncle     ALLERGIES:  is  allergic to ibuprofen and macrobid [nitrofurantoin].  MEDICATIONS:  Current Outpatient Medications  Medication Sig Dispense Refill   albuterol  (VENTOLIN  HFA) 108 (90 Base) MCG/ACT inhaler Inhale 1 puff into the lungs every 6 (six) hours as needed for wheezing.     aspirin  EC 81 MG tablet Take 81 mg by mouth at bedtime.     clonazePAM  (KLONOPIN ) 0.5 MG tablet Take 0.5 mg by  mouth in the morning, at noon, and at bedtime.     diclofenac Sodium (VOLTAREN ARTHRITIS PAIN) 1 % GEL Apply topically 4 (four) times daily.     fluticasone-salmeterol (ADVAIR) 250-50 MCG/ACT AEPB Inhale 1 puff into the lungs in the morning and at bedtime.     gabapentin  (NEURONTIN ) 300 MG capsule Take 300 mg by mouth 3 (three) times daily. Take 300 mg qam and lunch and take 600 mg qhs     HYDROcodone -acetaminophen  (NORCO) 10-325 MG tablet Take 1 tablet by mouth every 6 (six) hours as needed.     ipratropium-albuterol  (DUONEB) 0.5-2.5 (3) MG/3ML SOLN Take 3 mLs by nebulization 3 (three) times daily.     levothyroxine  (SYNTHROID ) 125 MCG tablet TAKE 1 TABLET(125 MCG) BY MOUTH DAILY BEFORE BREAKFAST (Patient taking differently: Take 50 mcg by mouth daily before breakfast.) 90 tablet 1   lidocaine -prilocaine  (EMLA ) cream Use as directed prior to port being accessed 30 g 2   Melatonin 10 MG TABS Take 10 mg by mouth at bedtime.     MOUNJARO 2.5 MG/0.5ML Pen Inject 2.5 mg into the skin once a week.     Multiple Vitamin (MULTI-VITAMIN) tablet Take 1 tablet by mouth at bedtime.     nystatin cream (MYCOSTATIN) Apply 1 application topically 2 (two) times daily as needed for dry skin (around lips).     pantoprazole  (PROTONIX ) 40 MG tablet Take 40 mg by mouth at bedtime.     potassium chloride  SA (KLOR-CON ) 20 MEQ tablet Take 20 mEq by mouth 2 (two) times daily.     pravastatin  (PRAVACHOL ) 80 MG tablet Take 80 mg by mouth at bedtime.     prochlorperazine  (COMPAZINE ) 10 MG tablet TAKE 1 TABLET(10 MG) BY MOUTH EVERY 6 HOURS AS NEEDED FOR  NAUSEA OR VOMITING 30 tablet 1   tiZANidine  (ZANAFLEX ) 4 MG tablet Take 4 mg by mouth 3 (three) times daily.     traZODone (DESYREL) 100 MG tablet Take 50 mg by mouth at bedtime.     triamcinolone ointment (KENALOG) 0.5 % Apply 1 Application topically 2 (two) times daily. 30 g 1   venlafaxine  XR (EFFEXOR -XR) 75 MG 24 hr capsule Take 150 mg by mouth at bedtime.     Vitamin D , Ergocalciferol , (DRISDOL ) 1.25 MG (50000 UNIT) CAPS capsule TAKE 1 CAPSULE BY MOUTH 1 TIME A WEEK 12 capsule 1   ascorbic acid (VITAMIN C) 500 MG tablet Take 500 mg by mouth daily. (Patient not taking: Reported on 03/28/2024)     diphenoxylate-atropine (LOMOTIL) 2.5-0.025 MG tablet Take by mouth. (Patient not taking: Reported on 03/28/2024)     EPINEPHrine  0.3 mg/0.3 mL IJ SOAJ injection Inject 0.3 mLs (0.3 mg total) into the muscle as needed for anaphylaxis. (Patient not taking: Reported on 03/28/2024) 2 each 0   hydrochlorothiazide (HYDRODIURIL) 25 MG tablet Take 25 mg by mouth every other day. (Patient not taking: Reported on 03/28/2024)     No current facility-administered medications for this visit.   Facility-Administered Medications Ordered in Other Visits  Medication Dose Route Frequency Provider Last Rate Last Admin   heparin  lock flush 100 UNIT/ML injection            sodium chloride  flush (NS) 0.9 % injection 10 mL  10 mL Intravenous Once Cardale Dorer R, MD        PHYSICAL EXAMINATION: ECOG PERFORMANCE STATUS: 1 - Symptomatic but completely ambulatory  Vitals:   03/28/24 1452  BP: 104/65  Pulse: 84  Resp: 16  Temp:  98.7 F (37.1 C)  SpO2: 99%      Filed Weights   03/28/24 1452  Weight: 135 lb (61.2 kg)   Skin rash- see below  Physical Exam Vitals and nursing note reviewed.  HENT:     Head: Normocephalic and atraumatic.     Mouth/Throat:     Mouth: Mucous membranes are moist.     Pharynx: Oropharynx is clear. No oropharyngeal exudate.   Eyes:     Extraocular Movements: Extraocular  movements intact.     Pupils: Pupils are equal, round, and reactive to light.    Cardiovascular:     Rate and Rhythm: Normal rate and regular rhythm.  Pulmonary:     Effort: No respiratory distress.     Breath sounds: No wheezing.     Comments: Decreased breath sounds bilaterally at bases.  No wheeze or crackles Abdominal:     General: Bowel sounds are normal. There is no distension.     Palpations: Abdomen is soft. There is no mass.     Tenderness: There is no abdominal tenderness. There is no guarding or rebound.   Musculoskeletal:        General: No tenderness. Normal range of motion.     Cervical back: Normal range of motion and neck supple.   Skin:    General: Skin is warm.   Neurological:     General: No focal deficit present.     Mental Status: She is alert and oriented to person, place, and time.     Comments: 4-5 strength in the left lower extremity/chronic.  Psychiatric:        Mood and Affect: Affect normal.        Behavior: Behavior normal.        Judgment: Judgment normal.       LABORATORY DATA:  I have reviewed the data as listed Lab Results  Component Value Date   WBC 5.4 03/28/2024   HGB 12.4 03/28/2024   HCT 36.4 03/28/2024   MCV 92.6 03/28/2024   PLT 148 (L) 03/28/2024   Recent Labs    09/05/23 1254 12/28/23 1255 03/28/24 1500  NA 141 139 136  K 3.9 4.1 4.3  CL 103 105 103  CO2 28 26 26   GLUCOSE 105* 87 108*  BUN 10 9 12   CREATININE 0.68 0.67 0.76  CALCIUM 8.7* 8.9 9.2  GFRNONAA >60 >60 >60  PROT 6.7 7.1 7.5  ALBUMIN 3.6 3.7 3.8  AST 31 25 36  ALT 16 13 23   ALKPHOS 49 55 54  BILITOT 0.6 0.7 0.6    RADIOGRAPHIC STUDIES: I have personally reviewed the radiological images as listed and agreed with the findings in the report. CT CHEST W CONTRAST Result Date: 03/28/2024 CLINICAL DATA:  Restaging right upper lobe lung cancer. * Tracking Code: BO * EXAM: CT CHEST WITH CONTRAST TECHNIQUE: Multidetector CT imaging of the chest was  performed during intravenous contrast administration. RADIATION DOSE REDUCTION: This exam was performed according to the departmental dose-optimization program which includes automated exposure control, adjustment of the mA and/or kV according to patient size and/or use of iterative reconstruction technique. CONTRAST:  75mL OMNIPAQUE  IOHEXOL  300 MG/ML  SOLN COMPARISON:  08/23/2023 FINDINGS: Cardiovascular: Heart size appears normal. Trace pericardial fluid. Aortic atherosclerosis and coronary artery calcifications. Mediastinum/Nodes: Thyroid  gland, trachea and esophagus are unremarkable. Index AP window lymph node measures 0.8 cm, image 43/2. Previously 1.0. Index right hilar lymph node measures 1 cm, image 62/2. Previously 0.7 cm. Right paratracheal node  measures 0.9 cm versus 0.8 cm previously, image 47/2. Lungs/Pleura: Status post right upper lobectomy. Diffuse bronchial wall thickening. Mild emphysema. Central airways appear grossly patent. Scattered areas of pleural and parenchymal scarring identified. Similar appearance basilar coarsened subpleural ground-glass and bronchiolectasis, right greater than left. Focal ground-glass in the medial right lower lobe has resolved in the interval. No suspicious lung nodules. Upper Abdomen: No acute abnormality. Contour the liver appears slightly irregular and nodular. Unchanged. Musculoskeletal: Thoracolumbar scoliosis, mild. Degenerative changes noted within the spine. No acute or suspicious osseous findings. IMPRESSION: 1. Status post right upper lobectomy. 2. No signs of local recurrence or metastatic disease. 3. Mild increase in size of right hilar lymph node. Attention on follow-up imaging is advised. 4. Similar appearance of basilar coarsened subpleural ground-glass and bronchiolectasis, right greater than left. 5. Coronary artery calcifications. 6. Aortic Atherosclerosis (ICD10-I70.0) and Emphysema (ICD10-J43.9). Electronically Signed   By: Waddell Calk M.D.    On: 03/28/2024 05:27    ASSESSMENT & PLAN:   Malignant neoplasm of upper lobe of right lung (HCC) # pT3N0 [4.5cm; Squamous cell ca- + involvement of the parietal pleura]; stage II. Currently s/p adjuvant carbo-taxol  x4 [poor candidate for cisplatin].  PD-L1-5%; Currently  s/p  adjuvant Atezolizumab  x 13 cycles; discontinued sec to poor tolerance.   # JUNE 18th, 2025-  Chest-contrast- Status post right upper lobectomy.  No signs of local recurrence or metastatic disease; Mild increase in size of right hilar lymph node [1cm]; overall mediastinal stable size. Similar appearance of basilar coarsened subpleural ground-glass and bronchiolectasis, right greater than left. Recommend PET in 4 months.   # Hypokalemia: Off hydrochlorothiazide - currently On Kdur BID- 4.1- can take 1 pill a day-  stable.    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 continue-vit D 50,000-  stable.    # chronic back pain/chronic left LE/Bil PN-history of cervical spine injury-no evidence of metastatic disease.  Continue hydrocodone  [for now as per Dr. Auston.   stable.    # Iatrogenic hypo-hyperthyroidism- [AUG 2023]-TSH-100 mcg/day-; JAN 2024-TSH- LOW DEC 2023- synthoird- 100 mg/day-  TSH slowly improving. stable.    # Rash- on torso- ? Related to recent Bactrim use- recommend/called in Kenalog ointment-; and anti-histamine; if not improved then recommend evaluation with Dr.Sparks for oral steroids if not improved.   # History of diabetes-OFF metformin  [sec to diarrhea] [facility] 2023-Hb A1c-Hb A1C-5.9]. on Monjauro/ weight loss-  stable.   # IV access: Mediport- functional. # port/IV access- Discussed re: pro and cons of keeping the port vs. Explantation. Will HOLD oFF mediport explantation unitl next PET scan results.   #Incidental findings on Imaging  CT , 2025: Coronary artery calcifications; Aortic Atherosclerosis and Emphysema . I  reviewed/discussed/counseled the patient.   Chest-contrast-   # DISPOSITION:  # port flush in 2 months # follow up in  4 months-  MD: port flush-labs- cbc/cmp;thyroid  panel; PET scan prior-Dr.B   # I reviewed the blood work- with the patient in detail; also reviewed the imaging independently [as summarized above]; and with the patient in detail.     All questions were answered. The patient knows to call the clinic with any problems, questions or concerns.    Cindy JONELLE Joe, MD 03/28/2024 4:07 PM

## 2024-03-28 NOTE — Progress Notes (Signed)
 Per Burnard B in Infusion: hey I just got her labs and she has a rash on her back and chest that I just wanted yall to be aware of before she came back there  C/o rash x2days. Itchy and burns. Using no creams on skin. She did take a benadryl . She finished a rx of a sulfa for uti.  She is asking if you can call in her epi pen?  No appetite, having nausea, she does drink a protein drink 1-2 per day.  Has been dx with scoliosis, Dr. Auston, hip xray.  CT chest 03/21/24.

## 2024-03-28 NOTE — Assessment & Plan Note (Addendum)
#   pT3N0 [4.5cm; Squamous cell ca- + involvement of the parietal pleura]; stage II. Currently s/p adjuvant carbo-taxol  x4 [poor candidate for cisplatin].  PD-L1-5%; Currently  s/p  adjuvant Atezolizumab  x 13 cycles; discontinued sec to poor tolerance.   # JUNE 18th, 2025-  Chest-contrast- Status post right upper lobectomy.  No signs of local recurrence or metastatic disease; Mild increase in size of right hilar lymph node [1cm]; overall mediastinal stable size. Similar appearance of basilar coarsened subpleural ground-glass and bronchiolectasis, right greater than left. Recommend PET in 4 months.   # Hypokalemia: Off hydrochlorothiazide - currently On Kdur BID- 4.1- can take 1 pill a day-  stable.    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 continue-vit D 50,000-  stable.    # chronic back pain/chronic left LE/Bil PN-history of cervical spine injury-no evidence of metastatic disease.  Continue hydrocodone  [for now as per Dr. Auston.   stable.    # Iatrogenic hypo-hyperthyroidism- [AUG 2023]-TSH-100 mcg/day-; JAN 2024-TSH- LOW DEC 2023- synthoird- 100 mg/day-  TSH slowly improving. stable.    # Rash- on torso- ? Related to recent Bactrim use- recommend/called in Kenalog ointment-; and anti-histamine; if not improved then recommend evaluation with Dr.Sparks for oral steroids if not improved.   # History of diabetes-OFF metformin  [sec to diarrhea] [facility] 2023-Hb A1c-Hb A1C-5.9]. on Monjauro/ weight loss-  stable.   # IV access: Mediport- functional. # port/IV access- Discussed re: pro and cons of keeping the port vs. Explantation. Will HOLD oFF mediport explantation unitl next PET scan results.   #Incidental findings on Imaging  CT , 2025: Coronary artery calcifications; Aortic Atherosclerosis and Emphysema . I reviewed/discussed/counseled the patient.   Chest-contrast-   # DISPOSITION:  # port flush in 2 months # follow  up in  4 months-  MD: port flush-labs- cbc/cmp;thyroid  panel; PET scan prior-Dr.B   # I reviewed the blood work- with the patient in detail; also reviewed the imaging independently [as summarized above]; and with the patient in detail.

## 2024-03-29 LAB — THYROID PANEL WITH TSH
Free Thyroxine Index: 1.9 (ref 1.2–4.9)
T3 Uptake Ratio: 25 % (ref 24–39)
T4, Total: 7.6 ug/dL (ref 4.5–12.0)
TSH: 1.25 u[IU]/mL (ref 0.450–4.500)

## 2024-03-31 ENCOUNTER — Other Ambulatory Visit: Payer: Self-pay

## 2024-04-03 DIAGNOSIS — Z79899 Other long term (current) drug therapy: Secondary | ICD-10-CM | POA: Diagnosis not present

## 2024-04-03 DIAGNOSIS — I10 Essential (primary) hypertension: Secondary | ICD-10-CM | POA: Diagnosis not present

## 2024-04-03 DIAGNOSIS — Z0000 Encounter for general adult medical examination without abnormal findings: Secondary | ICD-10-CM | POA: Diagnosis not present

## 2024-04-03 DIAGNOSIS — E119 Type 2 diabetes mellitus without complications: Secondary | ICD-10-CM | POA: Diagnosis not present

## 2024-04-03 DIAGNOSIS — Z1331 Encounter for screening for depression: Secondary | ICD-10-CM | POA: Diagnosis not present

## 2024-04-03 DIAGNOSIS — E039 Hypothyroidism, unspecified: Secondary | ICD-10-CM | POA: Diagnosis not present

## 2024-04-03 DIAGNOSIS — J431 Panlobular emphysema: Secondary | ICD-10-CM | POA: Diagnosis not present

## 2024-04-17 DIAGNOSIS — R29898 Other symptoms and signs involving the musculoskeletal system: Secondary | ICD-10-CM | POA: Diagnosis not present

## 2024-04-17 DIAGNOSIS — M25552 Pain in left hip: Secondary | ICD-10-CM | POA: Diagnosis not present

## 2024-04-17 DIAGNOSIS — R2689 Other abnormalities of gait and mobility: Secondary | ICD-10-CM | POA: Diagnosis not present

## 2024-05-04 ENCOUNTER — Other Ambulatory Visit: Payer: Self-pay | Admitting: Internal Medicine

## 2024-05-06 ENCOUNTER — Encounter: Payer: Self-pay | Admitting: Internal Medicine

## 2024-06-26 ENCOUNTER — Other Ambulatory Visit: Payer: Self-pay

## 2024-06-27 ENCOUNTER — Other Ambulatory Visit: Payer: Self-pay

## 2024-06-28 DIAGNOSIS — I10 Essential (primary) hypertension: Secondary | ICD-10-CM | POA: Diagnosis not present

## 2024-06-28 DIAGNOSIS — R8290 Unspecified abnormal findings in urine: Secondary | ICD-10-CM | POA: Diagnosis not present

## 2024-06-28 DIAGNOSIS — E039 Hypothyroidism, unspecified: Secondary | ICD-10-CM | POA: Diagnosis not present

## 2024-06-28 DIAGNOSIS — Z79899 Other long term (current) drug therapy: Secondary | ICD-10-CM | POA: Diagnosis not present

## 2024-06-28 DIAGNOSIS — E782 Mixed hyperlipidemia: Secondary | ICD-10-CM | POA: Diagnosis not present

## 2024-06-28 DIAGNOSIS — E118 Type 2 diabetes mellitus with unspecified complications: Secondary | ICD-10-CM | POA: Diagnosis not present

## 2024-07-05 DIAGNOSIS — G8929 Other chronic pain: Secondary | ICD-10-CM | POA: Diagnosis not present

## 2024-07-05 DIAGNOSIS — I10 Essential (primary) hypertension: Secondary | ICD-10-CM | POA: Diagnosis not present

## 2024-07-05 DIAGNOSIS — Z1211 Encounter for screening for malignant neoplasm of colon: Secondary | ICD-10-CM | POA: Diagnosis not present

## 2024-07-05 DIAGNOSIS — M5441 Lumbago with sciatica, right side: Secondary | ICD-10-CM | POA: Diagnosis not present

## 2024-07-05 DIAGNOSIS — E039 Hypothyroidism, unspecified: Secondary | ICD-10-CM | POA: Diagnosis not present

## 2024-07-05 DIAGNOSIS — E782 Mixed hyperlipidemia: Secondary | ICD-10-CM | POA: Diagnosis not present

## 2024-07-05 DIAGNOSIS — M5442 Lumbago with sciatica, left side: Secondary | ICD-10-CM | POA: Diagnosis not present

## 2024-07-05 DIAGNOSIS — E119 Type 2 diabetes mellitus without complications: Secondary | ICD-10-CM | POA: Diagnosis not present

## 2024-07-05 DIAGNOSIS — F419 Anxiety disorder, unspecified: Secondary | ICD-10-CM | POA: Diagnosis not present

## 2024-07-19 ENCOUNTER — Other Ambulatory Visit: Payer: Self-pay

## 2024-07-23 DIAGNOSIS — G959 Disease of spinal cord, unspecified: Secondary | ICD-10-CM | POA: Diagnosis not present

## 2024-07-23 DIAGNOSIS — M5416 Radiculopathy, lumbar region: Secondary | ICD-10-CM | POA: Diagnosis not present

## 2024-07-23 DIAGNOSIS — M549 Dorsalgia, unspecified: Secondary | ICD-10-CM | POA: Diagnosis not present

## 2024-07-23 DIAGNOSIS — M5489 Other dorsalgia: Secondary | ICD-10-CM | POA: Diagnosis not present

## 2024-07-23 DIAGNOSIS — M5412 Radiculopathy, cervical region: Secondary | ICD-10-CM | POA: Diagnosis not present

## 2024-07-25 ENCOUNTER — Other Ambulatory Visit: Payer: Self-pay | Admitting: Family Medicine

## 2024-07-25 DIAGNOSIS — M5414 Radiculopathy, thoracic region: Secondary | ICD-10-CM

## 2024-07-25 DIAGNOSIS — M5416 Radiculopathy, lumbar region: Secondary | ICD-10-CM

## 2024-07-25 DIAGNOSIS — M5412 Radiculopathy, cervical region: Secondary | ICD-10-CM

## 2024-07-27 ENCOUNTER — Ambulatory Visit
Admission: RE | Admit: 2024-07-27 | Discharge: 2024-07-27 | Disposition: A | Discharge status: Home / Self Care | Source: Ambulatory Visit | Attending: Internal Medicine | Admitting: Internal Medicine

## 2024-07-27 DIAGNOSIS — I517 Cardiomegaly: Secondary | ICD-10-CM | POA: Diagnosis not present

## 2024-07-27 DIAGNOSIS — I2510 Atherosclerotic heart disease of native coronary artery without angina pectoris: Secondary | ICD-10-CM | POA: Insufficient documentation

## 2024-07-27 DIAGNOSIS — C3490 Malignant neoplasm of unspecified part of unspecified bronchus or lung: Secondary | ICD-10-CM | POA: Diagnosis not present

## 2024-07-27 DIAGNOSIS — C3411 Malignant neoplasm of upper lobe, right bronchus or lung: Secondary | ICD-10-CM | POA: Diagnosis not present

## 2024-07-27 DIAGNOSIS — I7143 Infrarenal abdominal aortic aneurysm, without rupture: Secondary | ICD-10-CM | POA: Diagnosis not present

## 2024-07-27 DIAGNOSIS — I700 Atherosclerosis of aorta: Secondary | ICD-10-CM | POA: Diagnosis not present

## 2024-07-27 DIAGNOSIS — E119 Type 2 diabetes mellitus without complications: Secondary | ICD-10-CM | POA: Diagnosis not present

## 2024-07-27 LAB — GLUCOSE, CAPILLARY: Glucose-Capillary: 65 mg/dL — ABNORMAL LOW (ref 70–99)

## 2024-07-27 MED ORDER — FLUDEOXYGLUCOSE F - 18 (FDG) INJECTION
7.4800 | Freq: Once | INTRAVENOUS | Status: AC | PRN
  Administered 2024-07-27: 7.48 via INTRAVENOUS

## 2024-07-31 ENCOUNTER — Other Ambulatory Visit: Payer: Self-pay

## 2024-08-03 ENCOUNTER — Encounter: Payer: Self-pay | Admitting: Internal Medicine

## 2024-08-06 ENCOUNTER — Inpatient Hospital Stay

## 2024-08-06 ENCOUNTER — Encounter: Payer: Self-pay | Admitting: Internal Medicine

## 2024-08-06 ENCOUNTER — Other Ambulatory Visit

## 2024-08-06 ENCOUNTER — Inpatient Hospital Stay: Attending: Internal Medicine | Admitting: Internal Medicine

## 2024-08-06 DIAGNOSIS — G620 Drug-induced polyneuropathy: Secondary | ICD-10-CM | POA: Diagnosis not present

## 2024-08-06 DIAGNOSIS — E876 Hypokalemia: Secondary | ICD-10-CM | POA: Insufficient documentation

## 2024-08-06 DIAGNOSIS — Z85118 Personal history of other malignant neoplasm of bronchus and lung: Secondary | ICD-10-CM | POA: Diagnosis not present

## 2024-08-06 DIAGNOSIS — E119 Type 2 diabetes mellitus without complications: Secondary | ICD-10-CM | POA: Diagnosis not present

## 2024-08-06 DIAGNOSIS — Z08 Encounter for follow-up examination after completed treatment for malignant neoplasm: Secondary | ICD-10-CM | POA: Diagnosis not present

## 2024-08-06 DIAGNOSIS — C3411 Malignant neoplasm of upper lobe, right bronchus or lung: Secondary | ICD-10-CM

## 2024-08-06 LAB — CBC WITH DIFFERENTIAL (CANCER CENTER ONLY)
Abs Immature Granulocytes: 0.01 K/uL (ref 0.00–0.07)
Basophils Absolute: 0.1 K/uL (ref 0.0–0.1)
Basophils Relative: 1 %
Eosinophils Absolute: 0.2 K/uL (ref 0.0–0.5)
Eosinophils Relative: 5 %
HCT: 32.9 % — ABNORMAL LOW (ref 36.0–46.0)
Hemoglobin: 11.2 g/dL — ABNORMAL LOW (ref 12.0–15.0)
Immature Granulocytes: 0 %
Lymphocytes Relative: 38 %
Lymphs Abs: 1.5 K/uL (ref 0.7–4.0)
MCH: 32.1 pg (ref 26.0–34.0)
MCHC: 34 g/dL (ref 30.0–36.0)
MCV: 94.3 fL (ref 80.0–100.0)
Monocytes Absolute: 0.4 K/uL (ref 0.1–1.0)
Monocytes Relative: 11 %
Neutro Abs: 1.8 K/uL (ref 1.7–7.7)
Neutrophils Relative %: 45 %
Platelet Count: 121 K/uL — ABNORMAL LOW (ref 150–400)
RBC: 3.49 MIL/uL — ABNORMAL LOW (ref 3.87–5.11)
RDW: 11.9 % (ref 11.5–15.5)
WBC Count: 3.9 K/uL — ABNORMAL LOW (ref 4.0–10.5)
nRBC: 0 % (ref 0.0–0.2)

## 2024-08-06 LAB — CMP (CANCER CENTER ONLY)
ALT: 19 U/L (ref 0–44)
AST: 33 U/L (ref 15–41)
Albumin: 3.6 g/dL (ref 3.5–5.0)
Alkaline Phosphatase: 45 U/L (ref 38–126)
Anion gap: 8 (ref 5–15)
BUN: 10 mg/dL (ref 8–23)
CO2: 25 mmol/L (ref 22–32)
Calcium: 8.6 mg/dL — ABNORMAL LOW (ref 8.9–10.3)
Chloride: 103 mmol/L (ref 98–111)
Creatinine: 0.6 mg/dL (ref 0.44–1.00)
GFR, Estimated: >60 mL/min (ref >60)
Glucose, Bld: 87 mg/dL (ref 70–99)
Potassium: 3.7 mmol/L (ref 3.5–5.1)
Sodium: 136 mmol/L (ref 135–145)
Total Bilirubin: 0.6 mg/dL (ref 0.0–1.2)
Total Protein: 7 g/dL (ref 6.5–8.1)

## 2024-08-06 NOTE — Progress Notes (Signed)
  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 Spiculated mass of the posterior right pulmonary apex measuring 3.2 x 2.8 cm, consistent with primary lung malignancy. 2. Enlarged right hilar and pretracheal lymph nodes measuring up to 1.6 x 1.5 cm, suspicious for nodal metastatic disease. 3. Recommend multidisciplinary thoracic referral for consideration of PET-CT metabolic characterization and tissue sampling. 4. Background of very fine centrilobular nodularity throughout the lungs, most concentrated at the apices. Scattered bronchiolar plugging. Findings are most consistent with smoking-related respiratory bronchiolitis. 5. Coarse, nodular contour of the liver in the included upper abdomen, suggestive of cirrhosis. 6. Coronary artery disease.  # COPD-  # DM- diet controlled; chronic back pain- hydrocodone  5 day; chronic left lower extremity weakness.  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 Currently on Synthroid  150 mcg/day-  # NGS/MOLECULAR TESTS:    # PALLIATIVE CARE EVALUATION:  # PAIN MANAGEMENT:    DIAGNOSIS:   STAGE:         ;  GOALS:  CURRENT/MOST RECENT THERAPY :    A. LYMPH NODE, HILAR, EXCISION:  - Lymph node negative for metastatic carcinoma.   B. LYMPH NODE, LEVEL 4, EXCISION:  - Lymph node negative for metastatic carcinoma.   C. LUNG, RIGHT UPPER LOBE, LOBECTOMY:  - Squamous cell carcinoma, 4.5 cm.  - Carcinoma extends through visceral pleura and focally involves  parietal pleura.  -  Surgical margins negative for carcinoma.  - One lymph node negative for carcinoma.  - See oncology table and comment.   D. SOFT TISSUE, CHEST WALL, BIOPSY:  - Fibroadipose tissue with focal inflammation and fibrosis.  - No carcinoma identified.   ONCOLOGY TABLE:  LUNG: Resection  Synchronous Tumors: Not applicable.  Total Number of Primary Tumors: 1  Procedure: Right upper lobectomy with lymph node biopsies and chest wall  biopsy.  Specimen Laterality: Right upper lobe.  Tumor Focality: Unifocal.  Tumor Site: Right upper lobe.  Tumor Size: 4.5 x 4 x 3 cm.  Histologic Type: Squamous cell carcinoma.  Visceral Pleura Invasion: Present.  Direct Invasion of Adjacent Structures: Focal involvement of parietal  pleura.  Lymphovascular Invasion: Not identified.  Margins: All surgical margins negative for carcinoma.  Regional Lymph Nodes:       Number of Lymph Nodes Involved: 0                            Nodal Sites with Tumor: 0       Number of Lymph Nodes Examined: 3                       Nodal Sites Examined: 2  Pathologic Stage Classification (pTNM, AJCC 8th Edition): pT3, pN0  Ancillary Studies: Can be performed if requested.   #Right upper lobe stage IIb-pT3 [not cisplatin candidate]; CarboTaxol x4. April 11th, 2023-Atezo q 3 w x1 year  # AUG 2023- IATROGENIC HYPOTHYRODISM- TSH 100; START SYNTHROID    Malignant neoplasm of upper lobe of right lung (HCC)  04/24/2021 Initial Diagnosis   Malignant neoplasm of  upper lobe of right lung (HCC)   05/02/2021 Cancer Staging   Staging form: Lung, AJCC 8th Edition - Clinical: Stage IB (cT2a, cN0, cM0) - Signed by Rennie Cindy SAUNDERS, MD on 05/02/2021 Stage prefix: Initial diagnosis   07/23/2021 Cancer Staging   Staging form: Lung, AJCC 8th Edition - Pathologic: Stage IIB (pT3, pN0, cM0) - Signed by Rennie Cindy SAUNDERS, MD on 07/23/2021 Stage prefix: Initial diagnosis    HISTORY OF PRESENTING ILLNESS:  Patient is with husband. in a  wheel chair.   Olivia Buchanan 73 y.o.  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   Patient  Chronic back pain from Scoliosis,that she reports is constant. She states she can no longer tolerate eating meats. Trying to get some protein in. Appetite has diminished. Has lost weight. she is also on mounjaro.  Pt having knee pain and L hip pain (arthritis). She is in a wheelchair today. Breathing is good. Denies cough or dyspnea. New denture implants has impacted on her eating,    Review of Systems  Constitutional:  Positive for malaise/fatigue and weight loss. Negative for chills, diaphoresis and fever.  HENT:  Negative for nosebleeds and sore throat.   Eyes:  Negative for double vision.  Respiratory:  Positive for cough and shortness of breath. Negative for hemoptysis and wheezing.   Cardiovascular:  Negative for chest pain, palpitations, orthopnea and leg swelling.  Gastrointestinal:  Negative for abdominal pain, blood in stool, constipation, diarrhea, heartburn, melena and vomiting.  Genitourinary:  Negative for dysuria, frequency and urgency.  Musculoskeletal:  Positive for back pain, joint pain and neck pain.  Skin: Negative.  Negative for itching and rash.  Neurological:  Negative for tingling, focal weakness and weakness.  Endo/Heme/Allergies:  Does not bruise/bleed easily.  Psychiatric/Behavioral:  Negative for depression. The patient is not nervous/anxious and does not have insomnia.      MEDICAL HISTORY:  Past Medical History:  Diagnosis Date   Angioedema 12/08/2019   Anxiety    Aortic atherosclerosis    Asthma    CAD (coronary artery disease)    3 vessel   Cancer (HCC)    COPD (chronic obstructive pulmonary disease) (HCC) 12/08/2019   DDD (degenerative disc disease), lumbar    Depression    Diabetes mellitus without complication (HCC)    no meds, diet  controlled   Dyspnea    GERD (gastroesophageal reflux disease)    HTN (hypertension) 12/08/2019   Hyperlipidemia    Hypertension    Mass of upper lobe of right lung 04/02/2021   spiculated mass RIGHT posterior pulmonary apex with associated hilar and pretracheal LAD; measured 3.2 x 2.8 cm   Obesity (BMI 30-39.9)    T2DM (type 2 diabetes mellitus) (HCC) 12/08/2019    SURGICAL HISTORY: Past Surgical History:  Procedure Laterality Date   BACK SURGERY     lumbar L5-S1 ruptured disc x 3 surgeries   CERVICAL FUSION  2009   FINE NEEDLE ASPIRATION  06/23/2021   Procedure: FINE NEEDLE ASPIRATION (FNA) LINEAR;  Surgeon: Brenna Adine CROME, DO;  Location: [facility];  Service: Pulmonary;;   INTERCOSTAL NERVE BLOCK Right 07/09/2021   Procedure: INTERCOSTAL NERVE BLOCK;  Surgeon: Shyrl Linnie KIDD, MD;  Location: [facility] OR;  Service: Thoracic;  Laterality: Right;   IR IMAGING GUIDED PORT INSERTION  08/06/2021   LAPAROSCOPIC TOTAL HYSTERECTOMY  1998   with oophorectomy   LYMPH NODE  DISSECTION Right 07/09/2021   Procedure: LYMPH NODE DISSECTION;  Surgeon: Shyrl Linnie KIDD, MD;  Location: [facility] OR;  Service: Thoracic;  Laterality: Right;   TONSILLECTOMY     VIDEO BRONCHOSCOPY WITH ENDOBRONCHIAL NAVIGATION N/A 04/15/2021   Procedure: VIDEO BRONCHOSCOPY WITH ENDOBRONCHIAL NAVIGATION;  Surgeon: Parris Manna, MD;  Location: [facility];  Service: Thoracic;  Laterality: N/A;   VIDEO BRONCHOSCOPY WITH ENDOBRONCHIAL ULTRASOUND N/A 04/15/2021   Procedure: VIDEO BRONCHOSCOPY WITH ENDOBRONCHIAL ULTRASOUND;  Surgeon: Parris Manna, MD;  Location: [facility] OR[facility];  Service: Thoracic;  Laterality: N/A;   VIDEO BRONCHOSCOPY WITH ENDOBRONCHIAL ULTRASOUND N/A 06/23/2021   Procedure: VIDEO BRONCHOSCOPY WITH ENDOBRONCHIAL ULTRASOUND;  Surgeon: Brenna Adine CROME, DO;  Location: [facility];  Service: Pulmonary;  Laterality: N/A;    SOCIAL HISTORY: Social History   Socioeconomic History   Marital status: Married    Spouse  name: Dwight   Number of children: 3   Years of education: Not on file   Highest education level: Not on file  Occupational History   Not on file  Tobacco Use   Smoking status: Former    Current packs/day: 0.00    Average packs/day: 3.0 packs/day for 50.0 years (150.0 ttl pk-yrs)    Types: Cigarettes    Start date: 10    Quit date: 2019    Years since quitting: 6.8   Smokeless tobacco: Never  Vaping Use   Vaping status: Never Used  Substance and Sexual Activity   Alcohol use: Not Currently   Drug use: Never   Sexual activity: Not on file    Comment: Hysterectomy  Other Topics Concern   Not on file  Social History Narrative   Lives in home; with husband; [place]; never smoking 2019; no alcohol. 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    Social Drivers of Health   Financial Resource Strain: Low Risk  (07/23/2024)   Received from [facility]   Overall Financial Resource Strain (CARDIA)    Difficulty of Paying Living Expenses: Not very hard  Food Insecurity: Food Insecurity Present (07/23/2024)   Received from [facility]   Hunger Vital Sign    Within the past 12 months, you worried that your food would run out before you got the money to buy more.: Sometimes true    Within the past 12 months, the food you bought just didn't last and you didn't have money to get more.: Never true  Transportation Needs: No Transportation Needs (07/23/2024)   Received from [facility] Transportation    In the past 12 months, has lack of transportation kept you from medical appointments or from getting medications?: No    Lack of Transportation (Non-Medical): No  Physical Activity: Not on file  Stress: Not on file  Social Connections: Not on file  Intimate Partner Violence: Not on file    FAMILY HISTORY: Family History  Problem Relation Age of Onset   Bone cancer Maternal Uncle      ALLERGIES:  is allergic to ibuprofen and macrobid [nitrofurantoin].  MEDICATIONS:  Current Outpatient Medications  Medication Sig Dispense Refill   albuterol  (VENTOLIN  HFA) 108 (90 Base) MCG/ACT inhaler Inhale 1 puff into the lungs every 6 (six) hours as needed for wheezing.     aspirin  EC 81 MG tablet Take 81 mg by mouth at bedtime.     clonazePAM  (KLONOPIN ) 0.5 MG tablet Take 0.5 mg by mouth in the morning, at noon, and  at bedtime.     diclofenac Sodium (VOLTAREN ARTHRITIS PAIN) 1 % GEL Apply topically 4 (four) times daily.     diphenoxylate-atropine (LOMOTIL) 2.5-0.025 MG tablet Take by mouth.     EPINEPHrine  0.3 mg/0.3 mL IJ SOAJ injection Inject 0.3 mLs (0.3 mg total) into the muscle as needed for anaphylaxis. 2 each 0   fluticasone-salmeterol (ADVAIR) 250-50 MCG/ACT AEPB Inhale 1 puff into the lungs in the morning and at bedtime.     gabapentin  (NEURONTIN ) 300 MG capsule Take 300 mg by mouth 3 (three) times daily. Take 300 mg qam and lunch and take 600 mg qhs     HYDROcodone -acetaminophen  (NORCO) 10-325 MG tablet Take 1 tablet by mouth every 6 (six) hours as needed.     ipratropium-albuterol  (DUONEB) 0.5-2.5 (3) MG/3ML SOLN Take 3 mLs by nebulization 3 (three) times daily.     levothyroxine  (SYNTHROID ) 125 MCG tablet TAKE 1 TABLET(125 MCG) BY MOUTH DAILY BEFORE BREAKFAST 90 tablet 1   lidocaine -prilocaine  (EMLA ) cream Use as directed prior to port being accessed 30 g 2   Melatonin 10 MG TABS Take 10 mg by mouth at bedtime.     MOUNJARO 2.5 MG/0.5ML Pen Inject 2.5 mg into the skin once a week.     Multiple Vitamin (MULTI-VITAMIN) tablet Take 1 tablet by mouth at bedtime.     nystatin cream (MYCOSTATIN) Apply 1 application topically 2 (two) times daily as needed for dry skin (around lips).     pantoprazole  (PROTONIX ) 40 MG tablet Take 40 mg by mouth at bedtime.     potassium chloride  SA (KLOR-CON ) 20 MEQ tablet Take 20 mEq by mouth 2 (two) times daily. (Patient taking differently:  Take 20 mEq by mouth daily.)     pravastatin  (PRAVACHOL ) 80 MG tablet Take 80 mg by mouth at bedtime.     prochlorperazine  (COMPAZINE ) 10 MG tablet TAKE 1 TABLET(10 MG) BY MOUTH EVERY 6 HOURS AS NEEDED FOR NAUSEA OR VOMITING 30 tablet 1   tiZANidine  (ZANAFLEX ) 4 MG tablet Take 4 mg by mouth 3 (three) times daily.     traZODone (DESYREL) 100 MG tablet Take 50 mg by mouth at bedtime.     triamcinolone  ointment (KENALOG ) 0.5 % Apply 1 Application topically 2 (two) times daily. 30 g 1   venlafaxine  XR (EFFEXOR -XR) 75 MG 24 hr capsule Take 150 mg by mouth at bedtime.     Vitamin D , Ergocalciferol , (DRISDOL ) 1.25 MG (50000 UNIT) CAPS capsule TAKE 1 CAPSULE BY MOUTH 1 TIME A WEEK 12 capsule 1   No current facility-administered medications for this visit.   Facility-Administered Medications Ordered in Other Visits  Medication Dose Route Frequency Provider Last Rate Last Admin   heparin  lock flush 100 UNIT/ML injection            sodium chloride  flush (NS) 0.9 % injection 10 mL  10 mL Intravenous Once Campbell Agramonte R, MD        PHYSICAL EXAMINATION: ECOG PERFORMANCE STATUS: 1 - Symptomatic but completely ambulatory  Vitals:   08/06/24 1513  BP: 93/60  Pulse: 80  Resp: 17  Temp: 97.8 F (36.6 C)  SpO2: 99%       Filed Weights   08/06/24 1513  Weight: 131 lb (59.4 kg)    Skin rash- see below  Physical Exam Vitals and nursing note reviewed.  HENT:     Head: Normocephalic and atraumatic.     Mouth/Throat:     Mouth: Mucous membranes are moist.  Pharynx: Oropharynx is clear. No oropharyngeal exudate.  Eyes:     Extraocular Movements: Extraocular movements intact.     Pupils: Pupils are equal, round, and reactive to light.  Cardiovascular:     Rate and Rhythm: Normal rate and regular rhythm.  Pulmonary:     Effort: No respiratory distress.     Breath sounds: No wheezing.     Comments: Decreased breath sounds bilaterally at bases.  No wheeze or crackles Abdominal:      General: Bowel sounds are normal. There is no distension.     Palpations: Abdomen is soft. There is no mass.     Tenderness: There is no abdominal tenderness. There is no guarding or rebound.  Musculoskeletal:        General: No tenderness. Normal range of motion.     Cervical back: Normal range of motion and neck supple.  Skin:    General: Skin is warm.  Neurological:     General: No focal deficit present.     Mental Status: She is alert and oriented to person, place, and time.     Comments: 4-5 strength in the left lower extremity/chronic.  Psychiatric:        Mood and Affect: Affect normal.        Behavior: Behavior normal.        Judgment: Judgment normal.       LABORATORY DATA:  I have reviewed the data as listed Lab Results  Component Value Date   WBC 3.9 (L) 08/06/2024   HGB 11.2 (L) 08/06/2024   HCT 32.9 (L) 08/06/2024   MCV 94.3 08/06/2024   PLT 121 (L) 08/06/2024   Recent Labs    12/28/23 1255 03/28/24 1500 08/06/24 1453  NA 139 136 136  K 4.1 4.3 3.7  CL 105 103 103  CO2 26 26 25   GLUCOSE 87 108* 87  BUN 9 12 10   CREATININE 0.67 0.76 0.60  CALCIUM 8.9 9.2 8.6*  GFRNONAA >60 >60 >60  PROT 7.1 7.5 7.0  ALBUMIN 3.7 3.8 3.6  AST 25 36 33  ALT 13 23 19   ALKPHOS 55 54 45  BILITOT 0.7 0.6 0.6    RADIOGRAPHIC STUDIES: I have personally reviewed the radiological images as listed and agreed with the findings in the report. NM PET Image Restage (PS) Skull Base to Thigh (F-18 FDG) Result Date: 07/30/2024 EXAM: PET AND CT SKULL BASE TO MID THIGH 07/27/2024 10:29:38 AM TECHNIQUE: RADIOPHARMACEUTICAL: 7.48 mCi F-18 FDG Uptake time 60 minutes. Glucose level 65 mg/dl. Background blood pool activity SUV 1.8. PET imaging was acquired from the base of the skull to the mid thighs. Non-contrast enhanced computed tomography was obtained for attenuation correction and anatomic localization. COMPARISON: 04/30/2021 and chest CT from 03/21/2024. CLINICAL HISTORY: cancer-  lung cancer- hilar adenopathy. 7.14mCi F18 FDG Given.; Blood Glucose: 65 mg/dl; NPO; DIABETIC; NOT TAKING ANTIBIOTICS/STEROIDS; NO RECENT CHEMO/RADIATION THERAPY; NO RECENT SURGERY/BIOPSY; RIGHT CHEST PORT FINDINGS: HEAD AND NECK: No metabolically active cervical lymphadenopathy. Bilateral common carotid atherosclerosis is noted. CHEST: No metabolically active pulmonary nodules. No metabolically active lymphadenopathy. There is a right upper lobectomy. Chronic scarring is present in the right middle lobe. The right port-a-cath tip is in the right atrium. Coronary, aortic, and branch vessel atheromatous vascular calcification is present. There is mild cardiomegaly. ABDOMEN AND PELVIS: No metabolically active intraperitoneal mass. No metabolically active lymphadenopathy. Physiologic activity within the gastrointestinal and genitourinary systems. Abdominal atherosclerosis involves the origins of the celiac trunk and SMA. An infrarenal  abdominal aortic aneurysm measures 3.4 cm in diameter on image 100 series 6, previously measuring 3.1 cm on 04/30/2021. According to the ACR 2013 and SVS 2018 guidelines, the finding is consistent with a small abdominal aortic aneurysm (3.4 cm), and the recommendation is surveillance imaging at a 3-year interval. BONES AND SOFT TISSUE: No abnormal FDG activity localizes to the bones. No metabolically active aggressive osseous lesion. Foci of accentuated activity along intervertebral spurring in the mid and lower lumbar spine favor degenerative activity. Esophageal activity without CT correlate is likely physiologic. Scattered muscular tendinous activity is likely physiologic. IMPRESSION: 1. No evidence of metabolically active disease. 2. Small infrarenal abdominal aortic aneurysm measuring 3.4 cm, increased from 3.1 cm on 04/30/2021, with recommendation for surveillance imaging at a 3-year interval. 3. Degenerative changes of the mid and lower lumbar spine consistent with intervertebral  osteophyte activity. 4. Atherosclerosis involving coronary arteries, aorta and branch vessels, bilateral common carotids, and abdominal vessels including the celiac trunk and superior mesenteric artery origins. 5. Mild cardiomegaly. Electronically signed by: Ryan Salvage MD 07/30/2024 01:30 PM EDT RP Workstation: [id number]    ASSESSMENT & PLAN:   Malignant neoplasm of upper lobe of right lung (HCC) # JULY 2022- pT3N0 [4.5cm; Squamous cell ca- + involvement of the parietal pleura]; stage II. Currently s/p adjuvant carbo-taxol  x4 [poor candidate for cisplatin].  PD-L1-5%; Currently  s/p  adjuvant Atezolizumab  x 13 cycles; discontinued sec to poor tolerance.   # OCT 24th, 2025- PET scan:No evidence of metabolically active disease. Continue surveillance at this time.-See below regarding surveillance.  # Hypokalemia: Off hydrochlorothiazide - currently On Kdur BID- 4.1- can take 1 pill a day-  stable.    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 continue-vit D 50,000-  stable.    # chronic back pain/chronic left LE/Bil PN-history of cervical spine injury-no evidence of metastatic disease.  Continue hydrocodone  [for now as per Dr. Auston.   stable.    # Iatrogenic hypo-hyperthyroidism- [AUG 2023]-TSH-100 mcg/day-; JAN 2024-TSH- LOW DEC 2023- synthoird- 100 mg/day-  TSH slowly improving. stable.    # Rash- on torso- ? Related to recent Bactrim use- recommend/called in Kenalog  ointment-; and anti-histamine; if not improved then recommend evaluation with Dr.Sparks for oral steroids if not improved.   # History of diabetes-OFF metformin  [sec to diarrhea] [facility] 2023-Hb A1c-Hb A1C-5.9]. on Monjauro/ weight loss-  stable.   # IV access: Mediport- functional. # port/IV access- Discussed re: pro and cons of keeping the port vs. Explantation.  Given no evidence of recurrence on PET scan recommend port explantation.  Will refer  to IR.  #Incidental findings on Imaging  CT , 2025: Small infrarenal abdominal aortic aneurysm measuring 3.4 cm, increased from 3.1 cm on 04/30/2021, with recommendation for surveillance imaging at a 3-year interval;  Degenerative changes of the mid and lower lumbar spine consistent with intervertebral osteophyte activity;  Atherosclerosis involving coronary arteries, aorta and branch vessels, bilateral common carotids, and abdominal vessels including the celiac trunk and superior mesenteric artery origins.  Mild cardiomegaly. I reviewed/discussed/counseled the patient.   # Given issues with insurance I think is reasonable for the patient to follow-up with PCP-patient needs CT scan every 6 months for the first 3 years and then annually.  So patient will need an annual imaging starting November 2026.  Discussed regarding referral to [facility] oncology, however patient is reluctant. Also given no evidence of disease on recent PET- CT scan I  think it is reasonable for the patient to follow-up with her PCP for surveillance imaging.  Patient comfortable with the plan; to call us  if any questions or concerns in the interim.   Chest-contrast-   # DISPOSITION:  # refer to IR re: port explantation-  # Follow up as needed-Dr.B       All questions were answered. The patient knows to call the clinic with any problems, questions or concerns.    Cindy JONELLE Joe, MD 08/06/2024 3:44 PM

## 2024-08-06 NOTE — Addendum Note (Signed)
 Addended by: JOSHUA ALFONSO CROME on: 08/06/2024 04:00 PM   Modules accepted: Orders

## 2024-08-06 NOTE — Progress Notes (Signed)
 Pt is here for follow up for her Lung Cancer. Had a recent PET scan. Chronic back pain from Scoliosis,that she reports is constant. She states she can no longer tolerate eating meats. Trying to get some protein in. Appetite has diminished. Has lost weight.

## 2024-08-06 NOTE — Assessment & Plan Note (Addendum)
#   JULY 2022- pT3N0 [4.5cm; Squamous cell ca- + involvement of the parietal pleura]; stage II. Currently s/p adjuvant carbo-taxol  x4 [poor candidate for cisplatin].  PD-L1-5%; Currently  s/p  adjuvant Atezolizumab  x 13 cycles; discontinued sec to poor tolerance.   # OCT 24th, 2025- PET scan:No evidence of metabolically active disease. Continue surveillance at this time.-See below regarding surveillance.  # Hypokalemia: Off hydrochlorothiazide - currently On Kdur BID- 4.1- can take 1 pill a day-  stable.    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 continue-vit D 50,000-  stable.    # chronic back pain/chronic left LE/Bil PN-history of cervical spine injury-no evidence of metastatic disease.  Continue hydrocodone  [for now as per Dr. Auston.   stable.    # Iatrogenic hypo-hyperthyroidism- [AUG 2023]-TSH-100 mcg/day-; JAN 2024-TSH- LOW DEC 2023- synthoird- 100 mg/day-  TSH slowly improving. stable.    # Rash- on torso- ? Related to recent Bactrim use- recommend/called in Kenalog  ointment-; and anti-histamine; if not improved then recommend evaluation with Dr.Sparks for oral steroids if not improved.   # History of diabetes-OFF metformin  [sec to diarrhea] [facility] 2023-Hb A1c-Hb A1C-5.9]. on Monjauro/ weight loss-  stable.   # IV access: Mediport- functional. # port/IV access- Discussed re: pro and cons of keeping the port vs. Explantation.  Given no evidence of recurrence on PET scan recommend port explantation.  Will refer to IR.  #Incidental findings on Imaging  CT , 2025: Small infrarenal abdominal aortic aneurysm measuring 3.4 cm, increased from 3.1 cm on 04/30/2021, with recommendation for surveillance imaging at a 3-year interval;  Degenerative changes of the mid and lower lumbar spine consistent with intervertebral osteophyte activity;  Atherosclerosis involving coronary arteries, aorta and branch vessels, bilateral  common carotids, and abdominal vessels including the celiac trunk and superior mesenteric artery origins.  Mild cardiomegaly. I reviewed/discussed/counseled the patient.   # Given issues with insurance I think is reasonable for the patient to follow-up with PCP-patient needs CT scan every 6 months for the first 3 years and then annually.  So patient will need an annual imaging starting November 2026.  Discussed regarding referral to [facility] oncology, however patient is reluctant. Also given no evidence of disease on recent PET- CT scan I think it is reasonable for the patient to follow-up with her PCP for surveillance imaging.  Patient comfortable with the plan; to call us  if any questions or concerns in the interim.   Chest-contrast-   # DISPOSITION:  # refer to IR re: port explantation-  # Follow up as needed-Dr.B

## 2024-08-07 LAB — THYROID PANEL WITH TSH
Free Thyroxine Index: 1.6 (ref 1.2–4.9)
T3 Uptake Ratio: 24 % (ref 24–39)
T4, Total: 6.7 ug/dL (ref 4.5–12.0)
TSH: 2.58 u[IU]/mL (ref 0.450–4.500)

## 2024-08-08 ENCOUNTER — Telehealth: Payer: Self-pay | Admitting: *Deleted

## 2024-08-08 NOTE — Telephone Encounter (Signed)
 RN spoke with patient who accepted port removal appointment at IR/Heart and vascular at [facility] on Friday, 08/10/24 at 2pm arrive at 130p.  Does not have to be NPO, can take medications and drive self after procedure. Pt verbalized understanding.

## 2024-08-09 NOTE — Progress Notes (Signed)
 Patient for IR Port Removal on Friday 08/10/24, I called and spoke with the patient on the phone and gave pre-procedure instructions. Pt was made aware to be here at 1:30p and check in at the H&V entrance. Pt stated understanding. Called 08/08/24

## 2024-08-10 ENCOUNTER — Ambulatory Visit
Admission: RE | Admit: 2024-08-10 | Discharge: 2024-08-10 | Disposition: A | Source: Ambulatory Visit | Attending: Internal Medicine | Admitting: Radiology

## 2024-08-10 NOTE — Progress Notes (Signed)
 Patient for IR Port Removal on Monday 08/13/24, I called and spoke with the patient on the phone and gave pre-procedure instructions. Pt was made aware to be here at 2:45p and check in at the H&V entrance Pt stated understanding. Called 08/10/24

## 2024-08-13 ENCOUNTER — Ambulatory Visit
Admission: RE | Admit: 2024-08-13 | Discharge: 2024-08-13 | Disposition: A | Discharge status: Home / Self Care | Source: Ambulatory Visit | Attending: Internal Medicine | Admitting: Internal Medicine

## 2024-08-13 DIAGNOSIS — C3411 Malignant neoplasm of upper lobe, right bronchus or lung: Secondary | ICD-10-CM

## 2024-08-13 DIAGNOSIS — Z452 Encounter for adjustment and management of vascular access device: Secondary | ICD-10-CM | POA: Insufficient documentation

## 2024-08-13 DIAGNOSIS — Z85118 Personal history of other malignant neoplasm of bronchus and lung: Secondary | ICD-10-CM | POA: Insufficient documentation

## 2024-08-13 HISTORY — PX: IR REMOVAL TUN ACCESS W/ PORT W/O FL MOD SED: IMG2290

## 2024-08-13 MED ORDER — LIDOCAINE-EPINEPHRINE 1 %-1:100000 IJ SOLN
20.0000 mL | Freq: Once | INTRAMUSCULAR | Status: AC
  Administered 2024-08-13: 10 mL via INTRADERMAL

## 2024-08-13 MED ORDER — LIDOCAINE-EPINEPHRINE 1 %-1:100000 IJ SOLN
INTRAMUSCULAR | Status: AC
  Filled 2024-08-13: qty 1

## 2024-08-20 ENCOUNTER — Other Ambulatory Visit

## 2024-08-20 ENCOUNTER — Other Ambulatory Visit: Payer: Self-pay

## 2024-08-20 ENCOUNTER — Inpatient Hospital Stay: Admission: RE | Admit: 2024-08-20 | Source: Ambulatory Visit

## 2024-10-20 ENCOUNTER — Other Ambulatory Visit: Payer: Self-pay | Admitting: Internal Medicine

## 2024-10-22 ENCOUNTER — Encounter: Payer: Self-pay | Admitting: Internal Medicine
